# Patient Record
Sex: Male | Born: 1950 | Race: White | Hispanic: No | Marital: Single | Smoking: Never smoker
Health system: Southern US, Community
[De-identification: ages and names within clinical notes are randomized; demographics above are authoritative.]

## PROBLEM LIST (undated history)

## (undated) DIAGNOSIS — E785 Hyperlipidemia, unspecified: Secondary | ICD-10-CM

## (undated) DIAGNOSIS — G252 Other specified forms of tremor: Secondary | ICD-10-CM

## (undated) DIAGNOSIS — F32A Depression, unspecified: Secondary | ICD-10-CM

## (undated) DIAGNOSIS — R251 Tremor, unspecified: Secondary | ICD-10-CM

## (undated) DIAGNOSIS — F419 Anxiety disorder, unspecified: Secondary | ICD-10-CM

## (undated) HISTORY — DX: Hyperlipidemia, unspecified: E78.5

## (undated) HISTORY — PX: FOOT TENDON SURGERY: SHX958

## (undated) HISTORY — DX: Other specified forms of tremor: G25.2

## (undated) HISTORY — DX: Anxiety disorder, unspecified: F41.9

## (undated) HISTORY — PX: APPENDECTOMY: SHX54

## (undated) HISTORY — DX: Tremor, unspecified: R25.1

## (undated) HISTORY — PX: TONSILLECTOMY: SUR1361

## (undated) HISTORY — DX: Depression, unspecified: F32.A

---

## 2012-10-26 DIAGNOSIS — G4700 Insomnia, unspecified: Secondary | ICD-10-CM | POA: Insufficient documentation

## 2012-12-03 DIAGNOSIS — I4510 Unspecified right bundle-branch block: Secondary | ICD-10-CM | POA: Insufficient documentation

## 2012-12-03 HISTORY — DX: Unspecified right bundle-branch block: I45.10

## 2012-12-22 DIAGNOSIS — R972 Elevated prostate specific antigen [PSA]: Secondary | ICD-10-CM | POA: Insufficient documentation

## 2012-12-22 HISTORY — DX: Elevated prostate specific antigen (PSA): R97.20

## 2013-08-15 DIAGNOSIS — I5189 Other ill-defined heart diseases: Secondary | ICD-10-CM

## 2013-08-15 DIAGNOSIS — I340 Nonrheumatic mitral (valve) insufficiency: Secondary | ICD-10-CM

## 2013-08-15 HISTORY — DX: Other ill-defined heart diseases: I51.89

## 2013-08-15 HISTORY — DX: Nonrheumatic mitral (valve) insufficiency: I34.0

## 2013-09-30 DIAGNOSIS — K5730 Diverticulosis of large intestine without perforation or abscess without bleeding: Secondary | ICD-10-CM

## 2013-09-30 HISTORY — DX: Diverticulosis of large intestine without perforation or abscess without bleeding: K57.30

## 2015-03-14 DIAGNOSIS — E538 Deficiency of other specified B group vitamins: Secondary | ICD-10-CM | POA: Insufficient documentation

## 2015-03-14 DIAGNOSIS — E559 Vitamin D deficiency, unspecified: Secondary | ICD-10-CM | POA: Insufficient documentation

## 2015-04-13 DIAGNOSIS — G4733 Obstructive sleep apnea (adult) (pediatric): Secondary | ICD-10-CM | POA: Insufficient documentation

## 2015-04-13 HISTORY — DX: Obstructive sleep apnea (adult) (pediatric): G47.33

## 2016-06-22 DIAGNOSIS — N200 Calculus of kidney: Secondary | ICD-10-CM | POA: Insufficient documentation

## 2016-06-22 HISTORY — DX: Calculus of kidney: N20.0

## 2017-08-31 DIAGNOSIS — M1710 Unilateral primary osteoarthritis, unspecified knee: Secondary | ICD-10-CM | POA: Insufficient documentation

## 2017-08-31 HISTORY — DX: Unilateral primary osteoarthritis, unspecified knee: M17.10

## 2018-12-15 DIAGNOSIS — E785 Hyperlipidemia, unspecified: Secondary | ICD-10-CM | POA: Insufficient documentation

## 2018-12-15 DIAGNOSIS — E7800 Pure hypercholesterolemia, unspecified: Secondary | ICD-10-CM | POA: Insufficient documentation

## 2018-12-15 HISTORY — DX: Hyperlipidemia, unspecified: E78.5

## 2018-12-15 HISTORY — DX: Pure hypercholesterolemia, unspecified: E78.00

## 2019-03-19 DIAGNOSIS — N1831 Chronic kidney disease, stage 3a: Secondary | ICD-10-CM | POA: Insufficient documentation

## 2019-03-19 DIAGNOSIS — N183 Chronic kidney disease, stage 3 unspecified: Secondary | ICD-10-CM | POA: Insufficient documentation

## 2019-03-19 HISTORY — DX: Chronic kidney disease, stage 3a: N18.31

## 2019-07-30 DIAGNOSIS — M160 Bilateral primary osteoarthritis of hip: Secondary | ICD-10-CM

## 2019-07-30 HISTORY — DX: Bilateral primary osteoarthritis of hip: M16.0

## 2019-11-22 ENCOUNTER — Telehealth: Payer: Self-pay | Admitting: Hematology and Oncology

## 2019-11-22 NOTE — Telephone Encounter (Signed)
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 Richard Wilson has been cld and scheduled to see Dr. Lorenso Courier on 11/18 at 9am. I sent a link to the pt's email to sign up for Mychart.

## 2019-12-19 NOTE — Progress Notes (Signed)
No show

## 2019-12-20 ENCOUNTER — Inpatient Hospital Stay: Payer: Medicare Other | Attending: Hematology and Oncology | Admitting: Hematology and Oncology

## 2019-12-20 ENCOUNTER — Inpatient Hospital Stay: Payer: Medicare Other

## 2019-12-20 DIAGNOSIS — E538 Deficiency of other specified B group vitamins: Secondary | ICD-10-CM

## 2019-12-20 DIAGNOSIS — D500 Iron deficiency anemia secondary to blood loss (chronic): Secondary | ICD-10-CM

## 2019-12-20 DIAGNOSIS — D751 Secondary polycythemia: Secondary | ICD-10-CM

## 2019-12-21 ENCOUNTER — Encounter: Payer: Self-pay | Admitting: Physician Assistant

## 2019-12-21 ENCOUNTER — Ambulatory Visit (INDEPENDENT_AMBULATORY_CARE_PROVIDER_SITE_OTHER): Payer: Medicare Other | Admitting: Physician Assistant

## 2019-12-21 ENCOUNTER — Other Ambulatory Visit: Payer: Self-pay

## 2019-12-21 DIAGNOSIS — F330 Major depressive disorder, recurrent, mild: Secondary | ICD-10-CM | POA: Diagnosis not present

## 2019-12-21 DIAGNOSIS — F411 Generalized anxiety disorder: Secondary | ICD-10-CM | POA: Diagnosis not present

## 2019-12-21 DIAGNOSIS — R6889 Other general symptoms and signs: Secondary | ICD-10-CM

## 2019-12-21 DIAGNOSIS — R37 Sexual dysfunction, unspecified: Secondary | ICD-10-CM | POA: Diagnosis not present

## 2019-12-21 MED ORDER — SERTRALINE HCL 25 MG PO TABS: ORAL_TABLET | ORAL | 0 refills | Status: DC

## 2019-12-21 NOTE — Patient Instructions (Signed)
On the Zoloft 25 mg, take 3 pills a day for 1 week then take 2 pills a day for 1 week then take 1 pill a day for 1 week and then stop

## 2019-12-21 NOTE — Progress Notes (Signed)
[facility] MD/PA/NP Initial Note  12/21/2019 6:06 PM Richard Wilson  MRN:  [id number]  Chief Complaint:  Chief Complaint    Establish Care      HPI:   He has recently moved to [facility] and is here to establish care.  He would like to streamline his medications as well.  Many of the psychiatric medications are once he has been on for a while and he does not even know what he takes them for.  So he is unsure if they helped or not.  He has a couple of different problems he would like help with.  First of all is the fact that he has very vivid dreams.  They are not really nightmares but sometimes he does not know where the dream ends and reality but tends when he wakes up.  If he is able to go back to sleep pretty fast, he goes right back into that same dream to finish it.  He is unable to tell me how long this has been going on, but states quite a while. Another problem is sexual dysfunction.  He has trouble with erection, and orgasm.  He is unsure if this is due to any other medications, his age, or both.  He has a longstanding history of anxiety and depression.  He has been on disability since 2001 for severe anxiety.  He has a history of suicide attempt on 2 separate occasions.  See below.  He has been hospitalized as well, for these incidents.    States he is able to enjoy things.  Energy and motivation are pretty good for him.  He does not sleep well but it is due to the dreams or at least he thinks so.  He does nap during the day.  Appetite is normal.  No reports of weight gain or weight loss.  He does not cry easily.  Not isolating.  At this time, he denies suicidal or homicidal thoughts.  He has never had any manic symptoms.  There have been a few times in his life where he was more energetic than his norm.  Those times were directly related with happy circumstances or something like that. Patient denies increased energy with decreased need for sleep, no increased talkativeness, no racing  thoughts, no impulsivity or risky behaviors, no increased spending, no increased libido, no grandiosity, no increased irritability or anger, and no hallucinations.  Visit Diagnosis:    ICD-10-CM   1. 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 Sexual dysfunction  R37   4. Vivid dream  R68.89     Past Psychiatric History:   Past medications for mental health diagnoses include: Rozerem, Trazodone caused vivid dreams, Xanax, Buspar  1996 tried to commit suicide with pills, admitted in [place], for a week. 2015 SI, hosp [facility], then psych hospital in [place]  He has seen Dr. Corene Cornea Master, Psychiatrist, in [place].  Past Medical History:  Past Medical History:  Diagnosis Date  . Anxiety   . Depression   . Tremor     Past Surgical History:  Procedure Laterality Date  . APPENDECTOMY    . TONSILLECTOMY      Family Psychiatric History: see below  Family History:  Family History  Problem Relation Age of Onset  . Healthy Mother   . Prostate cancer Father   . Throat cancer Maternal Grandfather   . Heart attack Paternal Grandfather  Social History:  Social History   Socioeconomic History  . Marital status: Unknown    Spouse name: Not on file  . Number of children: Not on file  . Years of education: Not on file  . Highest education level: Not on file  Occupational History  . Occupation: Disability    Comment: 2001 for GAD  . Occupation: Librarian  Tobacco Use  . Smoking status: Never Smoker  . Smokeless tobacco: Never Used  Substance and Sexual Activity  . Alcohol use: Yes    Alcohol/week: 3.0 standard drinks    Types: 3 Glasses of wine per week  . Drug use: Never  . Sexual activity: Not on file  Other Topics Concern  . Not on file  Social History Narrative   Grew up in [place]. Had a good childhood. Never abused.    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    Never married. No kids. Is gay.      No legal trouble.   [place]   Social Determinants of Health   Financial Resource Strain:   . Difficulty of Paying Living Expenses: Not on file  Food Insecurity:   . Worried About Charity fundraiser in the Last Year: Not on file  . Ran Out of Food in the Last Year: Not on file  Transportation Needs:   . Lack of Transportation (Medical): Not on file  . Lack of Transportation (Non-Medical): Not on file  Physical Activity:   . Days of Exercise per Week: Not on file  . Minutes of Exercise per Session: Not on file  Stress:   . Feeling of Stress : Not on file  Social Connections:   . Frequency of Communication with Friends and Family: Not on file  . Frequency of Social Gatherings with Friends and Family: Not on file  . Attends Religious Services: Not on file  . Active Member of Clubs or Organizations: Not on file  . Attends Archivist Meetings: Not on file  . Marital Status: Not on file    Allergies:  Allergies  Allergen Reactions  . Garamycin [Gentamicin] Other (See Comments)    Eyes itched and were edematous  . Penicillins     Metabolic Disorder Labs: No results found for: HGBA1C, MPG No results found for: PROLACTIN No results found for: CHOL, TRIG, HDL, CHOLHDL, VLDL, LDLCALC No results found for: TSH  Therapeutic Level Labs: No results found for: LITHIUM No results found for: VALPROATE No components found for:  CBMZ  Current Medications: Current Outpatient Medications  Medication Sig Dispense Refill  . ARIPiprazole (ABILIFY) 5 MG tablet Take 5 mg by mouth daily.    . clonazePAM (KLONOPIN) 1 MG tablet Take 1 mg by mouth 3 (three) times daily as needed for anxiety.    . lamoTRIgine (LAMICTAL) 100 MG tablet Take 100 mg by mouth daily.    . pravastatin (PRAVACHOL) 40 MG tablet Take 40 mg by mouth daily.    . propranolol (INDERAL) 20 MG tablet Take 20 mg by mouth 3 (three) times  daily as needed.    . ramelteon (ROZEREM) 8 MG tablet Take 8 mg by mouth at bedtime. (Patient not taking: Reported on 12/21/2019)    . sertraline (ZOLOFT) 25 MG tablet 3 po qd for 1 week, then 2 po qd for 1 week, then 1 po qd for 1 week, then stop. 42 tablet 0   No current facility-administered  medications for this visit.    Medication Side Effects: sexual dysfunction and vivid dreams  Orders placed this visit:  No orders of the defined types were placed in this encounter.   Psychiatric Specialty Exam:  Review of Systems  Constitutional: Negative.   HENT: Negative.   Eyes: Negative.   Respiratory: Negative.   Cardiovascular: Negative.   Gastrointestinal: Negative.   Endocrine: Negative.   Genitourinary: Negative.   Musculoskeletal: Positive for myalgias.  Skin: Negative.   Allergic/Immunologic: Negative.   Neurological: Positive for tremors.       Has been diagnosed with tremor in the past.  Cogentin has been helpful.  He is not sure what the tremor is caused from.  Hematological: Negative.   Psychiatric/Behavioral: Positive for sleep disturbance. The patient is nervous/anxious.     There were no vitals taken for this visit.There is no height or weight on file to calculate BMI.  General Appearance: Casual, Neat and Well Groomed  Eye Contact:  Good  Speech:  Clear and Coherent and Normal Rate  Volume:  Normal  Mood:  Euthymic  Affect:  Appropriate  Thought Process:  Goal Directed and Descriptions of Associations: Intact  Orientation:  Full (Time, Place, and Person)  Thought Content: Logical   Suicidal Thoughts:  No  Homicidal Thoughts:  No  Memory:  WNL  Judgement:  Good  Insight:  Good  Psychomotor Activity:  Normal  Concentration:  Concentration: Good  Recall:  Good  Fund of Knowledge: Good  Language: Good  Assets:  Desire for Improvement  ADL's:  Intact  Cognition: WNL  Prognosis:  Good   Screenings:  GAD-7     Office Visit from 12/21/2019 in [facility]  Total GAD-7 Score 8    PHQ2-9     Office Visit from 12/21/2019 in [facility]  PHQ-2 Total Score 1      Receiving Psychotherapy: Yes   Dr. Timmothy Sours Poag   Treatment Plan/Recommendations:  PDMP was reviewed. I provided 70 minutes of face-to-face time during this encounter. We discussed several different issues.  First of all, the Zoloft can cause the vivid dreams as well as sexual dysfunction.  Since he is not sure it is helping and is definitely contributing to these issues, he would like to get off of it.  He will need to wean off.  These directions were typed out on his AVS.  They will also be on the prescription bottle. He will sign a release of information today so I can get his previous psychiatrist notes. Sleep hygiene was discussed.  I recommend he take Klonopin before bedtime.  Avoid caffeine after lunch.  If after weaning off the Zoloft and having tried a Klonopin about 1 hour before bedtime he is still unable to sleep, call and I will send in a prescription for mirtazapine. Wean off Zoloft 25 mg, 3 p.o. daily for 1 week then 2 p.o. daily for 1 week then 1 p.o. daily for 1 week then stop. Continue Abilify 5 mg 1 every morning.  He may or may not need this and we will discuss weaning off that at the next visit.  I do not want to make too many changes at 1 time. Continue Klonopin 1 mg, 1 p.o. 3 times daily as needed anxiety. Continue Lamictal 100 mg, 1 p.o. daily. Continue propranolol 20 mg, 1 p.o. 3 times daily as needed, which he is using mostly for the tremor. If he does stay on Abilify, I will order  labs at the next visit. Continue therapy with Dr. Myra Gianotti.  Return in 6 weeks.  Donnal Moat, PA-C

## 2019-12-25 ENCOUNTER — Telehealth: Payer: Self-pay | Admitting: Physician Assistant

## 2019-12-25 NOTE — Telephone Encounter (Signed)
Pt called to let you know that the Klonopin 1mg  at bedtime does not work. Pt wants to know his options.

## 2019-12-25 NOTE — Telephone Encounter (Signed)
Please review

## 2019-12-26 ENCOUNTER — Other Ambulatory Visit: Payer: Self-pay | Admitting: Physician Assistant

## 2019-12-26 MED ORDER — MIRTAZAPINE 7.5 MG PO TABS
7.5000 mg | ORAL_TABLET | Freq: Every evening | ORAL | 0 refills | Status: DC | PRN
Start: 2019-12-26 — End: 2020-01-01

## 2019-12-26 NOTE — Telephone Encounter (Signed)
Please let him know that I sent in the mirtazapine as we had discussed at the office visit.  Sent to the [facility] on [address].  He should take it as needed.

## 2019-12-26 NOTE — Telephone Encounter (Signed)
Patient aware and actually just picked up Rx.

## 2020-01-01 ENCOUNTER — Other Ambulatory Visit: Payer: Self-pay

## 2020-01-01 ENCOUNTER — Encounter: Payer: Self-pay | Admitting: Physician Assistant

## 2020-01-01 ENCOUNTER — Ambulatory Visit (INDEPENDENT_AMBULATORY_CARE_PROVIDER_SITE_OTHER): Payer: Medicare Other | Admitting: Physician Assistant

## 2020-01-01 DIAGNOSIS — F411 Generalized anxiety disorder: Secondary | ICD-10-CM | POA: Insufficient documentation

## 2020-01-01 DIAGNOSIS — R37 Sexual dysfunction, unspecified: Secondary | ICD-10-CM | POA: Diagnosis not present

## 2020-01-01 DIAGNOSIS — R6889 Other general symptoms and signs: Secondary | ICD-10-CM

## 2020-01-01 DIAGNOSIS — F330 Major depressive disorder, recurrent, mild: Secondary | ICD-10-CM | POA: Insufficient documentation

## 2020-01-01 DIAGNOSIS — F329 Major depressive disorder, single episode, unspecified: Secondary | ICD-10-CM | POA: Insufficient documentation

## 2020-01-01 HISTORY — DX: Generalized anxiety disorder: F41.1

## 2020-01-01 HISTORY — DX: Major depressive disorder, single episode, unspecified: F32.9

## 2020-01-01 MED ORDER — SERTRALINE HCL 100 MG PO TABS: 150.0000 mg | ORAL_TABLET | Freq: Every day | ORAL | 0 refills | Status: DC

## 2020-01-01 MED ORDER — TRAZODONE HCL 150 MG PO TABS: 150.0000 mg | ORAL_TABLET | Freq: Every evening | ORAL | 0 refills | Status: DC | PRN

## 2020-01-01 NOTE — Progress Notes (Signed)
[facility] Med Check  Patient ID: Richard Wilson,  MRN: [id number]  PCP: Clare Charon., MD  Date of Evaluation: 01/01/2020 Time spent:30 minutes  Chief Complaint:  Chief Complaint    Anxiety; Depression; Insomnia      HISTORY/CURRENT STATUS: HPI Needs to discuss med issues.  At [place], we discussed going off Zoloft d/t sexual SE. "It's not worth it. I've felt awful." The he prefers to stay on the Zoloft even with the side effects of vivid dreams and sexual dysfunction.   The Zoloft has helped the depression.  He is able to enjoy things.  Energy and motivation are good.  He is not isolating.  Appetite is normal.  He is sleeping better since he went back on the trazodone that he had at home.  He was not able to tolerate the mirtazapine due to extreme fatigue and somnolence the next day.  Denies suicidal or homicidal thoughts.  He is still having some anxiety but it is really about the same.  He has taken Xanax in the past and states it did work quicker but not sure how long the improvement lasted.  He is not having panic attacks but more of generalized sense of anxiety.  He has been on a benzo since 2005.  Patient denies increased energy with decreased need for sleep, no increased talkativeness, no racing thoughts, no impulsivity or risky behaviors, no increased spending, no increased libido, no grandiosity, no increased irritability or anger, and no hallucinations.  Denies dizziness, syncope, seizures, numbness, tingling, tremor, tics, unsteady gait, slurred speech, confusion. Denies dizziness, syncope, seizures, numbness, tingling, tremor, tics, unsteady gait, slurred speech, confusion.   Individual Medical History/ Review of Systems: Changes? :No    Past medications for mental health diagnoses include: Rozerem, Trazodone caused vivid dreams, Xanax, Buspar, mirtazapine caused excessive somnolence and fatigue with flulike symptoms.  Allergies: Garamycin [gentamicin] and  Penicillins  Current Medications:  Current Outpatient Medications:    ARIPiprazole (ABILIFY) 5 MG tablet, Take 5 mg by mouth daily., Disp: , Rfl:    clonazePAM (KLONOPIN) 1 MG tablet, Take 1 mg by mouth 3 (three) times daily as needed for anxiety., Disp: , Rfl:    lamoTRIgine (LAMICTAL) 100 MG tablet, Take 100 mg by mouth daily., Disp: , Rfl:    propranolol (INDERAL) 20 MG tablet, Take 20 mg by mouth 3 (three) times daily as needed., Disp: , Rfl:    pravastatin (PRAVACHOL) 40 MG tablet, Take 40 mg by mouth daily. (Patient not taking: Reported on 01/01/2020), Disp: , Rfl:    ramelteon (ROZEREM) 8 MG tablet, Take 8 mg by mouth at bedtime. (Patient not taking: Reported on 12/21/2019), Disp: , Rfl:    sertraline (ZOLOFT) 100 MG tablet, Take 1.5 tablets (150 mg total) by mouth daily., Disp: 135 tablet, Rfl: 0   traZODone (DESYREL) 150 MG tablet, Take 1 tablet (150 mg total) by mouth at bedtime as needed for sleep., Disp: 90 tablet, Rfl: 0 Medication Side Effects: hypersomnolence and fatigue and flu-like sx  Family Medical/ Social History: Changes?  No  MENTAL HEALTH EXAM:  There were no vitals taken for this visit.There is no height or weight on file to calculate BMI.  General Appearance: Casual, Neat and Well Groomed  Eye Contact:  Good  Speech:  Clear and Coherent and Normal Rate  Volume:  Normal  Mood:  Euthymic  Affect:  Appropriate  Thought Process:  Goal Directed and Descriptions of Associations: Intact  Orientation:  Full (Time, Place, and Person)  Thought Content: Logical   Suicidal Thoughts:  No  Homicidal Thoughts:  No  Memory:  WNL  Judgement:  Good  Insight:  Good  Psychomotor Activity:  Normal  Concentration:  Concentration: Good  Recall:  Good  Fund of Knowledge: Good  Language: Good  Assets:  Desire for Improvement  ADL's:  Intact  Cognition: WNL  Prognosis:  Good    DIAGNOSES:    ICD-10-CM   1. Generalized anxiety disorder  F41.1   2. Mild episode of  recurrent major depressive disorder (HCC)  F33.0   3. Sexual dysfunction  R37   4. Vivid dream  R68.89     Receiving Psychotherapy: Yes  He is considering changing.  Now seeing Dr. Timmothy Sours Poag who he has only seen a few times.   RECOMMENDATIONS:  PDMP reviewed.  I provided 30 minutes of face-to-face time during this encounter. We discussed changing Klonopin to Xanax.  He is a bit nervous to do that.  At one point in the past, someone wean him off the Klonopin, but did not add another benzo.  He feels like he was weaned too fast.  He had shakes, felt extremely nervous inside, and felt horrible for several months.  He understands that if we changed to Xanax it will be okay to stop the Klonopin and go directly to the Xanax.  Xanax is faster acting but the effects do not last as long as Klonopin.  We do need to consider changing because the Klonopin is not as effective as it used to be.  However I only want to make 1 change at a time.  I think it would be more beneficial to increase the Zoloft to help prevent the anxiety.  He would like to try that.  Of course he understands that any benzo can increase the risk of falls in the elderly.  Because he has been on a benzo for 16 years at least, I do not think it will be possible to wean him off, at least not for now. Increase Zoloft to 150 mg daily. Continue Klonopin 1 mg, 1 p.o. 3 times daily as needed. Continue Abilify 5 mg every morning. Continue Lamictal 100 mg, 1 p.o. daily. Continue trazodone 150 mg, 1 p.o. nightly as needed. Continue therapy either with Dr. Myra Gianotti or Dr. Junie Bame was recommended and he does accept Medicare to my knowledge. Return in 4 weeks.  Donnal Moat, PA-C

## 2020-01-14 ENCOUNTER — Ambulatory Visit: Payer: Medicare Other | Admitting: Mental Health

## 2020-01-14 ENCOUNTER — Telehealth: Payer: Self-pay | Admitting: Physician Assistant

## 2020-01-14 NOTE — Telephone Encounter (Signed)
Patient's next visit is 02/04/20. Would like to go back on Xanax as this works better for his anxiety. Call to [facility] at [address], [place], [place]. [phone].

## 2020-01-15 ENCOUNTER — Telehealth: Payer: Self-pay | Admitting: Physician Assistant

## 2020-01-15 NOTE — Telephone Encounter (Signed)
Richard Wilson called back this morning to follow up on call from yesterday.  He is wanting to know your opinion on what he is asking about the Xanax.  And if you agree.  Please let him know if you think this would be helpful for him.

## 2020-01-15 NOTE — Telephone Encounter (Signed)
Would you mind calling him, I'll send in Xanax but he will need to bring the remainder of bottle of Klonopin before I can. We have to dispose of it properly. Thanks.

## 2020-01-16 ENCOUNTER — Other Ambulatory Visit: Payer: Self-pay | Admitting: Physician Assistant

## 2020-01-16 MED ORDER — ALPRAZOLAM 1 MG PO TABS: 1.0000 mg | ORAL_TABLET | Freq: Three times a day (TID) | ORAL | 0 refills | Status: DC | PRN

## 2020-01-16 NOTE — Telephone Encounter (Signed)
He did bring the bottle of Klonopin for [place] to dispose of properly.  Prescription for Xanax was sent to his pharmacy.

## 2020-01-16 NOTE — Telephone Encounter (Signed)
See previous phone note.  

## 2020-01-17 ENCOUNTER — Ambulatory Visit: Payer: Medicare Other | Admitting: Psychiatry

## 2020-01-17 NOTE — Progress Notes (Signed)
Assessment/Plan:    1.Parkinsonism  -I had a long counseling session with the patient today.  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  I explained that one clinically cannot tell the difference between idiopathic parkinsons disease and secondary parkinsonism from medication.  I also explained that even if one is able to get off of the medication, it can take up to 6 months to clinically definitively know if this is idiopathic parkinsons disease.  I did not advise that the patient go off of medication, as this needs to be discussed with the patients prescribing clinician.  I did, however, tell the patient that the longer one is on the medication, the worse the symptoms can get.  The patient is to make an appointment with [facility] to discuss what I have discussed with him.   Subjective:   Richard Wilson was seen today in the movement disorders clinic for neurologic consultation at the request of Johna Roles, [place].  The consultation is for the evaluation of intention tremor and cogwheel rigidity.  Records indicate that referring clinician suspected Parkinsons Disease vs chronic benzo use.   Specific Symptoms:  Tremor: Yes.   x 2-3 years.  Pt attributes to being taken off of klonopin abruptly - started back on klonopin without help and just recently the PA from behavioral med changed him to xanax, 1 mg tid.  Tremor is in the bilateral UE, R>L - both at rest and with activation.  Definitely notes it with pouring something from jug/pitcher.  Some tremor of the R leg when driving. Family hx of similar:  Yes.  , cousin with PD Voice: no but seems to cough a lot Sleep: sleeps well  Vivid Dreams:  Yes.    Acting out dreams:  No. Wet Pillows: No. Postural symptoms:  No.  Falls?  Fell Saturday after tripping on a shoe Bradykinesia symptoms: shuffling gait and difficulty regaining balance Loss of smell:  No. Loss of taste:  No. Urinary  Incontinence:  No. Difficulty Swallowing:  No. Handwriting, micrographia: Yes.   Trouble with ADL's:  No.  Trouble buttoning clothing: Yes.   , some trouble d/t dexterity Depression:  No. but admits to anxiety Memory changes:  Yes.   Hallucinations:  No.  visual distortions: Yes.  , occasional  N/V:  No. Lightheaded:  No.  Syncope: No. Diplopia:  No. Dyskinesia:  No. Prior exposure to reglan/antipsychotics: Yes.  , on abilify x 4-5 years.  Appears that he has been on cogentin in the past per records for tx of tremor  Neuroimaging of the brain has not previously been performed.     ALLERGIES:   Allergies  Allergen Reactions  . Garamycin [Gentamicin] Other (See Comments)    Eyes itched and were edematous  . Penicillins     CURRENT MEDICATIONS:  Current Outpatient Medications  Medication Instructions  . ALPRAZolam (XANAX) 1 mg, Oral, 3 times daily PRN  . ARIPiprazole (ABILIFY) 5 mg, Oral, Daily  . lamoTRIgine (LAMICTAL) 100 mg, Oral, Daily  . pravastatin (PRAVACHOL) 40 mg, Oral, Daily  . Probiotic Product (PROBIOTIC-10 PO) 1 tablet, Oral, Daily  . sertraline (ZOLOFT) 150 mg, Oral, Daily  . traZODone (DESYREL) 150 mg, Oral, At bedtime PRN    Objective:   VITALS:   Vitals:   01/21/20 0923  BP: 104/74  Pulse: 70  SpO2: 98%  Weight: 196 lb (88.9 kg)  Height: 5\' 9"  (1.753 m)    GEN:  The patient appears stated age and is in NAD. HEENT:  Normocephalic, atraumatic.  The mucous membranes are moist. The superficial temporal arteries are without ropiness or tenderness. CV:  RRR Lungs:  CTAB Neck/HEME:  There are no carotid bruits bilaterally.  Neurological examination:  Orientation: The patient is alert and oriented x3.  Cranial nerves: There is good facial symmetry. Extraocular muscles are intact. The visual fields are full to confrontational testing. The speech is fluent and clear. Soft palate rises symmetrically and there is no tongue deviation. Hearing is intact to  conversational tone. Sensation: Sensation is intact to light and pinprick throughout (facial, trunk, extremities). Vibration is intact at the bilateral big toe. There is no extinction with double simultaneous stimulation. There is no sensory dermatomal level identified. Motor: Strength is 5/5 in the bilateral upper and lower extremities.   Shoulder shrug is equal and symmetric.  There is no pronator drift. Deep tendon reflexes: Deep tendon reflexes are 2-/4 at the bilateral biceps, triceps, brachioradialis, patella and achilles. Plantar responses are downgoing bilaterally.  Movement examination: Tone: There is nltone in the bilateral upper extremities.  The tone in the lower extremities is nl.  Abnormal movements: there is bilateral lower extremity rest tremor, L>R that increases with distraction Coordination:  There is  decremation with RAM's, with finger taps on the R and toe and heel taps on the L Gait and Station: The patient has no difficulty arising out of a deep-seated chair without the use of the hands. The patient's stride length is good.    Total time spent on today's visit was 45 minutes, including both face-to-face time and nonface-to-face time.  Time included that spent on review of records (prior notes available to me/labs/imaging if pertinent), discussing treatment and goals, answering patient's questions and coordinating care.  Cc:  [name], [name], [place]

## 2020-01-21 ENCOUNTER — Ambulatory Visit (INDEPENDENT_AMBULATORY_CARE_PROVIDER_SITE_OTHER): Payer: Medicare Other | Admitting: Neurology

## 2020-01-21 ENCOUNTER — Other Ambulatory Visit: Payer: Self-pay

## 2020-01-21 ENCOUNTER — Encounter: Payer: Self-pay | Admitting: Neurology

## 2020-01-21 VITALS — BP 104/74 | HR 70 | Ht 69.0 in | Wt 196.0 lb

## 2020-01-21 DIAGNOSIS — G2111 Neuroleptic induced parkinsonism: Secondary | ICD-10-CM

## 2020-01-21 NOTE — Patient Instructions (Signed)
Make an appointment with [facility] to discuss whether or not there are alternatives to your abilify.  I will send them a note today.  Happy Holidays!  The physicians and staff at [facility] are committed to providing excellent care. You may receive a survey requesting feedback about your experience at our office. We strive to receive "very good" responses to the survey questions. If you feel that your experience would prevent you from giving the office a "very good " response, please contact our office to try to remedy the situation. We may be reached at [phone]. Thank you for taking the time out of your busy day to complete the survey.

## 2020-01-22 ENCOUNTER — Ambulatory Visit: Payer: Medicare Other | Admitting: Mental Health

## 2020-01-30 ENCOUNTER — Telehealth: Payer: Self-pay | Admitting: Physician Assistant

## 2020-01-30 NOTE — Telephone Encounter (Signed)
Pt LM on VM stating he would like to increase the mg on Xanax or take more often during the day. Contact # [phone]. Apt 1/3

## 2020-01-31 NOTE — Telephone Encounter (Signed)
Pt called checking status on message left on v-mail 12/29. Pt has apt Monday 1/3. Pt was advised, I will touch base with provider and advise if provider would like to discuss @ f/u 1/3. Please advise, so I can advise Pt.

## 2020-01-31 NOTE — Telephone Encounter (Signed)
ok 

## 2020-01-31 NOTE — Telephone Encounter (Signed)
Provider will discuss @ apt 1/3. Pt advised

## 2020-01-31 NOTE — Telephone Encounter (Signed)
Please let him know I would prefer to discuss this with him at his appointment in a few days because he is already on a high dose of Xanax. Thanks.

## 2020-02-04 ENCOUNTER — Other Ambulatory Visit: Payer: Self-pay

## 2020-02-04 ENCOUNTER — Ambulatory Visit (INDEPENDENT_AMBULATORY_CARE_PROVIDER_SITE_OTHER): Payer: Medicare Other | Admitting: Physician Assistant

## 2020-02-04 ENCOUNTER — Encounter: Payer: Self-pay | Admitting: Physician Assistant

## 2020-02-04 DIAGNOSIS — G251 Drug-induced tremor: Secondary | ICD-10-CM

## 2020-02-04 DIAGNOSIS — F411 Generalized anxiety disorder: Secondary | ICD-10-CM | POA: Diagnosis not present

## 2020-02-04 DIAGNOSIS — F330 Major depressive disorder, recurrent, mild: Secondary | ICD-10-CM

## 2020-02-04 DIAGNOSIS — R37 Sexual dysfunction, unspecified: Secondary | ICD-10-CM | POA: Diagnosis not present

## 2020-02-04 MED ORDER — SERTRALINE HCL 100 MG PO TABS: 200.0000 mg | ORAL_TABLET | Freq: Every day | ORAL | 0 refills | Status: DC

## 2020-02-04 MED ORDER — CLONAZEPAM 1 MG PO TABS: 1.0000 mg | ORAL_TABLET | Freq: Three times a day (TID) | ORAL | 1 refills | Status: DC | PRN

## 2020-02-04 MED ORDER — TRAZODONE HCL 150 MG PO TABS: 75.0000 mg | ORAL_TABLET | Freq: Every evening | ORAL | 0 refills | Status: DC | PRN

## 2020-02-04 NOTE — Progress Notes (Unsigned)
[facility]  Patient ID: Richard Wilson,  MRN: [id number]  PCP: Delma Officer, PA  Date of Evaluation: 02/04/2020 Time spent:40 minutes  Chief Complaint:  Chief Complaint    Anxiety; Depression; Insomnia      HISTORY/CURRENT STATUS: HPI For routine med check.  Since LOV, he saw Dr. Arbutus Leas, neurologist, for the tremor. She feels that the tremor is from the Abilify. He states the Abilify has helped him a lot and doesn't want to go off it. "I can deal with the tremor. It doesn't bother me that bad."  Has been a bit more anxious lately.  He does not feel like the Xanax is helping nearly as much as the Klonopin did.  It wears off a lot sooner.  Wonders if we can change back to the Klonopin.  He is not really having panic attacks but he gets very jittery and nervous feeling inside when he does not take the Xanax.  He can tell when it wears off and he is taking it routinely 3 times a day.  Also depressed too. He's terribly lonely. Alone all of the time. Has changed therapists and it's going well. Enjoys talking with the new therapist very much. He's able to enjoy things when he has something to do, energy and motivation are good, memory and focus are good and at his baseline, doesn't cry easily, not purposely isolating, no SI/HI.   Patient denies increased energy with decreased need for sleep, no increased talkativeness, no racing thoughts, no impulsivity or risky behaviors, no increased spending, no increased libido, no grandiosity, no increased irritability or anger, and no hallucinations.  Denies dizziness, syncope, seizures, numbness, tingling, tremor, tics, unsteady gait, slurred speech, confusion. Denies dizziness, syncope, seizures, numbness, tingling, tremor, tics, unsteady gait, slurred speech, confusion.   Individual Medical History/ Review of Systems: Changes? :No    Past medications for mental health diagnoses include: Rozerem, Trazodone caused vivid dreams, Xanax,  Buspar, mirtazapine caused excessive somnolence and fatigue with flulike symptoms, Belsomra, propranolol for tremor, Zoloft, Abilify, Klonopin, Effexor, Ativan, Wellbutrin, Nardil, lithium, gabapentin, Librium  Pertinent info after reviewing Dr. Marjie Skiff notes, his previous psychiatrist in [facility] Past suicide attempt in September 1996, by taking Tylenol.  He was admitted at that time to a psychiatric hospital in [facility].  Admitted to psych hospital multiple times in the past for "nervous breakdowns."  January 1985, February 1986, October 1991, September 1996, July 2000.  Allergies: Garamycin [gentamicin] and Penicillins  Current Medications:  Current Outpatient Medications:  .  ARIPiprazole (ABILIFY) 5 MG tablet, Take 5 mg by mouth daily., Disp: , Rfl:  .  clonazePAM (KLONOPIN) 1 MG tablet, Take 1 tablet (1 mg total) by mouth 3 (three) times daily as needed for anxiety., Disp: 90 tablet, Rfl: 1 .  lamoTRIgine (LAMICTAL) 100 MG tablet, Take 100 mg by mouth daily., Disp: , Rfl:  .  pravastatin (PRAVACHOL) 40 MG tablet, Take 40 mg by mouth daily. (Patient not taking: Reported on 02/04/2020), Disp: , Rfl:  .  Probiotic Product (PROBIOTIC-10 PO), Take 1 tablet by mouth daily. (Patient not taking: Reported on 02/04/2020), Disp: , Rfl:  .  sertraline (ZOLOFT) 100 MG tablet, Take 2 tablets (200 mg total) by mouth daily., Disp: 180 tablet, Rfl: 0 .  traZODone (DESYREL) 150 MG tablet, Take 0.5 tablets (75 mg total) by mouth at bedtime as needed for sleep., Disp: 90 tablet, Rfl: 0 Medication Side Effects: none  Family Medical/ Social History: Changes?  No  MENTAL HEALTH EXAM:  There were no vitals taken for this visit.There is no height or weight on file to calculate BMI.  General Appearance: Casual, Neat and Well Groomed  Eye Contact:  Good  Speech:  Clear and Coherent and Normal Rate  Volume:  Normal  Mood:  Euthymic  Affect:  Appropriate  Thought Process:   Goal Directed and Descriptions of Associations: Intact  Orientation:  Full (Time, Place, and Person)  Thought Content: Logical   Suicidal Thoughts:  No  Homicidal Thoughts:  No  Memory:  WNL  Judgement:  Good  Insight:  Good  Psychomotor Activity:  Tremor and very mild bilat hand tremor of outstretched hands. Right (dominant hand) sl worse than left.   Concentration:  Concentration: Good and Attention Span: Good  Recall:  Good  Fund of Knowledge: Good  Language: Good  Assets:  Desire for Improvement  ADL's:  Intact  Cognition: WNL  Prognosis:  Good    DIAGNOSES:    ICD-10-CM   1. 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 Sexual dysfunction  R37   4. 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 Ronda Fairly, [facility] in [place].    RECOMMENDATIONS:  PDMP reviewed.  I provided 40 minutes of face-to-face time during this encounter including time spent before visit reviewing records from previous psychiatrist, Dr. Lorraine Lax (see on chart). We also discussed the anxiety, depression, ED. Due to depression and uncontrolled anxiety, recommend increase the Zoloft, even with the ED. He understands and would like to increase the dose. He brought back the remainder of the xanax, count is correct, and bottle given to Larina Earthly, Glass blower/designer, to dispose of properly. Will change back to Klonopin b/c it lasts longer and is more effective.  Increase Zoloft to 200 mg daily. D/c Xanax.  Restart Klonopin 1 mg, 1 p.o. 3 times daily as needed. Continue Abilify 5 mg every morning. Continue Lamictal 100 mg, 1 p.o. daily. Continue trazodone 150 mg, 1/2 po qhs prn sleep. Continue therapy with Dr. Ronda Fairly. Return in 4-6 weeks  Donnal Moat, PA-C

## 2020-02-20 ENCOUNTER — Ambulatory Visit
Admission: RE | Admit: 2020-02-20 | Discharge: 2020-02-20 | Disposition: A | Discharge status: Home / Self Care | Payer: Medicare Other | Source: Ambulatory Visit | Attending: Physician Assistant | Admitting: Physician Assistant

## 2020-02-20 ENCOUNTER — Other Ambulatory Visit: Payer: Self-pay | Admitting: Physician Assistant

## 2020-02-20 DIAGNOSIS — M25551 Pain in right hip: Secondary | ICD-10-CM

## 2020-03-19 ENCOUNTER — Ambulatory Visit (INDEPENDENT_AMBULATORY_CARE_PROVIDER_SITE_OTHER): Payer: Medicare Other | Admitting: Physician Assistant

## 2020-03-19 ENCOUNTER — Other Ambulatory Visit: Payer: Self-pay

## 2020-03-19 ENCOUNTER — Encounter: Payer: Self-pay | Admitting: Physician Assistant

## 2020-03-19 DIAGNOSIS — F330 Major depressive disorder, recurrent, mild: Secondary | ICD-10-CM | POA: Diagnosis not present

## 2020-03-19 DIAGNOSIS — F411 Generalized anxiety disorder: Secondary | ICD-10-CM

## 2020-03-19 DIAGNOSIS — R37 Sexual dysfunction, unspecified: Secondary | ICD-10-CM

## 2020-03-19 MED ORDER — ARIPIPRAZOLE 5 MG PO TABS: 5.0000 mg | ORAL_TABLET | Freq: Every day | ORAL | 1 refills | Status: DC

## 2020-03-19 MED ORDER — TRAZODONE HCL 150 MG PO TABS: 75.0000 mg | ORAL_TABLET | Freq: Every evening | ORAL | 1 refills | Status: DC | PRN

## 2020-03-19 MED ORDER — CLONAZEPAM 1 MG PO TABS: 1.0000 mg | ORAL_TABLET | Freq: Three times a day (TID) | ORAL | 2 refills | Status: DC | PRN

## 2020-03-19 NOTE — Progress Notes (Signed)
[facility]  Patient ID: Richard Wilson,  MRN: [id number]  PCP: Johna Roles, PA  Date of Evaluation: 03/19/2020 Time spent:20 minutes  Chief Complaint:  Chief Complaint    Insomnia; Anxiety; Depression; Follow-up      HISTORY/CURRENT STATUS: For routine med check.  Is doing some better 'in some areas' feels more hopeful and has more purpose by getting more involved in church.  Is enjoying things more overall, but this can be a difficult time of year for him.  Energy and motivation are fair to good.  Not isolating as much.  Does not cry easily.  Denies suicidal or homicidal thoughts.  Patient denies increased energy with decreased need for sleep, no increased talkativeness, no racing thoughts, no impulsivity or risky behaviors, no increased spending, no increased libido, no grandiosity, no increased irritability or anger, and no hallucinations.  Denies dizziness, syncope, seizures, numbness, tingling, tremor, tics, unsteady gait, slurred speech, confusion. Denies dizziness, syncope, seizures, numbness, tingling, tremor, tics, unsteady gait, slurred speech, confusion.   Individual Medical History/ Review of Systems: Changes? :No    Past medications for mental health diagnoses include: Rozerem, Trazodone caused vivid dreams, Xanax, Buspar, mirtazapine caused excessive somnolence and fatigue with flulike symptoms, Belsomra, propranolol for tremor, Zoloft, Abilify, Klonopin, Effexor, Ativan, Wellbutrin, Nardil, lithium, gabapentin, Librium  Pertinent info after reviewing Dr. Evelena Leyden notes, his previous psychiatrist in [facility]. Past suicide attempt in September 1996, by taking Tylenol.  He was admitted at that time to a psychiatric hospital in [facility].  Admitted to psych hospital multiple times in the past for "nervous breakdowns."  January 1985, February 1986, October 1991, September 1996, July 2000.  Allergies: Garamycin  [gentamicin] and Penicillins  Current Medications:  Current Outpatient Medications:  .  lamoTRIgine (LAMICTAL) 100 MG tablet, Take 100 mg by mouth daily., Disp: , Rfl:  .  sertraline (ZOLOFT) 100 MG tablet, Take 2 tablets (200 mg total) by mouth daily., Disp: 180 tablet, Rfl: 0 .  ARIPiprazole (ABILIFY) 5 MG tablet, Take 1 tablet (5 mg total) by mouth daily., Disp: 90 tablet, Rfl: 1 .  clonazePAM (KLONOPIN) 1 MG tablet, Take 1 tablet (1 mg total) by mouth 3 (three) times daily as needed for anxiety., Disp: 90 tablet, Rfl: 2 .  pravastatin (PRAVACHOL) 40 MG tablet, Take 40 mg by mouth daily. (Patient not taking: No sig reported), Disp: , Rfl:  .  Probiotic Product (PROBIOTIC-10 PO), Take 1 tablet by mouth daily. (Patient not taking: No sig reported), Disp: , Rfl:  .  traZODone (DESYREL) 150 MG tablet, Take 0.5 tablets (75 mg total) by mouth at bedtime as needed for sleep., Disp: 90 tablet, Rfl: 1 Medication Side Effects: none  Family Medical/ Social History: Changes?  No  MENTAL HEALTH EXAM:  There were no vitals taken for this visit.There is no height or weight on file to calculate BMI.  General Appearance: Casual, Neat and Well Groomed  Eye Contact:  Good  Speech:  Clear and Coherent and Normal Rate  Volume:  Normal  Mood:  Euthymic  Affect:  Appropriate  Thought Process:  Goal Directed and Descriptions of Associations: Intact  Orientation:  Full (Time, Place, and Person)  Thought Content: Logical   Suicidal Thoughts:  No  Homicidal Thoughts:  No  Memory:  WNL  Judgement:  Good  Insight:  Good  Psychomotor Activity:  Normal  Concentration:  Concentration: Good and Attention Span: Good  Recall:  Good  Fund of Knowledge: Good  Language: Good  Assets:  Desire for Improvement  ADL's:  Intact  Cognition: WNL  Prognosis:  Good    DIAGNOSES:    ICD-10-CM   1. 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 Sexual  dysfunction  R37     Receiving Psychotherapy: Yes     RECOMMENDATIONS:  PDMP reviewed.  I provided 20 minutes of face-to-face time during this encounter.  We discussed medications and doses.  Although were not changing anything, I think we should see each other sooner rather than later because this time of year can be hard for him. Cont Zoloft 200 mg daily. Cont Klonopin 1 mg, 1 p.o. 3 times daily as needed. Continue Abilify 5 mg every morning. Continue Lamictal 100 mg, 1 p.o. daily. Continue trazodone 150 mg, 1/2 po qhs prn sleep. Continue therapy. Return in 6 weeks  Donnal Moat, PA-C

## 2020-04-03 ENCOUNTER — Telehealth: Payer: Self-pay | Admitting: Hematology and Oncology

## 2020-04-03 NOTE — Telephone Encounter (Signed)
Received a new hem referral from [place] at [facility] for polycthemia. Mr. Richard Wilson has been cld and scheduled to see Dr. Alvy Bimler on 3/8 at 1pm. Pt aware to arrive 30 minutes early.

## 2020-04-04 ENCOUNTER — Other Ambulatory Visit: Payer: Self-pay | Admitting: Physician Assistant

## 2020-04-04 ENCOUNTER — Telehealth: Payer: Self-pay | Admitting: Physician Assistant

## 2020-04-04 MED ORDER — CLONAZEPAM 1 MG PO TABS: 1.0000 mg | ORAL_TABLET | Freq: Three times a day (TID) | ORAL | 0 refills | Status: DC | PRN

## 2020-04-04 NOTE — Telephone Encounter (Signed)
Rx for #30 sent in.

## 2020-04-04 NOTE — Telephone Encounter (Signed)
Pt would like a refill on clonazepam 1mg . Please send to new pharmacy [facility] [address] [place]. Pt would like all rx moved there.

## 2020-04-04 NOTE — Telephone Encounter (Signed)
Pt called to report [facility] system was down this week and they are behind over 700 Rx. Will take 2-3 days to fill his Rx.  Not trying to be a bother. Just had a lot of issues with this location. Send Rx to [address] location for Clonazepam.

## 2020-04-07 NOTE — Progress Notes (Signed)
[facility] CONSULT NOTE  Patient Care Team: Willows, [name], [place] as PCP - General (Internal Medicine)  CHIEF COMPLAINTS/PURPOSE OF CONSULTATION:  Erythrocytosis  HISTORY OF PRESENTING ILLNESS:  Richard Wilson 70 y.o. male is here because of elevated hemoglobin.  He was found to have abnormal CBC from recent blood work On 03/25/2020, his CBC show hemoglobin of 17.0 with hematocrit of 51.9 Of note, his PSA was also elevated at 10.  He is being monitored closely in the urology office for chronic elevated PSA for the last 7 years On review of electronic records, CBC from 10/12/2018 show hemoglobin of 16.2 with hematocrit of 46.9  He denies intermittent headaches, shortness of breath on exertion,and occasional chest pain.  He has occasional leg cramps He never suffer from diagnosis of blood clot.  There is no prior diagnosis of obstructive sleep apnea. The patient denies weight loss or skin itching but has occasional rare sweats at night. He lives alone and does not know if he has snoring issues.  He does not smoke He does not use testosterone replacement products  MEDICAL HISTORY:  Past Medical History:  Diagnosis Date  . Anxiety   . Depression   . Hyperlipidemia   . Tremor     SURGICAL HISTORY: Past Surgical History:  Procedure Laterality Date  . APPENDECTOMY    . TONSILLECTOMY      SOCIAL HISTORY: Social History   Socioeconomic History  . Marital status: Unknown    Spouse name: Not on file  . Number of children: Not on file  . Years of education: Not on file  . Highest education level: Not on file  Occupational History  . Occupation: Disability    Comment: 2001 for GAD  . Occupation: Librarian  Tobacco Use  . Smoking status: Never Smoker  . Smokeless tobacco: Never Used  Vaping Use  . Vaping Use: Never used  Substance and Sexual Activity  . Alcohol use: Yes    Alcohol/week: 7.0 standard drinks    Types: 7 Glasses of wine per week  . Drug use:  Never  . Sexual activity: Not on file  Other Topics Concern  . Not on file  Social History Narrative   Grew up in [facility]. Had a good childhood. Never abused.    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    Never married. No kids. Is gay.      No legal trouble.   [place]   Social Determinants of Health   Financial Resource Strain: Not on file  Food Insecurity: Not on file  Transportation Needs: Not on file  Physical Activity: Not on file  Stress: Not on file  Social Connections: Not on file  Intimate Partner Violence: Not on file    FAMILY HISTORY: Family History  Problem Relation Age of Onset  . Healthy Mother   . Prostate cancer Father   . Throat cancer Maternal Grandfather   . Heart attack Paternal Grandfather     ALLERGIES:  is allergic to garamycin [gentamicin] and penicillins.  MEDICATIONS:  Current Outpatient Medications  Medication Sig Dispense Refill  . ARIPiprazole (ABILIFY) 5 MG tablet Take 1 tablet (5 mg total) by mouth daily. 90 tablet 1  . clonazePAM (KLONOPIN) 1 MG tablet TAKE 1 TABLET(1 MG) BY MOUTH THREE TIMES DAILY AS NEEDED FOR ANXIETY 90 tablet 1  . clonazePAM (KLONOPIN) 1 MG tablet Take 1 tablet (1 mg total) by mouth  3 (three) times daily as needed for anxiety. 30 tablet 0  . lamoTRIgine (LAMICTAL) 100 MG tablet Take 100 mg by mouth daily.    . sertraline (ZOLOFT) 100 MG tablet Take 2 tablets (200 mg total) by mouth daily. 180 tablet 0  . traZODone (DESYREL) 150 MG tablet Take 0.5 tablets (75 mg total) by mouth at bedtime as needed for sleep. 90 tablet 1   No current facility-administered medications for this visit.    REVIEW OF SYSTEMS:   Constitutional: Denies fevers, chills or abnormal night sweats Eyes: Denies blurriness of vision, double vision or watery eyes Ears, nose, mouth, throat, and face: Denies mucositis or sore throat Respiratory: Denies cough, dyspnea or  wheezes Cardiovascular: Denies palpitation, chest discomfort or lower extremity swelling Gastrointestinal:  Denies nausea, heartburn or change in bowel habits Skin: Denies abnormal skin rashes Lymphatics: Denies new lymphadenopathy or easy bruising Neurological:Denies numbness, tingling or new weaknesses Behavioral/Psych: Mood is stable, no new changes  All other systems were reviewed with the patient and are negative.  PHYSICAL EXAMINATION: ECOG PERFORMANCE STATUS: 1 - Symptomatic but completely ambulatory  Vitals:   04/08/20 1200  BP: 122/81  Pulse: 81  Resp: 18  Temp: (!) 97.4 F (36.3 C)  SpO2: 97%   Filed Weights   04/08/20 1200  Weight: 196 lb (88.9 kg)    GENERAL:alert, no distress and comfortable.  He looks plethoric SKIN: skin color, texture, turgor are normal, no rashes or significant lesions EYES: normal, conjunctiva are pink and non-injected, sclera clear OROPHARYNX:no exudate, no erythema and lips, buccal mucosa, and tongue normal  NECK: supple, thyroid normal size, non-tender, without nodularity LYMPH:  no palpable lymphadenopathy in the cervical, axillary or inguinal LUNGS: clear to auscultation and percussion with normal breathing effort HEART: regular rate & rhythm and no murmurs and no lower extremity edema ABDOMEN:abdomen soft, non-tender and normal bowel sounds.  Abdomen is distended.  No splenomegaly Musculoskeletal:no cyanosis of digits and no clubbing  PSYCH: alert & oriented x 3 with fluent speech NEURO: no focal motor/sensory deficits  LABORATORY DATA:  I have reviewed the data as listed Recent Results (from the past 2160 hour(s))  CBC with Differential/Platelet     Status: Abnormal   Collection Time: 04/08/20 12:22 PM  Result Value Ref Range   WBC 5.2 4.0 - 10.5 K/uL   RBC 6.57 (H) 4.22 - 5.81 MIL/uL   Hemoglobin 17.5 (H) 13.0 - 17.0 g/dL   HCT 53.9 (H) 39.0 - 52.0 %   MCV 82.0 80.0 - 100.0 fL   MCH 26.6 26.0 - 34.0 pg   MCHC 32.5 30.0 -  36.0 g/dL   RDW 18.6 (H) 11.5 - 15.5 %   Platelets 142 (L) 150 - 400 K/uL   nRBC 0.0 0.0 - 0.2 %   Neutrophils Relative % 68 %   Neutro Abs 3.5 1.7 - 7.7 K/uL   Lymphocytes Relative 20 %   Lymphs Abs 1.1 0.7 - 4.0 K/uL   Monocytes Relative 9 %   Monocytes Absolute 0.5 0.1 - 1.0 K/uL   Eosinophils Relative 2 %   Eosinophils Absolute 0.1 0.0 - 0.5 K/uL   Basophils Relative 1 %   Basophils Absolute 0.0 0.0 - 0.1 K/uL   Immature Granulocytes 0 %   Abs Immature Granulocytes 0.01 0.00 - 0.07 K/uL    Comment: Performed at [facility], [address] [place], [place] [address]    RADIOGRAPHIC STUDIES: He had CT imaging of the abdomen  and pelvis in 2019 which showed no evidence of splenomegaly  ASSESSMENT & PLAN Polycythemia vera (HCC) His elevated hemoglobin could be either due to undiagnosed obstructive sleep apnea versus myeloproliferative disorder We discussed work-up The patient would like to proceed with extended evaluation with molecular studies to rule out myeloproliferative disorder I plan to see him back in 2 weeks for further follow-up  Central obesity This could be related to alcohol intake We discussed the importance of lifestyle modification and staying abstinent from alcohol intake  Thrombocytopenia ([place]) Could be due to alcohol intake Observe closely for now  Orders Placed This Encounter  Procedures  . CBC with Differential/Platelet    Standing Status:   Future    Number of Occurrences:   1    Standing Expiration Date:   04/08/2021  . Sedimentation rate    Standing Status:   Future    Number of Occurrences:   1    Standing Expiration Date:   04/08/2021  . Erythropoietin    Standing Status:   Future    Number of Occurrences:   1    Standing Expiration Date:   04/08/2021  . Lactate dehydrogenase    Standing Status:   Future    Number of Occurrences:   1    Standing Expiration Date:   04/08/2021  . JAK2 (including V617F and Exon 12), MPL,  and CALR-Next Generation Sequencing    Standing Status:   Future    Number of Occurrences:   1    Standing Expiration Date:   04/08/2021    The total time spent in the appointment was 55 minutes encounter with patients including review of chart and various tests results, discussions about plan of care and coordination of care plan   All questions were answered. The patient knows to call the clinic with any problems, questions or concerns. No barriers to learning was detected.  [name], MD 3/8/20221:29 PM

## 2020-04-08 ENCOUNTER — Encounter: Payer: Self-pay | Admitting: Hematology and Oncology

## 2020-04-08 ENCOUNTER — Other Ambulatory Visit: Payer: Self-pay

## 2020-04-08 ENCOUNTER — Telehealth: Payer: Self-pay | Admitting: Hematology and Oncology

## 2020-04-08 ENCOUNTER — Inpatient Hospital Stay: Payer: Medicare Other

## 2020-04-08 ENCOUNTER — Inpatient Hospital Stay: Payer: Medicare Other | Attending: Hematology and Oncology | Admitting: Hematology and Oncology

## 2020-04-08 DIAGNOSIS — F418 Other specified anxiety disorders: Secondary | ICD-10-CM | POA: Diagnosis not present

## 2020-04-08 DIAGNOSIS — D45 Polycythemia vera: Secondary | ICD-10-CM

## 2020-04-08 DIAGNOSIS — D696 Thrombocytopenia, unspecified: Secondary | ICD-10-CM | POA: Insufficient documentation

## 2020-04-08 DIAGNOSIS — E785 Hyperlipidemia, unspecified: Secondary | ICD-10-CM | POA: Insufficient documentation

## 2020-04-08 DIAGNOSIS — D751 Secondary polycythemia: Secondary | ICD-10-CM | POA: Insufficient documentation

## 2020-04-08 DIAGNOSIS — Z79899 Other long term (current) drug therapy: Secondary | ICD-10-CM | POA: Insufficient documentation

## 2020-04-08 DIAGNOSIS — R972 Elevated prostate specific antigen [PSA]: Secondary | ICD-10-CM | POA: Diagnosis not present

## 2020-04-08 DIAGNOSIS — E65 Localized adiposity: Secondary | ICD-10-CM | POA: Diagnosis not present

## 2020-04-08 DIAGNOSIS — Z9889 Other specified postprocedural states: Secondary | ICD-10-CM | POA: Diagnosis not present

## 2020-04-08 DIAGNOSIS — E669 Obesity, unspecified: Secondary | ICD-10-CM | POA: Diagnosis not present

## 2020-04-08 HISTORY — DX: Thrombocytopenia, unspecified: D69.6

## 2020-04-08 HISTORY — DX: Secondary polycythemia: D75.1

## 2020-04-08 HISTORY — DX: Localized adiposity: E65

## 2020-04-08 LAB — CBC WITH DIFFERENTIAL/PLATELET
Abs Immature Granulocytes: 0.01 10*3/uL (ref 0.00–0.07)
Basophils Absolute: 0 10*3/uL (ref 0.0–0.1)
Basophils Relative: 1 %
Eosinophils Absolute: 0.1 10*3/uL (ref 0.0–0.5)
Eosinophils Relative: 2 %
HCT: 53.9 % — ABNORMAL HIGH (ref 39.0–52.0)
Hemoglobin: 17.5 g/dL — ABNORMAL HIGH (ref 13.0–17.0)
Immature Granulocytes: 0 %
Lymphocytes Relative: 20 %
Lymphs Abs: 1.1 10*3/uL (ref 0.7–4.0)
MCH: 26.6 pg (ref 26.0–34.0)
MCHC: 32.5 g/dL (ref 30.0–36.0)
MCV: 82 fL (ref 80.0–100.0)
Monocytes Absolute: 0.5 10*3/uL (ref 0.1–1.0)
Monocytes Relative: 9 %
Neutro Abs: 3.5 10*3/uL (ref 1.7–7.7)
Neutrophils Relative %: 68 %
Platelets: 142 10*3/uL — ABNORMAL LOW (ref 150–400)
RBC: 6.57 MIL/uL — ABNORMAL HIGH (ref 4.22–5.81)
RDW: 18.6 % — ABNORMAL HIGH (ref 11.5–15.5)
WBC: 5.2 10*3/uL (ref 4.0–10.5)
nRBC: 0 % (ref 0.0–0.2)

## 2020-04-08 LAB — SEDIMENTATION RATE: Sed Rate: 0 mm/hr (ref 0–16)

## 2020-04-08 LAB — LACTATE DEHYDROGENASE: LDH: 209 U/L — ABNORMAL HIGH (ref 98–192)

## 2020-04-08 NOTE — Assessment & Plan Note (Signed)
Could be due to alcohol intake Observe closely for now

## 2020-04-08 NOTE — Assessment & Plan Note (Signed)
This could be related to alcohol intake We discussed the importance of lifestyle modification and staying abstinent from alcohol intake

## 2020-04-08 NOTE — Telephone Encounter (Signed)
Scheduled appts per 3/8 sch msg. Gave pt a print out of AVS.

## 2020-04-08 NOTE — Assessment & Plan Note (Signed)
His elevated hemoglobin could be either due to undiagnosed obstructive sleep apnea versus myeloproliferative disorder We discussed work-up The patient would like to proceed with extended evaluation with molecular studies to rule out myeloproliferative disorder I plan to see him back in 2 weeks for further follow-up

## 2020-04-09 LAB — ERYTHROPOIETIN: Erythropoietin: 18.2 m[IU]/mL (ref 2.6–18.5)

## 2020-04-10 ENCOUNTER — Telehealth: Payer: Self-pay | Admitting: Physician Assistant

## 2020-04-10 NOTE — Telephone Encounter (Signed)
Pt would like you to send Viagra Rx to the [facility] on file [address].He has a good Rx card he will use

## 2020-04-10 NOTE — Telephone Encounter (Signed)
Does he mean encopresis? Constipation? We haven't changed the meds lately so I doubt they're causing issues unless he's constipated.  Recommend he see his PCP.

## 2020-04-10 NOTE — Telephone Encounter (Signed)
Pt confirmed he is having erectile dysfunction (ED) not rectal dysfunction.He said Helene Kelp told him before it could possibly be a SE of the Zoloft he is taking.He stated Viagra is not a option due to it being expensive and not covered through his Medicare.

## 2020-04-10 NOTE — Telephone Encounter (Signed)
Oops.  Ok, yes the Zoloft and the trazodone could be causing the problem.  I still recommend that he sees his PCP because there may be other issues as well.  But I will prescribe Viagra or Cialis if he would like for me to.  I looked up those meds on [facility] and 30 pills for either 1 of those drugs is in the $12-$20 range.  He does not have to use his insurance to pay for that.

## 2020-04-10 NOTE — Telephone Encounter (Signed)
Pt called and said that he is having problems with rectal disfunction. He wants to know if a change in mediciation cause this? Please call him at [phone]

## 2020-04-11 ENCOUNTER — Other Ambulatory Visit: Payer: Self-pay | Admitting: Physician Assistant

## 2020-04-11 MED ORDER — SILDENAFIL CITRATE 25 MG PO TABS: 25.0000 mg | ORAL_TABLET | Freq: Every day | ORAL | 0 refills | Status: DC | PRN

## 2020-04-11 NOTE — Telephone Encounter (Signed)
Prescription was sent

## 2020-04-14 LAB — JAK2 (INCLUDING V617F AND EXON 12), MPL,& CALR-NEXT GEN SEQ

## 2020-04-18 ENCOUNTER — Telehealth: Payer: Self-pay | Admitting: Hematology and Oncology

## 2020-04-18 NOTE — Telephone Encounter (Signed)
Scheduled per 03/18 scheduled message, rescheduled 03/21 appointment to 03/22 per patient's request.

## 2020-04-21 ENCOUNTER — Inpatient Hospital Stay: Payer: Medicare Other | Admitting: Hematology and Oncology

## 2020-04-22 ENCOUNTER — Inpatient Hospital Stay (HOSPITAL_BASED_OUTPATIENT_CLINIC_OR_DEPARTMENT_OTHER): Payer: Medicare Other | Admitting: Hematology and Oncology

## 2020-04-22 ENCOUNTER — Encounter: Payer: Self-pay | Admitting: Hematology and Oncology

## 2020-04-22 ENCOUNTER — Other Ambulatory Visit: Payer: Self-pay

## 2020-04-22 ENCOUNTER — Telehealth: Payer: Self-pay

## 2020-04-22 DIAGNOSIS — D45 Polycythemia vera: Secondary | ICD-10-CM

## 2020-04-22 NOTE — Progress Notes (Signed)
[place] OFFICE PROGRESS NOTE  Patient Care Team: Johna Roles, [place] as PCP - General (Internal Medicine)  ASSESSMENT & PLAN:  Polycythemia vera ([place]) I have reviewed multiple blood test result with the patient Peripheral blood for JAK2 mutation and common mutation studies came back negative but reflex study on next generation sequencing MPD panel came back positive It is not clear to me whether he has myeloproliferative disorder or something else I recommended bone marrow aspirate and biopsy We discussed the risk, benefits, side effects of bone marrow aspirate and biopsy, sedation versus unsedated The patient has elected for an sedated bone marrow biopsy but would like to wait until after Easter I recommend aspirin therapy He is not symptomatic Tentatively, I plan to schedule bone marrow biopsy on April 20 and then see him back in 2 weeks to review test results He is in agreement with the plan of care   No orders of the defined types were placed in this encounter.   All questions were answered. The patient knows to call the clinic with any problems, questions or concerns. The total time spent in the appointment was 20 minutes encounter with patients including review of chart and various tests results, discussions about plan of care and coordination of care plan   [name], MD 04/22/2020 12:10 PM  INTERVAL HISTORY: Please see below for problem oriented charting.  SUMMARY OF ONCOLOGIC HISTORY:  He was found to have abnormal CBC from recent blood work On 03/25/2020, his CBC show hemoglobin of 17.0 with hematocrit of 51.9 Of note, his PSA was also elevated at 10.  He is being monitored closely in the urology office for chronic elevated PSA for the last 7 years On review of electronic records, CBC from 10/12/2018 show hemoglobin of 16.2 with hematocrit of 46.9  He denies intermittent headaches, shortness of breath on exertion,and occasional chest pain.  He has  occasional leg cramps He never suffer from diagnosis of blood clot.  There is no prior diagnosis of obstructive sleep apnea. The patient denies weight loss or skin itching but has occasional rare sweats at night. He lives alone and does not know if he has snoring issues.  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*  REVIEW OF SYSTEMS:   Constitutional: Denies fevers, chills or abnormal weight loss Eyes: Denies blurriness of vision Ears, nose, mouth, throat, and face: Denies mucositis or sore throat Respiratory: Denies cough, dyspnea or wheezes Cardiovascular: Denies palpitation, chest discomfort or lower extremity swelling Gastrointestinal:  Denies nausea, heartburn or change in bowel habits Skin: Denies abnormal skin rashes Lymphatics: Denies new lymphadenopathy or easy bruising Neurological:Denies numbness, tingling or new weaknesses Behavioral/Psych: Mood is stable, no new changes  All other systems were reviewed with the patient and are negative.  I have reviewed the past medical history, past surgical history, social history and family history with the patient and they are unchanged from previous note.  ALLERGIES:  is allergic to garamycin [gentamicin] and penicillins.  MEDICATIONS:  Current Outpatient Medications  Medication Sig Dispense Refill  . ARIPiprazole (ABILIFY) 5 MG tablet Take 1 tablet (5 mg total) by mouth daily. 90 tablet 1  . clonazePAM (KLONOPIN) 1 MG tablet TAKE 1 TABLET(1 MG) BY MOUTH THREE TIMES DAILY AS NEEDED FOR ANXIETY 90 tablet 1  . clonazePAM (KLONOPIN) 1 MG tablet  Take 1 tablet (1 mg total) by mouth 3 (three) times daily as needed for anxiety. 30 tablet 0  . lamoTRIgine (LAMICTAL) 100 MG tablet Take 100 mg by mouth daily.    . sertraline (ZOLOFT) 100 MG tablet Take 2 tablets (200 mg  total) by mouth daily. 180 tablet 0   No current facility-administered medications for this visit.    PHYSICAL EXAMINATION: ECOG PERFORMANCE STATUS: 0 - Asymptomatic  Vitals:   04/22/20 1019  BP: 137/85  Pulse: 71  Resp: 18  Temp: 97.6 F (36.4 C)  SpO2: 98%   Filed Weights   04/22/20 1019  Weight: 193 lb 9.6 oz (87.8 kg)    GENERAL:alert, no distress and comfortable NEURO: alert & oriented x 3 with fluent speech, no focal motor/sensory deficits  LABORATORY DATA:  I have reviewed the data as listed No results found for: NA, K, CL, CO2, GLUCOSE, BUN, CREATININE, CALCIUM, PROT, ALBUMIN, AST, ALT, ALKPHOS, BILITOT, GFRNONAA, GFRAA  No results found for: SPEP, UPEP  Lab Results  Component Value Date   WBC 5.2 04/08/2020   NEUTROABS 3.5 04/08/2020   HGB 17.5 (H) 04/08/2020   HCT 53.9 (H) 04/08/2020   MCV 82.0 04/08/2020   PLT 142 (L) 04/08/2020      Chemistry   No results found for: NA, K, CL, CO2, BUN, CREATININE, GLU No results found for: CALCIUM, ALKPHOS, AST, ALT, BILITOT

## 2020-04-22 NOTE — Assessment & Plan Note (Signed)
I have reviewed multiple blood test result with the patient Peripheral blood for JAK2 mutation and common mutation studies came back negative but reflex study on next generation sequencing MPD panel came back positive It is not clear to me whether he has myeloproliferative disorder or something else I recommended bone marrow aspirate and biopsy We discussed the risk, benefits, side effects of bone marrow aspirate and biopsy, sedation versus unsedated The patient has elected for an sedated bone marrow biopsy but would like to wait until after Easter I recommend aspirin therapy He is not symptomatic Tentatively, I plan to schedule bone marrow biopsy on April 20 and then see him back in 2 weeks to review test results He is in agreement with the plan of care

## 2020-04-22 NOTE — Telephone Encounter (Signed)
Called and reviewed upcoming appts. He verbalized understanding. 

## 2020-04-23 ENCOUNTER — Telehealth: Payer: Self-pay

## 2020-04-23 NOTE — Telephone Encounter (Signed)
Called back and rescheduled bone marrow to 3/25, instructed to arrive at [place]. Reviewed al upcoming appts. He is aware of appt time/ date and verbalized understanding.

## 2020-04-23 NOTE — Telephone Encounter (Signed)
He called and left a message. He has changed his mind and now would like the bone marrow as soon as possible.

## 2020-04-23 NOTE — Telephone Encounter (Signed)
Can you call and inquire? I can either do Friday or next week Monday Let me know

## 2020-04-23 NOTE — Telephone Encounter (Signed)
Thanks

## 2020-04-25 ENCOUNTER — Other Ambulatory Visit: Payer: Self-pay | Admitting: Hematology and Oncology

## 2020-04-25 ENCOUNTER — Inpatient Hospital Stay (HOSPITAL_BASED_OUTPATIENT_CLINIC_OR_DEPARTMENT_OTHER): Payer: Medicare Other | Admitting: Hematology and Oncology

## 2020-04-25 ENCOUNTER — Encounter: Payer: Self-pay | Admitting: Hematology and Oncology

## 2020-04-25 ENCOUNTER — Other Ambulatory Visit: Payer: Self-pay

## 2020-04-25 ENCOUNTER — Inpatient Hospital Stay: Payer: Medicare Other

## 2020-04-25 VITALS — BP 115/90 | HR 63 | Temp 97.6°F | Resp 18 | Wt 191.5 lb

## 2020-04-25 DIAGNOSIS — D45 Polycythemia vera: Secondary | ICD-10-CM

## 2020-04-25 LAB — CBC WITH DIFFERENTIAL/PLATELET
Abs Immature Granulocytes: 0.02 10*3/uL (ref 0.00–0.07)
Basophils Absolute: 0 10*3/uL (ref 0.0–0.1)
Basophils Relative: 1 %
Eosinophils Absolute: 0.2 10*3/uL (ref 0.0–0.5)
Eosinophils Relative: 3 %
HCT: 54.4 % — ABNORMAL HIGH (ref 39.0–52.0)
Hemoglobin: 17.2 g/dL — ABNORMAL HIGH (ref 13.0–17.0)
Immature Granulocytes: 0 %
Lymphocytes Relative: 21 %
Lymphs Abs: 1.1 10*3/uL (ref 0.7–4.0)
MCH: 26.8 pg (ref 26.0–34.0)
MCHC: 31.6 g/dL (ref 30.0–36.0)
MCV: 84.7 fL (ref 80.0–100.0)
Monocytes Absolute: 0.5 10*3/uL (ref 0.1–1.0)
Monocytes Relative: 10 %
Neutro Abs: 3.5 10*3/uL (ref 1.7–7.7)
Neutrophils Relative %: 65 %
Platelets: 144 10*3/uL — ABNORMAL LOW (ref 150–400)
RBC: 6.42 MIL/uL — ABNORMAL HIGH (ref 4.22–5.81)
RDW: 18.5 % — ABNORMAL HIGH (ref 11.5–15.5)
WBC: 5.4 10*3/uL (ref 4.0–10.5)
nRBC: 0 % (ref 0.0–0.2)

## 2020-04-25 MED ORDER — LIDOCAINE HCL 2 % IJ SOLN
INTRAMUSCULAR | Status: AC
  Filled 2020-04-25: qty 20

## 2020-04-25 NOTE — Progress Notes (Signed)
Pt remained in room for observation post bone marrow biopsy and aspiration with [name], MD. Pt vitals WNL and dressing on site was clean dry and intact. Pt educated and all questions were answered. Ambulatory to lobby and stable at discharge.

## 2020-04-25 NOTE — Patient Instructions (Signed)
Bone Marrow Aspiration and Bone Marrow Biopsy, Adult, Care After This sheet gives you information about how to care for yourself after your procedure. Your health care provider may also give you more specific instructions. If you have problems or questions, contact your health care provider. What can I expect after the procedure? After the procedure, it is common to have:  Mild pain and tenderness.  Swelling.  Bruising. Follow these instructions at home: Puncture site care  Follow instructions from your health care provider about how to take care of the puncture site. Make sure you: ? Wash your hands with soap and water before and after you change your bandage (dressing). If soap and water are not available, use hand sanitizer. ? Change your dressing as told by your health care provider.  Check your puncture site every day for signs of infection. Check for: ? More redness, swelling, or pain. ? Fluid or blood. ? Warmth. ? Pus or a bad smell.   Activity  Return to your normal activities as told by your health care provider. Ask your health care provider what activities are safe for you.  Do not lift anything that is heavier than 10 lb (4.5 kg), or the limit that you are told, until your health care provider says that it is safe.  Do not drive for 24 hours if you were given a sedative during your procedure. General instructions  Take over-the-counter and prescription medicines only as told by your health care provider.  Do not take baths, swim, or use a hot tub until your health care provider approves. Ask your health care provider if you may take showers. You may only be allowed to take sponge baths.  If directed, put ice on the affected area. To do this: ? Put ice in a plastic bag. ? Place a towel between your skin and the bag. ? Leave the ice on for 20 minutes, 2-3 times a day.  Keep all follow-up visits as told by your health care provider. This is important.   Contact a  health care provider if:  Your pain is not controlled with medicine.  You have a fever.  You have more redness, swelling, or pain around the puncture site.  You have fluid or blood coming from the puncture site.  Your puncture site feels warm to the touch.  You have pus or a bad smell coming from the puncture site. Summary  After the procedure, it is common to have mild pain, tenderness, swelling, and bruising.  Follow instructions from your health care provider about how to take care of the puncture site and what activities are safe for you.  Take over-the-counter and prescription medicines only as told by your health care provider.  Contact a health care provider if you have any signs of infection, such as fluid or blood coming from the puncture site. This information is not intended to replace advice given to you by your health care provider. Make sure you discuss any questions you have with your health care provider. Document Revised: 06/06/2018 Document Reviewed: 06/06/2018 Elsevier Patient Education  2021 [employer] Inc[employer]  

## 2020-04-25 NOTE — Progress Notes (Signed)
Bone Marrow Biopsy and Aspiration Procedure Note   Informed consent was obtained and potential risks including bleeding, infection and pain were reviewed with the patient. The patient's name, date of birth, identification, consent and allergies were verified prior to the start of procedure and time out was performed.  The left posterior iliac crest was chosen as the site of biopsy.  The skin was prepped with Betadine solution.   8 cc of 1% lidocaine was used to provide local anaesthesia.   10 cc of bone marrow aspirate was obtained followed by 1 inch biopsy.   The procedure was tolerated well and there were no complications.  The patient was stable at the end of the procedure.  Specimens sent for flow cytometry, cytogenetics and additional studies.

## 2020-04-28 ENCOUNTER — Inpatient Hospital Stay (HOSPITAL_BASED_OUTPATIENT_CLINIC_OR_DEPARTMENT_OTHER): Payer: Medicare Other | Admitting: Hematology and Oncology

## 2020-04-28 ENCOUNTER — Telehealth: Payer: Self-pay

## 2020-04-28 ENCOUNTER — Other Ambulatory Visit: Payer: Self-pay

## 2020-04-28 ENCOUNTER — Other Ambulatory Visit: Payer: Self-pay | Admitting: Physician Assistant

## 2020-04-28 VITALS — BP 115/74 | HR 69 | Temp 97.5°F | Resp 18 | Ht 69.0 in | Wt 194.4 lb

## 2020-04-28 DIAGNOSIS — D45 Polycythemia vera: Secondary | ICD-10-CM

## 2020-04-28 NOTE — Progress Notes (Signed)
The patient is concerned about bone marrow biopsy site since last week He took 1 dose of Tylenol on Saturday He described the site is sore but not painful He denies bleeding On brief exam, the bone marrow site is completely healed The patient is reassured

## 2020-04-28 NOTE — Telephone Encounter (Signed)
He called and left a message to call him regarding bone marrow biopsy site.  Called back. He is complaining of soreness at biopsy site. He is unable to see the biopsy site and is concerned that he cannot see it.  Instructed to take tylenol around the clock for a few days for the soreness or Dr. Alvy Bimler could prescribe something a little stronger for a few days. He prefers appt with Dr. Alvy Bimler and does not thing he needs anything any stronger. Appt scheduled for today and he is aware of appt time.

## 2020-04-30 ENCOUNTER — Telehealth: Payer: Self-pay

## 2020-04-30 NOTE — Telephone Encounter (Signed)
He called and left a message to call him.  Called back. He has been having right hip pain for 4-5 months and getting PT. He forgot to mention the hip pain.  Above message given to Dr. Alvy Bimler. Instructed to take tylenol 1-2 tabs TID for pain. He verbalized understanding.

## 2020-05-06 ENCOUNTER — Encounter (HOSPITAL_COMMUNITY): Payer: Self-pay | Admitting: Hematology and Oncology

## 2020-05-06 ENCOUNTER — Encounter: Payer: Self-pay | Admitting: Hematology and Oncology

## 2020-05-06 ENCOUNTER — Inpatient Hospital Stay: Payer: Medicare Other | Attending: Hematology and Oncology | Admitting: Hematology and Oncology

## 2020-05-06 DIAGNOSIS — D696 Thrombocytopenia, unspecified: Secondary | ICD-10-CM

## 2020-05-06 DIAGNOSIS — D751 Secondary polycythemia: Secondary | ICD-10-CM

## 2020-05-06 DIAGNOSIS — E65 Localized adiposity: Secondary | ICD-10-CM | POA: Diagnosis not present

## 2020-05-06 DIAGNOSIS — D45 Polycythemia vera: Secondary | ICD-10-CM | POA: Diagnosis not present

## 2020-05-06 NOTE — Assessment & Plan Note (Signed)
His bone marrow biopsy is not consistent with polycythemia vera Secondary erythrocytosis from other causes such as undiagnosed sleep apnea could be a potential cause For now, I do not recommend phlebotomy or aspirin therapy I will see him back next week for further follow-up

## 2020-05-06 NOTE — Assessment & Plan Note (Signed)
His thrombocytopenia is not consistent with bone marrow disorder It is possible that he might have undiagnosed liver cirrhosis or splenomegaly I recommend CT imaging for further evaluation

## 2020-05-06 NOTE — Progress Notes (Signed)
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 with polycythemia vera Secondary erythrocytosis from other causes such as undiagnosed sleep apnea could be a potential cause For now, I do not recommend phlebotomy or aspirin therapy I will see him back next week for further follow-up  Thrombocytopenia ([place]) His thrombocytopenia is not consistent with bone marrow disorder It is possible that he might have undiagnosed liver cirrhosis or splenomegaly I recommend CT imaging for further evaluation  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 CT ABDOMEN PELVIS W CONTRAST    Standing Status:   Future    Standing Expiration Date:   05/06/2021    Order Specific Question:   If indicated for the ordered procedure, I authorize the administration of contrast media per Radiology protocol    Answer:   Yes    Order Specific Question:   Preferred imaging location?    Answer:   [facility]    Order Specific Question:   Radiology Contrast Protocol - do NOT remove file path    Answer:   [email or web address]    INTERVAL HISTORY: Please see below for problem oriented charting. The purpose of today's visit is to review bone marrow aspirate and biopsy report His bone marrow biopsy did not show features of myeloproliferative disorder It is not clear why he has persistent thrombocytopenia He has no new symptoms since last time I saw him  SUMMARY OF  ONCOLOGIC HISTORY: Oncology History  Secondary erythrocytosis  04/25/2020 Bone Marrow Biopsy   BONE MARROW, ASPIRATE, CLOT, CORE:  -  Mildly hypercellular bone marrow with trilineage hematopoiesis with adequate maturation  -  See comment   PERIPHERAL BLOOD:  -  Erythrocytosis  -  Minimal thrombocytopenia  -  See CBC data   COMMENT:   The bone marrow shows trilineage hematopoiesis with a full spectrum of maturation, without significant dysplasia or increase in blasts.  There is no definitive morphologic evidence of involvement by a myeloid  neoplasm.  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  Correlation with clinical impressions, other laboratory data (eg. CBC trends), and pending cytogenetic results is recommended.  Cytogenetics are normal     REVIEW OF SYSTEMS:   Constitutional: Denies fevers, chills or abnormal weight loss Eyes: Denies blurriness of vision Ears, nose, mouth, throat, and face: Denies mucositis or sore throat Respiratory: Denies cough, dyspnea or wheezes Cardiovascular: Denies palpitation, chest discomfort Gastrointestinal:  Denies nausea, heartburn or change in bowel habits Skin: Denies abnormal skin rashes Lymphatics: Denies new lymphadenopathy or easy bruising Neurological:Denies numbness, tingling or new weaknesses Behavioral/Psych: Mood is stable, no new changes  Extremities: No lower extremity edema All other systems were reviewed with the patient and are negative.  I have reviewed the past medical history, past surgical history, social history and family history with the patient  and they are unchanged from previous note.  ALLERGIES:  is allergic to garamycin [gentamicin] and penicillins.  MEDICATIONS:  Current  Outpatient Medications  Medication Sig Dispense Refill  . ARIPiprazole (ABILIFY) 5 MG tablet Take 1 tablet (5 mg total) by mouth daily. 90 tablet 1  . clonazePAM (KLONOPIN) 1 MG tablet TAKE 1 TABLET(1 MG) BY MOUTH THREE TIMES DAILY AS NEEDED FOR ANXIETY 90 tablet 1  . clonazePAM (KLONOPIN) 1 MG tablet Take 1 tablet (1 mg total) by mouth 3 (three) times daily as needed for anxiety. 30 tablet 0  . lamoTRIgine (LAMICTAL) 100 MG tablet Take 100 mg by mouth daily.    . sertraline (ZOLOFT) 100 MG tablet TAKE 2 TABLETS(200 MG) BY MOUTH DAILY 180 tablet 0   No current facility-administered medications for this visit.    PHYSICAL EXAMINATION: ECOG PERFORMANCE STATUS: 1 - Symptomatic but completely ambulatory  LABORATORY DATA:  I have reviewed the data as listed No flowsheet data found.  Lab Results  Component Value Date   WBC 5.4 04/25/2020   HGB 17.2 (H) 04/25/2020   HCT 54.4 (H) 04/25/2020   MCV 84.7 04/25/2020   PLT 144 (L) 04/25/2020   NEUTROABS 3.5 04/25/2020    I discussed the assessment and treatment plan with the patient. The patient was provided an opportunity to ask questions and all were answered. The patient agreed with the plan and demonstrated an understanding of the instructions. The patient was advised to call back or seek an in-person evaluation if the symptoms worsen or if the condition fails to improve as anticipated.    I spent 20 minutes for the appointment reviewing test results, discuss management and coordination of care.  [name], MD 05/06/2020 12:20 PM

## 2020-05-06 NOTE — Assessment & Plan Note (Signed)
He is noted to have central obesity It is possible that he may have undiagnosed obstructive sleep apnea as potential cause of secondary erythrocytosis Recommend CT imaging to complete his work-up and I will see him next week for further follow-up

## 2020-05-08 LAB — SURGICAL PATHOLOGY

## 2020-05-12 ENCOUNTER — Ambulatory Visit (INDEPENDENT_AMBULATORY_CARE_PROVIDER_SITE_OTHER): Payer: Medicare Other | Admitting: Physician Assistant

## 2020-05-12 ENCOUNTER — Other Ambulatory Visit: Payer: Self-pay

## 2020-05-12 ENCOUNTER — Encounter: Payer: Self-pay | Admitting: Physician Assistant

## 2020-05-12 ENCOUNTER — Telehealth: Payer: Self-pay

## 2020-05-12 DIAGNOSIS — F330 Major depressive disorder, recurrent, mild: Secondary | ICD-10-CM

## 2020-05-12 DIAGNOSIS — R37 Sexual dysfunction, unspecified: Secondary | ICD-10-CM | POA: Diagnosis not present

## 2020-05-12 DIAGNOSIS — F411 Generalized anxiety disorder: Secondary | ICD-10-CM

## 2020-05-12 MED ORDER — BUSPIRONE HCL 15 MG PO TABS: ORAL_TABLET | ORAL | 1 refills | Status: DC

## 2020-05-12 MED ORDER — CLONAZEPAM 1 MG PO TABS: ORAL_TABLET | ORAL | 1 refills | Status: DC

## 2020-05-12 NOTE — Progress Notes (Signed)
[facility] Med Check  Patient ID: Richard Wilson,  MRN: [id number]  PCP: Johna Roles, PA  Date of Evaluation: 05/12/2020 Time spent:40 minutes  Chief Complaint:  Chief Complaint    Anxiety; Depression; Follow-up      HISTORY/CURRENT STATUS: HPI For routine med check.  "I'm really embarrassed. I was seeing Dr. Ronda Fairly, he asked me if I wanted a hug after one of our sessions and I said yes. He gave me a really tight hug, and I pressed my groin into him. The next week at our session, he told me he wasn't able to see me anymore. I was devastated. I felt dirty. I thought things were going well. He wanted to sit down and talk about the reasons for it. But I told him if we're done, we're done, and I left."  Anxiety is high. More generalized than PA. Klonopin and Zoloft have helped, but not enough. Ever since the episode w/ the counselor, has felt more uneasy about a lot of things. Looking for a new therapist isn't easy d/t medicare.   Depression isn't bad right now. Able to enjoy some things, energy and motivation are fair to good, not crying easily. Sleeps good most of the time w/ meds. No SI/HI.  Patient denies increased energy with decreased need for sleep, no increased talkativeness, no racing thoughts, no impulsivity or risky behaviors, no increased spending, no increased libido, no grandiosity, no increased irritability or anger, and no hallucinations.  Denies dizziness, syncope, seizures, numbness, tingling, tremor, tics, unsteady gait, slurred speech, confusion. Denies muscle or joint pain, stiffness, or dystonia.  Individual Medical History/ Review of Systems: Changes? :Yes  polycythemia vera. Had BMA&B, see on chart. He isn't overly concerned about it.   Past medications for mental health diagnoses include: Rozerem, Trazodone caused vivid dreams, Xanax, Buspar, mirtazapine caused excessive somnolence and fatigue with flulike symptoms, Belsomra, propranolol for  tremor, Zoloft, Abilify, Klonopin, Effexor, Ativan, Wellbutrin, Nardil, lithium, gabapentin, Librium  Pertinent info after reviewing Dr. Evelena Leyden notes, his previous psychiatrist in [facility]. Past suicide attempt in September 1996, by taking Tylenol.  He was admitted at that time to a psychiatric hospital in [facility].  Admitted to psych hospital multiple times in the past for "nervous breakdowns."  January 1985, February 1986, October 1991, September 1996, July 2000.  Allergies: Garamycin [gentamicin] and Penicillins  Current Medications:  Current Outpatient Medications:  .  ARIPiprazole (ABILIFY) 5 MG tablet, Take 1 tablet (5 mg total) by mouth daily., Disp: 90 tablet, Rfl: 1 .  busPIRone (BUSPAR) 15 MG tablet, 1/3 po bid for 1 week, then 2/3 po bid for 1 week, then 1 po bid., Disp: 60 tablet, Rfl: 1 .  lamoTRIgine (LAMICTAL) 100 MG tablet, Take 100 mg by mouth daily., Disp: , Rfl:  .  sertraline (ZOLOFT) 100 MG tablet, TAKE 2 TABLETS(200 MG) BY MOUTH DAILY, Disp: 180 tablet, Rfl: 0 .  clonazePAM (KLONOPIN) 1 MG tablet, TAKE 1 TABLET(1 MG) BY MOUTH THREE TIMES DAILY AS NEEDED FOR ANXIETY, Disp: 90 tablet, Rfl: 1 Medication Side Effects: no c/o sexual dysfunction now.  Family Medical/ Social History: Changes? See above  MENTAL HEALTH EXAM:  There were no vitals taken for this visit.There is no height or weight on file to calculate BMI.  General Appearance: Casual and Well Groomed  Eye Contact:  Good  Speech:  Clear and Coherent and Normal Rate  Volume:  Normal  Mood:  Anxious  Affect:  Anxious  Thought  Process:  Goal Directed and Descriptions of Associations: Intact  Orientation:  Full (Time, Place, and Person)  Thought Content: Logical   Suicidal Thoughts:  No  Homicidal Thoughts:  No  Memory:  WNL  Judgement:  Good  Insight:  Good  Psychomotor Activity:  Normal  Concentration:  Concentration: Good  Recall:  Good  Fund of Knowledge:  Good  Language: Good  Assets:  Desire for Improvement  ADL's:  Intact  Cognition: WNL  Prognosis:  Good    DIAGNOSES:    ICD-10-CM   1. 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 Sexual dysfunction  R37     Receiving Psychotherapy: No    RECOMMENDATIONS:  PDMP was reviewed.  I provided 40 mins of face to face time during this encounter including time spent before and after the visit in records review and charting.  Recommend starting BuSpar to help prevent and treat the generalized anxiety.  I prefer not to increase the Klonopin as he is already on a fairly high dose.  We may need to but hopefully the BuSpar will do the trick.  Another thought for later is increasing the Zoloft.  We discussed the benefits, risks, and side effects of BuSpar and he would like to try it. Start BuSpar 15 mg 1/3 tablet twice daily for 1 week, then increase to 2/3 tablet twice daily for 1 week, then increase to 1 tablet twice daily for anxiety. Continue Abilify 5 mg, 1 p.o. every morning. Continue Klonopin 1 mg, 1 p.o. 3 times daily as needed. Continue Lamictal 100 mg, 1 p.o. daily. Continue Zoloft 100 mg, 2 p.o. daily. Recommendations on counselors was given. Return in 2 months.  Donnal Moat, PA-C

## 2020-05-12 NOTE — Telephone Encounter (Signed)
He called and left a message. He canceled his 4/14 CT appt. He will call the office back once he reschedules the CT scan.

## 2020-05-15 ENCOUNTER — Ambulatory Visit (HOSPITAL_COMMUNITY): Payer: Medicare Other

## 2020-05-16 ENCOUNTER — Inpatient Hospital Stay: Payer: Medicare Other | Admitting: Hematology and Oncology

## 2020-05-21 ENCOUNTER — Other Ambulatory Visit: Payer: Medicare Other

## 2020-05-23 ENCOUNTER — Telehealth: Payer: Self-pay | Admitting: Physician Assistant

## 2020-05-23 NOTE — Telephone Encounter (Signed)
Pt informed

## 2020-05-23 NOTE — Telephone Encounter (Signed)
Yes. Hopefully with the addition of BuSpar he will not need the Klonopin as often.

## 2020-05-23 NOTE — Telephone Encounter (Signed)
Next visit is 07/28/20. Richard Wilson wants to know if he should continue taking his Buspar with the Klonopin? His phone number is [phone].

## 2020-05-23 NOTE — Telephone Encounter (Signed)
Please review

## 2020-06-03 ENCOUNTER — Telehealth: Payer: Self-pay | Admitting: Hematology and Oncology

## 2020-06-03 ENCOUNTER — Telehealth: Payer: Self-pay

## 2020-06-03 NOTE — Telephone Encounter (Signed)
-----   Message from [name], MD sent at 06/03/2020 10:14 AM EDT ----- Regarding: RE: CT cancelled Can you ask if he wants future follow-up here for his abnormal CBC? If not, please document and advise him to follow with PCP only ----- Message ----- From: Veverly Fells, RN Sent: 06/03/2020  10:10 AM EDT To: [name], MD Subject: CT cancelled                                   PT called and left VM stating that he wanted to update Dr. Alvy Bimler that he has cancelled his upcoming CT.

## 2020-06-03 NOTE — Telephone Encounter (Signed)
Scheduled appt per 5/3 sch msg. Pt aware.  

## 2020-06-03 NOTE — Telephone Encounter (Signed)
Spoke with Pt per MD Alvy Bimler. PT states that he does not want "to drink all that liquid in the morning" before a CT. PT states "can't I get an x-ray instead?" This RN explained the difference between and x-ray and CT and importance of CT images. PT states that he wants to continue to follow-up at [facility] with MD Alvy Bimler. MD Alvy Bimler notified.

## 2020-06-03 NOTE — Telephone Encounter (Signed)
Spoke with pt per MD Alvy Bimler regarding a follow-up appt after his last cancelled CT scan. Pt verbalizes understanding for upcoming appts at [facility] on 06/04/20.

## 2020-06-04 ENCOUNTER — Other Ambulatory Visit: Payer: Self-pay

## 2020-06-04 ENCOUNTER — Encounter: Payer: Self-pay | Admitting: Hematology and Oncology

## 2020-06-04 ENCOUNTER — Inpatient Hospital Stay: Payer: Medicare Other

## 2020-06-04 ENCOUNTER — Inpatient Hospital Stay: Payer: Medicare Other | Attending: Hematology and Oncology | Admitting: Hematology and Oncology

## 2020-06-04 DIAGNOSIS — D45 Polycythemia vera: Secondary | ICD-10-CM

## 2020-06-04 DIAGNOSIS — E65 Localized adiposity: Secondary | ICD-10-CM

## 2020-06-04 DIAGNOSIS — D751 Secondary polycythemia: Secondary | ICD-10-CM

## 2020-06-04 DIAGNOSIS — Z7982 Long term (current) use of aspirin: Secondary | ICD-10-CM | POA: Diagnosis not present

## 2020-06-04 DIAGNOSIS — D696 Thrombocytopenia, unspecified: Secondary | ICD-10-CM | POA: Diagnosis not present

## 2020-06-04 DIAGNOSIS — E669 Obesity, unspecified: Secondary | ICD-10-CM | POA: Diagnosis not present

## 2020-06-04 DIAGNOSIS — Z79899 Other long term (current) drug therapy: Secondary | ICD-10-CM | POA: Insufficient documentation

## 2020-06-04 LAB — CBC WITH DIFFERENTIAL/PLATELET
Abs Immature Granulocytes: 0.02 10*3/uL (ref 0.00–0.07)
Basophils Absolute: 0 10*3/uL (ref 0.0–0.1)
Basophils Relative: 1 %
Eosinophils Absolute: 0.2 10*3/uL (ref 0.0–0.5)
Eosinophils Relative: 4 %
HCT: 53.4 % — ABNORMAL HIGH (ref 39.0–52.0)
Hemoglobin: 17.4 g/dL — ABNORMAL HIGH (ref 13.0–17.0)
Immature Granulocytes: 0 %
Lymphocytes Relative: 21 %
Lymphs Abs: 1.2 10*3/uL (ref 0.7–4.0)
MCH: 28.2 pg (ref 26.0–34.0)
MCHC: 32.6 g/dL (ref 30.0–36.0)
MCV: 86.5 fL (ref 80.0–100.0)
Monocytes Absolute: 0.5 10*3/uL (ref 0.1–1.0)
Monocytes Relative: 9 %
Neutro Abs: 3.7 10*3/uL (ref 1.7–7.7)
Neutrophils Relative %: 65 %
Platelets: 141 10*3/uL — ABNORMAL LOW (ref 150–400)
RBC: 6.17 MIL/uL — ABNORMAL HIGH (ref 4.22–5.81)
RDW: 18 % — ABNORMAL HIGH (ref 11.5–15.5)
WBC: 5.6 10*3/uL (ref 4.0–10.5)
nRBC: 0 % (ref 0.0–0.2)

## 2020-06-04 NOTE — Progress Notes (Signed)
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 from other causes such as undiagnosed sleep apnea could be a potential cause For now, I do not recommend phlebotomy or aspirin therapy We discussed the risk and benefits of referring him for sleep study After long discussion, we are in agreement to hold off I told the patient obstructive sleep apnea is common for people who are a bit overweight I recommend the patient to consider increase activity and attempt to lose weight I will see him again in 3 months for further follow-up and if his hemoglobin is high, we can consider phlebotomy at that time I recommend him to take 81 mg aspirin daily  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 It is possible that he may have undiagnosed obstructive sleep apnea as potential cause of secondary erythrocytosis Previously, I have also recommended CT imaging of the abdomen and pelvis to rule out hepatosplenomegaly as a cause of his thrombocytopenia but the patient is somewhat reluctant and has canceled the appointment several times For now, we are in agreement to try to eat healthy and lose some weight If he does have fatty liver disease, that should improve with weight loss  Thrombocytopenia (HCC) His thrombocytopenia is not consistent with bone marrow disorder It is possible that he might have undiagnosed liver cirrhosis or splenomegaly I recommend CT imaging for further evaluation; as above, the patient has decided to cancel several times He is not symptomatic For now, I recommend observation only    No orders of the defined types were placed in this encounter.   The total time spent in the appointment was 20 minutes encounter with patients including review of chart and various tests results, discussions about plan of care and  coordination of care plan   All questions were answered. The patient knows to call the clinic with any problems, questions or concerns. No barriers to learning was detected.    [name], MD 5/4/202212:24 PM  INTERVAL HISTORY: Richard Wilson 70 y.o. male returns for further follow-up Since last time I saw him, I had recommended CT imaging of the abdomen and pelvis The patient called and rescheduled that several times Ultimately, a sensed reluctance for him to proceed with imaging study He returns today for further follow-up Since last time I saw him, he feels well and is completely asymptomatic  SUMMARY OF HEMATOLOGIC HISTORY: Oncology History  Secondary erythrocytosis  04/25/2020 Bone Marrow Biopsy   BONE MARROW, ASPIRATE, CLOT, CORE:  -  Mildly hypercellular bone marrow with trilineage hematopoiesis with adequate maturation  -  See comment   PERIPHERAL BLOOD:  -  Erythrocytosis  -  Minimal thrombocytopenia  -  See CBC data   COMMENT:   The bone marrow shows trilineage hematopoiesis with a full spectrum of maturation, without significant dysplasia or increase in blasts.  There is no definitive morphologic evidence of involvement by a myeloid  neoplasm.  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  Correlation with clinical impressions, other laboratory data (eg. CBC trends), and pending cytogenetic results is recommended.  Cytogenetics are normal     I have reviewed the past medical history, past surgical history,  social history and family history with the patient and they are unchanged from previous note.  ALLERGIES:  is allergic to garamycin [gentamicin] and penicillins.  MEDICATIONS:  Current Outpatient Medications  Medication Sig Dispense  Refill  . aspirin EC 81 MG tablet Take 81 mg by mouth daily. Swallow whole.    . ARIPiprazole (ABILIFY) 5 MG tablet Take 1 tablet (5 mg total) by mouth daily. 90 tablet 1  . busPIRone (BUSPAR) 15 MG tablet 1/3 po bid for 1 week, then 2/3 po bid for 1 week, then 1 po bid. 60 tablet 1  . clonazePAM (KLONOPIN) 1 MG tablet TAKE 1 TABLET(1 MG) BY MOUTH THREE TIMES DAILY AS NEEDED FOR ANXIETY 90 tablet 1  . lamoTRIgine (LAMICTAL) 100 MG tablet Take 100 mg by mouth daily.    . sertraline (ZOLOFT) 100 MG tablet TAKE 2 TABLETS(200 MG) BY MOUTH DAILY 180 tablet 0   No current facility-administered medications for this visit.     REVIEW OF SYSTEMS:   Constitutional: Denies fevers, chills or night sweats Eyes: Denies blurriness of vision Ears, nose, mouth, throat, and face: Denies mucositis or sore throat Respiratory: Denies cough, dyspnea or wheezes Cardiovascular: Denies palpitation, chest discomfort or lower extremity swelling Gastrointestinal:  Denies nausea, heartburn or change in bowel habits Skin: Denies abnormal skin rashes Lymphatics: Denies new lymphadenopathy or easy bruising Neurological:Denies numbness, tingling or new weaknesses Behavioral/Psych: Mood is stable, no new changes  All other systems were reviewed with the patient and are negative.  PHYSICAL EXAMINATION: ECOG PERFORMANCE STATUS: 0 - Asymptomatic  Vitals:   06/04/20 0951  BP: 130/68  Pulse: (!) 56  Resp: 20  Temp: (!) 97.2 F (36.2 C)  SpO2: 98%   Filed Weights   06/04/20 0951  Weight: 194 lb 6.4 oz (88.2 kg)    GENERAL:alert, no distress and comfortable NEURO: alert & oriented x 3 with fluent speech, no focal motor/sensory deficits  LABORATORY DATA:  I have reviewed the data as listed  No results found for: NA, K, CL, CO2, GLUCOSE, BUN, CREATININE, CALCIUM, PROT, ALBUMIN, AST, ALT, ALKPHOS, BILITOT, GFRNONAA, GFRAA  No results found for: SPEP, UPEP  Lab Results  Component Value Date   WBC 5.6  06/04/2020   NEUTROABS 3.7 06/04/2020   HGB 17.4 (H) 06/04/2020   HCT 53.4 (H) 06/04/2020   MCV 86.5 06/04/2020   PLT 141 (L) 06/04/2020      Chemistry   No results found for: NA, K, CL, CO2, BUN, CREATININE, GLU No results found for: CALCIUM, ALKPHOS, AST, ALT, BILITOT

## 2020-06-04 NOTE — Assessment & Plan Note (Addendum)
His thrombocytopenia is not consistent with bone marrow disorder It is possible that he might have undiagnosed liver cirrhosis or splenomegaly I recommend CT imaging for further evaluation; as above, the patient has decided to cancel several times He is not symptomatic For now, I recommend observation only

## 2020-06-04 NOTE — Assessment & Plan Note (Signed)
He is noted to have central obesity It is possible that he may have undiagnosed obstructive sleep apnea as potential cause of secondary erythrocytosis Previously, I have also recommended CT imaging of the abdomen and pelvis to rule out hepatosplenomegaly as a cause of his thrombocytopenia but the patient is somewhat reluctant and has canceled the appointment several times For now, we are in agreement to try to eat healthy and lose some weight If he does have fatty liver disease, that should improve with weight loss

## 2020-06-04 NOTE — Assessment & Plan Note (Signed)
His bone marrow biopsy is not consistent with polycythemia vera Secondary erythrocytosis from other causes such as undiagnosed sleep apnea could be a potential cause For now, I do not recommend phlebotomy or aspirin therapy We discussed the risk and benefits of referring him for sleep study After long discussion, we are in agreement to hold off I told the patient obstructive sleep apnea is common for people who are a bit overweight I recommend the patient to consider increase activity and attempt to lose weight I will see him again in 3 months for further follow-up and if his hemoglobin is high, we can consider phlebotomy at that time I recommend him to take 81 mg aspirin daily

## 2020-06-05 ENCOUNTER — Ambulatory Visit: Payer: Medicare Other | Admitting: Hematology and Oncology

## 2020-06-09 ENCOUNTER — Telehealth: Payer: Self-pay | Admitting: Physician Assistant

## 2020-06-09 NOTE — Telephone Encounter (Signed)
Next visit is 07/28/20. Davinder called to inform for the past 203 months he has had tremors in his hands and if he picks something up such as a glass or piece of paper his hands shake then also. Would this be a side effect from one of his medications? His phone number is [phone]

## 2020-06-09 NOTE — Telephone Encounter (Signed)
Please review

## 2020-06-16 NOTE — Telephone Encounter (Signed)
He saw Neuro, [name] for the tremor in Dec. He and I discussed that visit at our visit 02/04/2020. Yes, it can be from the Abilify.  He told me then that the Abilify has helped him a lot and doesn't want to go off it. "I can deal with the tremor. It doesn't bother me that bad." Is the tremor worse?

## 2020-06-17 NOTE — Telephone Encounter (Signed)
He is on Abilify 5 mg, have him take one half of a pill daily for 1 week and then stop it.  Those pills are really small from what I hear, if he is unable to cut it accurately, that is okay.  It does not have to be exact.  If unable to cut, buy a little piece off and take that every morning for a week and then stop.

## 2020-06-17 NOTE — Telephone Encounter (Signed)
Rtc to pt and he says yes the tremors have gotten worse to the point where he wants to stop the Abilify.

## 2020-06-17 NOTE — Telephone Encounter (Signed)
Pt informed

## 2020-07-09 ENCOUNTER — Other Ambulatory Visit: Payer: Self-pay

## 2020-07-09 ENCOUNTER — Ambulatory Visit (INDEPENDENT_AMBULATORY_CARE_PROVIDER_SITE_OTHER): Payer: Medicare Other | Admitting: Physician Assistant

## 2020-07-09 ENCOUNTER — Encounter: Payer: Self-pay | Admitting: Physician Assistant

## 2020-07-09 DIAGNOSIS — F330 Major depressive disorder, recurrent, mild: Secondary | ICD-10-CM | POA: Diagnosis not present

## 2020-07-09 DIAGNOSIS — G251 Drug-induced tremor: Secondary | ICD-10-CM

## 2020-07-09 DIAGNOSIS — R37 Sexual dysfunction, unspecified: Secondary | ICD-10-CM | POA: Diagnosis not present

## 2020-07-09 DIAGNOSIS — F411 Generalized anxiety disorder: Secondary | ICD-10-CM

## 2020-07-09 MED ORDER — BUSPIRONE HCL 15 MG PO TABS: 15.0000 mg | ORAL_TABLET | Freq: Three times a day (TID) | ORAL | 0 refills | Status: DC

## 2020-07-09 MED ORDER — CLONAZEPAM 1 MG PO TABS: ORAL_TABLET | ORAL | 1 refills | Status: DC

## 2020-07-09 MED ORDER — SERTRALINE HCL 100 MG PO TABS: ORAL_TABLET | ORAL | 0 refills | Status: DC

## 2020-07-09 NOTE — Progress Notes (Signed)
[facility] Med Check  Patient ID: Richard Wilson,  MRN: [id number]  PCP: Richard Roles, PA  Date of Evaluation: 07/09/2020 Time spent:30 minutes  Chief Complaint:  Chief Complaint    Anxiety; Depression; Insomnia; Follow-up      HISTORY/CURRENT STATUS: HPI For routine med check.  Since LOV, Buspar was added. Abilify was stopped d/t tremor. That is improving. Buspar is helping some. Anxiety is about 25% better than it was before starting the Buspar.  Needs the Klonopin daily.  If he does not take it usually 3 times a day, he feels generalized anxiety like something bad is going to happen.  He does get panicky a lot of days too.  No full-blown panic attacks.  Patient denies loss of interest in usual activities and is able to enjoy things.  Denies decreased energy or motivation.  He is volunteering at his church which gives him something to do and he finds joy in.  Appetite has not changed.  No extreme sadness, tearfulness, or feelings of hopelessness. Denies suicidal or homicidal thoughts.  Patient denies increased energy with decreased need for sleep, no increased talkativeness, no racing thoughts, no impulsivity or risky behaviors, no increased spending, no increased libido, no grandiosity, no increased irritability or anger, no paranoia, and no hallucinations.  Denies dizziness, syncope, seizures, numbness, tingling, tremor, tics, unsteady gait, slurred speech, confusion. Denies muscle or joint pain, stiffness, or dystonia.  Individual Medical History/ Review of Systems: Changes? :No    Past medications for mental health diagnoses include: Rozerem, Trazodone caused vivid dreams, Xanax, Buspar, mirtazapine caused excessive somnolence and fatigue with flulike symptoms, Belsomra, propranolol for tremor, Zoloft, Abilify caused Klonopin, Effexor, Ativan, Wellbutrin, Nardil, lithium, gabapentin, Librium  Pertinent info after reviewing Richard Wilson notes, his previous  psychiatrist in [facility]. Past suicide attempt in September 1996, by taking Tylenol.  He was admitted at that time to a psychiatric hospital in [facility].  Admitted to psych hospital multiple times in the past for "nervous breakdowns."  January 1985, February 1986, October 1991, September 1996, July 2000.  Allergies: Garamycin [gentamicin] and Penicillins  Current Medications:  Current Outpatient Medications:  .  aspirin EC 81 MG tablet, Take 81 mg by mouth daily. Swallow whole., Disp: , Rfl:  .  lamoTRIgine (LAMICTAL) 100 MG tablet, Take 100 mg by mouth daily., Disp: , Rfl:  .  traZODone (DESYREL) 150 MG tablet, Take by mouth., Disp: , Rfl:  .  busPIRone (BUSPAR) 15 MG tablet, Take 1 tablet (15 mg total) by mouth 3 (three) times daily., Disp: 270 tablet, Rfl: 0 .  clonazePAM (KLONOPIN) 1 MG tablet, TAKE 1 TABLET(1 MG) BY MOUTH THREE TIMES DAILY AS NEEDED FOR ANXIETY, Disp: 90 tablet, Rfl: 1 .  sertraline (ZOLOFT) 100 MG tablet, TAKE 2 TABLETS(200 MG) BY MOUTH DAILY, Disp: 180 tablet, Rfl: 0 Medication Side Effects: sexual dysfunction  Family Medical/ Social History: Changes?  He was in a relationship recently but he is in the other gentleman parted ways because Richard Wilson wanted a romance and the other man wanted a friendship.  MENTAL HEALTH EXAM:  There were no vitals taken for this visit.There is no height or weight on file to calculate BMI.  General Appearance: Casual and Well Groomed  Eye Contact:  Good  Speech:  Clear and Coherent and Normal Rate  Volume:  Normal  Mood:  Anxious  Affect:  Anxious  Thought Process:  Goal Directed and Descriptions of Associations: Intact  Orientation:  Full (Time,  Place, and Person)  Thought Content: Logical   Suicidal Thoughts:  No  Homicidal Thoughts:  No  Memory:  WNL  Judgement:  Good  Insight:  Good  Psychomotor Activity:  Normal and No tremor at the present time  Concentration:  Concentration: Good   Recall:  Good  Fund of Knowledge: Good  Language: Good  Assets:  Desire for Improvement  ADL's:  Intact  Cognition: WNL  Prognosis:  Good    DIAGNOSES:    ICD-10-CM   1. 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  I provided 30 mins of face to face time during this encounter including time spent before and after the visit in records review, medical decision making, and charting.  I am glad to see him doing a little better since adding the BuSpar.  I think we could have even more improvement though.  Recommend increasing to 3 times daily.  He understands and agrees. We discussed the sexual dysfunction again.  It is likely related to the Zoloft.  I do not want to change too many things today but we can consider adding Wellbutrin in the future, concerns of increased anxiety with that though.  Another option would be to decrease Zoloft or even stop it.  The concern with that though his increased anxiety and depression.  Another thought is increasing the Lamictal especially if we wean off Zoloft.  Will address at the next appointment. Discontinued Abilify b/c tremor.  Increase Buspar 15 mg to 1 po tid.  Continue Klonopin 1 mg, 1 p.o. 3 times daily as needed. Continue Lamictal 100 mg, 1 p.o. daily. Continue Zoloft 100 mg, 2 p.o. daily. Recommended counseling. Return in 2 months.  Donnal Moat, PA-C

## 2020-07-17 ENCOUNTER — Ambulatory Visit (INDEPENDENT_AMBULATORY_CARE_PROVIDER_SITE_OTHER): Payer: Medicare Other

## 2020-07-17 ENCOUNTER — Other Ambulatory Visit: Payer: Self-pay

## 2020-07-17 ENCOUNTER — Ambulatory Visit (HOSPITAL_COMMUNITY): Payer: Medicare Other

## 2020-07-17 ENCOUNTER — Encounter (HOSPITAL_COMMUNITY): Payer: Self-pay

## 2020-07-17 ENCOUNTER — Ambulatory Visit (HOSPITAL_COMMUNITY)
Admission: EM | Admit: 2020-07-17 | Discharge: 2020-07-17 | Disposition: A | Discharge status: Home / Self Care | Payer: Medicare Other

## 2020-07-17 DIAGNOSIS — S76012A Strain of muscle, fascia and tendon of left hip, initial encounter: Secondary | ICD-10-CM | POA: Diagnosis not present

## 2020-07-17 DIAGNOSIS — M25552 Pain in left hip: Secondary | ICD-10-CM | POA: Diagnosis not present

## 2020-07-17 MED ORDER — PREDNISONE 20 MG PO TABS: ORAL_TABLET | ORAL | 0 refills | Status: DC

## 2020-07-17 NOTE — ED Provider Notes (Signed)
[facility]   MRN: [id number] DOB: Jan 30, 1951  Subjective:   Richard Wilson is a 70 y.o. male presenting for 2-day history of cute onset posterior left superior hip pain.  Symptoms rated 10 out of 10, worse when he tries to sit.  Patient states has been using meloxicam and tizanidine with minimal relief.  Denies any particular fall, trauma, back pain, radicular symptoms, weakness, numbness or tingling, swelling or redness.  No history of arthritis of his hip.  The only inciting factor he can think of is that he recently tried to flip his mattress and cannot recall that his pain started thereafter.    No current facility-administered medications for this encounter.  Current Outpatient Medications:    meloxicam (MOBIC) 15 MG tablet, 1 tablet, Disp: , Rfl:    tiZANidine (ZANAFLEX) 4 MG tablet, 1 tablet as needed, Disp: , Rfl:    aspirin EC 81 MG tablet, Take 81 mg by mouth daily. Swallow whole., Disp: , Rfl:    busPIRone (BUSPAR) 15 MG tablet, Take 1 tablet (15 mg total) by mouth 3 (three) times daily., Disp: 270 tablet, Rfl: 0   clonazePAM (KLONOPIN) 1 MG tablet, TAKE 1 TABLET(1 MG) BY MOUTH THREE TIMES DAILY AS NEEDED FOR ANXIETY, Disp: 90 tablet, Rfl: 1   lamoTRIgine (LAMICTAL) 100 MG tablet, Take 100 mg by mouth daily., Disp: , Rfl:    sertraline (ZOLOFT) 100 MG tablet, TAKE 2 TABLETS(200 MG) BY MOUTH DAILY, Disp: 180 tablet, Rfl: 0   traZODone (DESYREL) 150 MG tablet, Take by mouth., Disp: , Rfl:    Allergies  Allergen Reactions   Garamycin [Gentamicin] Other (See Comments)    Eyes itched and were edematous   Penicillins     Past Medical History:  Diagnosis Date   Anxiety    Depression    Hyperlipidemia    Tremor      Past Surgical History:  Procedure Laterality Date   APPENDECTOMY     TONSILLECTOMY      Family History  Problem Relation Age of Onset   Healthy Mother    Prostate cancer Father    Throat cancer Maternal Grandfather    Heart attack  Paternal Grandfather     Social History   Tobacco Use   Smoking status: Never   Smokeless tobacco: Never  Vaping Use   Vaping Use: Never used  Substance Use Topics   Alcohol use: Yes    Alcohol/week: 7.0 standard drinks    Types: 7 Glasses of wine per week   Drug use: Never    ROS   Objective:   Vitals: BP 112/69 (BP Location: Right Arm)   Pulse 69   Temp 97.9 F (36.6 C) (Oral)   Resp 18   SpO2 96%   Physical Exam Constitutional:      General: He is not in acute distress.    Appearance: Normal appearance. He is well-developed and normal weight. He is not ill-appearing, toxic-appearing or diaphoretic.  HENT:     Head: Normocephalic and atraumatic.     Right Ear: External ear normal.     Left Ear: External ear normal.     Nose: Nose normal.     Mouth/Throat:     Pharynx: Oropharynx is clear.  Eyes:     General: No scleral icterus.       Right eye: No discharge.        Left eye: No discharge.     Extraocular Movements: Extraocular movements intact.  Pupils: Pupils are equal, round, and reactive to light.  Cardiovascular:     Rate and Rhythm: Normal rate.  Pulmonary:     Effort: Pulmonary effort is normal.  Musculoskeletal:     Cervical back: Normal range of motion.     Left hip: Tenderness (focal over area outlined) present. No deformity, lacerations, bony tenderness or crepitus. Normal range of motion. Normal strength.       Legs:  Skin:    General: Skin is warm and dry.  Neurological:     Mental Status: He is alert and oriented to person, place, and time.  Psychiatric:        Mood and Affect: Mood normal.        Behavior: Behavior normal.        Thought Content: Thought content normal.        Judgment: Judgment normal.    DG Hip Unilat W or Wo Pelvis 2-3 Views Left  Result Date: 07/17/2020 CLINICAL DATA:  Pain EXAM: DG HIP (WITH OR WITHOUT PELVIS) 2-3V LEFT COMPARISON:  February 20, 2020. FINDINGS: Frontal pelvis as well as frontal and lateral  left hip images were obtained. No fracture or dislocation. Hip and sacroiliac joint spaces appear unremarkable. No erosive change. Degenerative change noted in mid lumbar spine IMPRESSION: No fracture or spondylolisthesis. No appreciable hip joint arthropathy. Degenerative change in mid lumbar spine. Electronically Signed   By: Lowella Grip III M.D.   On: 07/17/2020 10:39     Assessment and Plan :   PDMP not reviewed this encounter.  1. Acute hip pain, left   2. Hip strain, left, initial encounter     Patient has severe pain despite taking meloxicam and tizanidine.  Suspect hip strain and given his lack of response to the aforementioned medications will use an oral prednisone course.  Recommended follow-up with orthopedist. Counseled patient on potential for adverse effects with medications prescribed/recommended today, ER and return-to-clinic precautions discussed, patient verbalized understanding.    Jaynee Eagles, PA-C 07/17/20 1053

## 2020-07-17 NOTE — ED Triage Notes (Signed)
Pt reports left sided lower back pain, radiates to right leg x 2 days. Reports he started taking tizanide and Meloxicam gives no relief.

## 2020-07-25 ENCOUNTER — Ambulatory Visit (INDEPENDENT_AMBULATORY_CARE_PROVIDER_SITE_OTHER): Payer: Medicare Other

## 2020-07-25 ENCOUNTER — Ambulatory Visit (HOSPITAL_COMMUNITY)
Admission: EM | Admit: 2020-07-25 | Discharge: 2020-07-25 | Disposition: A | Discharge status: Home / Self Care | Payer: Medicare Other | Attending: Medical Oncology | Admitting: Medical Oncology

## 2020-07-25 ENCOUNTER — Encounter (HOSPITAL_COMMUNITY): Payer: Self-pay

## 2020-07-25 DIAGNOSIS — R058 Other specified cough: Secondary | ICD-10-CM

## 2020-07-25 DIAGNOSIS — R093 Abnormal sputum: Secondary | ICD-10-CM

## 2020-07-25 DIAGNOSIS — J069 Acute upper respiratory infection, unspecified: Secondary | ICD-10-CM | POA: Diagnosis not present

## 2020-07-25 DIAGNOSIS — R059 Cough, unspecified: Secondary | ICD-10-CM | POA: Diagnosis not present

## 2020-07-25 MED ORDER — ALBUTEROL SULFATE HFA 108 (90 BASE) MCG/ACT IN AERS: 1.0000 | INHALATION_SPRAY | Freq: Four times a day (QID) | RESPIRATORY_TRACT | 0 refills | Status: DC | PRN

## 2020-07-25 MED ORDER — BENZONATATE 200 MG PO CAPS: 200.0000 mg | ORAL_CAPSULE | Freq: Three times a day (TID) | ORAL | 0 refills | Status: DC | PRN

## 2020-07-25 NOTE — ED Triage Notes (Signed)
Pt c/o coughing up phlegm and runny nose X 4 days. Pt states he was seen Tuesday and has not gotten better.   Pt states he was given Benzonatate and Flonase.   States he has not seen much improvement.

## 2020-07-25 NOTE — ED Provider Notes (Signed)
[place]    CSN: [id number] Arrival date & time: 07/25/20  1525      History   Chief Complaint Chief Complaint  Patient presents with   Cough   Nasal Congestion    HPI Richard Wilson is a 70 y.o. male.   HPI  Cold Symptoms: Patient states that he has had cough and cold symptoms for the past 6 days.  He states that the coughs is dry but also productive at times.  No hemoptysis.  No chest pain, significant shortness of breath but has had some mild intermittent wheezing.  In addition he continues to have nasal congestion despite taking Flonase and continues having a cough despite taking Tessalon 100. NO fevers.   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 Authorizing Provider  aspirin EC 81 MG tablet Take 81 mg by mouth daily. Swallow whole.    [provider]  busPIRone (BUSPAR) 15 MG tablet Take 1 tablet (15 mg total) by mouth 3 (three) times daily. 07/09/20   Donnal Moat T, PA-C  clonazePAM (KLONOPIN) 1 MG tablet TAKE 1 TABLET(1 MG) BY MOUTH THREE TIMES DAILY AS NEEDED FOR ANXIETY 07/09/20   Donnal Moat T, PA-C  lamoTRIgine (LAMICTAL) 100 MG tablet Take 100 mg by mouth daily.    [provider]  meloxicam (MOBIC) 15 MG tablet 1 tablet 08/16/17   [provider]  predniSONE (DELTASONE) 20 MG tablet Take 2 tablets daily with breakfast. 07/17/20   Jaynee Eagles, PA-C  sertraline (ZOLOFT) 100 MG tablet TAKE 2 TABLETS(200 MG) BY  MOUTH DAILY 07/09/20   Donnal Moat T, PA-C  tiZANidine (ZANAFLEX) 4 MG tablet 1 tablet as needed 07/16/20   [provider]  traZODone (DESYREL) 150 MG tablet Take by mouth. 06/26/20   [provider]    Family History Family History  Problem Relation Age of Onset   Healthy Mother    Prostate cancer Father    Throat cancer Maternal Grandfather    Heart attack Paternal Grandfather     Social History Social History   Tobacco Use   Smoking status: Never   Smokeless tobacco: Never  Vaping Use   Vaping Use: Never used  Substance Use Topics   Alcohol use: Yes    Alcohol/week: 7.0 standard drinks    Types: 7 Glasses of wine per week   Drug use: Never     Allergies   Garamycin [gentamicin] and Penicillins   Review of Systems Review of Systems  As stated above in HPI Physical Exam Triage Vital Signs ED Triage Vitals  Enc Vitals Group     BP 07/25/20 1604 117/75     Pulse Rate 07/25/20 1604 85     Resp 07/25/20 1604 17     Temp 07/25/20 1604 98.2 F (36.8 C)     Temp Source 07/25/20 1604 Oral     SpO2 07/25/20 1604 97 %  Weight --      Height --      Head Circumference --      Peak Flow --      Pain Score 07/25/20 1602 5     Pain Loc --      Pain Edu? --      Excl. in [place]? --    No data found.  Updated Vital Signs BP 117/75 (BP Location: Right Arm)   Pulse 85   Temp 98.2 F (36.8 C) (Oral)   Resp 17   SpO2 97%   Physical Exam Vitals and nursing note reviewed.  Constitutional:      General: He is not in acute distress.    Appearance: Normal appearance. He is not ill-appearing, toxic-appearing or diaphoretic.  HENT:     Head: Normocephalic and atraumatic.     Right Ear: Tympanic membrane, ear canal and external ear normal.     Left Ear: Tympanic membrane, ear canal and external ear normal.     Nose: Congestion and rhinorrhea present.     Mouth/Throat:     Mouth: Mucous membranes are moist.     Pharynx: Oropharynx is clear. No  oropharyngeal exudate or posterior oropharyngeal erythema.  Eyes:     Extraocular Movements: Extraocular movements intact.     Pupils: Pupils are equal, round, and reactive to light.  Cardiovascular:     Rate and Rhythm: Normal rate.     Heart sounds: Normal heart sounds.  Pulmonary:     Effort: Pulmonary effort is normal.     Breath sounds: Wheezing (mild throughout) and rhonchi (mild throughout) present.  Musculoskeletal:     Cervical back: Normal range of motion and neck supple.  Lymphadenopathy:     Cervical: No cervical adenopathy.  Skin:    General: Skin is warm.  Neurological:     Mental Status: He is alert and oriented to person, place, and time.     UC Treatments / Results  Labs (all labs ordered are listed, but only abnormal results are displayed) Labs Reviewed - No data to display  EKG   Radiology No results found.  Procedures Procedures (including critical care time)  Medications Ordered in UC Medications - No data to display  Initial Impression / Assessment and Plan / UC Course  I have reviewed the triage vital signs and the nursing notes.  Pertinent labs & imaging results that were available during my care of the patient were reviewed by me and considered in my medical decision making (see chart for details).     New.  Chest x-ray shows bronchitis.  Given his health history I am going to treat him with increased Tessalon as well as albuterol.  Discussed how to use this medication.  He will continue to use the Flonase as well.  Discussed red flag signs symptoms. Final Clinical Impressions(s) / UC Diagnoses   Final diagnoses:  None   Discharge Instructions   None    ED Prescriptions   None    PDMP not reviewed this encounter.   Hughie Closs, [place] 07/25/20 [phone]

## 2020-07-28 ENCOUNTER — Ambulatory Visit: Payer: Medicare Other | Admitting: Physician Assistant

## 2020-07-29 ENCOUNTER — Other Ambulatory Visit: Payer: Self-pay | Admitting: Family Medicine

## 2020-07-29 ENCOUNTER — Encounter (HOSPITAL_COMMUNITY): Payer: Self-pay | Admitting: Emergency Medicine

## 2020-07-29 ENCOUNTER — Other Ambulatory Visit: Payer: Self-pay

## 2020-07-29 ENCOUNTER — Ambulatory Visit (INDEPENDENT_AMBULATORY_CARE_PROVIDER_SITE_OTHER): Payer: Medicare Other

## 2020-07-29 ENCOUNTER — Ambulatory Visit (HOSPITAL_COMMUNITY)
Admission: EM | Admit: 2020-07-29 | Discharge: 2020-07-29 | Disposition: A | Discharge status: Home / Self Care | Payer: Medicare Other | Attending: Family Medicine | Admitting: Family Medicine

## 2020-07-29 DIAGNOSIS — J40 Bronchitis, not specified as acute or chronic: Secondary | ICD-10-CM

## 2020-07-29 DIAGNOSIS — R059 Cough, unspecified: Secondary | ICD-10-CM

## 2020-07-29 MED ORDER — PROMETHAZINE-DM 6.25-15 MG/5ML PO SYRP: 5.0000 mL | ORAL_SOLUTION | Freq: Four times a day (QID) | ORAL | 0 refills | Status: DC | PRN

## 2020-07-29 MED ORDER — ALBUTEROL SULFATE HFA 108 (90 BASE) MCG/ACT IN AERS: 1.0000 | INHALATION_SPRAY | Freq: Four times a day (QID) | RESPIRATORY_TRACT | 0 refills | Status: DC | PRN

## 2020-07-29 MED ORDER — PREDNISONE 20 MG PO TABS: 40.0000 mg | ORAL_TABLET | Freq: Every day | ORAL | 0 refills | Status: DC

## 2020-07-29 NOTE — ED Provider Notes (Signed)
[place]    CSN: [id number] Arrival date & time: 07/29/20  4944      History   Chief Complaint Chief Complaint  Patient presents with   Cough   Fatigue    HPI Richard Wilson is a 70 y.o. male.   Patient presenting today following up on hacking cough, fatigue, chest tightness, wheezing for which she was seen 4 days ago.  Symptoms started almost 2 weeks ago now and seem to be worsening and not improving.  Was given albuterol inhaler and Tessalon [place] and Flonase at last visit, using the latter 2 items with no relief but did not pick up the albuterol inhaler as he did not think he would need it.  Noticed the wheezing the past few days and some trouble breathing.  Denies notice of fever, chest pain, abdominal pain.  No known past history of pulmonary disease.   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 Authorizing Provider  predniSONE (DELTASONE) 20 MG tablet Take 2 tablets (40 mg total) by mouth daily with breakfast. 07/29/20  Yes Volney American, PA-C  promethazine-dextromethorphan (PROMETHAZINE-DM) 6.25-15 MG/5ML syrup Take 5 mLs by mouth 4 (four) times daily as needed for cough. 07/29/20  Yes Volney American, PA-C  albuterol (VENTOLIN HFA) 108 (90 Base) MCG/ACT inhaler Inhale 1-2 puffs into the lungs every 6 (six) hours as needed for wheezing or shortness of breath. 07/29/20   Volney American, PA-C  aspirin EC 81 MG tablet Take  81 mg by mouth daily. Swallow whole.    [provider]  benzonatate (TESSALON) 200 MG capsule Take 1 capsule (200 mg total) by mouth 3 (three) times daily as needed for cough. 07/25/20   Hughie Closs, PA-C  busPIRone (BUSPAR) 15 MG tablet Take 1 tablet (15 mg total) by mouth 3 (three) times daily. 07/09/20   Donnal Moat T, PA-C  clonazePAM (KLONOPIN) 1 MG tablet TAKE 1 TABLET(1 MG) BY MOUTH THREE TIMES DAILY AS NEEDED FOR ANXIETY 07/09/20   Donnal Moat T, PA-C  lamoTRIgine (LAMICTAL) 100 MG tablet Take 100 mg by mouth daily.    [provider]  meloxicam (MOBIC) 15 MG tablet 1 tablet 08/16/17   [provider]  predniSONE (DELTASONE) 20 MG tablet Take 2 tablets daily with breakfast. 07/17/20   Jaynee Eagles, PA-C  sertraline (ZOLOFT) 100 MG tablet TAKE 2 TABLETS(200 MG) BY MOUTH DAILY 07/09/20   Donnal Moat T, PA-C  tiZANidine (ZANAFLEX) 4 MG tablet 1 tablet as needed 07/16/20   [provider]  traZODone (DESYREL) 150 MG tablet Take by mouth. 06/26/20   [provider]    Family History Family History  Problem Relation Age of Onset   Healthy Mother    Prostate cancer Father    Throat cancer Maternal Grandfather    Heart attack Paternal Grandfather     Social History Social History  Tobacco Use   Smoking status: Never   Smokeless tobacco: Never  Vaping Use   Vaping Use: Never used  Substance Use Topics   Alcohol use: Yes    Alcohol/week: 7.0 standard drinks    Types: 7 Glasses of wine per week   Drug use: Never     Allergies   Garamycin [gentamicin] and Penicillins   Review of Systems Review of Systems Per HPI  Physical Exam Triage Vital Signs ED Triage Vitals  Enc Vitals Group     BP 07/29/20 0944 102/72     Pulse Rate 07/29/20 0944 68     Resp 07/29/20 0944 16     Temp 07/29/20 0944 (!) 97.5 F (36.4 C)     Temp Source 07/29/20 0944 Oral     SpO2 07/29/20 0944 98 %     Weight --      Height --      Head  Circumference --      Peak Flow --      Pain Score 07/29/20 0943 0     Pain Loc --      Pain Edu? --      Excl. in [place]? --    No data found.  Updated Vital Signs BP 102/72   Pulse 68   Temp (!) 97.5 F (36.4 C) (Oral)   Resp 16   SpO2 98%   Visual Acuity Right Eye Distance:   Left Eye Distance:   Bilateral Distance:    Right Eye Near:   Left Eye Near:    Bilateral Near:     Physical Exam Vitals and nursing note reviewed.  Constitutional:      Appearance: Normal appearance.  HENT:     Head: Atraumatic.     Right Ear: Tympanic membrane normal.     Left Ear: Tympanic membrane normal.     Nose: Rhinorrhea present.     Mouth/Throat:     Mouth: Mucous membranes are moist.     Pharynx: Posterior oropharyngeal erythema present. No oropharyngeal exudate.  Eyes:     Extraocular Movements: Extraocular movements intact.     Conjunctiva/sclera: Conjunctivae normal.  Cardiovascular:     Rate and Rhythm: Normal rate and regular rhythm.     Heart sounds: Normal heart sounds.  Pulmonary:     Effort: Pulmonary effort is normal.     Breath sounds: Wheezing present. No rales.     Comments: Moderate diffuse wheezes bilaterally Abdominal:     General: Bowel sounds are normal. There is no distension.     Palpations: Abdomen is soft.     Tenderness: There is no abdominal tenderness. There is no guarding.  Musculoskeletal:        General: Normal range of motion.     Cervical back: Normal range of motion and neck supple.  Skin:    General: Skin is warm and dry.  Neurological:     General: No focal deficit present.     Mental Status: He is oriented to person, place, and time.  Psychiatric:        Mood and Affect: Mood normal.        Thought Content: Thought content normal.        Judgment: Judgment normal.   UC Treatments / Results  Labs (all labs ordered are listed, but only abnormal results are displayed) Labs Reviewed - No data to display  EKG  Radiology DG Chest 2  View  Result Date: 07/29/2020 CLINICAL DATA:  Cough 2  weeks EXAM: CHEST - 2 VIEW COMPARISON:  07/25/2020 FINDINGS: The heart size and mediastinal contours are within normal limits. Both lungs are clear. The visualized skeletal structures are unremarkable. IMPRESSION: No active cardiopulmonary disease. Electronically Signed   By: Franchot Gallo M.D.   On: 07/29/2020 11:04    Procedures Procedures (including critical care time)  Medications Ordered in UC Medications - No data to display  Initial Impression / Assessment and Plan / UC Course  I have reviewed the triage vital signs and the nursing notes.  Pertinent labs & imaging results that were available during my care of the patient were reviewed by me and considered in my medical decision making (see chart for details).     Vital signs reassuring and within normal limits, x-ray performed given duration and worsening course as well as progressive wheezing.  X-ray negative for acute pulmonary abnormality.  Will treat with prednisone burst, Phenergan DM, albuterol inhaler.  Discussed Mucinex and supportive home care.  Return for acutely worsening symptoms. Final Clinical Impressions(s) / UC Diagnoses   Final diagnoses:  Bronchitis   Discharge Instructions   None    ED Prescriptions     Medication Sig Dispense Auth. Provider   albuterol (VENTOLIN HFA) 108 (90 Base) MCG/ACT inhaler Inhale 1-2 puffs into the lungs every 6 (six) hours as needed for wheezing or shortness of breath. 1 each Volney American, PA-C   predniSONE (DELTASONE) 20 MG tablet Take 2 tablets (40 mg total) by mouth daily with breakfast. 10 tablet Volney American, PA-C   promethazine-dextromethorphan (PROMETHAZINE-DM) 6.25-15 MG/5ML syrup Take 5 mLs by mouth 4 (four) times daily as needed for cough. 100 mL Volney American, [place]      PDMP not reviewed this encounter.   Volney American, [place] 07/29/20 1201

## 2020-07-29 NOTE — ED Triage Notes (Signed)
Dx with bronchitis 6/24. Has been taking fluticasone and tessalon, not improving.

## 2020-08-08 ENCOUNTER — Ambulatory Visit (INDEPENDENT_AMBULATORY_CARE_PROVIDER_SITE_OTHER): Payer: Medicare Other

## 2020-08-08 ENCOUNTER — Ambulatory Visit (HOSPITAL_COMMUNITY)
Admission: EM | Admit: 2020-08-08 | Discharge: 2020-08-08 | Disposition: A | Discharge status: Home / Self Care | Payer: Medicare Other | Attending: Emergency Medicine | Admitting: Emergency Medicine

## 2020-08-08 ENCOUNTER — Other Ambulatory Visit: Payer: Self-pay

## 2020-08-08 ENCOUNTER — Encounter (HOSPITAL_COMMUNITY): Payer: Self-pay

## 2020-08-08 DIAGNOSIS — R059 Cough, unspecified: Secondary | ICD-10-CM | POA: Diagnosis not present

## 2020-08-08 DIAGNOSIS — R5383 Other fatigue: Secondary | ICD-10-CM | POA: Diagnosis not present

## 2020-08-08 DIAGNOSIS — Z20822 Contact with and (suspected) exposure to covid-19: Secondary | ICD-10-CM | POA: Diagnosis not present

## 2020-08-08 DIAGNOSIS — Z880 Allergy status to penicillin: Secondary | ICD-10-CM | POA: Insufficient documentation

## 2020-08-08 DIAGNOSIS — R058 Other specified cough: Secondary | ICD-10-CM | POA: Insufficient documentation

## 2020-08-08 MED ORDER — PREDNISONE 10 MG (21) PO TBPK: ORAL_TABLET | Freq: Every day | ORAL | 0 refills | Status: DC

## 2020-08-08 NOTE — ED Triage Notes (Addendum)
Pt c/o fatigue, coughing up phlegm since about 07/20/20. Reports has been seen for this but has persisted. Pt adds that he feels he may have a little wheezing.

## 2020-08-08 NOTE — ED Provider Notes (Signed)
[place]    CSN: [id number] Arrival date & time: 08/08/20  5329      History   Chief Complaint Chief Complaint  Patient presents with   Cough   Fatigue    HPI Richard Wilson is a 70 y.o. male.   Patient presents with productive cough that has been persistent for 1 month with associated fatigue.  States that the cough has improved at times but has not completely gone away.  Feels that he may be possibly wheezing at times as well.  Denies fevers, chills, body aches, ear pain or fullness, headaches, sore throat, nasal congestion, rhinorrhea, abdominal pain, nausea, vomiting, diarrhea.  Has been seen in urgent care for similar symptoms, initially had cold symptoms associated with cough which have now resolved.  Prior chest x-ray did show bronchitis.  Did use cough medicines and completed prednisone course however did not feel he was properly using the inhaler so did not use.   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 Authorizing Provider  aspirin EC 81 MG tablet Take 81 mg by mouth daily. Swallow whole.   Yes [provider]  busPIRone (BUSPAR) 15 MG tablet Take 1 tablet (15 mg total) by mouth 3 (three) times daily. 07/09/20  Yes Hurst, Helene Kelp T, PA-C  clonazePAM (KLONOPIN) 1 MG tablet TAKE 1 TABLET(1 MG) BY MOUTH THREE TIMES DAILY AS NEEDED FOR ANXIETY 07/09/20  Yes Hurst, Teresa T, PA-C  sertraline (ZOLOFT) 100 MG tablet TAKE 2 TABLETS(200 MG) BY  MOUTH DAILY 07/09/20  Yes Hurst, Teresa T, PA-C  traZODone (DESYREL) 150 MG tablet Take by mouth. 06/26/20  Yes [provider]  albuterol (VENTOLIN HFA) 108 (90 Base) MCG/ACT inhaler Inhale 1-2 puffs into the lungs every 6 (six) hours as needed for wheezing or shortness of breath. 07/29/20   Volney American, PA-C  benzonatate (TESSALON) 200 MG capsule Take 1 capsule (200 mg total) by mouth 3 (three) times daily as needed for cough. 07/25/20   Hughie Closs, PA-C  lamoTRIgine (LAMICTAL) 100 MG tablet Take 100 mg by mouth daily.    [provider]  meloxicam (MOBIC) 15 MG tablet 1 tablet 08/16/17   [provider]  predniSONE (DELTASONE) 20 MG tablet Take 2 tablets daily with breakfast. 07/17/20   Jaynee Eagles, PA-C  predniSONE (DELTASONE) 20 MG tablet Take 2 tablets (40 mg total) by mouth daily with breakfast. 07/29/20   Volney American, PA-C  promethazine-dextromethorphan (PROMETHAZINE-DM) 6.25-15 MG/5ML syrup Take 5 mLs by mouth 4 (four) times daily as needed for cough. 07/29/20   Volney American, PA-C  tiZANidine (ZANAFLEX) 4 MG tablet 1 tablet as needed 07/16/20   [provider]    Family History Family History  Problem Relation Age of Onset   Healthy Mother    Prostate cancer Father    Throat cancer Maternal Grandfather    Heart attack Paternal  Grandfather     Social History Social History   Tobacco Use   Smoking status: Never   Smokeless tobacco: Never  Vaping Use   Vaping Use: Never used  Substance Use Topics   Alcohol use: Yes    Alcohol/week: 7.0 standard drinks    Types: 7 Glasses of wine per week   Drug use: Never     Allergies   Garamycin [gentamicin] and Penicillins   Review of Systems Review of Systems  Constitutional:  Positive for fatigue. Negative for activity change, appetite change, chills, diaphoresis, fever and unexpected weight change.  HENT: Negative.    Respiratory:  Positive for apnea and cough.  Negative for choking, chest tightness, shortness of breath, wheezing and stridor.   Cardiovascular: Negative.   Gastrointestinal: Negative.   Skin: Negative.   Neurological: Negative.     Physical Exam Triage Vital Signs ED Triage Vitals [08/08/20 0942]  Enc Vitals Group     BP 102/74     Pulse Rate 71     Resp 18     Temp 98.3 F (36.8 C)     Temp src      SpO2 97 %     Weight      Height      Head Circumference      Peak Flow      Pain Score 0     Pain Loc      Pain Edu?      Excl. in [place]?    No data found.  Updated Vital Signs BP 102/74   Pulse 71   Temp 98.3 F (36.8 C)   Resp 18   SpO2 97%   Visual Acuity Right Eye Distance:   Left Eye Distance:   Bilateral Distance:    Right Eye Near:   Left Eye Near:    Bilateral Near:     Physical Exam Constitutional:      Appearance: Normal appearance. He is normal weight.  HENT:     Head: Normocephalic.  Eyes:     Extraocular Movements: Extraocular movements intact.  Cardiovascular:     Rate and Rhythm: Normal rate and regular rhythm.     Pulses: Normal pulses.     Heart sounds: Normal heart sounds.  Pulmonary:     Effort: Pulmonary effort is normal. No respiratory distress.     Breath sounds: Normal breath sounds. No stridor. No wheezing or rhonchi.     Comments: Witnessed dry nonproductive cough Skin:    General: Skin is warm and dry.  Neurological:     Mental Status: He is alert and oriented to person, place, and time. Mental status is at baseline.  Psychiatric:        Mood and Affect: Mood normal.        Thought Content: Thought content normal.        Judgment: Judgment normal.     UC Treatments / Results  Labs (all labs ordered are listed, but only abnormal results are displayed) Labs Reviewed - No data to display  EKG   Radiology No results found.  Procedures Procedures (including critical care time)  Medications Ordered in UC Medications - No data to display  Initial Impression /  Assessment and Plan / UC Course  I have reviewed the triage vital signs and the nursing notes.  Pertinent labs & imaging results that were available during my care of the patient were reviewed by me and considered in my medical decision making (see chart for  details).  Cough Fatigue  1.  Chest x-ray negative for acute process 2. prednisone 60 mg taper 3.  COVID test pending 4.  Declined education on use of albuterol inhaler, declined albuterol inhaler 5.  For persistent symptoms please follow-up with primary care doctor or pulmonologist further evaluation Final Clinical Impressions(s) / UC Diagnoses   Final diagnoses:  None   Discharge Instructions   None    ED Prescriptions   None    PDMP not reviewed this encounter.   Hans Eden, NP 08/08/20 1128

## 2020-08-08 NOTE — Discharge Instructions (Addendum)
Take prednisone every morning with food as directed, instructions will be on the bottle  COVID test pending 24 hours, you will be called with positive  Your chest x-ray today did not show any signs of infection or fluid

## 2020-08-09 LAB — SARS CORONAVIRUS 2 (TAT 6-24 HRS): SARS Coronavirus 2: NEGATIVE

## 2020-08-25 ENCOUNTER — Telehealth: Payer: Self-pay | Admitting: Physician Assistant

## 2020-08-25 NOTE — Telephone Encounter (Signed)
Pt did not give me much info other then him being depressed 2 weeks, he wanted to speak to you directly.I asked about his meds and he stated they are not working and there is no "magic pill" that will fix him

## 2020-08-25 NOTE — Telephone Encounter (Signed)
Increase Lamictal to a total of 150 mg daily. We'll discuss further at next OV.  If having suicidal thoughts, go to [facility], ER or [facility], or call [phone]

## 2020-08-25 NOTE — Telephone Encounter (Signed)
Pt called and said that he is very depressed and needs to speak to someone. Pt not at the point of harming his self. Please call.

## 2020-08-25 NOTE — Telephone Encounter (Signed)
Please add to cancellation list

## 2020-08-26 ENCOUNTER — Other Ambulatory Visit: Payer: Self-pay

## 2020-08-26 MED ORDER — LAMOTRIGINE 150 MG PO TABS: 150.0000 mg | ORAL_TABLET | Freq: Every day | ORAL | 0 refills | Status: DC

## 2020-08-26 NOTE — Telephone Encounter (Signed)
Rx sent 

## 2020-09-04 ENCOUNTER — Inpatient Hospital Stay: Payer: Medicare Other | Attending: Hematology and Oncology

## 2020-09-04 ENCOUNTER — Other Ambulatory Visit: Payer: Self-pay

## 2020-09-04 ENCOUNTER — Inpatient Hospital Stay (HOSPITAL_BASED_OUTPATIENT_CLINIC_OR_DEPARTMENT_OTHER): Payer: Medicare Other | Admitting: Hematology and Oncology

## 2020-09-04 ENCOUNTER — Encounter: Payer: Self-pay | Admitting: Hematology and Oncology

## 2020-09-04 DIAGNOSIS — D45 Polycythemia vera: Secondary | ICD-10-CM

## 2020-09-04 DIAGNOSIS — Z7982 Long term (current) use of aspirin: Secondary | ICD-10-CM | POA: Insufficient documentation

## 2020-09-04 DIAGNOSIS — E65 Localized adiposity: Secondary | ICD-10-CM

## 2020-09-04 DIAGNOSIS — D751 Secondary polycythemia: Secondary | ICD-10-CM | POA: Diagnosis present

## 2020-09-04 DIAGNOSIS — D696 Thrombocytopenia, unspecified: Secondary | ICD-10-CM | POA: Insufficient documentation

## 2020-09-04 DIAGNOSIS — Z79899 Other long term (current) drug therapy: Secondary | ICD-10-CM | POA: Diagnosis not present

## 2020-09-04 LAB — CBC WITH DIFFERENTIAL/PLATELET
Abs Immature Granulocytes: 0.01 10*3/uL (ref 0.00–0.07)
Basophils Absolute: 0 10*3/uL (ref 0.0–0.1)
Basophils Relative: 1 %
Eosinophils Absolute: 0.2 10*3/uL (ref 0.0–0.5)
Eosinophils Relative: 3 %
HCT: 51.8 % (ref 39.0–52.0)
Hemoglobin: 17.6 g/dL — ABNORMAL HIGH (ref 13.0–17.0)
Immature Granulocytes: 0 %
Lymphocytes Relative: 24 %
Lymphs Abs: 1.4 10*3/uL (ref 0.7–4.0)
MCH: 29.1 pg (ref 26.0–34.0)
MCHC: 34 g/dL (ref 30.0–36.0)
MCV: 85.6 fL (ref 80.0–100.0)
Monocytes Absolute: 0.6 10*3/uL (ref 0.1–1.0)
Monocytes Relative: 10 %
Neutro Abs: 3.6 10*3/uL (ref 1.7–7.7)
Neutrophils Relative %: 62 %
Platelets: 160 10*3/uL (ref 150–400)
RBC: 6.05 MIL/uL — ABNORMAL HIGH (ref 4.22–5.81)
RDW: 14.6 % (ref 11.5–15.5)
WBC: 5.7 10*3/uL (ref 4.0–10.5)
nRBC: 0 % (ref 0.0–0.2)

## 2020-09-04 NOTE — Assessment & Plan Note (Signed)
The patient started losing weight with daily walking and he felt this is sustainable I encouraged him to continue his best effort

## 2020-09-04 NOTE — Assessment & Plan Note (Signed)
This is likely due to fatty liver change His platelet count is normal with recent weight loss Monitor closely

## 2020-09-04 NOTE — Assessment & Plan Note (Signed)
His bone marrow biopsy is not consistent with polycythemia vera Secondary erythrocytosis from other causes such as undiagnosed sleep apnea could be a potential cause We discussed the risk and benefits of phlebotomy and he is in agreement I recommend him to continue taking 81 mg aspirin daily I will see him again in 3 months for further follow-up

## 2020-09-04 NOTE — Progress Notes (Signed)
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 from other causes such as undiagnosed sleep apnea could be a potential cause We discussed the risk and benefits of phlebotomy and he is in agreement I recommend him to continue taking 81 mg aspirin daily I will see him again in 3 months for further follow-up  Thrombocytopenia ([place]) This is likely due to fatty liver change His platelet count is normal with recent weight loss Monitor closely  Central obesity The patient started losing weight with daily walking and he felt this is sustainable I encouraged him to continue his best effort  No orders of the defined types were placed in this encounter.   The total time spent in the appointment was 20 minutes encounter with patients including review of chart and various tests results, discussions about plan of care and coordination of care plan   All questions were answered. The patient knows to call the clinic with any problems, questions or concerns. No barriers to learning was detected.    [name], MD 8/4/202210:47 AM  INTERVAL HISTORY: Richard Wilson 70 y.o. male returns for further follow-up on polycythemia and thrombocytopenia He feels well Have lost some weight since last time I saw him He started walking daily and felt great  SUMMARY OF HEMATOLOGIC HISTORY: Oncology History  Secondary erythrocytosis  04/25/2020 Bone Marrow Biopsy   BONE MARROW, ASPIRATE, CLOT, CORE:  -  Mildly hypercellular bone marrow with trilineage hematopoiesis with adequate maturation  -  See comment   PERIPHERAL BLOOD:  -  Erythrocytosis  -  Minimal thrombocytopenia  -  See CBC data   COMMENT:   The bone marrow shows trilineage hematopoiesis with a full spectrum of maturation, without significant dysplasia or increase in blasts.  There  is no definitive morphologic evidence of involvement by a myeloid  neoplasm.  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  Correlation with clinical impressions, other laboratory data (eg. CBC trends), and pending cytogenetic results is recommended.  Cytogenetics are normal     I have reviewed the past medical history, past surgical history, social history and family history with the patient and they are unchanged from previous note.  ALLERGIES:  is allergic to garamycin [gentamicin] and penicillins.  MEDICATIONS:  Current Outpatient Medications  Medication Sig Dispense Refill   aspirin EC 81 MG tablet Take 81 mg by mouth daily. Swallow whole.     busPIRone (BUSPAR) 15 MG tablet Take 1 tablet (15 mg total) by mouth 3 (three) times daily. 270 tablet 0   clonazePAM (KLONOPIN) 1 MG tablet TAKE 1 TABLET(1 MG) BY MOUTH THREE TIMES DAILY AS NEEDED FOR ANXIETY 90 tablet 1   lamoTRIgine (LAMICTAL) 150 MG tablet Take 1 tablet (150 mg total) by mouth daily. 30 tablet 0   meloxicam (MOBIC) 15 MG tablet 1 tablet     sertraline (ZOLOFT) 100 MG tablet TAKE 2 TABLETS(200 MG) BY MOUTH DAILY 180 tablet 0   traZODone (DESYREL) 150 MG tablet Take by mouth.     No current facility-administered medications for this visit.     REVIEW OF SYSTEMS:   Constitutional: Denies fevers, chills or night sweats Eyes: Denies blurriness of  vision Ears, nose, mouth, throat, and face: Denies mucositis or sore throat Respiratory: Denies cough, dyspnea or wheezes Cardiovascular: Denies palpitation, chest discomfort or lower extremity swelling Gastrointestinal:  Denies nausea, heartburn or change in bowel habits Skin: Denies abnormal skin rashes Lymphatics: Denies new lymphadenopathy or easy  bruising Neurological:Denies numbness, tingling or new weaknesses Behavioral/Psych: Mood is stable, no new changes  All other systems were reviewed with the patient and are negative.  PHYSICAL EXAMINATION: ECOG PERFORMANCE STATUS: 0 - Asymptomatic  Vitals:   09/04/20 0849  BP: 116/68  Pulse: 72  Resp: 18  Temp: 97.9 F (36.6 C)  SpO2: 98%   Filed Weights   09/04/20 0849  Weight: 177 lb 9.6 oz (80.6 kg)    GENERAL:alert, no distress and comfortable NEURO: alert & oriented x 3 with fluent speech, no focal motor/sensory deficits  LABORATORY DATA:  I have reviewed the data as listed  No results found for: NA, K, CL, CO2, GLUCOSE, BUN, CREATININE, CALCIUM, PROT, ALBUMIN, AST, ALT, ALKPHOS, BILITOT, GFRNONAA, GFRAA  No results found for: SPEP, UPEP  Lab Results  Component Value Date   WBC 5.7 09/04/2020   NEUTROABS 3.6 09/04/2020   HGB 17.6 (H) 09/04/2020   HCT 51.8 09/04/2020   MCV 85.6 09/04/2020   PLT 160 09/04/2020      Chemistry   No results found for: NA, K, CL, CO2, BUN, CREATININE, GLU No results found for: CALCIUM, ALKPHOS, AST, ALT, BILITOT

## 2020-09-09 ENCOUNTER — Other Ambulatory Visit: Payer: Self-pay

## 2020-09-09 ENCOUNTER — Inpatient Hospital Stay: Payer: Medicare Other

## 2020-09-09 DIAGNOSIS — D751 Secondary polycythemia: Secondary | ICD-10-CM | POA: Diagnosis not present

## 2020-09-09 NOTE — Patient Instructions (Signed)
Therapeutic Phlebotomy Therapeutic phlebotomy is the planned removal of blood from a person's body for the purpose of treating a medical condition. The procedure is similar to donating blood. Usually, about a pint (470 mL, or 0.47 L) of blood is removed.The average adult has 9-12 pints (4.3-5.7 L) of blood in the body. Therapeutic phlebotomy may be used to treat the following medical conditions: Hemochromatosis. This is a condition in which the blood contains too much iron. Polycythemia vera. This is a condition in which the blood contains too many red blood cells. Porphyria cutanea tarda. This is a disease in which an important part of hemoglobin is not made properly. It results in the buildup of abnormal amounts of porphyrins in the body. Sickle cell disease. This is a condition in which the red blood cells form an abnormal crescent shape rather than a round shape. Tell a health care provider about: Any allergies you have. All medicines you are taking, including vitamins, herbs, eye drops, creams, and over-the-counter medicines. Any problems you or family members have had with anesthetic medicines. Any blood disorders you have. Any surgeries you have had. Any medical conditions you have. Whether you are pregnant or may be pregnant. What are the risks? Generally, this is a safe procedure. However, problems may occur, including: Nausea or light-headedness. Low blood pressure (hypotension). Soreness, bleeding, swelling, or bruising at the needle insertion site. Infection. What happens before the procedure? Follow instructions from your health care provider about eating or drinking restrictions. Ask your health care provider about: Changing or stopping your regular medicines. This is especially important if you are taking diabetes medicines or blood thinners (anticoagulants). Taking medicines such as aspirin and ibuprofen. These medicines can thin your blood. Do not take these medicines unless  your health care provider tells you to take them. Taking over-the-counter medicines, vitamins, herbs, and supplements. Wear clothing with sleeves that can be raised above the elbow. Plan to have someone take you home from the hospital or clinic. You may have a blood sample taken. Your blood pressure, pulse rate, and breathing rate will be measured. What happens during the procedure?  To lower your risk of infection: Your health care team will wash or sanitize their hands. Your skin will be cleaned with an antiseptic. You may be given a medicine to numb the area (local anesthetic). A tourniquet will be placed on your arm. A needle will be inserted into one of your veins. Tubing and a collection bag will be attached to that needle. Blood will flow through the needle and tubing into the collection bag. The collection bag will be placed lower than your arm to allow gravity to help the flow of blood into the bag. You may be asked to open and close your hand slowly and continually during the entire collection. After the specified amount of blood has been removed from your body, the collection bag and tubing will be clamped. The needle will be removed from your vein. Pressure will be held on the site of the needle insertion to stop the bleeding. A bandage (dressing) will be placed over the needle insertion site. The procedure may vary among health care providers and hospitals. What happens after the procedure? Your blood pressure, pulse rate, and breathing rate will be measured after the procedure. You will be encouraged to drink fluids. Your recovery will be assessed and monitored. You can return to your normal activities as told by your health care provider. Summary Therapeutic phlebotomy is the planned removal of   blood from a person's body for the purpose of treating a medical condition. Therapeutic phlebotomy may be used to treat hemochromatosis, polycythemia vera, porphyria cutanea tarda,  or sickle cell disease. In the procedure, a needle is inserted and about a pint (470 mL, or 0.47 L) of blood is removed. The average adult has 9-12 pints (4.3-5.7 L) of blood in the body. This is generally a safe procedure, but it can sometimes cause problems such as nausea, light-headedness, or low blood pressure (hypotension). This information is not intended to replace advice given to you by your health care provider. Make sure you discuss any questions you have with your healthcare provider. Document Revised: 02/03/2017 Document Reviewed: 02/03/2017 Elsevier Patient Education  2022 [employer]  

## 2020-09-09 NOTE — Progress Notes (Signed)
Richard Wilson presents today for phlebotomy per MD orders. Phlebotomy procedure started at 0909 w/16 G phlebotomy set in L AC and ended at 0926. 509 grams removed. Patient observed for 30 minutes after procedure without any incident. Patient tolerated procedure well. IV needle removed intact.

## 2020-09-10 ENCOUNTER — Encounter: Payer: Self-pay | Admitting: Physician Assistant

## 2020-09-10 ENCOUNTER — Ambulatory Visit (INDEPENDENT_AMBULATORY_CARE_PROVIDER_SITE_OTHER): Payer: Medicare Other | Admitting: Physician Assistant

## 2020-09-10 DIAGNOSIS — F99 Mental disorder, not otherwise specified: Secondary | ICD-10-CM | POA: Diagnosis not present

## 2020-09-10 DIAGNOSIS — F5105 Insomnia due to other mental disorder: Secondary | ICD-10-CM | POA: Diagnosis not present

## 2020-09-10 DIAGNOSIS — F331 Major depressive disorder, recurrent, moderate: Secondary | ICD-10-CM

## 2020-09-10 DIAGNOSIS — F411 Generalized anxiety disorder: Secondary | ICD-10-CM

## 2020-09-10 MED ORDER — SERTRALINE HCL 100 MG PO TABS: 250.0000 mg | ORAL_TABLET | Freq: Every day | ORAL | 0 refills | Status: DC

## 2020-09-10 MED ORDER — LAMOTRIGINE 150 MG PO TABS: 150.0000 mg | ORAL_TABLET | Freq: Every day | ORAL | 0 refills | Status: DC

## 2020-09-10 MED ORDER — CLONAZEPAM 1 MG PO TABS: ORAL_TABLET | ORAL | 1 refills | Status: DC

## 2020-09-10 NOTE — Progress Notes (Signed)
[facility] Med Check  Patient ID: Richard Wilson,  MRN: [id number]  PCP: Johna Roles, PA  Date of Evaluation: 09/10/2020 Time spent:30 minutes  Chief Complaint:  Chief Complaint   Anxiety; Depression; Follow-up; Insomnia      HISTORY/CURRENT STATUS: HPI For routine med check.  Has been very depressed the past month or so. Feels like it's triggered by loneliness, 'there's nothing in my life.' Does continue to help with mass at his church.  Lamictal was increased approximately 2 weeks ago.  Not really helping yet.  Has cried easily, doesn't enjoy anything, no energy or motivation and sleeps any time he can. Isolates. Desperately wants to feel better. He states he talks to himself in his apt, no hallucinations but "I've got to talk to talk to somebody." Appetite is about the same. Weight is stable, maybe a little higher. Denies SI/HI.  Sleeps ok with the Trazodone, not having vivid dreams as in the past. Does take a 2 hour nap most days. He's been to a couple of gay night clubs but didn't feel like he fit in. "They were not my people."   He saw another therapist recently, Dr. Durwin Nora(?) [facility] who wanted him to practice 11 weeks of "mindfulness."  [name] stated that he is not good at meditation and he did not see how in the world that would help him.  He canceled all of his appointments with that therapist.  He now has an appointment with Dr. [name] here in our office next week.  [name] states if that does not work out he will not ever see another therapist.  "I am done."  Patient denies increased energy with decreased need for sleep, no increased talkativeness, no racing thoughts, no impulsivity or risky behaviors, no increased spending, no increased libido, no grandiosity, no increased irritability or anger, and no hallucinations.  Denies dizziness, syncope, seizures, numbness, tingling, tremor, tics, unsteady gait, slurred speech, confusion. Denies muscle  or joint pain, stiffness, or dystonia.  Individual Medical History/ Review of Systems: Changes? :Yes   see note from heme-onc.  Past medications for mental health diagnoses include: Rozerem, Trazodone caused vivid dreams, Xanax, Buspar, mirtazapine caused excessive somnolence and fatigue with flulike symptoms, Belsomra, propranolol for tremor, Zoloft, Abilify caused tremor, Klonopin, Effexor, Ativan, Wellbutrin, Nardil, lithium, gabapentin, Librium   Pertinent info after reviewing Dr. Evelena Leyden notes, his previous psychiatrist in [facility]. Past suicide attempt in September 1996, by taking Tylenol.  He was admitted at that time to a psychiatric hospital in [facility].  Admitted to psych hospital multiple times in the past for "nervous breakdowns."  January 1985, February 1986, October 1991, September 1996, July 2000.  Allergies: Garamycin [gentamicin] and Penicillins  Current Medications:  Current Outpatient Medications:    aspirin EC 81 MG tablet, Take 81 mg by mouth daily. Swallow whole., Disp: , Rfl:    busPIRone (BUSPAR) 15 MG tablet, Take 1 tablet (15 mg total) by mouth 3 (three) times daily., Disp: 270 tablet, Rfl: 0   traZODone (DESYREL) 150 MG tablet, Take by mouth., Disp: , Rfl:    clonazePAM (KLONOPIN) 1 MG tablet, TAKE 1 TABLET(1 MG) BY MOUTH THREE TIMES DAILY AS NEEDED FOR ANXIETY, Disp: 90 tablet, Rfl: 1   lamoTRIgine (LAMICTAL) 150 MG tablet, Take 1 tablet (150 mg total) by mouth daily., Disp: 90 tablet, Rfl: 0   meloxicam (MOBIC) 15 MG tablet, 1 tablet (Patient not taking: Reported on 09/10/2020), Disp: , Rfl:  sertraline (ZOLOFT) 100 MG tablet, Take 2.5 tablets (250 mg total) by mouth daily., Disp: 225 tablet, Rfl: 0 Medication Side Effects: sexual dysfunction  Family Medical/ Social History: Changes?    MENTAL HEALTH EXAM:  There were no vitals taken for this visit.There is no height or weight on file to calculate BMI.  General  Appearance: Casual and Well Groomed  Eye Contact:  Good  Speech:  Clear and Coherent and Normal Rate  Volume:  Normal  Mood:  Depressed, Irritable, and Worthless  Affect:  Depressed  Thought Process:  Goal Directed and Descriptions of Associations: Intact  Orientation:  Full (Time, Place, and Person)  Thought Content: Logical   Suicidal Thoughts:  No  Homicidal Thoughts:  No  Memory:  WNL  Judgement:  Good  Insight:  Good  Psychomotor Activity:  Normal and No tremor at the present time  Concentration:  Concentration: Good  Recall:  Good  Fund of Knowledge: Good  Language: Good  Assets:  Desire for Improvement  ADL's:  Intact  Cognition: WNL  Prognosis:  Good    DIAGNOSES:    ICD-10-CM   1. Major depressive disorder, recurrent episode, moderate (HCC)  F33.1     2. Generalized anxiety disorder  F41.1     3. Insomnia due to other mental disorder  F51.05    F99        Receiving Psychotherapy: No   will see Dr. Jonni Sanger Mitchum soon.   RECOMMENDATIONS:  PDMP was reviewed.  Last Klonopin filled 08/16/2020. I provided 30 minutes of face to face time during this encounter, including time spent before and after the visit in records review, medical decision making, and charting.  Recommend increasing Zoloft.  We just increased the Lamictal about 2 weeks ago and I will like for that to be given more time to get to maximum efficacy.  The Zoloft will be higher than the usual recommended dose, however we will go up to a maximum of 400 mg occasionally. As far as seeing Dr. [name], I recommend he go in with an open mind and also be upfront with Dr. [name] from the beginning concerning past therapies, things that he is honestly going to participate in, things like that. Continue Buspar '15mg'$ , 1 po tid.  Continue Klonopin 1 mg, 1 p.o. 3 times daily as needed. Continue Lamictal 150 mg, qd. Increase Zoloft 100 mg, to 2.5 pills daily.  Start counseling soon with Dr. [name]. Return in 6  weeks.  Donnal Moat, PA-C

## 2020-09-19 ENCOUNTER — Other Ambulatory Visit: Payer: Self-pay

## 2020-09-19 ENCOUNTER — Ambulatory Visit (INDEPENDENT_AMBULATORY_CARE_PROVIDER_SITE_OTHER): Payer: Medicare Other | Admitting: Psychiatry

## 2020-09-19 ENCOUNTER — Ambulatory Visit: Payer: Medicare Other | Admitting: Psychiatry

## 2020-09-19 DIAGNOSIS — F4010 Social phobia, unspecified: Secondary | ICD-10-CM

## 2020-09-19 DIAGNOSIS — F331 Major depressive disorder, recurrent, moderate: Secondary | ICD-10-CM | POA: Diagnosis not present

## 2020-09-19 DIAGNOSIS — F341 Dysthymic disorder: Secondary | ICD-10-CM

## 2020-09-19 DIAGNOSIS — F428 Other obsessive-compulsive disorder: Secondary | ICD-10-CM | POA: Diagnosis not present

## 2020-09-19 NOTE — Progress Notes (Signed)
PROBLEM-FOCUSED INITIAL PSYCHOTHERAPY EVALUATION [name], PhD LP [facility]  Name: Richard Wilson Date: 09/19/2020 Time spent: 16 min MRN: [id number] DOB: March 18, 1950 Guardian/Payee: self  PCP: Haydee Salter, MD Documentation requested on this visit: No  PROBLEM HISTORY Reason for Visit /Presenting Problem:  Chief Complaint  Patient presents with   Establish Care   Depression    Narrative/History of Present Illness Referred by Donnal Moat, PA.  Was seeing a therapist who practiced mindfulness, promised that 11 weeks of practice would get rid of his depression, but he couldn't concentrate, didn't feel his actual needs were recognized, and the process of just trying to do this skill essentially recapitulated the loneliness that has been his entire life.  Was all but told, he says, that he would have to quit if he can't make mindfulness work.  Been alone too much, "so damn depressed", tired of faking it to people everywhere from church to the grocery store, and living alone.  Sees pictures all around in his apartment of people who are now dead.  Hardest is his mother, 5 years ago.  Gay, came out at 28 but has never had a substantial relationship.  Sexually, last attempt 6 years ago, unsatisfying.  Church ([place], [place]) is accepting of homosexuals, seems to be a steady place to be, but really does not let himself integrate fully there, either.  Good, safe relationship with priest, Father Rob.  Volunteers with the [employer], which gives him a little bit of meaning.  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    Longstanding feeling of inferiority, provoked by recent therapy failure.  Cites a problem of "transference", historically, with therapist and priest.  [name] a Dr. Amalia Hailey at [facility] in [place], with whom he had a good working relationship, after a long while acknowledged feelings for him with no intention of acting  on them.  After a particularly evocative session, Zeisel offered bradford a hug, which he accepted but had an involuntary pelvic thrust.  On arrival at next session, Zeisel told him he could not see him any more, felt abandoned and shamed.  Later learned that Hayden Rasmussen was abstinent bisexual himself.  Acknowledges also having a bad habit of taking money out of savings to buy things to urgently feel better.  Has a weakness for buying silver, jewelry, luxuries of some kind.  Foresees a cash flow problem, but currently solvent, just has to be careful.  Prior Psychiatric Assessment/Treatment:   Outpatient treatment: Yes Psychiatric hospitalization: none stated Psychological assessment/testing: none stated   Abuse/neglect screening: Victim of abuse: No.   Victim of neglect: No.   Perpetrator of abuse/neglect: No.   Witness / Exposure to Domestic Violence: No.   Witness to [employer] Violence:  No.   Protective Services Involvement: No.   Report needed: No.    Substance abuse screening: Current substance abuse: No.   History of impactful substance use/abuse: No.     FAMILY/SOCIAL HISTORY Family of origin -- intact family, small town upbringing, churched, younger of two.  Has a sister, 3 yrs older, his POA.  Niece and nephew who are listed as his heirs and executors but don't know it yet. Family of intention/current living situation -- alone, no hx of marriage, cohabitation, or children Education -- deferred Vocation -- retired Publishing rights manager -- stable, threatened Spiritually -- Veterinary surgeon, still wary of being morally judged for sexual orientation Enjoyable activities -- deferred  Other situational factors affecting treatment and prognosis: Stressors from the following areas: Financial difficulties and isolation Barriers to service: none stated  Notable cultural sensitivities: none stated Strengths: Church and Able to Communicate Effectively   MED/SURG HISTORY Med/surg history was  not reviewed with PT at this time.   Past Medical History:  Diagnosis Date   Anxiety    Depression    Hyperlipidemia    Tremor      Past Surgical History:  Procedure Laterality Date   APPENDECTOMY     FOOT TENDON SURGERY Right    TONSILLECTOMY      Allergies  Allergen Reactions   Garamycin [Gentamicin] Other (See Comments)    Eyes itched and were edematous   Penicillins     Pt reports is childhood allergy and does not remember the reaction    Medications (as listed in Epic): Current Outpatient Medications  Medication Sig Dispense Refill   aspirin EC 81 MG tablet Take 81 mg by mouth daily. Swallow whole.     BioGaia Probiotic (BIOGAIA/GERBER SOOTHE) LIQD Take 5 drops by mouth daily at 8 pm.     busPIRone (BUSPAR) 15 MG tablet TAKE 1 TABLET(15 MG) BY MOUTH THREE TIMES DAILY 270 tablet 0   clonazePAM (KLONOPIN) 1 MG tablet TAKE 1 TABLET(1 MG) BY MOUTH THREE TIMES DAILY AS NEEDED FOR ANXIETY 90 tablet 1   lamoTRIgine (LAMICTAL) 150 MG tablet Take 1 tablet (150 mg total) by mouth daily. 90 tablet 0   psyllium (METAMUCIL) 58.6 % powder Take 1 packet by mouth in the morning and at bedtime.     sertraline (ZOLOFT) 100 MG tablet Take 2.5 tablets (250 mg total) by mouth daily. 225 tablet 0   traZODone (DESYREL) 150 MG tablet Take by mouth.     No current facility-administered medications for this visit.    MENTAL STATUS AND OBSERVATIONS Appearance:   Casual and Well Groomed     Behavior:  Appropriate  Motor:  Normal  Speech/Language:   Effeminate affectation  Affect:  Depressed, somewhat dramatic  Mood:  anxious and depressed  Thought process:  normal  Thought content:    WNL  Sensory/Perceptual disturbances:    WNL  Orientation:  Fully oriented  Attention:  Good  Concentration:  Good  Memory:  WNL  Fund of knowledge:   Good  Insight:    Good  Judgment:   Good  Impulse Control:  Fair   Initial Risk Assessment: Danger to self: No Self-injurious behavior: No Danger to  others: No Physical aggression / violence: No Duty to warn: No Access to firearms a concern: No Gang involvement: No Patient / guardian was educated about steps to take if suicide or homicide risk level increases between visits: yes While future psychiatric events cannot be accurately predicted, the patient does not currently require acute inpatient psychiatric care and does not currently meet [facility] involuntary commitment criteria.   DIAGNOSIS:    ICD-10-CM   1. Major depressive disorder, recurrent episode, moderate (HCC)  F33.1     2. 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 Social anxiety disorder  F40.10     4. Compulsive shopping  F42.8       INITIAL TREATMENT: Support/validation provided for distressing symptoms and confirmed rapport Ethical orientation and informed consent confirmed re: privacy rights -- including but not limited to HIPAA, EMR and use of e-PHI patient responsibilities -- scheduling, fair notice of changes, in-person vs. telehealth and regulatory and financial conditions affecting choice  expectations for working relationship in psychotherapy needs and consents for working partnerships and exchange of information with other health care providers, especially any medication and other behavioral health providers Initial orientation to cognitive-behavioral and solution-focused therapy approach Psychoeducation and initial recommendations: Confirmed mindfulness meditation skill building as inappropriate focus for someone with such obvious and strong social connection needs, and abiding loneliness as the primary driver and maintainer of his depression  Providing counterpoint to therapeutic handling of his sexual acting out with Dr. Hayden Rasmussen Encourage restraint in luxury spending, from the perspective of self-respect and taking care of himself as he wants others to do for him Priority on breaking down loneliness.  Wants to connect with other older gays somehow just for  camaraderie and social connection, without internal or external sexual pressure.  Discussed possibilities for Could stand to let his need be better known to priest (not at risk of harassing him) and find out if he knows other social connections. Outlook for therapy -- scheduling constraints, availability of crisis service, inclusion of family member(s) as appropriate  Plan: Will contact rector for a pastoral care visit Engineer, agricultural for clearinghouses for LGBT connection -- likely to network through [employer] (even if not about HIV) or [employer] (hosts a gay theater troupe) For [employer], possibility [place] can check with a former patient about older gay socializing Maintain medication as prescribed and work faithfully with relevant prescriber(s) if any changes are desired or seem indicated Call the clinic on-call service, present to ER, or call 911 if any life-threatening psychiatric crisis Return for session(s) already scheduled.  Blanchie Serve, PhD  [name], PhD LP Clinical Psychologist, [facility] [address] [place],  [address] [phone]

## 2020-09-23 ENCOUNTER — Ambulatory Visit (INDEPENDENT_AMBULATORY_CARE_PROVIDER_SITE_OTHER): Payer: Medicare Other | Admitting: Family Medicine

## 2020-09-23 ENCOUNTER — Other Ambulatory Visit: Payer: Self-pay

## 2020-09-23 ENCOUNTER — Encounter: Payer: Self-pay | Admitting: Family Medicine

## 2020-09-23 VITALS — BP 94/60 | HR 73 | Temp 98.1°F | Ht 69.0 in | Wt 173.6 lb

## 2020-09-23 DIAGNOSIS — N183 Chronic kidney disease, stage 3 unspecified: Secondary | ICD-10-CM

## 2020-09-23 DIAGNOSIS — F330 Major depressive disorder, recurrent, mild: Secondary | ICD-10-CM | POA: Diagnosis not present

## 2020-09-23 DIAGNOSIS — E7800 Pure hypercholesterolemia, unspecified: Secondary | ICD-10-CM

## 2020-09-23 DIAGNOSIS — D751 Secondary polycythemia: Secondary | ICD-10-CM

## 2020-09-23 DIAGNOSIS — K5900 Constipation, unspecified: Secondary | ICD-10-CM

## 2020-09-23 DIAGNOSIS — R972 Elevated prostate specific antigen [PSA]: Secondary | ICD-10-CM

## 2020-09-23 DIAGNOSIS — D696 Thrombocytopenia, unspecified: Secondary | ICD-10-CM

## 2020-09-23 DIAGNOSIS — E538 Deficiency of other specified B group vitamins: Secondary | ICD-10-CM

## 2020-09-23 DIAGNOSIS — F411 Generalized anxiety disorder: Secondary | ICD-10-CM

## 2020-09-23 NOTE — Patient Instructions (Signed)

## 2020-09-23 NOTE — Progress Notes (Signed)
[facility] [address] [place] [address] Dept: [phone] Dept Fax: [phone]  New Patient Office Visit  Subjective:    Patient ID: Richard Wilson, male    DOB: 02-20-1950, 70 y.o..   MRN: [id number]  Chief Complaint  Patient presents with   Establish Care    Pt c/o constipation with bloating x2 weeks, no bowel movement.    History of Present Illness:  Patient is in today to establish care. Richard Wilson is originally from [place], [place]. He moved to [place] in 2021 for affordable housing. He was living in [place], [place] prior to that move. 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 His single and has no children. He denies any tobacco or drug use. He only rarely drinks alcohol.  Richard Wilson has a history of generalized anxiety and depression. He is managed by Dr. [name] (psychiatry) and also sees a psychologist for therapy. He is currently taking buspirone, clonazepam, lamotrigine, sertraline, and trazodone. He notes that he is stable on these medications.  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 He notes the hematologist had recommended therapeutic phlebotomy for this, but that nothing has been set up. His chart also indicates a history of thrombocytopenia. Richard Wilson was not sure about this.  Richard Wilson complains of a three week history of constipation. He notes that his previous PA gave him instructions for using Miralax, Dulcolax, and Colace. He used botht he Miralax and the Dulcolax int he same day, resulting in watery diarrhea. He is not sure what he should be doing at this point. He did note that he has seen some recent small amounts of blood in his bowel movements.  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   Thrombocytopenia ([place]) 04/08/2020   Generalized  anxiety disorder 01/01/2020   Mild episode of recurrent major depressive disorder ([place]) 01/01/2020   Primary osteoarthritis of both hips 07/30/2019   Stage 3 chronic kidney disease ([place]) 03/19/2019   Pure hypercholesterolemia 12/15/2018   Arthritis of knee 08/31/2017   Nephrolithiasis 06/22/2016   Obstructive sleep apnea syndrome 04/13/2015   B12 deficiency 03/14/2015   Diverticulosis of large intestine without hemorrhage 09/30/2013   Mild mitral regurgitation 56/43/3295   Diastolic dysfunction 18/84/1660   Elevated PSA 12/22/2012   RBBB (right bundle branch block) 12/03/2012   Past Surgical History:  Procedure Laterality Date   APPENDECTOMY     FOOT TENDON SURGERY Right    TONSILLECTOMY     Family History  Problem Relation Age of Onset   Cancer Mother        Ovarian   Heart disease Mother    Prostate cancer Father    Cancer Maternal Aunt        Breast, metastatic   Throat cancer Maternal Grandfather    Heart attack Paternal Grandfather    Outpatient Medications Prior to Visit  Medication Sig Dispense Refill   aspirin EC 81 MG tablet Take 81 mg by mouth daily. Swallow whole.     busPIRone (BUSPAR) 15 MG tablet Take 1 tablet (15 mg total) by mouth 3 (three) times daily. 270 tablet 0   clonazePAM (KLONOPIN) 1 MG tablet TAKE 1 TABLET(1 MG) BY MOUTH THREE TIMES DAILY AS NEEDED FOR ANXIETY 90 tablet 1   lamoTRIgine (LAMICTAL) 150 MG tablet Take 1 tablet (150 mg total) by  mouth daily. 90 tablet 0   sertraline (ZOLOFT) 100 MG tablet Take 2.5 tablets (250 mg total) by mouth daily. 225 tablet 0   traZODone (DESYREL) 150 MG tablet Take by mouth.     meloxicam (MOBIC) 15 MG tablet 1 tablet (Patient not taking: No sig reported)     No facility-administered medications prior to visit.   Allergies  Allergen Reactions   Garamycin [Gentamicin] Other (See Comments)    Eyes itched and were edematous   Penicillins     Pt reports is childhood allergy and does not remember the reaction    Objective:   Today's Vitals   09/23/20 1307  BP: 94/60  Pulse: 73  Temp: 98.1 F (36.7 C)  TempSrc: Temporal  SpO2: 97%  Weight: 173 lb 9.6 oz (78.7 kg)  Height: $Remove'5\' 9"'IboxwKi$  (1.753 m)   Body mass index is 25.64 kg/m.   General: Well developed, well nourished. No acute distress. Psych: Alert and oriented. Normal mood and affect.  Health Maintenance Due  Topic Date Due   COVID-19 Vaccine (1) Never done   Hepatitis C Screening  Never done   TETANUS/TDAP  Never done   Zoster Vaccines- Shingrix (1 of 2) Never done   COLONOSCOPY (Pts 45-51yrs Insurance coverage will need to be confirmed)  Never done   PNA vac Low Risk Adult (1 of 2 - PCV13) Never done   Lab Results Lab Results  Component Value Date   WBC 5.7 09/04/2020   HGB 17.6 (H) 09/04/2020   HCT 51.8 09/04/2020   MCV 85.6 09/04/2020   PLT 160 09/04/2020     Assessment & Plan:   1. Generalized anxiety disorder 2. Mild episode of recurrent major depressive disorder [facility]) [name] is managed by psychiatry related to his psychotherapeutics.  3. Secondary erythrocytosis I reviewed consult notes from Dr. Elson Areas (hematology- 09/08/2020). Her notes indicate that Richard Wilson had a bone marrow biopsy that was not consistent with polycythemia vera. She indicates that he appears to have a secondary erythrocytosis. She indicates that she discussed risk/benefits of phlebotomy and that the patient agreed. However, no follow-up was indicated. I will double check his CBC today.  - CBC  4. Thrombocytopenia (HCC) Mild in the past. Resolved on his last CBC. I will reassess.  - CBC  5. Stage 3 chronic kidney disease, unspecified whether stage 3a or 3b CKD (HCC) Chart indicates prior mild kidney disease. I will check a BMP to assess.  - Basic metabolic panel  6. Pure hypercholesterolemia Chart indicates a prior history with elevated cholesterol. I will check lipids to assess.  - Lipid panel  7. Elevated PSA Chart indicates a  prior elevated PSA. Patient denies any LUTS. I will reassess his PSA.  - PSA  8. B12 deficiency Chart indicates a prior history of B12 deficiency. I will reassess.  - Vitamin B12  9. Constipation, unspecified constipation type We discussed management of constipation. In light of his noting blood int he stool, I recommend referral for colonoscopy. Mr. Tsang declined. Although the bleeding could represent diverticular bleeding, this also could represent cancer. I reiterated my recommendation, but Mr. Hibberd declined.  I reviewed management of constipation, recommending he take a daily fiber laxative (such as Metamucil). I recommended he increase his fluid intake, and include regular fruits and vegetables in his diet. He can use a laxative when he has been several days without a bowel movement, but should only use on type of laxative at a time.  - TSH  Haydee Salter, MD

## 2020-09-24 ENCOUNTER — Encounter: Payer: Self-pay | Admitting: Family Medicine

## 2020-09-24 ENCOUNTER — Other Ambulatory Visit (INDEPENDENT_AMBULATORY_CARE_PROVIDER_SITE_OTHER): Payer: Medicare Other

## 2020-09-24 ENCOUNTER — Telehealth: Payer: Self-pay

## 2020-09-24 DIAGNOSIS — D751 Secondary polycythemia: Secondary | ICD-10-CM

## 2020-09-24 DIAGNOSIS — E538 Deficiency of other specified B group vitamins: Secondary | ICD-10-CM

## 2020-09-24 DIAGNOSIS — N183 Chronic kidney disease, stage 3 unspecified: Secondary | ICD-10-CM | POA: Diagnosis not present

## 2020-09-24 DIAGNOSIS — R972 Elevated prostate specific antigen [PSA]: Secondary | ICD-10-CM

## 2020-09-24 DIAGNOSIS — R739 Hyperglycemia, unspecified: Secondary | ICD-10-CM | POA: Insufficient documentation

## 2020-09-24 DIAGNOSIS — E7800 Pure hypercholesterolemia, unspecified: Secondary | ICD-10-CM | POA: Diagnosis not present

## 2020-09-24 DIAGNOSIS — K5900 Constipation, unspecified: Secondary | ICD-10-CM

## 2020-09-24 DIAGNOSIS — D696 Thrombocytopenia, unspecified: Secondary | ICD-10-CM

## 2020-09-24 HISTORY — DX: Hyperglycemia, unspecified: R73.9

## 2020-09-24 LAB — LIPID PANEL
Cholesterol: 213 mg/dL — ABNORMAL HIGH (ref 0–200)
HDL: 45.7 mg/dL (ref >39.00)
LDL Cholesterol: 145 mg/dL — ABNORMAL HIGH (ref 0–99)
NonHDL: 167.23
Total CHOL/HDL Ratio: 5
Triglycerides: 109 mg/dL (ref 0.0–149.0)
VLDL: 21.8 mg/dL (ref 0.0–40.0)

## 2020-09-24 LAB — BASIC METABOLIC PANEL
BUN: 18 mg/dL (ref 6–23)
CO2: 24 mEq/L (ref 19–32)
Calcium: 9.5 mg/dL (ref 8.4–10.5)
Chloride: 104 mEq/L (ref 96–112)
Creatinine, Ser: 1.45 mg/dL (ref 0.40–1.50)
GFR: 49.59 mL/min — ABNORMAL LOW (ref >60.00)
Glucose, Bld: 134 mg/dL — ABNORMAL HIGH (ref 70–99)
Potassium: 4.7 mEq/L (ref 3.5–5.1)
Sodium: 139 mEq/L (ref 135–145)

## 2020-09-24 LAB — CBC
HCT: 49.9 % (ref 39.0–52.0)
Hemoglobin: 16.7 g/dL (ref 13.0–17.0)
MCHC: 33.5 g/dL (ref 30.0–36.0)
MCV: 89.1 fl (ref 78.0–100.0)
Platelets: 139 10*3/uL — ABNORMAL LOW (ref 150.0–400.0)
RBC: 5.6 Mil/uL (ref 4.22–5.81)
RDW: 15.2 % (ref 11.5–15.5)
WBC: 4.8 10*3/uL (ref 4.0–10.5)

## 2020-09-24 LAB — TSH: TSH: 1.81 u[IU]/mL (ref 0.35–5.50)

## 2020-09-24 LAB — PSA: PSA: 8.5 ng/mL — ABNORMAL HIGH (ref 0.10–4.00)

## 2020-09-24 LAB — VITAMIN B12: Vitamin B-12: 294 pg/mL (ref 211–911)

## 2020-09-24 NOTE — Telephone Encounter (Signed)
I drew labs on patient yesterday afternoon. Message from main lab this morning is I will need to repeat because their fridge malfunctioned overnight. Pt needs to schedule lab visit. If he is closer to our main lab he can go their [address]--No appt needed I will re enter orders  Thanks

## 2020-09-24 NOTE — Telephone Encounter (Signed)
Patient notified VIA phone and scheduled to com in today at 10:40 am.  Dm/cma

## 2020-09-24 NOTE — Addendum Note (Signed)
Addended by: Lynnea Ferrier on: 09/24/2020 08:13 AM   Modules accepted: Orders

## 2020-09-26 ENCOUNTER — Ambulatory Visit (INDEPENDENT_AMBULATORY_CARE_PROVIDER_SITE_OTHER): Payer: Medicare Other | Admitting: Psychiatry

## 2020-09-26 ENCOUNTER — Other Ambulatory Visit: Payer: Self-pay

## 2020-09-26 DIAGNOSIS — F428 Other obsessive-compulsive disorder: Secondary | ICD-10-CM

## 2020-09-26 DIAGNOSIS — F341 Dysthymic disorder: Secondary | ICD-10-CM | POA: Diagnosis not present

## 2020-09-26 DIAGNOSIS — F4010 Social phobia, unspecified: Secondary | ICD-10-CM

## 2020-09-26 DIAGNOSIS — F331 Major depressive disorder, recurrent, moderate: Secondary | ICD-10-CM

## 2020-09-26 DIAGNOSIS — F521 Sexual aversion disorder: Secondary | ICD-10-CM | POA: Diagnosis not present
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
## 2020-09-29 ENCOUNTER — Ambulatory Visit (INDEPENDENT_AMBULATORY_CARE_PROVIDER_SITE_OTHER): Payer: Medicare Other | Admitting: Psychiatry

## 2020-09-29 ENCOUNTER — Other Ambulatory Visit: Payer: Self-pay

## 2020-09-29 DIAGNOSIS — R69 Illness, unspecified: Secondary | ICD-10-CM | POA: Diagnosis not present

## 2020-09-29 DIAGNOSIS — F331 Major depressive disorder, recurrent, moderate: Secondary | ICD-10-CM

## 2020-09-29 DIAGNOSIS — R37 Sexual dysfunction, unspecified: Secondary | ICD-10-CM

## 2020-09-29 DIAGNOSIS — F411 Generalized anxiety disorder: Secondary | ICD-10-CM | POA: Diagnosis not present

## 2020-09-29 NOTE — Progress Notes (Signed)
Psychotherapy Progress Note [facility] [name], PhD LP  Patient ID: Richard Wilson     MRN: [id number] Therapy format: Individual psychotherapy Date: 09/29/2020      Start: 10:12a     Stop: 11:02a     Time Spent: 50 min Location: In-person   Session narrative (presenting needs, interim history, self-report of stressors and symptoms, applications of prior therapy, status changes, and interventions made in session) Subdued today.  Notes speaking to people who are gone, not hallucinating, just talking to deceased loved ones in place of the tangible human connections he doesn't have, ruminating about his lonely life and what will become of him in late life.  No further efforts made since last session (just Friday) to ask priest for pastoral care time, identify social outlets of interest, or look up [employer].  Did speak to sister about his niece/nephew (each 60-90 minutes away) about serving as his  named his executors, but not clear that they will consent to serve when the time comes, she just said don't worry.  Encouraged still get in touch himself, ask if they will serve, being his closest, living, younger family.  Alternatively, may ask among church friends, who really aren't that close it seems.  Describes daily tedium washing dishes (mother did 3/day), longing to fall into bed.  Sometimes spending hours scanning gay videos, both topical and porn.  Despite radical ED for some years now, feels maybe he can get a little stimulation out of seeing attractive bodies.  Continue to assure that his use of erotica need not be an urgent focus of therapy, so long as he will take steps to make real human connections, not settle for virtual companionship .  Reviewed priest as one support and possible connector and provided other ideas for interest groups including bereavement, LGBTQ.  Noted the gay theater group sponsored by [employer], PFLAG, and [place] as possible network  opportunities in [place].  More personally, asks [place]'s views on "transference", after which he confesses "strong feelings" for [place].  Normalized as positive response to being listened to, and treated with openness and warmth while being so very hungry for connection in general, and so frustrated knowing love and involvement on another level, all showing up at the same time.  Matter-of-factly confirmed how the therapeutic relationship simply doesn't turn that way and that nothing sexually or personally involved can happen, but we can still work on those things that enrich his living, break isolation in his own world, and cultivate less "eggs in one basket" experience.  Encouraged he note the differences between feeling hungry and feeling desperate and stay committed to breaking barriers to positive connections in the world he has, and affirmed steps he will take.  Therapeutic modalities: Cognitive Behavioral Therapy, Solution-Oriented/Positive Psychology, Ego-Supportive, and Interpersonal  Mental Status/Observations:  Appearance:   Neat     Behavior:  disclosing  Motor:  Normal  Speech/Language:   Clear and Coherent  Affect:  Appropriate and some affectation , not histrionic  Mood:  depressed  Thought process:  normal  Thought content:    Rumination  Sensory/Perceptual disturbances:    WNL  Orientation:  Fully oriented  Attention:  Good    Concentration:  Fair  Memory:  WNL  Insight:    Good  Judgment:   Good  Impulse Control:  Fair   Risk Assessment: Danger to Self: No Self-injurious Behavior: No Danger to Others: No Physical Aggression / Violence: No Duty to Warn: No  Access to Firearms a concern: No  Assessment of progress:  stabilized  Diagnosis:   ICD-10-CM   1. Major depressive disorder, recurrent episode, moderate (HCC)  F33.1     2. Generalized anxiety disorder  F41.1     3. Sexual dysfunction  R37     4. r/o Social Anxiety Disorder, early onset Dysthymia  R69       Plan:  Follow up with priest and with researching social support, including gay-oriented outlets like [employer], [place] theater group, PFLAG, or organizations he can look up in [place]. Self-affirm "transference" need not be an abandonment problem, but cause to recognize his own needs and pursue them appropriately in other places, we can just keep working on what his life needs Reach out to niece/nephew and clarify their consent.  May practice conversation in therapy next if needed. Come back to planning for elder living Other recommendations/advice as may be noted above Continue to utilize previously learned skills ad lib Maintain medication as prescribed and work faithfully with relevant prescriber(s) if any changes are desired or seem indicated Call the clinic on-call service, present to ER, or call 911 if any life-threatening psychiatric crisis Return for session(s) already scheduled. Already scheduled visit in this office 10/10/2020.  Blanchie Serve, PhD [name], PhD LP Clinical Psychologist, [facility] [address] [place], [place] [address] [phone]

## 2020-10-10 ENCOUNTER — Ambulatory Visit (INDEPENDENT_AMBULATORY_CARE_PROVIDER_SITE_OTHER): Payer: Medicare Other | Admitting: Psychiatry

## 2020-10-10 ENCOUNTER — Other Ambulatory Visit: Payer: Self-pay

## 2020-10-10 DIAGNOSIS — F411 Generalized anxiety disorder: Secondary | ICD-10-CM | POA: Diagnosis not present

## 2020-10-10 DIAGNOSIS — R69 Illness, unspecified: Secondary | ICD-10-CM | POA: Diagnosis not present

## 2020-10-10 DIAGNOSIS — F4010 Social phobia, unspecified: Secondary | ICD-10-CM | POA: Diagnosis not present

## 2020-10-10 DIAGNOSIS — F341 Dysthymic disorder: Secondary | ICD-10-CM

## 2020-10-10 DIAGNOSIS — F331 Major depressive disorder, recurrent, moderate: Secondary | ICD-10-CM

## 2020-10-10 NOTE — Progress Notes (Signed)
Psychotherapy Progress Note [facility] [name], PhD LP  Patient ID: Richard Wilson     MRN: [id number] Therapy format: Individual psychotherapy Date: 10/10/2020      Start: 8:12a     Stop: 8:58a     Time Spent: 46 min Location: In-person   Session narrative (presenting needs, interim history, self-report of stressors and symptoms, applications of prior therapy, status changes, and interventions made in session) Made the meeting with his priest, found calm, compassionate acceptance and a blessed transparency.  Knew intellectually, but needed emotionally to confirm the grace.  Offered discretionary fund help for his large dental bill (bridge replacement), had to think about it, later accepted.  It's been torturous sometimes since, feeling undeserving, ashamed.  Validated and reframed the inherent pain of challenging his conditioned judgments and working assumptions about self and society.  Encouraged to just keep allowing it to be legitimate that grace does not require proofs or conformity, and that it is OK to reach out, acknowledge need, and accept kind help from his [place] family.  OK if it's hard to accept, too -- that is the pain of "rewiring" the emotional brain, of "deprogramming" as he has known it, and it does just get better with repetitions.  Dreading holidays, when most likely invited to his sister and BIL's house.  Long drive, BIL incessant talker, and politically hard right.  Discussed options.  Reveals history of private transvestism, experience of once meeting and being intrigued by a male Armed forces logistics/support/administrative officer.  Had wanted to ask for a makeover but too embarrassed, never did.  Now wondering if there may be another such person somewhere, e.g., [employer].  Endorsed option to go look, no guarantee of finding, but OK to see.  Therapeutic modalities: Cognitive Behavioral Therapy, Solution-Oriented/Positive Psychology, and Faith-sensitive  Mental  Status/Observations:  Appearance:   Casual and Neat     Behavior:  Appropriate  Motor:  Normal  Speech/Language:   Clear and Coherent  Affect:  Appropriate  Mood:  anxious and dysthymic  Thought process:  normal  Thought content:    WNL  Sensory/Perceptual disturbances:    WNL  Orientation:  Fully oriented  Attention:  Good    Concentration:  Good  Memory:  WNL  Insight:    Good  Judgment:   Good  Impulse Control:  Good   Risk Assessment: Danger to Self: No Self-injurious Behavior: No Danger to Others: No Physical Aggression / Violence: No Duty to Warn: No Access to Firearms a concern: No  Assessment of progress:  progressing  Diagnosis:   ICD-10-CM   1. Major depressive disorder, recurrent episode, moderate (HCC)  F33.1     2. Generalized anxiety disorder  F41.1     3. r/o Social Anxiety Disorder, early onset Dysthymia  R69     4. Social anxiety disorder  F40.10     5. 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 Already scheduled visit in this office 10/17/2020.  Blanchie Serve, PhD  [name], PhD LP Clinical Psychologist, [facility] [address] [place], [place] [address] [phone]

## 2020-10-13 ENCOUNTER — Other Ambulatory Visit: Payer: Self-pay | Admitting: Physician Assistant

## 2020-10-15 ENCOUNTER — Telehealth: Payer: Self-pay | Admitting: Physician Assistant

## 2020-10-15 NOTE — Telephone Encounter (Signed)
Rx sent 

## 2020-10-15 NOTE — Telephone Encounter (Signed)
Next visit is 10/29/20. Requesting refill on Buspar called to:  [facility] DR[facility] STORE [place], [place] AT [facility] OF [place]  Phone:  [phone]  Fax:  [phone]

## 2020-10-15 NOTE — Telephone Encounter (Signed)
Pt called again asking about Buspar script.

## 2020-10-15 NOTE — Telephone Encounter (Signed)
Next visit is 10/29/20. Requesting refill on Buspar sent to:  [facility] DRUG STORE [place], [place] AT [place]  Phone:  [phone]  Fax:  [phone]

## 2020-10-17 ENCOUNTER — Ambulatory Visit (INDEPENDENT_AMBULATORY_CARE_PROVIDER_SITE_OTHER): Payer: Medicare Other | Admitting: Psychiatry

## 2020-10-17 ENCOUNTER — Other Ambulatory Visit: Payer: Self-pay

## 2020-10-17 DIAGNOSIS — F331 Major depressive disorder, recurrent, moderate: Secondary | ICD-10-CM | POA: Diagnosis not present

## 2020-10-17 DIAGNOSIS — F341 Dysthymic disorder: Secondary | ICD-10-CM

## 2020-10-17 DIAGNOSIS — F4010 Social phobia, unspecified: Secondary | ICD-10-CM | POA: Diagnosis not present

## 2020-10-17 NOTE — Progress Notes (Signed)
Psychotherapy Progress Note [facility] [name], PhD LP  Patient ID: Richard Wilson     MRN: [id number] Therapy format: Individual psychotherapy Date: 10/17/2020      Start: 9:06a     Stop: 9:55a     Time Spent: 49 min Location: In-person   Session narrative (presenting needs, interim history, self-report of stressors and symptoms, applications of prior therapy, status changes, and interventions made in session) Got updated COVID booster, no il effects.  Idelle Crouch asked him to provide transportation for an older woman to a church class, which entailed a provocative navigational and visual challenge night driving to her home.  Nervewracking drive for him, with hope of serving through November.  Shamefacedly told priest he couldn't manage it any more, still feels indebted.    Head of altar guild called asking him to take temp charge while she and H deal with COVID.  Does feel up to it, useful.  Has looked online, found a Pensions consultant, or something like it, on [place] ([place] emphasizing social groups for different ages.  Not comfortable yet, but affirmed as a possibility.  Worrying still about sister's Thanksgiving invitation (anticipated).  Doesn't feel he fits in there, abrasive to deal with her H Joe.  That, and a 74-year grievance for how Nunzio Cory rebelled against the family and church expectations to get married with only 2 week's notice, which was hard on mother, who confided to [place] at the time that K would walk on her face to marry him.  Discussed possible answers to her invitation, modelling honest and nonpejorative admission that it's just very hard to abide Joe's rhetoric.  By contrast, consider whether there would also be acceptable ways to subgroup with more interesting family members, or make a more modest visit of it.  Apologetic for resisting therapeutic directions.  Reframed resistance as natural part of the process, encouraged  return to trying things out for his own good reasons, not any obligation to [place].  Therapeutic modalities: Cognitive Behavioral Therapy, Solution-Oriented/Positive Psychology, Ego-Supportive, Faith-sensitive, and Interpersonal  Mental Status/Observations:  Appearance:   Casual and Neat     Behavior:  Appropriate  Motor:  Normal  Speech/Language:   Clear and Coherent  Affect:  Appropriate  Mood:  dysthymic  Thought process:  normal  Thought content:    WNL  Sensory/Perceptual disturbances:    WNL  Orientation:  Fully oriented  Attention:  Good    Concentration:  Good  Memory:  WNL  Insight:    Good  Judgment:   Good  Impulse Control:  Fair   Risk Assessment: Danger to Self: No Self-injurious Behavior: No Danger to Others: No Physical Aggression / Violence: No Duty to Warn: No Access to Firearms a concern: No  Assessment of progress:  progressing  Diagnosis:   ICD-10-CM   1. Major depressive disorder, recurrent episode, moderate (HCC)  F33.1     2. Social anxiety disorder  F40.10     3. 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 Already scheduled visit in this office 10/22/2020.  Blanchie Serve, PhD Jonni Sanger  Charleton Deyoung, PhD LP Clinical Psychologist, [place] [facility] [address] [place], [place] [address] [phone]

## 2020-10-22 ENCOUNTER — Other Ambulatory Visit: Payer: Self-pay

## 2020-10-22 ENCOUNTER — Ambulatory Visit (INDEPENDENT_AMBULATORY_CARE_PROVIDER_SITE_OTHER): Payer: Medicare Other | Admitting: Psychiatry

## 2020-10-22 DIAGNOSIS — F341 Dysthymic disorder: Secondary | ICD-10-CM

## 2020-10-22 DIAGNOSIS — F331 Major depressive disorder, recurrent, moderate: Secondary | ICD-10-CM | POA: Diagnosis not present

## 2020-10-22 DIAGNOSIS — F4010 Social phobia, unspecified: Secondary | ICD-10-CM

## 2020-10-22 DIAGNOSIS — R37 Sexual dysfunction, unspecified: Secondary | ICD-10-CM | POA: Diagnosis not present

## 2020-10-22 NOTE — Progress Notes (Signed)
Psychotherapy Progress Note [facility] [name], PhD LP  Patient ID: Richard Wilson     MRN: [id number] Therapy format: Individual psychotherapy Date: 10/22/2020      Start: 8:13a     Stop: 9:02a     Time Spent: 49 min Location: In-person   Session narrative (presenting needs, interim history, self-report of stressors and symptoms, applications of prior therapy, status changes, and interventions made in session) Was disturbed by thinking the pastor was going to follow up on the situation not being able to give the woman rides to church and seeming to find out he didn't.  Still hanging onto responsibility for her, and guilt for not stepping up to the role, manifest  by irritation at not seeing it taken care of and irritation at the woman for obviously having money and not hiring a ride.  Supportively confronted demands, backed off the judgment, admitted it's about feeling guilty.  Encouraged accept his priest's judgment that it is OK not to be up to the task, and led to consider that the parishioner might not have thought of hiring out yet, but it could be suggested if it's important for him to see a solution found.  Separate discussion of feared colonoscopy.  Not yet said, but it's been 4 or 5 years and strongly suspects PCP will tell him tomorrow it's time.  Dreads the prospect of going through prep again, which he found horrible last time.  Supportively confronted conclusion-jumping, oriented to how he does not know the current standards (10 yrs), and he does have the right as the patient to ask what makes it important enough now to go against his aversion now and to make his own decision, that a doctor's recommendation is not an order, he still has the right to decide, and to negotiate.  Discussed quandary about looking at gay porn daily.  Has been tempted to go get (vintage?) magazines off [employer], but recognizes he has no business spending that money when other luxuries have  run down his money and he just vacated some savings to pay for a bridge replacement.  Recontextualized use of porn -- clear he is not going out and committing crimes because of it, and it is his prerogative to consume erotica.  Offered that, as Christians, we judge a fruit by its spirits, and an urge by its outcomes -- does it corrupt him, or does it merely assist him, actually?  Acknowledged that he has been without a lover or sexual partner nearly al of his sexually capable life and he anticipates it never to happen for the rest of it  Concern about sexual functioning -- first attempt at self-pleasure in a few months yesterday, disappointed to find he only had ejaculation without orgasm.  Attrib to meds, noted options to discuss with PCP and psychiatrist, any tactics or alternatives that would free up more enjoyable sexual response.  Agreed it may take talking to both both, and if it is psychiatry's bailiwick, still good practice being a legitimate patient if he seizes the day tomorrow.  Therapeutic modalities: Cognitive Behavioral Therapy, Solution-Oriented/Positive Psychology, and Assertiveness/Communication  Mental Status/Observations:  Appearance:   Casual and Neat     Behavior:  Appropriate  Motor:  Normal  Speech/Language:   Clear and Coherent  Affect:  Appropriate  Mood:  dysthymic, more responsive  Thought process:  normal  Thought content:    Worry  Sensory/Perceptual disturbances:    WNL  Orientation:  Fully oriented  Attention:  Good    Concentration:  Good  Memory:  WNL  Insight:    Good  Judgment:   Good  Impulse Control:  Good   Risk Assessment: Danger to Self: No Self-injurious Behavior: No Danger to Others: No Physical Aggression / Violence: No Duty to Warn: No Access to Firearms a concern: No  Assessment of progress:  progressing  Diagnosis:   ICD-10-CM   1. Major depressive disorder, recurrent episode, moderate (HCC)  F33.1     2. Social anxiety disorder   F40.10     3. 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 Sexual dysfunction  R37      Plan:  Watch for conclusion-jumping, remember and practice the flexibility to ask questions -- why a procedure, whether hiring a ride is an option, whether  Other recommendations/advice as may be noted above Continue to utilize previously learned skills ad lib Maintain medication as prescribed and work faithfully with relevant prescriber(s) if any changes are desired or seem indicated Call the clinic on-call service, 988/hotline, present to ER, or call 911 if any life-threatening psychiatric crisis Return for session(s) already scheduled. Already scheduled visit in this office 10/29/2020.  Blanchie Serve, PhD [name], PhD LP Clinical Psychologist, [facility] [address] [place], [place] [address] [phone]

## 2020-10-23 ENCOUNTER — Ambulatory Visit (INDEPENDENT_AMBULATORY_CARE_PROVIDER_SITE_OTHER): Payer: Medicare Other | Admitting: Family Medicine

## 2020-10-23 VITALS — HR 96 | Temp 97.8°F | Ht 69.0 in | Wt 173.2 lb

## 2020-10-23 DIAGNOSIS — R42 Dizziness and giddiness: Secondary | ICD-10-CM | POA: Diagnosis not present

## 2020-10-23 DIAGNOSIS — K5904 Chronic idiopathic constipation: Secondary | ICD-10-CM | POA: Diagnosis not present

## 2020-10-23 DIAGNOSIS — R739 Hyperglycemia, unspecified: Secondary | ICD-10-CM | POA: Diagnosis not present

## 2020-10-23 DIAGNOSIS — N529 Male erectile dysfunction, unspecified: Secondary | ICD-10-CM

## 2020-10-23 DIAGNOSIS — E7800 Pure hypercholesterolemia, unspecified: Secondary | ICD-10-CM | POA: Diagnosis not present

## 2020-10-23 DIAGNOSIS — R972 Elevated prostate specific antigen [PSA]: Secondary | ICD-10-CM

## 2020-10-23 HISTORY — DX: Chronic idiopathic constipation: K59.04

## 2020-10-23 HISTORY — DX: Male erectile dysfunction, unspecified: N52.9

## 2020-10-23 HISTORY — DX: Dizziness and giddiness: R42

## 2020-10-23 LAB — HEMOGLOBIN A1C: Hgb A1c MFr Bld: 5.2 % (ref 4.6–6.5)

## 2020-10-23 NOTE — Progress Notes (Signed)
[facility] [address] [place] [address] Dept: [phone] Dept Fax: [phone]  Office Visit  Subjective:    Patient ID: Richard Wilson, male    DOB: 09-24-1950, 70 y.o..   MRN: [id number]  Chief Complaint  Patient presents with   Acute Visit    C/o having constipation x 1-2 weeks.  He has tried metamucil twice daily.      History of Present Illness:  Patient is in today complaining of recent constipation issues that he would like to address. He notes that last week, he went 4 days with no bowel movements. The last 2 days he has had normal bowel movements. At his initial visit with me on 8/23. Mr. Delman complained of a three week history of constipation. He noted that his previous PA gave him instructions for using Miralax, Dulcolax, and Colace. He used both the Miralax and the Dulcolax in the same day, resulting in watery diarrhea. He notedthat some recent small amounts of blood in his bowel movements. I had advised that he should have a colonoscopy, which he refused. We discussed the use of a daily fiber laxative and increased fluid intake to try and establish regular bowel movements. He is now doing this, though he notes a bloated sensation after drinking water on days where he has no bowel movements. He did not try a laxative during his 4 days of constipation, trying to avoid diarrhea.  As an aside, Mr. Martel mentioned occasional issue with lightheadedness and staggering with certain movements. He denies any room spinning sensation, but this is more of a dysequilibrium. He admits this is more common with bending and standing back up.  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   Thrombocytopenia ([place]) 04/08/2020    Generalized anxiety disorder 01/01/2020   Mild episode of recurrent major depressive disorder ([place]) 01/01/2020   Primary osteoarthritis of both hips 07/30/2019   Chronic kidney disease, stage 3a ([place]) 03/19/2019   Pure hypercholesterolemia 12/15/2018   Arthritis of knee 08/31/2017   Nephrolithiasis 06/22/2016   Obstructive sleep apnea syndrome 04/13/2015   Diverticulosis of large intestine without hemorrhage 09/30/2013   Mild mitral regurgitation 99/37/1696   Diastolic dysfunction 78/93/8101   Elevated PSA 12/22/2012   RBBB (right bundle branch block) 12/03/2012   Past Surgical History:  Procedure Laterality Date   APPENDECTOMY     FOOT TENDON SURGERY Right    TONSILLECTOMY     Family History  Problem Relation Age of Onset   Cancer Mother        Ovarian   Heart disease Mother    Prostate cancer Father    Cancer Maternal Aunt        Breast, metastatic   Throat cancer Maternal Grandfather    Heart attack Paternal Grandfather    Outpatient Medications Prior to Visit  Medication Sig Dispense Refill   aspirin EC 81 MG tablet Take 81 mg by mouth daily. Swallow whole.     BioGaia Probiotic (BIOGAIA/GERBER SOOTHE) LIQD Take 5 drops by mouth daily at 8 pm.     busPIRone (BUSPAR) 15 MG tablet TAKE 1 TABLET(15 MG) BY MOUTH THREE TIMES DAILY 270 tablet 0   clonazePAM (KLONOPIN) 1 MG tablet TAKE 1 TABLET(1 MG) BY MOUTH THREE TIMES DAILY AS NEEDED FOR ANXIETY 90 tablet 1  lamoTRIgine (LAMICTAL) 150 MG tablet Take 1 tablet (150 mg total) by mouth daily. 90 tablet 0   psyllium (METAMUCIL) 58.6 % powder Take 1 packet by mouth in the morning and at bedtime.     sertraline (ZOLOFT) 100 MG tablet Take 2.5 tablets (250 mg total) by mouth daily. 225 tablet 0   traZODone (DESYREL) 150 MG tablet Take by mouth.     No facility-administered medications prior to visit.   Allergies  Allergen Reactions   Garamycin [Gentamicin] Other (See Comments)    Eyes itched and were edematous    Penicillins     Pt reports is childhood allergy and does not remember the reaction    Objective:   Today's Vitals   10/23/20 0808  Pulse: 96  Temp: 97.8 F (36.6 C)  TempSrc: Temporal  SpO2: 97%  Weight: 173 lb 3.2 oz (78.6 kg)  Height: 5\' 9"  (1.753 m)   Body mass index is 25.58 kg/m.   General: Well developed, well nourished. No acute distress. Neurological. LE strength 5/5. DTRs of the LE are 2+ bilat. Normal gait noted. The lightheadedness is reproducible with bending nad then standing up. Psych: Alert and oriented. Normal mood and affect.  Health Maintenance Due  Topic Date Due   COVID-19 Vaccine (1) Never done   Hepatitis C Screening  Never done   TETANUS/TDAP  Never done   Zoster Vaccines- Shingrix (1 of 2) Never done   INFLUENZA VACCINE  09/01/2020   Lab Results CMP Latest Ref Rng & Units 09/24/2020  Glucose 70 - 99 mg/dL 134(H)  BUN 6 - 23 mg/dL 18  Creatinine 0.40 - 1.50 mg/dL 1.45  Sodium 135 - 145 mEq/L 139  Potassium 3.5 - 5.1 mEq/L 4.7  Chloride 96 - 112 mEq/L 104  CO2 19 - 32 mEq/L 24  Calcium 8.4 - 10.5 mg/dL 9.5   Lab Results  Component Value Date   CHOL 213 (H) 09/24/2020   HDL 45.70 09/24/2020   LDLCALC 145 (H) 09/24/2020   TRIG 109.0 09/24/2020   CHOLHDL 5 09/24/2020   Lab Results  Component Value Date   TSH 1.81 09/24/2020   Lab Results  Component Value Date   PSA 8.50 (H) 09/24/2020     Assessment & Plan:   1. Chronic idiopathic constipation Mr. Jalomo is now using daily fiber and has increased fluids. We discussed there being a range of "normal" for how frequent bowel movements should occur. I recommended if he had been more than 2 days without a bowel movement, he could take a Dulcolax at bedtime. We will continue to follow this.  2. Orthostatic lightheadedness Mr. Missey appears to be having some orthostasis. He has no sign of a cardiac condition or a neurological condition. I recommended he discuss these symptoms with his  psychiatrist, as this could be a side effect of hi psychotherapeutics or the combination he is on.  3. Hyperglycemia On his last blood draw, Mr. Mazon had a high blood sugar. I recommend we check an A1c to assess for diabetes. - Hemoglobin A1c  4. Pure hypercholesterolemia We discussed the elevated LDL cholesterol. Mr. Dineen cardiac risk was 10.3 %(intermediate) risk of a MI or stroke in the next 10 years.  He will attempt to obtain old records so we can assess how this might have changed over time. We will hold off on starting a statin at this point.  5. Elevated PSA [name] PSA is elevated. He has a past history of this, but  he cannot recall how high this might have been. Mr. Rosenboom will sing a release related to his prior practice and see if he can obtain prior PSA results for comparison. We will repeat his PSA in 6 months.  Haydee Salter, MD

## 2020-10-29 ENCOUNTER — Other Ambulatory Visit: Payer: Self-pay

## 2020-10-29 ENCOUNTER — Ambulatory Visit (INDEPENDENT_AMBULATORY_CARE_PROVIDER_SITE_OTHER): Payer: Medicare Other | Admitting: Psychiatry

## 2020-10-29 ENCOUNTER — Encounter: Payer: Self-pay | Admitting: Physician Assistant

## 2020-10-29 ENCOUNTER — Ambulatory Visit (INDEPENDENT_AMBULATORY_CARE_PROVIDER_SITE_OTHER): Payer: Medicare Other | Admitting: Physician Assistant

## 2020-10-29 DIAGNOSIS — F411 Generalized anxiety disorder: Secondary | ICD-10-CM

## 2020-10-29 DIAGNOSIS — F341 Dysthymic disorder: Secondary | ICD-10-CM

## 2020-10-29 DIAGNOSIS — F330 Major depressive disorder, recurrent, mild: Secondary | ICD-10-CM

## 2020-10-29 DIAGNOSIS — F4010 Social phobia, unspecified: Secondary | ICD-10-CM

## 2020-10-29 DIAGNOSIS — F331 Major depressive disorder, recurrent, moderate: Secondary | ICD-10-CM | POA: Diagnosis not present

## 2020-10-29 MED ORDER — SERTRALINE HCL 100 MG PO TABS: 200.0000 mg | ORAL_TABLET | Freq: Every day | ORAL | 0 refills | Status: DC

## 2020-10-29 MED ORDER — CLONAZEPAM 1 MG PO TABS: 1.0000 mg | ORAL_TABLET | Freq: Three times a day (TID) | ORAL | 5 refills | Status: DC | PRN

## 2020-10-29 NOTE — Progress Notes (Signed)
Psychotherapy Progress Note [facility] [name], PhD LP  Patient ID: Richard Wilson     MRN: [id number] Therapy format: Individual psychotherapy Date: 10/29/2020      Start: 8:12a     Stop: 9:00a     Time Spent: 48 min Location: In-person   Session narrative (presenting needs, interim history, self-report of stressors and symptoms, applications of prior therapy, status changes, and interventions made in session) Awaiting his new partial plate, working with an irritating broken one, so largely going without.  Anticipates 4 weeks.  Did decide not to continue the Alpha course at church, just disinterested -- and somewhat put off -- by the content.  On reflection, realizes he isn't that clear about a relationship with God, more going through the motions, but disaffected, empty, mostly just following family tradition to have someplace to be.  Validated as one of the actually normal, common places for people to be as church members, can be worth reevaluating, and safe to engage his priest about it if he's willing and interested.  Entertained fantasy of Jesus visiting in person, what would he encounter, was able to state much more forgiving than a lot of what has been portrayed.  Affirmed with scripture references and [place]'s own understanding the image of being open and caring toward questions and questioners, offering that he may find there are others should he engage faith discussions and study more frankly and openly, whether it is the Alpha class or something else.  Notes gay man at church he has approached for lunch before, purportedly just as friends, though he was at the outset, interested in a romantic relationship, largely feeling that there are no other choices, seemingly the only other ne of his kind in this place.  Got out once, just a drive through lunch, but since then feels he's made excuses.  To his credit the man asked explicitly which kind of relationship [name] wanted,  and told him he's not open for a romantic one.  Since then, always greets warmly but seems to agree in principle about getting together but can never make a specific plan.  Waffling about whether it's worth it, confronted that he brought it up because he must feel there is some chance of camaraderie, at least, suggested it's worth checking again, this time ready to say his interest is changed, and it would be platonic if he's open to it.  Noncommittal, will think about it.  Therapeutic modalities: Cognitive Behavioral Therapy, Solution-Oriented/Positive Psychology, Humanistic/Existential, and Faith-sensitive  Mental Status/Observations:  Appearance:   Casual     Behavior:  Appropriate  Motor:  Normal  Speech/Language:   Clear and Coherent and affectation  Affect:  Appropriate  Mood:  dysthymic  Thought process:  normal  Thought content:    WNL and preoccupation  Sensory/Perceptual disturbances:    WNL  Orientation:  Fully oriented  Attention:  Good    Concentration:  Good  Memory:  WNL  Insight:    Good  Judgment:   Good  Impulse Control:  Fair   Risk Assessment: Danger to Self: No Self-injurious Behavior: No Danger to Others: No Physical Aggression / Violence: No Duty to Warn: No Access to Firearms a concern: No  Assessment of progress:  stabilized  Diagnosis:   ICD-10-CM   1. Major depressive disorder, recurrent episode, moderate (HCC)  F33.1     2. Social anxiety disorder  F40.10     3. 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  Plan:  More important what spirit than which choice to make about Alpha class -- option to go for fellowship's sake and the chance to discover the (small) community can handle questioning or to not bother for the sake of sparing pretending, impostor anxiety, and another round of feeling like a misfit Separate option to approach the church mate mentioned, and explicitly offer that his interest is strictly as friends and comrades, see if the message back  changes and whether it can prove better that he can be honest and have company in the world at the same time Other recommendations/advice as may be noted above Continue to utilize previously learned skills ad lib Maintain medication as prescribed and work faithfully with relevant prescriber(s) if any changes are desired or seem indicated Call the clinic on-call service, 988/hotline, present to ER, or call 911 if any life-threatening psychiatric crisis Return for session(s) already scheduled. Already scheduled visit in this office 10/29/2020.  Blanchie Serve, PhD [name], PhD LP Clinical Psychologist, [facility] [address] [place], [place] [address] [phone]

## 2020-10-29 NOTE — Progress Notes (Signed)
[facility]  Patient ID: Richard Wilson,  MRN: [id number]  PCP: Haydee Salter, MD  Date of Evaluation: 10/29/2020  time spent:20 minutes  Chief Complaint:  Chief Complaint   Anxiety; Depression; Insomnia; Follow-up      HISTORY/CURRENT STATUS: HPI For routine med check.  At LOV 6 wks, zoloft was increased. He wonders if he should go off of it. Doesn't know what med does what. Thinks the increase in Zoloft might have helped 'a little.'  Still has low energy. But is going to church which is good, on the [employer], gets it ready for mass. He reads a lot, goes to [employer], sometimes goes to a class at church, not sure he likes it. Not crying easily. Appetite is good. Sleeps well with the Trazodone. No SI/HI.   Still is anxious, 'my imagination runs away with me."  The Klonopin helps.  He takes it 3 times a day routinely, not as needed.  Not having panic attacks just more generalized, feels like something bad is going to happen if he does not take the Klonopin.  Patient denies increased energy with decreased need for sleep, no increased talkativeness, no racing thoughts, no impulsivity or risky behaviors, no increased spending, no increased libido, no grandiosity, no increased irritability or anger, and no hallucinations.  Denies dizziness, syncope, seizures, numbness, tingling, tremor, tics, unsteady gait, slurred speech, confusion. Denies muscle or joint pain, stiffness, or dystonia.  Individual Medical History/ Review of Systems: Changes? :No     Past medications for mental health diagnoses include: Rozerem, Trazodone caused vivid dreams, Xanax, Buspar, mirtazapine caused excessive somnolence and fatigue with flulike symptoms, Belsomra, propranolol for tremor, Zoloft, Abilify caused tremor, Klonopin, Effexor, Ativan, Wellbutrin, Nardil, lithium, gabapentin, Librium   Pertinent info after reviewing Dr. Evelena Leyden notes, his previous psychiatrist in [facility]. Past suicide attempt in September 1996, by taking Tylenol.  He was admitted at that time to a psychiatric hospital in [facility].  Admitted to psych hospital multiple times in the past for "nervous breakdowns."  January 1985, February 1986, October 1991, September 1996, July 2000.  Allergies: Garamycin [gentamicin] and Penicillins  Current Medications:  Current Outpatient Medications:    aspirin EC 81 MG tablet, Take 81 mg by mouth daily. Swallow whole., Disp: , Rfl:    BioGaia Probiotic (BIOGAIA/GERBER SOOTHE) LIQD, Take 5 drops by mouth daily at 8 pm., Disp: , Rfl:    busPIRone (BUSPAR) 15 MG tablet, TAKE 1 TABLET(15 MG) BY MOUTH THREE TIMES DAILY, Disp: 270 tablet, Rfl: 0   lamoTRIgine (LAMICTAL) 150 MG tablet, Take 1 tablet (150 mg total) by mouth daily., Disp: 90 tablet, Rfl: 0   psyllium (METAMUCIL) 58.6 % powder, Take 1 packet by mouth in the morning and at bedtime., Disp: , Rfl:    traZODone (DESYREL) 150 MG tablet, Take by mouth., Disp: , Rfl:    clonazePAM (KLONOPIN) 1 MG tablet, Take 1 tablet (1 mg total) by mouth 3 (three) times daily as needed for anxiety., Disp: 90 tablet, Rfl: 5   sertraline (ZOLOFT) 100 MG tablet, Take 2 tablets (200 mg total) by mouth daily., Disp: 180 tablet, Rfl: 0 Medication Side Effects: sexual dysfunction  Family Medical/ Social History: Changes?  no  MENTAL HEALTH EXAM:  There were no vitals taken for this visit.There is no height or weight on file to calculate BMI.  General Appearance: Casual and Well Groomed  Eye Contact:  Good  Speech:  Clear and Coherent and  Normal Rate  Volume:  Normal  Mood:  Depressed and aloof  Affect:  Depressed and Restricted  Thought Process:  Goal Directed and Descriptions of Associations: Intact  Orientation:  Full (Time, Place, and Person)  Thought Content: Logical   Suicidal Thoughts:  No  Homicidal Thoughts:  No  Memory:  WNL  Judgement:  Good  Insight:  Good  Psychomotor Activity:   Normal  Concentration:  Concentration: Good  Recall:  Good  Fund of Knowledge: Good  Language: Good  Assets:  Desire for Improvement  ADL's:  Intact  Cognition: WNL  Prognosis:  Good    DIAGNOSES:    ICD-10-CM   1. Social anxiety disorder  F40.10     2. Mild episode of recurrent major depressive disorder (HCC)  F33.0     3. Generalized anxiety disorder  F41.1         Receiving Psychotherapy: Yes    Dr. Jonni Sanger Mitchum.  Therapy is going well.   RECOMMENDATIONS:  PDMP was reviewed.  Last Klonopin filled 10/15/2020. I provided 20 minutes of face to face time during this encounter, including time spent before and after the visit in records review, medical decision making, and charting.  We discussed his medications and he really wants to decrease the Zoloft, that is fine to go back to 200 mg but if the anxiety or depression increase then we need to go back to 250 mg.  Another option would be to increase the Lamictal.  Or increasing BuSpar if anxiety worsens. Reminded him that it is not necessary to take the Klonopin round-the-clock if he does not need it. Continue Buspar 15mg , 1 po tid.  Continue Klonopin 1 mg, 1 p.o. 3 times daily as needed. Continue Lamictal 150 mg, qd. Decrease Zoloft 100 mg to 2 p.o. daily. Continue counseling with Dr. [name]. Return in 6 weeks.  Donnal Moat, PA-C

## 2020-11-11 ENCOUNTER — Ambulatory Visit (INDEPENDENT_AMBULATORY_CARE_PROVIDER_SITE_OTHER): Payer: Medicare Other | Admitting: Psychiatry

## 2020-11-11 ENCOUNTER — Other Ambulatory Visit: Payer: Self-pay

## 2020-11-11 DIAGNOSIS — F341 Dysthymic disorder: Secondary | ICD-10-CM | POA: Diagnosis not present

## 2020-11-11 DIAGNOSIS — F411 Generalized anxiety disorder: Secondary | ICD-10-CM

## 2020-11-11 DIAGNOSIS — F331 Major depressive disorder, recurrent, moderate: Secondary | ICD-10-CM

## 2020-11-11 DIAGNOSIS — F4010 Social phobia, unspecified: Secondary | ICD-10-CM | POA: Diagnosis not present

## 2020-11-11 NOTE — Progress Notes (Signed)
Psychotherapy Progress Note [facility] [name], PhD LP  Patient ID: Richard Wilson [facility] [name])    MRN: [id number] Therapy format: Individual psychotherapy Date: 11/11/2020      Start: 10:15a     Stop: 11:03a     Time Spent: 48 min Location: In-person   Session narrative (presenting needs, interim history, self-report of stressors and symptoms, applications of prior therapy, status changes, and interventions made in session) Recognizing tendency to catastrophize about matters.  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  Quandary about whom to make his heir, since he has no children and does not feel close to sister and family.  Considering engaging his church to see if it/leadership would serve as Therapist, sports and bequeath the church.    Discussed hx with father's anger, sternness and judgment that kept him closeted so many years.  Also judgment of the man and resentment for how he affected mother for so long.  Tried to imagine father changed in afterlife, spiritually cleaned up and possibly infused with God's grace, but could not do so.  Validate the need for now to hold onto his anger.  Notes vanity about his appearance -- pot belly happening, hair thinning.  Normalized as able.  Therapeutic modalities: Cognitive Behavioral Therapy, Solution-Oriented/Positive Psychology, Customer service manager, and Faith-sensitive  Mental Status/Observations:  Appearance:   Casual     Behavior:  Appropriate  Motor:  Normal  Speech/Language:   Clear and Coherent  Affect:  Appropriate  Mood:  dysthymic  Thought process:  normal  Thought content:    WNL  Sensory/Perceptual disturbances:    WNL  Orientation:  Fully oriented  Attention:  Good    Concentration:  Fair  Memory:  WNL  Insight:    Good  Judgment:   Good  Impulse Control:  Fair   Risk Assessment: Danger to Self: No Self-injurious Behavior: No Danger to Others: No Physical Aggression / Violence: No Duty to  Warn: No Access to Firearms a concern: No  Assessment of progress:  progressing  Diagnosis:   ICD-10-CM   1. Major depressive disorder, recurrent episode, moderate (HCC)  F33.1     2. Social anxiety disorder  F40.10     3. Generalized anxiety disorder  F41.1     4. 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      Plan:  Alpha class still optional Optional to approach church acquaintance as friends Option to approach church as executor/heir If interested, try to imagine father cleared of anger/resentment in afterlife Self-affirm continuing permissions to decide his involvements, work out relationships, and be less than perfect Other recommendations/advice as may be noted above Continue to utilize previously learned skills ad lib Maintain medication as prescribed and work faithfully with relevant prescriber(s) if any changes are desired or seem indicated Call the clinic on-call service, 988/hotline, present to ER, or call 911 if any life-threatening psychiatric crisis Return for session(s) already scheduled. Already scheduled visit in this office 11/18/2020.  Blanchie Serve, PhD [name], PhD LP Clinical Psychologist, [facility] [address], [place], [place] [address] [phone]

## 2020-11-18 ENCOUNTER — Other Ambulatory Visit: Payer: Self-pay

## 2020-11-18 ENCOUNTER — Ambulatory Visit (INDEPENDENT_AMBULATORY_CARE_PROVIDER_SITE_OTHER): Payer: Medicare Other | Admitting: Psychiatry

## 2020-11-18 DIAGNOSIS — F4010 Social phobia, unspecified: Secondary | ICD-10-CM

## 2020-11-18 DIAGNOSIS — F411 Generalized anxiety disorder: Secondary | ICD-10-CM | POA: Diagnosis not present

## 2020-11-18 DIAGNOSIS — F341 Dysthymic disorder: Secondary | ICD-10-CM

## 2020-11-18 DIAGNOSIS — F331 Major depressive disorder, recurrent, moderate: Secondary | ICD-10-CM | POA: Diagnosis not present

## 2020-11-18 NOTE — Progress Notes (Signed)
Psychotherapy Progress Note [facility] [name], PhD LP  Patient ID: Makana [facility] [name])    MRN: [id number] Therapy format: Individual psychotherapy Date: 11/18/2020      Start: 10:13a     Stop: 11:00a     Time Spent: 47 min Location: In-person   Session narrative (presenting needs, interim history, self-report of stressors and symptoms, applications of prior therapy, status changes, and interventions made in session) Continues busy heading up altar guild.  Stopped going to Alpha class.  Got out for Sunday lunch with a young man he figures to be closeted, and gracefully kept from introducing sexuality into the experience.  No other efforts to socialize, develop connections.  Notes still daily porn use, no physical response, but the idea it satisfied his lust.  Interpreted more as a Retail banker for loneliness than an indication of perversity.    Been thinking over old times and hurts with sister Nunzio Cory, has engaged law firm now to rewrite will and POA, reassign responsibilities to [employer].  Support/empathy provided.   Therapeutic modalities: Cognitive Behavioral Therapy, Solution-Oriented/Positive Psychology, and Ego-Supportive  Mental Status/Observations:  Appearance:   Neat     Behavior:  Appropriate  Motor:  Normal  Speech/Language:   Clear and Coherent  Affect:  Appropriate  Mood:  dysthymic  Thought process:  normal  Thought content:    WNL  Sensory/Perceptual disturbances:    WNL  Orientation:  Fully oriented  Attention:  Good    Concentration:  Fair  Memory:  WNL  Insight:    Good  Judgment:   Good  Impulse Control:  Fair   Risk Assessment: Danger to Self: No Self-injurious Behavior: No Danger to Others: No Physical Aggression / Violence: No Duty to Warn: No Access to Firearms a concern: No  Assessment of progress:  progressing  Diagnosis:   ICD-10-CM   1. Major depressive disorder, recurrent episode, moderate  (HCC)  F33.1     2. 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 Social anxiety disorder  F40.10     4. Generalized anxiety disorder  F41.1      Plan:  Self-affirm that the impact of porn use for him is in whether it simply soothes or reinforces loneliness [employer], always optional what to volunteer/involve in Continue socializing as able, low demands approach Other recommendations/advice as may be noted above Continue to utilize previously learned skills ad lib Maintain medication as prescribed and work faithfully with relevant prescriber(s) if any changes are desired or seem indicated Call the clinic on-call service, 988/hotline, present to ER, or call 911 if any life-threatening psychiatric crisis Return for session(s) already scheduled. Already scheduled visit in this office 11/24/2020.  Blanchie Serve, PhD [name], PhD LP Clinical Psychologist, [facility] [address], [place], [place] [address] [phone]

## 2020-11-24 ENCOUNTER — Other Ambulatory Visit: Payer: Self-pay

## 2020-11-24 ENCOUNTER — Ambulatory Visit (INDEPENDENT_AMBULATORY_CARE_PROVIDER_SITE_OTHER): Payer: Medicare Other | Admitting: Psychiatry

## 2020-11-24 DIAGNOSIS — F330 Major depressive disorder, recurrent, mild: Secondary | ICD-10-CM | POA: Diagnosis not present

## 2020-11-24 DIAGNOSIS — F341 Dysthymic disorder: Secondary | ICD-10-CM

## 2020-11-24 DIAGNOSIS — F411 Generalized anxiety disorder: Secondary | ICD-10-CM | POA: Diagnosis not present

## 2020-11-24 DIAGNOSIS — F4010 Social phobia, unspecified: Secondary | ICD-10-CM | POA: Diagnosis not present

## 2020-11-24 NOTE — Progress Notes (Signed)
Psychotherapy Progress Note [facility] [name], PhD LP  Patient ID: [facility] [name])    MRN: [id number] Therapy format: Individual psychotherapy Date: 11/24/2020      Start: 10:09a     Stop: 10:58a     Time Spent: 49 min Location: In-person   Session narrative (presenting needs, interim history, self-report of stressors and symptoms, applications of prior therapy, status changes, and interventions made in session) "Troublesome week", knows he can take some things incorrectly and get irrationally riled up.  Narrates this week at church dealing with a refillable candle he could not figure out, improvising a solution, worrying hard about how it would work out, and finding himself resenting "being put" in a position to have to.  Narrates a range of other, momentarily bitter, reactions to things others have been doing and to seeming messages about what he should want, or do, or submit to in working for [employer], reading in "figuring [place] will do it" because he is unattached.  Supportively confronted mindreading and conclusion-jumping errors, and fantasies of laying out on Sunday and letting people see what happens when the faithful volunteer goes missing.  Acknowledged the temptation to mull over such things for the momentary freedom to anger they give, while honoring the fact that he recognizes it and does not indulge it in reacting to other people, and the fact that he has been subject to some people's expectations at other times, but in his church roles, far more likely nobody knows the difference, or the impression, and that he is sometimes displacing anger at himself for saying compulsive yesses.  Affirmed growing fluency in saying no to things, including the ride request that became such a navigational challenge and the evening class he did not like.  Encouraged in granting others their tastes, assuming less about what gets thought about him, and  maintaining sober awareness that it is his own tendencies to compulsive agreement (for loneliness or guilt) that he is fundamentally irritated by.  Also titillated by the organist, on whom he has a crush but is married (suspected closet).  Found himself offering to get him some sherry before worship, began to question himself whether his motives were pure.  On reflection, realizes he was toying with the fantasy of seducing him to express his suspected sexuality more openly and to choose [facility]  Agreed no harm done, and none would be, but responsible, actually, to ask the question of himself.    Has atty appt next week to rework POA and will.  Agreed it will be helpful, settle some feelings, and even if he has mixed motives -- relieving relatives and in some smaller measure eschewing them -- it's Ok to "let the wheat and the weeds grow up together" and "sort them out at the harvest".    Therapeutic modalities: Cognitive Behavioral Therapy, Solution-Oriented/Positive Psychology, and Faith-sensitive  Mental Status/Observations:  Appearance:   Casual     Behavior:  Appropriate  Motor:  Normal  Speech/Language:   Affected, fluent  Affect:  Momentarily and mildly flamboyant  Mood:  dysthymic, responsive  Thought process:  normal  Thought content:    WNL  Sensory/Perceptual disturbances:    WNL  Orientation:  Fully oriented  Attention:  Good    Concentration:  Good  Memory:  WNL  Insight:    Good  Judgment:   Good  Impulse Control:  Good   Risk Assessment: Danger to Self: No Self-injurious Behavior: No Danger to Others: No  Physical Aggression / Violence: No Duty to Warn: No Access to Firearms a concern: No  Assessment of progress:  progressing  Diagnosis:   ICD-10-CM   1. Mild episode of recurrent major depressive disorder ([place])  F33.0     2. 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 Generalized anxiety disorder  F41.1     4. Social anxiety disorder  F40.10      Plan:  Practice  sober awareness that it is his own tendencies to compulsive agreement (for loneliness or guilt) that he is fundamentally irritated by, forgive, and practice authentic yesses and nos to task Plans as made to realign will and POA Prior recommendations to identify reasonable opportunities to socialize beyond church Other recommendations/advice as may be noted above Continue to utilize previously learned skills ad lib Maintain medication as prescribed and work faithfully with relevant prescriber(s) if any changes are desired or seem indicated Call the clinic on-call service, 988/hotline, present to ER, or call 911 if any life-threatening psychiatric crisis Return for session(s) already scheduled. Already scheduled visit in this office 12/05/2020.  Blanchie Serve, PhD [name], PhD LP Clinical Psychologist, [facility] [address] [place], [place] [address] [phone]

## 2020-11-26 ENCOUNTER — Other Ambulatory Visit: Payer: Self-pay

## 2020-11-27 ENCOUNTER — Ambulatory Visit (INDEPENDENT_AMBULATORY_CARE_PROVIDER_SITE_OTHER): Payer: Medicare Other | Admitting: Family Medicine

## 2020-11-27 ENCOUNTER — Encounter: Payer: Self-pay | Admitting: Family Medicine

## 2020-11-27 ENCOUNTER — Ambulatory Visit (INDEPENDENT_AMBULATORY_CARE_PROVIDER_SITE_OTHER): Payer: Medicare Other

## 2020-11-27 VITALS — BP 108/66 | HR 65 | Temp 97.3°F | Ht 69.0 in | Wt 169.0 lb

## 2020-11-27 DIAGNOSIS — M1611 Unilateral primary osteoarthritis, right hip: Secondary | ICD-10-CM

## 2020-11-27 MED ORDER — NAPROXEN 500 MG PO TABS: 500.0000 mg | ORAL_TABLET | Freq: Two times a day (BID) | ORAL | 0 refills | Status: DC

## 2020-11-27 NOTE — Progress Notes (Signed)
[facility] [address] [place] [address] Dept: [phone] Dept Fax: [phone]  Office Visit  Subjective:    Patient ID: Richard Wilson, male    DOB: 1950-04-29, 70 y.o..   MRN: [id number]  Chief Complaint  Patient presents with   Acute Visit    C/o having RT hip pain x 1 year.  No OTC meds taken.     History of Present Illness:  Patient is in today for evaluation of right hip pain. He notes that this has been bothering him for more than a year, but seems to be more painful recently. He denies prior trauma. He notes the pain is increased when he gets up to standing or down to sitting. He does not note pain with walking or standing still. He does also note increased pain when rolling over in the bed at night. He states he was told in the past that he either had bone spurs or arthritis int he hip. Mr. Tsou has not tried any physical measures or taken any medication for this.  Past Medical History: Patient Active Problem List   Diagnosis Date Noted   Chronic idiopathic constipation 10/23/2020   Orthostatic lightheadedness 10/23/2020   Erectile dysfunction 10/23/2020   Hyperglycemia 09/24/2020   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   Thrombocytopenia ([place]) 04/08/2020   Generalized anxiety disorder 01/01/2020   Mild episode of recurrent major depressive disorder ([place]) 01/01/2020   Primary osteoarthritis of both hips 07/30/2019   Chronic kidney disease, stage 3a ([place]) 03/19/2019   Pure hypercholesterolemia 12/15/2018   Arthritis of knee 08/31/2017   Nephrolithiasis 06/22/2016   Obstructive sleep apnea syndrome 04/13/2015   Diverticulosis of large intestine without hemorrhage 09/30/2013   Mild mitral regurgitation 34/19/6222   Diastolic dysfunction 97/98/9211   Elevated PSA 12/22/2012   RBBB (right bundle branch block) 12/03/2012   Past Surgical History:  Procedure Laterality Date    APPENDECTOMY     FOOT TENDON SURGERY Right    TONSILLECTOMY     Family History  Problem Relation Age of Onset   Cancer Mother        Ovarian   Heart disease Mother    Prostate cancer Father    Cancer Maternal Aunt        Breast, metastatic   Throat cancer Maternal Grandfather    Heart attack Paternal Grandfather    Outpatient Medications Prior to Visit  Medication Sig Dispense Refill   aspirin EC 81 MG tablet Take 81 mg by mouth daily. Swallow whole.     BioGaia Probiotic (BIOGAIA/GERBER SOOTHE) LIQD Take 5 drops by mouth daily at 8 pm.     busPIRone (BUSPAR) 15 MG tablet TAKE 1 TABLET(15 MG) BY MOUTH THREE TIMES DAILY 270 tablet 0   clonazePAM (KLONOPIN) 1 MG tablet Take 1 tablet (1 mg total) by mouth 3 (three) times daily as needed for anxiety. 90 tablet 5   lamoTRIgine (LAMICTAL) 150 MG tablet Take 1 tablet (150 mg total) by mouth daily. 90 tablet 0   psyllium (METAMUCIL) 58.6 % powder Take 1 packet by mouth in the morning and at bedtime.     sertraline (ZOLOFT) 100 MG tablet Take 2 tablets (200 mg total) by mouth daily. 180 tablet 0   traZODone (DESYREL) 150 MG tablet Take by mouth.     No facility-administered medications prior to visit.   Allergies  Allergen Reactions   Garamycin [Gentamicin] Other (See Comments)    Eyes itched  and were edematous   Penicillins     Pt reports is childhood allergy and does not remember the reaction     Objective:   Today's Vitals   11/27/20 0758  BP: 108/66  Pulse: 65  Temp: (!) 97.3 F (36.3 C)  TempSrc: Temporal  SpO2: 97%  Weight: 169 lb (76.7 kg)  Height: 5\' 9"  (1.753 m)   Body mass index is 24.96 kg/m.   General: Well developed, well nourished. No acute distress. Extremities: Full ROM of right hip. Mild pain on palpation over the greater trochanter. Log rolling   normal. FABER negative for pain. Psych: Alert and oriented. Normal mood and affect.  Health Maintenance Due  Topic Date Due   Pneumonia Vaccine 53+ Years  old (1 - PCV) Never done   Hepatitis C Screening  Never done   TETANUS/TDAP  Never done   Zoster Vaccines- Shingrix (1 of 2) Never done   COVID-19 Vaccine (2 - Pfizer risk series) 10/30/2020   Imaging: Right hip x-ray-  There appears to be some narrowing of the joint space inferiorly in the hip.     Assessment & Plan:   1. Arthritis of right hip The x-ray suggests this hip pain is due to arthritis. I recommend he take a consistent dose of an NSAID for 7 days. and apply heat. I will refer him to orthopedics for evaluation of other treatment options.  - DG Hip Unilat W OR W/O Pelvis 2-3 Views Right - naproxen (NAPROSYN) 500 MG tablet; Take 1 tablet (500 mg total) by mouth 2 (two) times daily with a meal.  Dispense: 30 tablet; Refill: 0 - Ambulatory referral to Orthopedic Surgery  Haydee Salter, MD

## 2020-11-27 NOTE — Patient Instructions (Signed)
Hip Pain The hip is the joint between the upper legs and the lower pelvis. The bones, cartilage, tendons, and muscles of your hip joint support your body and allow you to move around. Hip pain can range from a minor ache to severe pain in one or both of your hips. The pain may be felt on the inside of the hip joint near the groin, or on the outside near the buttocks and upper thigh. You may also have swelling or stiffness in your hip area. Follow these instructions at home: Managing pain, stiffness, and swelling   If directed, put ice on the painful area. To do this: Put ice in a plastic bag. Place a towel between your skin and the bag. Leave the ice on for 20 minutes, 2-3 times a day. If directed, apply heat to the affected area as often as told by your health care provider. Use the heat source that your health care provider recommends, such as a moist heat pack or a heating pad. Place a towel between your skin and the heat source. Leave the heat on for 20-30 minutes. Remove the heat if your skin turns bright red. This is especially important if you are unable to feel pain, heat, or cold. You may have a greater risk of getting burned. Activity Do exercises as told by your health care provider. Avoid activities that cause pain. General instructions  Take over-the-counter and prescription medicines only as told by your health care provider. Keep a journal of your symptoms. Write down: How often you have hip pain. The location of your pain. What the pain feels like. What makes the pain worse. Sleep with a pillow between your legs on your most comfortable side. Keep all follow-up visits as told by your health care provider. This is important. Contact a health care provider if: You cannot put weight on your leg. Your pain or swelling continues or gets worse after one week. It gets harder to walk. You have a fever. Get help right away if: You fall. You have a sudden increase in pain and  swelling in your hip. Your hip is red or swollen or very tender to touch. Summary Hip pain can range from a minor ache to severe pain in one or both of your hips. The pain may be felt on the inside of the hip joint near the groin, or on the outside near the buttocks and upper thigh. Avoid activities that cause pain. Write down how often you have hip pain, the location of the pain, what makes it worse, and what it feels like. This information is not intended to replace advice given to you by your health care provider. Make sure you discuss any questions you have with your health care provider. Document Revised: 06/05/2018 Document Reviewed: 06/05/2018 Elsevier Patient Education  2022 Elsevier Inc.  

## 2020-12-02 ENCOUNTER — Other Ambulatory Visit: Payer: Self-pay

## 2020-12-02 ENCOUNTER — Encounter: Payer: Self-pay | Admitting: Orthopaedic Surgery

## 2020-12-02 ENCOUNTER — Ambulatory Visit (INDEPENDENT_AMBULATORY_CARE_PROVIDER_SITE_OTHER): Payer: Medicare Other | Admitting: Orthopaedic Surgery

## 2020-12-02 DIAGNOSIS — M25551 Pain in right hip: Secondary | ICD-10-CM

## 2020-12-02 NOTE — Progress Notes (Signed)
Office Visit Note   Patient: Geraldo Haris           Date of Birth: Sep 01, 1950           MRN: [id number] Visit Date: 12/02/2020              Requested by: Haydee Salter, MD [place],  [place] [address] PCP: Haydee Salter, MD   Assessment & Plan: Visit Diagnoses:  1. Pain in right hip     Plan: Impression is chronic lateral right hip pain.  Treatment options discussed to include a cortisone injection versus MRI versus home exercises and continuing to take Aleve.  Patient would like to just do home exercises and continue with Aleve for now.  We will see him back as needed.  Follow-Up Instructions: No follow-ups on file.   Orders:  No orders of the defined types were placed in this encounter.  No orders of the defined types were placed in this encounter.     Procedures: No procedures performed   Clinical Data: No additional findings.   Subjective: Chief Complaint  Patient presents with   Right Hip - Pain    Mr. Godek is a 70 year old gentleman retired from [employer] who comes in for lateral right hip pain for about a year.  Denies any previous surgeries or injuries to the area.  Denies any radicular symptoms.  Denies any groin or buttock pain.  He has pain when getting up and down from a seated position.  He takes Aleve twice a day which has helped.  In the last year he has been seeing his PCP Dr. Gena Fray for this who has also sent him to physical therapy and unclear as to whether an injection has been performed.  Physical therapy has not made a huge improvement.   Review of Systems  Constitutional: Negative.   All other systems reviewed and are negative.   Objective: Vital Signs: There were no vitals taken for this visit.  Physical Exam Vitals and nursing note reviewed.  Constitutional:      Appearance: He is well-developed.  Pulmonary:     Effort: Pulmonary effort is normal.  Abdominal:     Palpations: Abdomen is soft.  Skin:     General: Skin is warm.  Neurological:     Mental Status: He is alert and oriented to person, place, and time.  Psychiatric:        Behavior: Behavior normal.        Thought Content: Thought content normal.        Judgment: Judgment normal.    Ortho Exam  Right hip examination shows full range of motion without pain.  Negative sciatic tension signs.  Slight tenderness to the lateral aspect of the hip.  Impression  Specialty Comments:  No specialty comments available.  Imaging: No results found.   PMFS History: Patient Active Problem List   Diagnosis Date Noted   Chronic idiopathic constipation 10/23/2020   Orthostatic lightheadedness 10/23/2020   Erectile dysfunction 10/23/2020   Hyperglycemia 09/24/2020   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   Thrombocytopenia ([place]) 04/08/2020   Generalized anxiety disorder 01/01/2020   Mild episode of recurrent major depressive disorder ([place]) 01/01/2020   Primary osteoarthritis of both hips 07/30/2019   Chronic kidney disease, stage 3a ([place]) 03/19/2019   Pure hypercholesterolemia 12/15/2018   Arthritis of knee 08/31/2017   Nephrolithiasis 06/22/2016   Obstructive sleep apnea syndrome 04/13/2015   Diverticulosis of  large intestine without hemorrhage 09/30/2013   Mild mitral regurgitation 84/21/0312   Diastolic dysfunction 81/18/8677   Elevated PSA 12/22/2012   RBBB (right bundle branch block) 12/03/2012   Past Medical History:  Diagnosis Date   Anxiety    Depression    Hyperlipidemia    Tremor     Family History  Problem Relation Age of Onset   Cancer Mother        Ovarian   Heart disease Mother    Prostate cancer Father    Cancer Maternal Aunt        Breast, metastatic   Throat cancer Maternal Grandfather    Heart attack Paternal Grandfather     Past Surgical History:  Procedure Laterality Date   APPENDECTOMY     FOOT TENDON SURGERY Right    TONSILLECTOMY     Social History    Occupational History   Occupation: Disability    Comment: 2001 for GAD   Occupation: Licensed conveyancer   Occupation: Retired    Comment: Former Art therapist  Tobacco Use   Smoking status: Never   Smokeless tobacco: Never  Scientific laboratory technician Use: Never used  Substance and Sexual Activity   Alcohol use: Not Currently   Drug use: Never   Sexual activity: Not Currently

## 2020-12-05 ENCOUNTER — Ambulatory Visit (INDEPENDENT_AMBULATORY_CARE_PROVIDER_SITE_OTHER): Payer: Medicare Other | Admitting: Psychiatry

## 2020-12-05 ENCOUNTER — Telehealth: Payer: Self-pay

## 2020-12-05 ENCOUNTER — Other Ambulatory Visit: Payer: Self-pay

## 2020-12-05 DIAGNOSIS — F4010 Social phobia, unspecified: Secondary | ICD-10-CM

## 2020-12-05 DIAGNOSIS — F411 Generalized anxiety disorder: Secondary | ICD-10-CM | POA: Diagnosis not present

## 2020-12-05 DIAGNOSIS — F341 Dysthymic disorder: Secondary | ICD-10-CM

## 2020-12-05 DIAGNOSIS — F330 Major depressive disorder, recurrent, mild: Secondary | ICD-10-CM

## 2020-12-05 DIAGNOSIS — R69 Illness, unspecified: Secondary | ICD-10-CM

## 2020-12-05 NOTE — Telephone Encounter (Signed)
Called and spoke to patient;  he reports having the following symptoms for the last 4-5 months.: 1) shaking in his Rt hand when picking up things such as a mug or a container with water in it.   2) trouble finding words to speak, feels like their in his head but unable to articulate them.   3)  running into things when he walks, feels like he is losing his balance.     Reports no HA's or vision issues.   They schedule him the first appointment available 12/29/20. Does he need to come in sooner to be evaluated?  Please review and advise.  Thanks.  Dm/cma

## 2020-12-05 NOTE — Progress Notes (Signed)
Psychotherapy Progress Note [facility] [name], PhD LP  Patient ID: [facility] [name])    MRN: [id number] Therapy format: Individual psychotherapy Date: 12/05/2020      Start: 10:18a     Stop: 11:06a     Time Spent: 48 min Location: In-person   Session narrative (presenting needs, interim history, self-report of stressors and symptoms, applications of prior therapy, status changes, and interventions made in session) Has been to attorney's office to rework POA and will, says he is challenged to find a contingent executor/POA and to write his own obituary.  Support/empathy provided.  Affirmed and encouraged.  Has not had the holiday conversation with sister about Thanksgiving plans and his wish to spend it alone (probably).  Recommended get ahead of it, prevent rude surprises.    Attention drawn to his priest's transgender child, suspected of being on hormones.    Concerned for minor word-finding and speech errors.  Possibility 150mg  Lamictal per day contributes, but has hx of abrupt withdrawal from longterm Klonopin, wonders if it did damage.   C/o vivid dreams on trazodone.  Interpreted likely combination of effects, cannot really say, but not too likely, it would be early Alzheimer's. Not likely it is damage from Klonopin withdrawal, either, although it would have precipitated panic.  Hx of Klonopin since 2004, daytime and qhs, d/c'd c. 2018 or 2019 caused insomnia, then consultation with physician indicated a taper in 5-day increments but it was entirely too jolting.  Remains on Klonopin TID.  C/o tremors at times, and minor ataxia, bumping into things at home.  Has to be extra careful in his church role managing steps while carrying things, has to be extra deliberate to prevent stumbling.    Discussed prospect of med adjustments and how Klonopin taper (with clonidine as an adjunct) would go now if undertaken.  Possibly could stand to reduce Lamictal.   Discussed nutrition for brain health.  Therapeutic modalities: Cognitive Behavioral Therapy, Solution-Oriented/Positive Psychology, and Ego-Supportive  Mental Status/Observations:  Appearance:   Casual and Neat     Behavior:  Appropriate  Motor:  Normal  Speech/Language:   Clear and Coherent  Affect:  Appropriate  Mood:  anxious and dysthymic  Thought process:  normal  Thought content:    WNL  Sensory/Perceptual disturbances:    WNL  Orientation:  Fully oriented  Attention:  Good    Concentration:  Fair  Memory:  WNL  Insight:    Good  Judgment:   Good  Impulse Control:  Fair   Risk Assessment: Danger to Self: No Self-injurious Behavior: No Danger to Others: No Physical Aggression / Violence: No Duty to Warn: No Access to Firearms a concern: No  Assessment of progress:  progressing  Diagnosis:   ICD-10-CM   1. Mild episode of recurrent major depressive disorder ([place])  F33.0     2. 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 Generalized anxiety disorder  F41.1     4. Social anxiety disorder  F40.10     5. r/o MCI, predementia vs. nutritional, stress, and medication effects  R69      Plan:  Apprise sister in timely manner of his choice not to go to Thanksgiving at her house, as practice in authority, authenticity, and assertiveness.  OK to say why if pressed. Recommend B complex and D supplementation, or at least  Will consult PCP ([name] at [facility]), needs physical anyway.  Recommend include B12 and vitamin D levels.  Other recommendations/advice as may be noted above Continue to utilize previously learned skills ad lib Maintain medication as prescribed and work faithfully with relevant prescriber(s) if any changes are desired or seem indicated Call the clinic on-call service, 988/hotline, present to ER, or call 911 if any life-threatening psychiatric crisis Return for session(s) already scheduled. Already scheduled visit in this office 12/10/2020.  Blanchie Serve,  PhD [name], PhD LP Clinical Psychologist, [facility] [address], [place], [place] [address] [phone]

## 2020-12-05 NOTE — Telephone Encounter (Signed)
Patient notified VIA phone.  No questions.  Dm/cma ? ?

## 2020-12-05 NOTE — Telephone Encounter (Signed)
Pt called to report he has been having trouble with his balance and "forgetting his words" for 6 to 7 months.  I scheduled him 11/28 (first available). Let me know if I need to get him in sooner

## 2020-12-09 ENCOUNTER — Other Ambulatory Visit: Payer: Self-pay | Admitting: Family Medicine

## 2020-12-09 DIAGNOSIS — M1611 Unilateral primary osteoarthritis, right hip: Secondary | ICD-10-CM

## 2020-12-10 ENCOUNTER — Encounter: Payer: Self-pay | Admitting: Physician Assistant

## 2020-12-10 ENCOUNTER — Ambulatory Visit (INDEPENDENT_AMBULATORY_CARE_PROVIDER_SITE_OTHER): Payer: Medicare Other | Admitting: Physician Assistant

## 2020-12-10 ENCOUNTER — Ambulatory Visit (INDEPENDENT_AMBULATORY_CARE_PROVIDER_SITE_OTHER): Payer: Medicare Other | Admitting: Psychiatry

## 2020-12-10 ENCOUNTER — Other Ambulatory Visit: Payer: Self-pay

## 2020-12-10 DIAGNOSIS — F330 Major depressive disorder, recurrent, mild: Secondary | ICD-10-CM

## 2020-12-10 DIAGNOSIS — F341 Dysthymic disorder: Secondary | ICD-10-CM | POA: Diagnosis not present

## 2020-12-10 DIAGNOSIS — F99 Mental disorder, not otherwise specified: Secondary | ICD-10-CM | POA: Diagnosis not present

## 2020-12-10 DIAGNOSIS — F4010 Social phobia, unspecified: Secondary | ICD-10-CM

## 2020-12-10 DIAGNOSIS — F331 Major depressive disorder, recurrent, moderate: Secondary | ICD-10-CM | POA: Diagnosis not present

## 2020-12-10 DIAGNOSIS — F411 Generalized anxiety disorder: Secondary | ICD-10-CM

## 2020-12-10 DIAGNOSIS — F419 Anxiety disorder, unspecified: Secondary | ICD-10-CM

## 2020-12-10 DIAGNOSIS — F5105 Insomnia due to other mental disorder: Secondary | ICD-10-CM

## 2020-12-10 MED ORDER — BUSPIRONE HCL 30 MG PO TABS: 30.0000 mg | ORAL_TABLET | Freq: Two times a day (BID) | ORAL | 1 refills | Status: DC

## 2020-12-10 NOTE — Progress Notes (Signed)
Psychotherapy Progress Note [facility] [name], PhD LP  Patient ID: Richard [facility] [name])    MRN: [id number] Therapy format: Individual psychotherapy Date: 12/10/2020      Start: 6:10p     Stop: 7:00p     Time Spent: 50 min Location: In-person   Session narrative (presenting needs, interim history, self-report of stressors and symptoms, applications of prior therapy, status changes, and interventions made in session) Urgently scheduled after med management visit this morning.  Understands his insurance may not cover same-day therapy visit.  Says he has been catastrophizing all week about a situation, couldn't get himself to calm down about it.  Sat or Nancy Fetter was at church and  [employer] chair casually referenced "When you're head of the [employer]..." and he started catastrophizing about being expected to take over, not being able to handle it, and not being free to decline, would have to leave the church in shame, etc.  Back story of getting in over his head for a [place] position in [place], developing panic attacks, felt so caught up in it he started feeling suicidal; though well-ment, his supervisor's insistence he could handle it only made him feel more trapped, and he tried to commit suicide by a bottle of ES Tylenol.  Has been dreading the prospect of current Chair [name]) becoming demanding if he tries to decline, and of feeling like he is responsible for letting everyone down, and of revealing his anxiety about the responsibility as some kind of socially deplorable weakness.  Assured otherwise, and challenged to recognize and reverse being as hard on himself as his father ever was.  Therapeutic modalities: Cognitive Behavioral Therapy, Solution-Oriented/Positive Psychology, and Ego-Supportive  Mental Status/Observations:  Appearance:   Casual and Neat     Behavior:  Appropriate  Motor:  Normal  Speech/Language:   Clear and Coherent  Affect:   Appropriate  Mood:  anxious  Thought process:  normal  Thought content:    Obsessions  Sensory/Perceptual disturbances:    WNL  Orientation:  Fully oriented  Attention:  Good    Concentration:  Good  Memory:  WNL  Insight:    Fair  Judgment:   Good  Impulse Control:  Good   Risk Assessment: Danger to Self: No Self-injurious Behavior: No Danger to Others: No Physical Aggression / Violence: No Duty to Warn: No Access to Firearms a concern: No  Assessment of progress:  situational setback(s)  Diagnosis:   ICD-10-CM   1. Social anxiety disorder  F40.10     2. Generalized anxiety disorder  F41.1     3. Mild episode of recurrent major depressive disorder (HCC)  F33.0     4. 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      Plan:  Resolve to call [name], check perception first whether she implied counting on him to take over [employer].  Practice permission to decline, and trust in his audience to be understanding, or at least for his "no/not at this time" to be valid.  Practice trust that he will not have to take any drastic exits like changing churches, and reframe that he deserves to find out the social and moral catastrophe of disappointing is much overrated. Other recommendations/advice as may be noted above Continue to utilize previously learned skills ad lib Maintain medication as prescribed and work faithfully with relevant prescriber(s) if any changes are desired or seem indicated Call the clinic on-call service, 988/hotline, present to ER, or call 911 if any life-threatening  psychiatric crisis Return for session(s) already scheduled. Already scheduled visit in this office 12/12/2020.  Blanchie Serve, PhD [name], PhD LP Clinical Psychologist, [facility] [address] [place], [place] [address] [phone]

## 2020-12-10 NOTE — Progress Notes (Signed)
[facility]  Patient ID: Richard Wilson,  MRN: [id number]  PCP: Haydee Salter, MD  Date of Evaluation: 12/10/2020  time spent:30 minutes  Chief Complaint:  Chief Complaint   Anxiety; Depression; Insomnia; Follow-up     HISTORY/CURRENT STATUS: HPI For routine med check, but not doing well.   Still is extremely anxious.  The Klonopin helps but he does not feel like it helps enough.  He still takes it 3 times a day, pretty routinely.  Not having panic attacks just more generalized, feels like something bad is going to happen if he does not take the Klonopin.  Patient denies loss of interest in usual activities and is able to enjoy things.  Denies decreased energy or motivation.  Appetite has not changed.  No extreme sadness or tearfulness, but sometimes he does have feelings of hopelessness.  Denies any changes in concentration, making decisions or remembering things.  Sleeps fairly well.  Denies suicidal or homicidal thoughts.  Patient denies increased energy with decreased need for sleep, no increased talkativeness, no racing thoughts, no impulsivity or risky behaviors, no increased spending, no increased libido, no grandiosity, no increased irritability or anger, and no hallucinations.  Denies dizziness, syncope, seizures, numbness, tingling, tremor, tics, unsteady gait, slurred speech, confusion. Denies muscle or joint pain, stiffness, or dystonia.  Individual Medical History/ Review of Systems: Changes? :No     Past medications for mental health diagnoses include: Rozerem, Trazodone caused vivid dreams, Xanax, Buspar, mirtazapine caused excessive somnolence and fatigue with flulike symptoms, Belsomra, propranolol for tremor, Zoloft, Abilify caused tremor, Klonopin, Effexor, Ativan, Wellbutrin, Nardil, lithium, gabapentin, Librium   Pertinent info after reviewing Dr. Evelena Leyden notes, his previous psychiatrist in [facility]. Past suicide attempt in  September 1996, by taking Tylenol.  He was admitted at that time to a psychiatric hospital in [facility].  Admitted to psych hospital multiple times in the past for "nervous breakdowns."  January 1985, February 1986, October 1991, September 1996, July 2000.  Allergies: Garamycin [gentamicin] and Penicillins  Current Medications:  Current Outpatient Medications:    aspirin EC 81 MG tablet, Take 81 mg by mouth daily. Swallow whole., Disp: , Rfl:    BioGaia Probiotic (BIOGAIA/GERBER SOOTHE) LIQD, Take 5 drops by mouth daily at 8 pm., Disp: , Rfl:    busPIRone (BUSPAR) 30 MG tablet, Take 1 tablet (30 mg total) by mouth 2 (two) times daily., Disp: 60 tablet, Rfl: 1   clonazePAM (KLONOPIN) 1 MG tablet, Take 1 tablet (1 mg total) by mouth 3 (three) times daily as needed for anxiety., Disp: 90 tablet, Rfl: 5   lamoTRIgine (LAMICTAL) 150 MG tablet, Take 1 tablet (150 mg total) by mouth daily., Disp: 90 tablet, Rfl: 0   psyllium (METAMUCIL) 58.6 % powder, Take 1 packet by mouth in the morning and at bedtime., Disp: , Rfl:    sertraline (ZOLOFT) 100 MG tablet, Take 2 tablets (200 mg total) by mouth daily., Disp: 180 tablet, Rfl: 0   traZODone (DESYREL) 150 MG tablet, Take by mouth., Disp: , Rfl:    naproxen (NAPROSYN) 500 MG tablet, TAKE 1 TABLET BY MOUTH 2 TIMES DAILY WITH A MEAL., Disp: 30 tablet, Rfl: 0 Medication Side Effects: sexual dysfunction  Family Medical/ Social History: Changes?  no  MENTAL HEALTH EXAM:  There were no vitals taken for this visit.There is no height or weight on file to calculate BMI.  General Appearance: Casual and Well Groomed  Eye Contact:  Good  Speech:  Clear and Coherent and Normal Rate  Volume:  Normal  Mood:  Anxious  Affect:  Restricted and Anxious  Thought Process:  Goal Directed and Descriptions of Associations: Intact  Orientation:  Full (Time, Place, and Person)  Thought Content: Logical   Suicidal Thoughts:  No  Homicidal Thoughts:  No   Memory:  WNL  Judgement:  Good  Insight:  Good  Psychomotor Activity:  Normal  Concentration:  Concentration: Good  Recall:  Good  Fund of Knowledge: Good  Language: Good  Assets:  Desire for Improvement  ADL's:  Intact  Cognition: WNL  Prognosis:  Good    DIAGNOSES:    ICD-10-CM   1. Severe anxiety  F41.9     2. Major depressive disorder, recurrent episode, moderate (HCC)  F33.1     3. Insomnia due to other mental disorder  F51.05    F99          Receiving Psychotherapy: Yes    Dr. Jonni Sanger Mitchum.  Therapy is going well.   RECOMMENDATIONS:  PDMP was reviewed.  Klonopin last filled 11/05/2020.  I provided 30  minutes of face to face time during this encounter, including time spent before and after the visit in records review, medical decision making, counseling pertinent to today's visit, and charting.  I recommend increasing the BuSpar to help with the generalized anxiety.  He is currently at 45 mg total per day so will increase the dose of the pill and he will take it twice a day.  He verbalizes understanding. He asks to see Dr. [name] today, fortunately he had an open appointment. Increase Buspar to 30 mg, 1 po bid.  Continue Klonopin 1 mg, 1 p.o. 3 times daily as needed. Continue Lamictal 150 mg, qd. Continue Zoloft 100 mg  2 p.o. daily. Continue trazodone 150 mg 1 p.o. nightly as needed. Continue counseling with Dr. [name]. Urgent appt at 6:00.  Return in 6 weeks.  Donnal Moat, PA-C

## 2020-12-11 ENCOUNTER — Other Ambulatory Visit: Payer: Self-pay | Admitting: Family Medicine

## 2020-12-11 DIAGNOSIS — M1611 Unilateral primary osteoarthritis, right hip: Secondary | ICD-10-CM

## 2020-12-11 NOTE — Telephone Encounter (Signed)
Pt called to follow up on this because he is almost out. Please advise

## 2020-12-11 NOTE — Telephone Encounter (Signed)
FYI: Spoke to patient, he did see Ortho and declined a Cortizone shot as well as Physical Therapy.  Was advised to keep the upcoming appointment on 12/29/20. Dm/cma

## 2020-12-12 ENCOUNTER — Other Ambulatory Visit: Payer: Self-pay

## 2020-12-12 ENCOUNTER — Ambulatory Visit (INDEPENDENT_AMBULATORY_CARE_PROVIDER_SITE_OTHER): Payer: Medicare Other | Admitting: Psychiatry

## 2020-12-12 DIAGNOSIS — F330 Major depressive disorder, recurrent, mild: Secondary | ICD-10-CM | POA: Diagnosis not present

## 2020-12-12 DIAGNOSIS — F411 Generalized anxiety disorder: Secondary | ICD-10-CM | POA: Diagnosis not present

## 2020-12-12 DIAGNOSIS — R69 Illness, unspecified: Secondary | ICD-10-CM

## 2020-12-12 DIAGNOSIS — F341 Dysthymic disorder: Secondary | ICD-10-CM

## 2020-12-12 DIAGNOSIS — F4010 Social phobia, unspecified: Secondary | ICD-10-CM

## 2020-12-12 NOTE — Progress Notes (Signed)
Psychotherapy Progress Note [facility] Richard Moore, PhD LP  Patient ID: Richard Wilson [facility] "Richard Wilson")    MRN: [id number] Therapy format: Individual psychotherapy Date: 12/12/2020      Start: 11:19a     Stop: 12:07p     Time Spent: 48 min Location: In-person   Session narrative (presenting needs, interim history, self-report of stressors and symptoms, applications of prior therapy, status changes, and interventions made in session) Did call Richard Wilson after session Wed, asked cogently and clearly about her plans, found out she does mean to retire from [employer] in about 2 months.  He was able to state that he cannot take the job, without having a catastrophic guilt attack.  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  Discussed at some length communication and expectations how to recruit, join with pastor for recruiting, and affirmed willingness to consider the role.  Explored ways to recruit using humor and sincerity, and some concern Richard Wilson might leave him high and dry.  Discussed assertiveness strategy for asking help and clarifying answers he thinks he might get.  Therapeutic modalities: Cognitive Behavioral Therapy, Solution-Oriented/Positive Psychology, and Assertiveness/Communication  Mental Status/Observations:  Appearance:   Casual and Neat     Behavior:  Appropriate  Motor:  Normal  Speech/Language:   Clear and Coherent  Affect:  Appropriate and brighter  Mood:  dysthymic  Thought process:  normal  Thought content:    WNL  Sensory/Perceptual disturbances:    WNL  Orientation:  Fully oriented  Attention:  Good    Concentration:  Good  Memory:  grossly intact  Insight:    Fair  Judgment:   Good  Impulse Control:  Good   Risk Assessment: Danger to Self: No Self-injurious Behavior: No Danger to Others: No Physical Aggression / Violence: No Duty to Warn: No Access to Firearms a concern:  No  Assessment of progress:  progressing  Diagnosis:   ICD-10-CM   1. Social anxiety disorder  F40.10     2. Generalized anxiety disorder  F41.1     3. Mild episode of recurrent major depressive disorder (HCC)  F33.0     4. 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 r/o MCI, predementia vs. nutritional, stress, and medication effects  R69      Plan:  Continue to practice assertive questions as needed  Endorse option to Wilson in church role  Brain-healthy nutrition as recommended last week --- B complex, D supplements most likely Other recommendations/advice as may be noted above Continue to utilize previously learned skills ad lib Maintain medication as prescribed and work faithfully with relevant prescriber(s) if any changes are desired or seem indicated Call the clinic on-call service, 988/hotline, 911, or present to [facility] or ER if any life-threatening psychiatric crisis Return for session(s) already scheduled. Already scheduled visit in this office 12/22/2020.  Richard Serve, PhD Richard Moore, PhD LP Clinical Psychologist, [facility] [address] [place], [place] [address] [phone]

## 2020-12-22 ENCOUNTER — Ambulatory Visit (INDEPENDENT_AMBULATORY_CARE_PROVIDER_SITE_OTHER): Payer: Medicare Other | Admitting: Psychiatry

## 2020-12-22 ENCOUNTER — Other Ambulatory Visit: Payer: Self-pay

## 2020-12-22 DIAGNOSIS — F4010 Social phobia, unspecified: Secondary | ICD-10-CM

## 2020-12-22 DIAGNOSIS — F411 Generalized anxiety disorder: Secondary | ICD-10-CM

## 2020-12-22 DIAGNOSIS — F330 Major depressive disorder, recurrent, mild: Secondary | ICD-10-CM | POA: Diagnosis not present

## 2020-12-22 DIAGNOSIS — F341 Dysthymic disorder: Secondary | ICD-10-CM | POA: Diagnosis not present

## 2020-12-22 DIAGNOSIS — R69 Illness, unspecified: Secondary | ICD-10-CM

## 2020-12-22 NOTE — Progress Notes (Addendum)
Psychotherapy Progress Note [facility] [name], PhD LP  Patient ID: Richard Wilson [employer] [name])    MRN: [id number] Therapy format: Individual psychotherapy Date: 12/22/2020      Start: 10:13a     Stop: 10:58a     Time Spent: 45 min Location: In-person   Session narrative (presenting needs, interim history, self-report of stressors and symptoms, applications of prior therapy, status changes, and interventions made in session) Woke up particularly tearful, depressed today.  Yesterday got saddled with sole responsibility for [place] not giving notice of her own.  Battling impulses to ruminate and resent being put upon.  Aware of feeling that lonely, too, and this morning encountered photos that exacerbated grief for mother, whom he still feels to be his last real companionship in life.  Reminisces about mother being uncomfortable with the [place] unworthy, but remained in a neighborhood that was changing demographically and economically.  Regrets not letting her tour the house before laving after 40 years.  Recalled coming out to parents.  This morning set himself for "retail therapy", about $60 worth.   Discussed habit of going in to church very early.  Initially self-blamed as compulsive, but redeemed in session as stress management, actually, giving himself the gift of adequate time to do things right as he sees fit.  Possibly also some chance for devotional time.    Has set himself not to go to sister's for Thanksgiving, not yet informed her.  Encouraged to go ahead and clarify ASAP, as notice is always more respectful than surprises.  Therapeutic modalities: Cognitive Behavioral Therapy, Solution-Oriented/Positive Psychology, and Ego-Supportive  Mental Status/Observations:  Appearance:   Casual and Neat     Behavior:  Appropriate  Motor:  Normal  Speech/Language:   Clear and Coherent  Affect:  Appropriate  Mood:  anxious and  dysthymic  Thought process:  normal  Thought content:    WNL and Rumination  Sensory/Perceptual disturbances:    WNL  Orientation:  Fully oriented  Attention:  Good    Concentration:  Fair  Memory:  WNL  Insight:    Fair  Judgment:   Good  Impulse Control:  Fair   Risk Assessment: Danger to Self: No Self-injurious Behavior: No Danger to Others: No Physical Aggression / Violence: No Duty to Warn: No Access to Firearms a concern: No  Assessment of progress:  stabilized  Diagnosis:   ICD-10-CM   1. Social anxiety disorder  F40.10     2. Generalized anxiety disorder  F41.1     3. Mild episode of recurrent major depressive disorder (HCC)  F33.0     4. 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 r/o MCI, predementia vs. 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 Re. porn viewing, continue to frame as value-based decision -- judge it by its fruits: if soothing and no harm, no shame, if it helps objectify other people or contribute to social isolation, turn from it Other recommendations/advice as may be noted above Continue to utilize previously learned skills ad lib Maintain medication as prescribed and work faithfully with relevant prescriber(s) if any changes are desired  or seem indicated Call the clinic on-call service, 988/hotline, 911, or present to [facility] or ER if any life-threatening psychiatric crisis Return for session(s) already scheduled. Already scheduled visit in this office 01/02/2021.  Blanchie Serve, PhD [name], PhD LP Clinical Psychologist, [facility] [address] [place], [place] [address] [phone]

## 2020-12-29 ENCOUNTER — Encounter: Payer: Self-pay | Admitting: Family Medicine

## 2020-12-29 ENCOUNTER — Ambulatory Visit (INDEPENDENT_AMBULATORY_CARE_PROVIDER_SITE_OTHER): Payer: Medicare Other | Admitting: Family Medicine

## 2020-12-29 ENCOUNTER — Other Ambulatory Visit: Payer: Self-pay

## 2020-12-29 ENCOUNTER — Other Ambulatory Visit: Payer: Self-pay | Admitting: Physician Assistant

## 2020-12-29 ENCOUNTER — Telehealth: Payer: Self-pay | Admitting: Physician Assistant

## 2020-12-29 VITALS — BP 126/70 | HR 69 | Temp 97.8°F | Ht 69.0 in | Wt 169.6 lb

## 2020-12-29 DIAGNOSIS — M25551 Pain in right hip: Secondary | ICD-10-CM | POA: Diagnosis not present

## 2020-12-29 DIAGNOSIS — R488 Other symbolic dysfunctions: Secondary | ICD-10-CM

## 2020-12-29 DIAGNOSIS — R251 Tremor, unspecified: Secondary | ICD-10-CM | POA: Diagnosis not present

## 2020-12-29 DIAGNOSIS — G8929 Other chronic pain: Secondary | ICD-10-CM

## 2020-12-29 DIAGNOSIS — G252 Other specified forms of tremor: Secondary | ICD-10-CM | POA: Insufficient documentation

## 2020-12-29 MED ORDER — TRAZODONE HCL 150 MG PO TABS: 150.0000 mg | ORAL_TABLET | Freq: Every day | ORAL | 0 refills | Status: DC

## 2020-12-29 MED ORDER — LAMOTRIGINE 150 MG PO TABS
150.0000 mg | ORAL_TABLET | Freq: Every day | ORAL | 0 refills | Status: DC
Start: 2020-12-29 — End: 2021-01-21

## 2020-12-29 NOTE — Telephone Encounter (Signed)
Richard Wilson is requesting a refill on his Trazodone and Lamotrigine called to:  [id number] [address], [place]  Phone:  [phone]  Fax:  [phone]    Pharmacy never sent [place] a request for the refills after he requested it last week.

## 2020-12-29 NOTE — Telephone Encounter (Signed)
Rx sent 

## 2020-12-29 NOTE — Progress Notes (Signed)
[facility] [address] [place] [address] Dept: [phone] Dept Fax: [phone]  Office Visit  Subjective:    Patient ID: Richard Wilson, male    DOB: 01/23/1951, 70 y.o..   MRN: [id number]  Chief Complaint  Patient presents with   Acute Visit    C/o having RT hip pain and having trouble finding the right words when speaking.      History of Present Illness:  Patient is in today complaining of an issue with word selection. He notes that for the past year, he has had issues with either switching words that have similar consonants or with being able to recall or select appropriate words. He finds if he pauses and thinks, he can do this, but this slows down the flow of his speech. He associates this with a prior [facility] provider that stopped his use of Klonipin. She tapered him down over a period of 5 weeks. Once off, [name] notes he felt very on edge. His psychiatrist restarted him on the medication. [name] believes that this may have actually done something physically to him that has caused his issues. He also notes a tremor that he associates with this. The tremor occurs when lifting something heavy, such as pouring from a full pitcher. It also can occur when trying to light a tall candle with a candle lighting stick. [name] is on multiple psychoactive medications.  [name] had recently seen Dr. Erlinda Hong (orthopedics) about his right hip pain. He has noted some improvement with the use of naproxen. He was offered an MRI or a steroid injection, but had chosen to delay this to see if home exercises and NSAIDs would work.  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   Thrombocytopenia ([place]) 04/08/2020   Generalized anxiety disorder 01/01/2020   Mild episode of recurrent major depressive disorder ([place]) 01/01/2020   Primary osteoarthritis of both hips 07/30/2019   Chronic kidney disease, stage 3a ([place]) 03/19/2019   Pure hypercholesterolemia 12/15/2018   Arthritis of knee 08/31/2017   Nephrolithiasis 06/22/2016   Obstructive sleep apnea syndrome 04/13/2015   Diverticulosis of large intestine without hemorrhage 09/30/2013   Mild mitral regurgitation 76/28/3151   Diastolic dysfunction 76/16/0737   Elevated PSA 12/22/2012   RBBB (right bundle branch block) 12/03/2012   Past Surgical History:  Procedure Laterality Date   APPENDECTOMY     FOOT TENDON SURGERY Right    TONSILLECTOMY     Family History  Problem Relation Age of Onset   Cancer Mother        Ovarian   Heart disease Mother    Prostate cancer Father    Cancer Maternal Aunt        Breast, metastatic   Throat cancer Maternal Grandfather    Heart attack Paternal Grandfather    Outpatient Medications Prior to Visit  Medication Sig Dispense Refill   aspirin EC 81 MG tablet Take 81 mg by mouth daily. Swallow whole.     BioGaia Probiotic (BIOGAIA/GERBER SOOTHE) LIQD Take 5 drops by mouth daily at 8 pm.     busPIRone (BUSPAR) 30 MG tablet Take 1 tablet (30 mg total) by mouth 2 (two) times daily. 60 tablet 1   clonazePAM (KLONOPIN) 1  MG tablet Take 1 tablet (1 mg total) by mouth 3 (three) times daily as needed for anxiety. 90 tablet 5   lamoTRIgine (LAMICTAL) 150 MG tablet Take 1 tablet (150 mg total) by mouth daily. 30 tablet 0   naproxen (NAPROSYN) 500 MG tablet TAKE 1 TABLET BY MOUTH 2 TIMES DAILY WITH A MEAL. 30 tablet 0   psyllium (METAMUCIL) 58.6 % powder Take 1 packet by mouth in the morning and at bedtime.     sertraline (ZOLOFT) 100 MG tablet Take 2 tablets (200 mg total) by mouth daily. 180 tablet 0   traZODone (DESYREL) 150 MG tablet Take 1 tablet (150 mg total) by mouth  at bedtime. 30 tablet 0   No facility-administered medications prior to visit.   Allergies  Allergen Reactions   Garamycin [Gentamicin] Other (See Comments)    Eyes itched and were edematous   Penicillins     Pt reports is childhood allergy and does not remember the reaction   Objective:   Today's Vitals   12/29/20 1609  BP: 126/70  Pulse: 69  Temp: 97.8 F (36.6 C)  TempSrc: Temporal  SpO2: 93%  Weight: 169 lb 9.6 oz (76.9 kg)  Height: 5\' 9"  (1.753 m)   Body mass index is 25.05 kg/m.   General: Well developed, well nourished. No acute distress. Neuro: No visible resting tremor or with intention. Psych: Alert and oriented. Normal mood and affect.  Health Maintenance Due  Topic Date Due   Hepatitis C Screening  Never done   Zoster Vaccines- Shingrix (1 of 2) Never done   Pneumonia Vaccine 38+ Years old (2 - PCV) 03/24/2021     Assessment & Plan:   1. Chronic pain of right hip Reviewed consult note from Dr. Erlinda Hong. [name] finds his hip improving and tolerable. We will monitor this and follow-up with Dr. Erlinda Hong for next steps if needed.  2. Anomia 3. Tremor, unspecified I will start with a referral to neurology. We did discuss that some of all of these symptoms could be related to his multiple psychoactive medications. I am not sure what to make of his concern about his 2 week time off of Klonopin as a trigger for theses issues.  - Ambulatory referral to Neurology  Haydee Salter, MD

## 2021-01-02 ENCOUNTER — Other Ambulatory Visit: Payer: Self-pay | Admitting: Physician Assistant

## 2021-01-02 ENCOUNTER — Other Ambulatory Visit: Payer: Self-pay

## 2021-01-02 ENCOUNTER — Ambulatory Visit (INDEPENDENT_AMBULATORY_CARE_PROVIDER_SITE_OTHER): Payer: Medicare Other | Admitting: Psychiatry

## 2021-01-02 DIAGNOSIS — F341 Dysthymic disorder: Secondary | ICD-10-CM

## 2021-01-02 DIAGNOSIS — F411 Generalized anxiety disorder: Secondary | ICD-10-CM | POA: Diagnosis not present

## 2021-01-02 DIAGNOSIS — F4010 Social phobia, unspecified: Secondary | ICD-10-CM | POA: Diagnosis not present

## 2021-01-02 DIAGNOSIS — F330 Major depressive disorder, recurrent, mild: Secondary | ICD-10-CM | POA: Diagnosis not present

## 2021-01-02 DIAGNOSIS — R69 Illness, unspecified: Secondary | ICD-10-CM

## 2021-01-02 NOTE — Progress Notes (Signed)
Psychotherapy Progress Note [facility] [name], PhD LP  Patient ID: Richard Wilson [facility] [name])    MRN: [id number] Therapy format: Individual psychotherapy Date: 01/02/2021      Start: 10:13a     Stop: 11:00A     Time Spent: 47 min Location: In-person   Session narrative (presenting needs, interim history, self-report of stressors and symptoms, applications of prior therapy, status changes, and interventions made in session) Noted to be wearing stockings under trousers, presumably a nonverbal experiment in being freer to cross-dress.  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  Anticipates some burdening by help calling off, but in today's telling, relishing the possibilities of sitting in the sacristy Christmas Eve with a hip flask, various other fantasies of bitching or acting up in protest.  Discussed lighter-hearted approaches to making sure he is unconventional and not pigeonholed into a role.  Last week's dreaded holiday went well.  Elected not to go to sister's, and she was very understanding of it.    Increasingly concerned with word-finding and memory issues he attributes possibly to abrupt Klonopin withdrawal some time ago.  Unlikely that did damage, but OK to evaluate with neurology/neuropsych.  Still most likely combination of lifestyle, med factors, and age, including suspected B12 deficiency, possible sleep degradation, and effects of Klonopin and possibly Lamictal.  Therapeutic modalities: Cognitive Behavioral Therapy, Solution-Oriented/Positive Psychology, and Ego-Supportive  Mental Status/Observations:  Appearance:   Casual and Neat     Behavior:  Appropriate and Attention-Seeking  Motor:  Normal  Speech/Language:   Clear and Coherent  Affect:  Appropriate and dramatizing  Mood:  anxious and dysthymic   Thought process:  normal  Thought content:    Rumination  Sensory/Perceptual disturbances:    WNL  Orientation:  Fully oriented  Attention:  Good    Concentration:  Good  Memory:  WNL  Insight:    Fair  Judgment:   Good  Impulse Control:  Fair   Risk Assessment: Danger to Self: No Self-injurious Behavior: No Danger to Others: No Physical Aggression / Violence: No Duty to Warn: No Access to Firearms a concern: No  Assessment of progress:  stabilized  Diagnosis:   ICD-10-CM   1. Generalized anxiety disorder  F41.1     2. Mild episode of recurrent major depressive disorder (HCC)  F33.0     3. Social anxiety disorder  F40.10     4. 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 r/o MCI, predementia vs. nutritional, stress, and medication effects  R69      Plan:  Options to employ non-harmful, unconventional ideas how to be at upcoming holiday services.  Encourage to serve as pledged, however, and tamp down fantasizing about acting up, put the energy into clarifying what to expect and asking assertively where needed. Endorse neurological evaluation as  Other recommendations/advice as may be noted above Continue to utilize previously learned skills ad lib Maintain medication as prescribed and work faithfully with relevant prescriber(s) if any changes are desired or seem indicated Call the clinic on-call service, 988/hotline, 911, or present to [facility] or ER if any life-threatening psychiatric crisis No follow-ups on file. Already scheduled visit in this office 01/19/2021.  Blanchie Serve, PhD [name], PhD LP Clinical Psychologist, [facility] [address] [place], [place] [address] [phone]

## 2021-01-05 ENCOUNTER — Inpatient Hospital Stay: Payer: Medicare Other | Attending: Hematology and Oncology

## 2021-01-05 ENCOUNTER — Encounter: Payer: Self-pay | Admitting: Hematology and Oncology

## 2021-01-05 ENCOUNTER — Other Ambulatory Visit: Payer: Self-pay

## 2021-01-05 ENCOUNTER — Inpatient Hospital Stay (HOSPITAL_BASED_OUTPATIENT_CLINIC_OR_DEPARTMENT_OTHER): Payer: Medicare Other | Admitting: Hematology and Oncology

## 2021-01-05 DIAGNOSIS — D696 Thrombocytopenia, unspecified: Secondary | ICD-10-CM

## 2021-01-05 DIAGNOSIS — E65 Localized adiposity: Secondary | ICD-10-CM | POA: Diagnosis not present

## 2021-01-05 DIAGNOSIS — E668 Other obesity: Secondary | ICD-10-CM | POA: Diagnosis not present

## 2021-01-05 DIAGNOSIS — K760 Fatty (change of) liver, not elsewhere classified: Secondary | ICD-10-CM | POA: Diagnosis not present

## 2021-01-05 DIAGNOSIS — D751 Secondary polycythemia: Secondary | ICD-10-CM

## 2021-01-05 DIAGNOSIS — D45 Polycythemia vera: Secondary | ICD-10-CM

## 2021-01-05 LAB — CBC WITH DIFFERENTIAL/PLATELET
Abs Immature Granulocytes: 0.01 10*3/uL (ref 0.00–0.07)
Basophils Absolute: 0 10*3/uL (ref 0.0–0.1)
Basophils Relative: 1 %
Eosinophils Absolute: 0.2 10*3/uL (ref 0.0–0.5)
Eosinophils Relative: 5 %
HCT: 52.4 % — ABNORMAL HIGH (ref 39.0–52.0)
Hemoglobin: 17.5 g/dL — ABNORMAL HIGH (ref 13.0–17.0)
Immature Granulocytes: 0 %
Lymphocytes Relative: 20 %
Lymphs Abs: 0.9 10*3/uL (ref 0.7–4.0)
MCH: 29.7 pg (ref 26.0–34.0)
MCHC: 33.4 g/dL (ref 30.0–36.0)
MCV: 89 fL (ref 80.0–100.0)
Monocytes Absolute: 0.5 10*3/uL (ref 0.1–1.0)
Monocytes Relative: 10 %
Neutro Abs: 3 10*3/uL (ref 1.7–7.7)
Neutrophils Relative %: 64 %
Platelets: 146 10*3/uL — ABNORMAL LOW (ref 150–400)
RBC: 5.89 MIL/uL — ABNORMAL HIGH (ref 4.22–5.81)
RDW: 13.3 % (ref 11.5–15.5)
WBC: 4.7 10*3/uL (ref 4.0–10.5)
nRBC: 0 % (ref 0.0–0.2)

## 2021-01-05 NOTE — Progress Notes (Signed)
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 Secondary erythrocytosis from other causes such as undiagnosed sleep apnea could be a potential cause He does not need phlebotomy today I suspect there is an element of dehydration as the patient has not eaten anything I recommend him to continue taking 81 mg aspirin daily I will see him again in 6 months for further follow-up  Thrombocytopenia ([place]) This is likely due to fatty liver change His platelet count is improving with recent weight loss Monitor closely  Central obesity The patient has lost a lot of weight since last time I saw him through dietary modification and intentional weight loss His BMI is now normal although he has persistent central obesity I congratulated the patient's achievement and continue to encourage him to work on lifestyle changes  No orders of the defined types were placed in this encounter.   The total time spent in the appointment was 20 minutes encounter with patients including review of chart and various tests results, discussions about plan of care and coordination of care plan   All questions were answered. The patient knows to call the clinic with any problems, questions or concerns. No barriers to learning was detected.    [name], MD 12/5/202210:44 AM  INTERVAL HISTORY: Richard Wilson 70 y.o. male returns for follow-up on secondary erythrocytosis and thrombocytopenia He is doing well He has lost some weight since last time I saw him  SUMMARY OF HEMATOLOGIC HISTORY: Oncology History  Secondary erythrocytosis  04/25/2020 Bone Marrow Biopsy   BONE MARROW, ASPIRATE, CLOT, CORE:  -  Mildly hypercellular bone marrow with trilineage hematopoiesis with adequate maturation  -  See comment   PERIPHERAL BLOOD:  -  Erythrocytosis  -  Minimal thrombocytopenia  -   See CBC data   COMMENT:   The bone marrow shows trilineage hematopoiesis with a full spectrum of maturation, without significant dysplasia or increase in blasts.  There is no definitive morphologic evidence of involvement by a myeloid  neoplasm.  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  Correlation with clinical impressions, other laboratory data (eg. CBC trends), and pending cytogenetic results is recommended.  Cytogenetics are normal      I have reviewed the past medical history, past surgical history, social history and family history with the patient and they are unchanged from previous note.  ALLERGIES:  is allergic to garamycin [gentamicin] and penicillins.  MEDICATIONS:  Current Outpatient Medications  Medication Sig Dispense Refill   aspirin EC 81 MG tablet Take 81 mg by mouth daily. Swallow whole.     BioGaia Probiotic (BIOGAIA/GERBER SOOTHE) LIQD Take 5 drops by mouth daily at 8 pm.     busPIRone (BUSPAR) 30 MG tablet Take 1 tablet (30 mg total) by mouth 2 (two) times daily. 60 tablet 1   clonazePAM (KLONOPIN) 1 MG tablet Take 1 tablet (1 mg total) by mouth 3 (three) times daily as needed for anxiety. 90 tablet 5   lamoTRIgine (LAMICTAL) 150 MG tablet Take 1 tablet (150 mg total) by mouth daily. 30 tablet 0   naproxen (NAPROSYN) 500 MG tablet TAKE 1 TABLET BY MOUTH 2 TIMES DAILY WITH A MEAL.  30 tablet 0   psyllium (METAMUCIL) 58.6 % powder Take 1 packet by mouth in the morning and at bedtime.     sertraline (ZOLOFT) 100 MG tablet Take 2 tablets (200 mg total) by mouth daily. 180 tablet 0   traZODone (DESYREL) 150 MG tablet Take 1 tablet (150 mg total) by mouth at bedtime. 30 tablet 0   No current facility-administered medications for this  visit.     REVIEW OF SYSTEMS:   Constitutional: Denies fevers, chills or night sweats Eyes: Denies blurriness of vision Ears, nose, mouth, throat, and face: Denies mucositis or sore throat Respiratory: Denies cough, dyspnea or wheezes Cardiovascular: Denies palpitation, chest discomfort or lower extremity swelling Gastrointestinal:  Denies nausea, heartburn or change in bowel habits Skin: Denies abnormal skin rashes Lymphatics: Denies new lymphadenopathy or easy bruising Neurological:Denies numbness, tingling or new weaknesses Behavioral/Psych: Mood is stable, no new changes  All other systems were reviewed with the patient and are negative.  PHYSICAL EXAMINATION: ECOG PERFORMANCE STATUS: 0 - Asymptomatic  Vitals:   01/05/21 1015  BP: 114/71  Pulse: 81  Resp: 17  Temp: (!) 97.3 F (36.3 C)  SpO2: 98%   Filed Weights   01/05/21 1015  Weight: 165 lb 12.8 oz (75.2 kg)    GENERAL:alert, no distress and comfortable NEURO: alert & oriented x 3 with fluent speech, no focal motor/sensory deficits  LABORATORY DATA:  I have reviewed the data as listed     Component Value Date/Time   NA 139 09/24/2020 1030   K 4.7 09/24/2020 1030   CL 104 09/24/2020 1030   CO2 24 09/24/2020 1030   GLUCOSE 134 (H) 09/24/2020 1030   BUN 18 09/24/2020 1030   CREATININE 1.45 09/24/2020 1030   CALCIUM 9.5 09/24/2020 1030    No results found for: SPEP, UPEP  Lab Results  Component Value Date   WBC 4.7 01/05/2021   NEUTROABS 3.0 01/05/2021   HGB 17.5 (H) 01/05/2021   HCT 52.4 (H) 01/05/2021   MCV 89.0 01/05/2021   PLT 146 (L) 01/05/2021      Chemistry      Component Value Date/Time   NA 139 09/24/2020 1030   K 4.7 09/24/2020 1030   CL 104 09/24/2020 1030   CO2 24 09/24/2020 1030   BUN 18 09/24/2020 1030   CREATININE 1.45 09/24/2020 1030      Component Value Date/Time   CALCIUM 9.5 09/24/2020 1030

## 2021-01-05 NOTE — Assessment & Plan Note (Signed)
His bone marrow biopsy is not consistent with polycythemia vera Secondary erythrocytosis from other causes such as undiagnosed sleep apnea could be a potential cause He does not need phlebotomy today I suspect there is an element of dehydration as the patient has not eaten anything I recommend him to continue taking 81 mg aspirin daily I will see him again in 6 months for further follow-up

## 2021-01-05 NOTE — Assessment & Plan Note (Signed)
The patient has lost a lot of weight since last time I saw him through dietary modification and intentional weight loss His BMI is now normal although he has persistent central obesity I congratulated the patient's achievement and continue to encourage him to work on lifestyle changes

## 2021-01-05 NOTE — Assessment & Plan Note (Signed)
This is likely due to fatty liver change His platelet count is improving with recent weight loss Monitor closely

## 2021-01-05 NOTE — Progress Notes (Signed)
Assessment/Plan:   1.  Parkinsonism, neuroleptic induced  -Resolved off of Abilify.  Reassurance provided  2.  Tremor  -Patient complains of tremor, but I do not really see any tremor like I did in the past.  Per PCP hx, pt had wondered if it was because his clonazepam was stopped after a 5-week taper (he didn't relay that to me).  I really do not see any time when he did not pick up his clonazepam via PDMP.  I worry that he is getting habituation from high-dose clonazepam and gets tremor as it wears off.  We discussed that today.  -He does have some entrainment with tremor in the right hand.   3.  Memory change/word trouble  -suspect due to meds  -he does have a b12 deficiency (or did in aug) and recommended he start oral B12 supplementation, 1000 mcg daily.  -We will schedule neurocognitive testing.   Subjective:   Richard Wilson was seen today in follow up for what was felt to be neuroleptic induced parkinsonism (Abilify).  My previous records were reviewed prior to todays visit as well as outside records available to me.  Patient currently off of Abilify.  Pharmacy indicates that he last picked this up for a 90-day supply in May.  Last seen in our office about 1 year ago.  Patient saw primary care on November 8 and complained about tremor, but primary care thought that it was related to his "multiple psychoactive medications."  Patient thought that it was due to the fact that one of his behavioral health clinician stopped his clonazepam, after a 5-week taper.  However, because of his complaints, it was restarted.  Patient is on a fairly large dose of 1 mg 3 times per day.  PDMP is reviewed.  I do not see any time that he did not pick up his clonazepam monthly between February and current date (last picked up November 14).  Pt states that when he picks up a pitcher of tea or tries to pour something, he will have tremor, mostly in the R hand.  Also c/o word finding trouble or mixes up one  word for another.      ALLERGIES:   Allergies  Allergen Reactions   Garamycin [Gentamicin] Other (See Comments)    Eyes itched and were edematous   Penicillins     Pt reports is childhood allergy and does not remember the reaction    CURRENT MEDICATIONS:  Outpatient Encounter Medications as of 01/08/2021  Medication Sig   aspirin EC 81 MG tablet Take 81 mg by mouth daily. Swallow whole.   BioGaia Probiotic (BIOGAIA/GERBER SOOTHE) LIQD Take 5 drops by mouth daily at 8 pm.   busPIRone (BUSPAR) 30 MG tablet Take 1 tablet (30 mg total) by mouth 2 (two) times daily.   clonazePAM (KLONOPIN) 1 MG tablet Take 1 tablet (1 mg total) by mouth 3 (three) times daily as needed for anxiety.   lamoTRIgine (LAMICTAL) 150 MG tablet Take 1 tablet (150 mg total) by mouth daily.   naproxen (NAPROSYN) 500 MG tablet TAKE 1 TABLET BY MOUTH 2 TIMES DAILY WITH A MEAL.   psyllium (METAMUCIL) 58.6 % powder Take 1 packet by mouth in the morning and at bedtime.   sertraline (ZOLOFT) 100 MG tablet Take 2 tablets (200 mg total) by mouth daily.   traZODone (DESYREL) 150 MG tablet Take 1 tablet (150 mg total) by mouth at bedtime.   No facility-administered encounter medications on file as  of 01/08/2021.    Objective:   PHYSICAL EXAMINATION:    VITALS:   Vitals:   01/08/21 1432  BP: 106/74  Pulse: 88  SpO2: 96%  Weight: 167 lb 12.8 oz (76.1 kg)  Height: 5\' 9"  (1.753 m)    GEN:  The patient appears stated age and is in NAD. HEENT:  Normocephalic, atraumatic.  The mucous membranes are moist. The superficial temporal arteries are without ropiness or tenderness. CV:  RRR Lungs:  CTAB Neck/HEME:  There are no carotid bruits bilaterally.  Neurological examination:  Orientation: The patient is alert and oriented x3. Cranial nerves: There is good facial symmetry without facial hypomimia. The speech is fluent and clear. Soft palate rises symmetrically and there is no tongue deviation. Hearing is intact to  conversational tone. Sensation: Sensation is intact to light touch throughout Motor: Strength is at least antigravity x4.  Movement examination: Tone: There is nl tone in the bilateral upper extremities.  The tone in the lower extremities is nl.  Abnormal movements: there is no rest tremor.  There is no postural tremor.  There is no intention tremor.  There is a tremor on the R hand when pouring water (some entrainment).  he has no difficulty with archimedes spirals bilaterally. Coordination:  There is no decremation with RAM's, with any form of RAMS, including alternating supination and pronation of the forearm, hand opening and closing, finger taps, heel taps and toe taps bilaterally Gait and Station: The patient has no difficulty arising out of a deep-seated chair without the use of the hands. The patient's stride length is good.     I have reviewed and interpreted the following labs independently    Chemistry      Component Value Date/Time   NA 139 09/24/2020 1030   K 4.7 09/24/2020 1030   CL 104 09/24/2020 1030   CO2 24 09/24/2020 1030   BUN 18 09/24/2020 1030   CREATININE 1.45 09/24/2020 1030      Component Value Date/Time   CALCIUM 9.5 09/24/2020 1030       Lab Results  Component Value Date   WBC 4.7 01/05/2021   HGB 17.5 (H) 01/05/2021   HCT 52.4 (H) 01/05/2021   MCV 89.0 01/05/2021   PLT 146 (L) 01/05/2021    Lab Results  Component Value Date   TSH 1.81 09/24/2020   Lab Results  Component Value Date   VITAMINB12 294 09/24/2020     Total time spent on today's visit was 30 minutes, including both face-to-face time and nonface-to-face time.  Time included that spent on review of records (prior notes available to me/labs/imaging if pertinent), discussing treatment and goals, answering patient's questions and coordinating care.  Cc:  Haydee Salter, MD

## 2021-01-08 ENCOUNTER — Ambulatory Visit (INDEPENDENT_AMBULATORY_CARE_PROVIDER_SITE_OTHER): Payer: Medicare Other | Admitting: Neurology

## 2021-01-08 ENCOUNTER — Other Ambulatory Visit: Payer: Self-pay

## 2021-01-08 ENCOUNTER — Encounter: Payer: Self-pay | Admitting: Neurology

## 2021-01-08 VITALS — BP 106/74 | HR 88 | Ht 69.0 in | Wt 167.8 lb

## 2021-01-08 DIAGNOSIS — E538 Deficiency of other specified B group vitamins: Secondary | ICD-10-CM

## 2021-01-08 DIAGNOSIS — G251 Drug-induced tremor: Secondary | ICD-10-CM | POA: Diagnosis not present

## 2021-01-08 DIAGNOSIS — R413 Other amnesia: Secondary | ICD-10-CM

## 2021-01-08 NOTE — Patient Instructions (Addendum)
You have been diagnosed with low B12.  We recommend that you take over the counter B12, 1000 micrograms daily.   You have been referred for a neurocognitive evaluation (i.e., evaluation of memory and thinking abilities). Please bring someone with you to this appointment if possible, as it is helpful for the neuropsychologist to hear from both you and another adult who knows you well. Please bring eyeglasses and hearing aids if you wear them and take any medications as you normally would.    The evaluation will take approximately 2-3 hours and has two parts:   The first part is a clinical interview with the neuropsychologist, Dr. Melvyn Novas.  During the interview, the neuropsychologist will speak with you and the individual you brought to the appointment.    The second part of the evaluation is testing with the doctor's technician, aka psychometrician, Hinton Dyer or [employer]. During the testing, the technician will ask you to remember different types of material, solve problems, and answer some questionnaires. Your family member will not be present for this portion of the evaluation.   Please note: We have to reserve several hours of the neuropsychologist's time and the psychometrician's time for your evaluation appointment. As such, there is a No-Show fee of $100. If you are unable to attend any of your appointments, please contact our office as soon as possible to reschedule.

## 2021-01-15 ENCOUNTER — Ambulatory Visit (INDEPENDENT_AMBULATORY_CARE_PROVIDER_SITE_OTHER): Payer: Medicare Other | Admitting: Psychiatry

## 2021-01-15 ENCOUNTER — Other Ambulatory Visit: Payer: Self-pay

## 2021-01-15 DIAGNOSIS — F330 Major depressive disorder, recurrent, mild: Secondary | ICD-10-CM

## 2021-01-15 DIAGNOSIS — F4010 Social phobia, unspecified: Secondary | ICD-10-CM

## 2021-01-15 DIAGNOSIS — F341 Dysthymic disorder: Secondary | ICD-10-CM | POA: Diagnosis not present

## 2021-01-15 DIAGNOSIS — F411 Generalized anxiety disorder: Secondary | ICD-10-CM

## 2021-01-15 NOTE — Progress Notes (Signed)
Psychotherapy Progress Note [facility] 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  All indications that Richard Wilson will be withdrawing from service now that she has had surgery.  Already getting involved inventorying resources and getting on top of whatever he may be responsible for.  Tempting fantasy of setting up cream sherry in the water glasses officiants will use Christmas Eve (at his own expense, ethically), but most likely not going to do that.  Has found some freedom in imagining irreverent ways to do the role, or recruit lay help.  Does seem to have a good working communication now with pastor, willingness to ask questions, not just catastrophize.  Discussed ways of coping with forecast tensions, including imagining a beloved former priest Richard Wilson), who was very human, approachable, and fond of pointing out God in each other's actions.  Finding his sense of play in things like blinging up a navy blue sweater for Christmas Eve service with a lot of sparkly accessories.  Encouraged to get questions answered more than dream up fantasies of rebellion, and affirm his own prerogative to Wilson or decline the role, not just let it become an imagined obligation.  As family go, sister and family have been ill lately, spiking concern and making holiday plans less pressing.  Affirmed prerogative to decide Christmas plans.  Got his will finalized this week.  Expensive, but a relief.  Meanwhile PCP has sent him to a neurologist, who determined  need for a test (apparently neuropsych), but did convey that his tremor is clearly not Parkinsonian.  Long delay to get the testing done, impatient to see it done so he can move on to whatever treatment is appropriate and hopefully tam down the tremor.  Discussed likelihood of beta blocker, possible PT for it.  Therapeutic modalities: Cognitive Behavioral Therapy, Solution-Oriented/Positive Psychology, Ego-Supportive, Humanistic/Existential, and Faith-sensitive  Mental Status/Observations:  Appearance:   Casual     Behavior:  Appropriate  Motor:  Normal  Speech/Language:   Clear and Coherent  Affect:  Appropriate  Mood:  dysthymic, more responsive  Thought process:  normal  Thought content:    WNL  Sensory/Perceptual disturbances:    WNL  Orientation:  Fully oriented  Attention:  Good    Concentration:  Good  Memory:  WNL  Insight:    Good  Judgment:   Good  Impulse Control:  Good   Risk Assessment: Danger to Self: No Self-injurious Behavior: No Danger to Others: No Physical Aggression / Violence: No Duty to Warn: No Access to Firearms a concern: No  Assessment of progress:  progressing  Diagnosis:   ICD-10-CM   1. Mild episode of recurrent major depressive disorder (HCC)  F33.0     2. Social anxiety disorder  F40.10     3. Generalized anxiety disorder  F41.1     4. 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 Already scheduled visit in this office 01/19/2021.  Richard Serve, PhD Richard Moore, PhD LP Clinical Psychologist, [facility] [address] [place], [place] [address] [phone]

## 2021-01-16 ENCOUNTER — Telehealth: Payer: Self-pay | Admitting: Family Medicine

## 2021-01-16 NOTE — Telephone Encounter (Signed)
Spoke to [facility], they are booked out till Jully 2023.  Can you please and thank send the referral to another office? Thanks .  Dm/cma

## 2021-01-16 NOTE — Telephone Encounter (Signed)
Called and spoke to patient and  advised that I would try to call [facility] to see if there is anything I can do to help with getting the appointment sooner than 08/2021. Called [phone], had to leave a VM to rtn call.   Dm/cma

## 2021-01-19 ENCOUNTER — Other Ambulatory Visit: Payer: Self-pay

## 2021-01-19 ENCOUNTER — Ambulatory Visit (INDEPENDENT_AMBULATORY_CARE_PROVIDER_SITE_OTHER): Payer: Medicare Other | Admitting: Psychiatry

## 2021-01-19 DIAGNOSIS — F341 Dysthymic disorder: Secondary | ICD-10-CM | POA: Diagnosis not present

## 2021-01-19 DIAGNOSIS — F411 Generalized anxiety disorder: Secondary | ICD-10-CM | POA: Diagnosis not present

## 2021-01-19 DIAGNOSIS — F330 Major depressive disorder, recurrent, mild: Secondary | ICD-10-CM | POA: Diagnosis not present

## 2021-01-19 DIAGNOSIS — F99 Mental disorder, not otherwise specified: Secondary | ICD-10-CM

## 2021-01-19 DIAGNOSIS — F4010 Social phobia, unspecified: Secondary | ICD-10-CM

## 2021-01-19 DIAGNOSIS — F5105 Insomnia due to other mental disorder: Secondary | ICD-10-CM

## 2021-01-19 NOTE — Progress Notes (Signed)
Psychotherapy Progress Note [facility] 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  Current chair was not there yesterday but will handle the 4pm service 12/24, while he does 10:30pm, and she will do 10am Xmas Day.  Has a basic pledge of help from a couple other volunteers.    Called in to PCP to see about moving up his "test" (neuropsych?) from late July.  7 month wait is intolerable with there being some question of brain tumor, or Alzheimer's, or other things.  Discussed what to expect of a neuropsych, possible med SE right now, recollection that his tremor started when he came off clonazepam c. 2018 when psychiatric PA tried to eliminate it fairly rapidly after 14 years.  Identified clonazepam (1mg  TID for est 18 years now) as a most likely medication issue, and how to address taper if he chooses.  Typically naps 3pm for 1.5 hrs.  Typical bedtime 12-1am, pushed by screen time (gay porn) after some time reading.  Porn watching remains only ideationally satisfying, hitting on themes of being attractive to others, often older men portrayed as having seductive powers, often enough turning straight men.  Physically nonresponsive, with ED and lack of orgasm.  Discussed from neuroactive standpoint and educated in sleep hygiene considerations.  Therapeutic modalities: Cognitive Behavioral Therapy, Solution-Oriented/Positive Psychology, and Psycho-education/Bibliotherapy  Mental Status/Observations:  Appearance:   Casual and Neat     Behavior:  Appropriate  Motor:  Normal   Speech/Language:   Clear and Coherent and less affected  Affect:  Appropriate  Mood:  Less anxious, not downcast  Thought process:  normal  Thought content:    worry  Sensory/Perceptual disturbances:    WNL  Orientation:  Fully oriented  Attention:  Good    Concentration:  Good  Memory:  WNL  Insight:    Good  Judgment:   Good  Impulse Control:  Fair   Risk Assessment: Danger to Self: No Self-injurious Behavior: No Danger to Others: No Physical Aggression / Violence: No Duty to Warn: No Access to Firearms a concern: No  Assessment of progress:  progressing  Diagnosis:   ICD-10-CM   1. Social anxiety disorder  F40.10     2. Mild episode of recurrent major depressive disorder (HCC)  F33.0     3. 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 Generalized anxiety disorder  F41.1     5. Insomnia due to other mental disorder  F51.05    F99      Plan:  Options for alternative neuropsychological evaluations, recommend get in touch with neurologist office about moving it up Option to consider benzo taper -- see psychiatry if interested, but most likely alpha or beta blocker to cover w/d sxs and likely gentle approach of titrating 1/4 mg per month for 1, 2, or all 3 daily doses Sleep hygiene Reverse the order of reading and screen time to make for a blue light curfew Segregate screen time from the bed See about reducing naps to less than an hour Endorse continuing to serve in his church, learn the  ropes better and recruit further to lighten load and provide for succession Other recommendations/advice as may be noted above Continue to utilize previously learned skills ad lib Maintain medication as prescribed and work faithfully with relevant prescriber(s) if any changes are desired or seem indicated Call the clinic on-call service, 988/hotline, 911, or present to [facility] or ER if any life-threatening psychiatric crisis Return for session(s) already scheduled. Already scheduled visit in this  office 01/28/2021.  Blanchie Serve, PhD [name], PhD LP Clinical Psychologist, [facility] [address] [place], [place] [address] [phone]

## 2021-01-19 NOTE — Telephone Encounter (Signed)
Pt called back. °

## 2021-01-19 NOTE — Telephone Encounter (Signed)
LT VM to rtn call. Dm/cma  

## 2021-01-19 NOTE — Telephone Encounter (Signed)
Patient notified to call Dr Doristine Devoid office.  Dm/cma

## 2021-01-20 ENCOUNTER — Other Ambulatory Visit: Payer: Self-pay | Admitting: Physician Assistant

## 2021-01-21 ENCOUNTER — Other Ambulatory Visit: Payer: Self-pay | Admitting: Physician Assistant

## 2021-01-21 ENCOUNTER — Ambulatory Visit: Payer: Medicare Other | Admitting: Psychology

## 2021-01-21 ENCOUNTER — Other Ambulatory Visit: Payer: Self-pay

## 2021-01-21 ENCOUNTER — Encounter: Payer: Self-pay | Admitting: Psychology

## 2021-01-21 ENCOUNTER — Ambulatory Visit (INDEPENDENT_AMBULATORY_CARE_PROVIDER_SITE_OTHER): Payer: Medicare Other | Admitting: Psychology

## 2021-01-21 DIAGNOSIS — F331 Major depressive disorder, recurrent, moderate: Secondary | ICD-10-CM | POA: Diagnosis not present

## 2021-01-21 DIAGNOSIS — F411 Generalized anxiety disorder: Secondary | ICD-10-CM | POA: Diagnosis not present

## 2021-01-21 DIAGNOSIS — G3184 Mild cognitive impairment, so stated: Secondary | ICD-10-CM

## 2021-01-21 DIAGNOSIS — R4189 Other symptoms and signs involving cognitive functions and awareness: Secondary | ICD-10-CM

## 2021-01-21 HISTORY — DX: Mild cognitive impairment of uncertain or unknown etiology: G31.84

## 2021-01-21 NOTE — Progress Notes (Signed)
° °  Psychometrician Note   Cognitive testing was administered to Richard Wilson by Milana Kidney, B.S. (psychometrist) under the supervision of Dr. Christia Reading, Ph.D., licensed psychologist on 01/21/21. Mr. Richard Wilson did not appear overtly distressed by the testing session per behavioral observation or responses across self-report questionnaires. Rest breaks were offered.    The battery of tests administered was selected by Dr. Christia Reading, Ph.D. with consideration to Richard Wilson current level of functioning, the nature of his symptoms, emotional and behavioral responses during interview, level of literacy, observed level of motivation/effort, and the nature of the referral question. This battery was communicated to the psychometrist. Communication between Dr. Christia Reading, Ph.D. and the psychometrist was ongoing throughout the evaluation and Dr. Christia Reading, Ph.D. was immediately accessible at all times. Dr. Christia Reading, Ph.D. provided supervision to the psychometrist on the date of this service to the extent necessary to assure the quality of all services provided.    Richard Wilson will return within approximately 1-2 weeks for an interactive feedback session with Dr. Melvyn Novas at which time his test performances, clinical impressions, and treatment recommendations will be reviewed in detail. Richard Wilson understands he can contact our office should he require our assistance before this time.  A total of 140 minutes of billable time were spent face-to-face with Richard Wilson by the psychometrist. This includes both test administration and scoring time. Billing for these services is reflected in the clinical report generated by Dr. Christia Reading, Ph.D.  This note reflects time spent with the psychometrician and does not include test scores or any clinical interpretations made by Dr. Melvyn Novas. The full report will follow in a separate note.

## 2021-01-21 NOTE — Progress Notes (Signed)
NEUROPSYCHOLOGICAL EVALUATION [place] Department of Neurology  Date of Evaluation: January 21, 2021  Reason for Referral:   Richard Wilson is a 70 y.o. 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   Assessment and Plan:   Clinical Impression(s): Richard Wilson pattern of performance is suggestive of a few somewhat isolated and non-specific impairments. These included cognitive flexibility, performance on a simulated bill paying task, encoding (i.e., learning) aspects of verbal memory, and delayed retrieval/recognition across a list-based memory task. An additional weakness was exhibited across processing speed. While a normative weakness was exhibited across a receptive language task, he only missed a few items overall, making this less likely to be a current functional impairment. Performances were adequate when compared to age-matched peers across domains of attention/concentration, abstract reasoning, safety/judgment, expressive language, visuospatial abilities, and both story and figure-based retrieval/recognition aspects of memory.   There remains the potential that, given his advanced level of educational attainment and premorbid intellectual levels, that current scores in the below average and even lower limits of the average normative ranges represent a decline in functioning. However, there is no prior testing available to confirm this. At the present time, given that Richard Wilson denied difficulties completing instrumental activities of daily living (ADLs) independently, he meets criteria for a Mild Neurocognitive Disorder ("mild cognitive impairment") at the present time.  The etiology for  ongoing dysfunction is unclear. There certainly remains the potential that weaknesses are due to a combination of medication side effects, moderate to severe psychiatric distress, and acute testing anxiety. Despite prior efforts to discontinue Klonopin/clonazepam, he continues to be on a fairly large dose (1mg , three times per day). Cognitive side effects are well established with this medication. Additionally, acute levels of severe anxiety and moderate depression would also negatively impact cognitive abilities, especially across processing speed, cognitive flexibility, and memory. This combined etiology should remain on his differential.   I am mildly concerned about the number of scores falling in either the below average range or lower limits of the average range as representing subtle, progressive decline caused by an underlying neurodegenerative condition in very early stages. Unfortunately, I do not have any prior neuroimaging to consider possible vascular contributions or other anatomical correlates. Regardless, his current pattern of strengths and weaknesses does not align with expected patterns of dysfunction for our most common culprits. Delayed memory/recognition was appropriate on 2/3 memory tasks, which would be inconsistent with Alzheimer's disease. Visuospatial abilities were intact and he does not display fully-formed visual hallucinations or REM sleep behaviors, making Lewy body disease less likely. The lack of personality changes makes the behavioral variant of frontotemporal dementia unlikely. Despite his report of changes in expressive language, he generally performed quite well on these tasks, making a primary progressive aphasia less likely. Based upon her recent neurological exam, [name] did not feel that Parkinson's disease was appropriate and I certainly do not have current evidence to suggest the presence of a more rare parkinsonian presentation like progressive supranuclear palsy or  corticobasal degeneration. Continued medical monitoring will be very important moving forward as it may be that, if an underlying neurodegenerative condition is indeed present, time must pass before identifiable and measurable symptoms emerge.   Recommendations: I would recommend that Richard Wilson be referred for neuroimaging (i.e., brain MRI)  to assess for any potential anatomical correlates for ongoing dysfunction.   A repeat neuropsychological evaluation in 18-24 months (or sooner if functional decline is noted) is recommended to assess the trajectory of future cognitive decline should it occur. This will also aid in future efforts towards improved diagnostic clarity.  While he denied ongoing symptoms, his medical records do report a history of obstructive sleep apnea. If this condition is present and untreated, this will also worsen cognitive functioning. It will further increase his risk for heart attack, stroke, and dementia. If present, he is encouraged to adhere to treatment recommendations and utilize a CPAP machine nightly.   A combination of medication and psychotherapy has been shown to be most effective at treating symptoms of anxiety and depression. As such, Richard Wilson is encouraged to speak with his prescribing physician regarding medication adjustments to optimally manage these symptoms. While he seemed resistant to getting off the clonazepam, doing so could be beneficial for his overall cognition. Its effectiveness also seems somewhat limited given the extent of ongoing anxiety despite his current dosing.   Richard Wilson is encouraged to continue working with his individual psychologist to address ongoing psychiatric distress. It is worth saying that Richard Wilson likely requires therapy which is collaborative, goal-focused, and not purely supportive in nature. He is encouraged to ensure that current therapeutic pursuits are goal-directed and that he feels like he is making active progress  towards the goals he and his provider set.   Richard Wilson is encouraged to attend to lifestyle factors for brain health (e.g., regular physical exercise, good nutrition habits, regular participation in cognitively-stimulating activities, and general stress management techniques), which are likely to have benefits for both emotional adjustment and cognition. In fact, in addition to promoting good general health, regular exercise incorporating aerobic activities (e.g., brisk walking, jogging, cycling, etc.) has been demonstrated to be a very effective treatment for depression and stress, with similar efficacy rates to both antidepressant medication and psychotherapy. Optimal control of vascular risk factors (including safe cardiovascular exercise and adherence to dietary recommendations) is encouraged. Continued participation in activities which provide mental stimulation and social interaction is also recommended.   Memory can be improved using internal strategies such as rehearsal, repetition, chunking, mnemonics, association, and imagery. External strategies such as written notes in a consistently used memory journal, visual and nonverbal auditory cues such as a calendar on the refrigerator or appointments with alarm, such as on a cell phone, can also help maximize recall.    Because he shows better recall for structured information, he will likely understand and retain new information better if it is presented to him in a meaningful or well-organized manner at the outset, such as grouping items into meaningful categories or presenting information in an outlined, bulleted, or story format.   To address problems with processing speed, he may wish to consider:   -Ensuring that he is alerted when essential material or instructions are being presented   -Adjusting the speed at which new information is presented   -Allowing for more time in comprehending, processing, and responding in conversation  To address  problems with executive dysfunction, he may wish to consider:   -Avoiding external distractions when needing to concentrate   -Limiting exposure to fast paced environments with multiple sensory demands   -Writing down complicated information and using checklists   -Attempting and completing one task at a time (i.e., no multi-tasking)   -Verbalizing aloud each step of a task to maintain focus   -  Reducing the amount of information considered at one time  Review of Records:   Richard Wilson was seen by [facility] Wells Guiles Tat, D.O.) on 01/08/2021 for follow-up of ongoing tremor. 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 Per [name], parkinsonian features have improved since being taken off this medication. Richard Wilson had expressed concerns that tremors were due to a prior behavioral health clinician tapering him off clonazepam and him experiencing withdrawal symptoms. He was eventually re-started on and currently takes this medication (1 mg, three times per day). In her neurological exam, [name] noted normal tone in upper and lower extremities bilaterally. No resting, postural, or intention tremor was observed. There was a tremor in the right hand when pouring water (some entrainment). There were no decremation with RAMs or difficulty with archimedes spirals. Ultimately [name] continued to express concerns that tremors were due to medication side effects (as well as a potential B12 deficiency contribution). During this appointment, Richard Wilson also expressed concerns surrounding short-term memory and expressive language. Ultimately, Richard Wilson was referred for a comprehensive neuropsychological evaluation to characterize his cognitive abilities and to assist with diagnostic clarity and treatment planning.   No neuroimaging was available for my review.   Past Medical History:  Diagnosis Date   Arthritis of knee 08/31/2017   Chronic  idiopathic constipation 10/23/2020   Chronic kidney disease, stage 3a 66/29/4765   Diastolic dysfunction 46/50/3546   Diverticulosis of large intestine without hemorrhage 09/30/2013   Elevated PSA 12/22/2012   Erectile dysfunction 10/23/2020   Generalized anxiety disorder 01/01/2020   Hyperglycemia 09/24/2020   Intention tremor    Major depressive disorder 01/01/2020   Mild mitral regurgitation 08/15/2013   Nephrolithiasis 06/22/2016   Obstructive sleep apnea 04/13/2015   Orthostatic lightheadedness 10/23/2020   Primary osteoarthritis of both hips 07/30/2019   Pure hypercholesterolemia 12/15/2018   RBBB (right bundle branch block) 12/03/2012   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 Swallow whole., Disp: , Rfl:    BioGaia Probiotic (BIOGAIA/GERBER SOOTHE) LIQD, Take 5 drops by mouth daily at 8 pm., Disp: , Rfl:    busPIRone (BUSPAR) 30 MG tablet, Take 1 tablet (30 mg total) by mouth 2 (two) times daily., Disp: 60 tablet, Rfl: 1   clonazePAM (KLONOPIN) 1 MG tablet, Take 1 tablet (1 mg total) by mouth 3 (three) times daily as needed for anxiety., Disp: 90 tablet, Rfl: 5   lamoTRIgine (LAMICTAL) 150 MG tablet, Take 1 tablet (150 mg total) by mouth daily., Disp: 30 tablet, Rfl: 0   naproxen (NAPROSYN) 500 MG tablet, TAKE 1 TABLET BY MOUTH 2 TIMES DAILY WITH A MEAL., Disp: 30 tablet, Rfl: 0   psyllium (METAMUCIL) 58.6 % powder, Take 1 packet by mouth in the morning and at bedtime., Disp: , Rfl:    sertraline (ZOLOFT) 100 MG tablet, Take 2 tablets (200 mg total) by mouth daily., Disp: 180 tablet, Rfl: 0   traZODone (DESYREL) 150 MG tablet, Take 1 tablet (150 mg total) by mouth at bedtime., Disp: 30 tablet, Rfl: 0  Clinical Interview:   The following  information was obtained during a clinical interview with Richard Wilson prior to cognitive testing.  Cognitive Symptoms: Decreased short-term memory: Denied. He acknowledged that trouble recalling names might represent a mild, infrequent issue. However, no significant concerns were reported.  Decreased long-term memory: Denied. Decreased attention/concentration: Denied. Reduced processing speed: Endorsed. Difficulties with executive functions: Denied. He also denied trouble with impulsivity, as well as any significant personality changes.  Difficulties with emotion regulation: Denied. Difficulties with receptive language: Denied. Difficulties with word finding: Endorsed. He noted slowing his speech and speaking "very distinctly" where he can make very deliberate word choices. This was done to limit mistakes, such as him saying an unintended word in the middle of a sentence which is somewhat similar in sound but an entirely different meaning. This was said to have progressively worsened over time.  Decreased visuoperceptual ability: Denied.  Difficulties completing ADLs: Denied. He does rely on a written checklist of his medications to help with recall. He also noted back up very slowly while driving due to difficulty turning his neck towards his left side.   Additional Medical History: History of traumatic brain injury/concussion: Denied. History of stroke: Denied. History of seizure activity: Denied. History of known exposure to toxins: Denied. Symptoms of chronic pain: Denied outside of right hip tendonitis.  Experience of frequent headaches/migraines: Denied. Frequent instances of dizziness/vertigo: Denied.  Sensory changes: He wears glasses with benefit. Other sensory changes/difficulties (e.g., hearing, taste, or smell) was denied.  Balance/coordination difficulties: Endorsed. He noted some mild balance instability where he will bump into objects in his environment unintentionally. One  side of the body was not said to be worse than the other. He reported his last fall occurring in December 2021 and that this was not serious. No more recent falls were reported.  Other motor difficulties: Endorsed. Mild tremors were said to be present in his right hand. He was unsure if they had improved or declined over time and felt that they were possible due to medication side effects. However, he expressed direct concerns surrounding the presence of a brain tumor or other neurological conditions which could be causing this.   Sleep History: Estimated hours obtained each night: 6-8 hours.  Difficulties falling asleep: Somewhat. He has been working with his psychologist on sleep hygiene practices such as not napping during the day or not viewing his PC screen immediately before bed. This has seemed to be somewhat helpful.  Difficulties staying asleep: Somewhat. Feels rested and refreshed upon awakening: "Not all the time."   History of snoring: Denied. History of waking up gasping for air: Denied. Witnessed breath cessation while asleep: Denied.  History of vivid dreaming: Endorsed. He endorsed experiencing very vivid dreaming on a nightly basis to the extent where he has to convince himself that everything he just dreamt did not actually happening after awakening. He wondered if this was caused by him taking trazodone.  Excessive movement while asleep: Denied. Instances of acting out his dreams: Denied.  Psychiatric/Behavioral Health History: Depression: He acknowledged a somewhat longstanding history of depression which has vacillated from mild to severe presentations. He attempted suicide in 1996 and reported having four "nervous breakdowns" throughout his life, requiring brief psychiatric hospitalizations. When asked about his current mood, he responded "very concerned about this," referring to potential results of cognitive testing. Current suicidal ideation, intent, or plan was denied.   Anxiety: Endorsed. He reported generalized anxiety symptoms which have also vacillated from mild to severe presentations over the course of many years. He acknowledged a tendency to catastrophize when discussing concerns surrounding the presence of a brain tumor.  He meets regularly with a psychologist to address ongoing mood-related concerns.  Mania: Denied. Trauma History: Denied. Visual/auditory hallucinations: Denied. Delusional thoughts: Denied.  Tobacco: Denied. Alcohol: He reported consuming an occasional glass of wine and denied a history of problematic alcohol abuse or dependence.  Recreational drugs: Denied.  Family History: Problem Relation Age of Onset   Cancer Mother        Ovarian   Heart disease Mother    Memory loss Mother        with advanced age   Prostate cancer Father    Throat cancer Maternal Grandfather    Heart attack Paternal Grandfather    Cancer Maternal Aunt        Breast, metastatic   This information was confirmed by Richard Wilson. [place].  Academic/Vocational History: Highest level of educational attainment: 18 years. 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 No relative weaknesses were identified.  History of developmental delay: Denied. History of grade repetition: Denied. Enrollment in special education courses: Denied. History of LD/ADHD: Denied.  Employment: Retired. He previously worked as a Photographer at [employer]. He went on disability in 2001 due to psychiatric symptoms.   Evaluation Results:   Behavioral Observations: Richard Wilson was unaccompanied, arrived to his appointment on time, and was appropriately dressed and groomed. He appeared alert and oriented. Observed gait and station were within normal limits. Gross motor functioning appeared intact upon informal observation and no abnormal movements (e.g., tremors) were noted. His affect was noticeably nervous.  However, this did range appropriately given the subject being discussed during the clinical interview or the task at hand during testing procedures. Spontaneous speech was fluent. His speech was slowed at times and consistent with his description of being very careful and deliberate with his speech to prevent any word substitution errors.  Mild word finding difficulties were also observed. Thought processes were coherent, organized, and normal in content. Insight into his cognitive difficulties appeared somewhat limited in that ongoing dysfunction was more widespread that simply expressive language.   During testing, Richard Wilson commented several times that he was quite nervous about the testing process. Sustained attention was appropriate. Task engagement was adequate and he persisted when challenged. Symptoms of tremor were minimally present if present at all throughout a majority of testing. This was true even across motorized tasks where Richard Wilson lines were often crisp and without any sign of shakiness. Overall, Richard Wilson was cooperative with the clinical interview and subsequent testing procedures.   Adequacy of Effort: The validity of neuropsychological testing is limited by the extent to which the individual being tested may be assumed to have exerted adequate effort during testing. Richard Wilson expressed his intention to perform to the best of his abilities and exhibited adequate task engagement and persistence. Scores across stand-alone and embedded performance validity measures were largely within expectation. His single task which scored in the below expectation range missed clinical cut-offs by a single point. As such, the results of the current evaluation are believed to be a valid representation of Richard Wilson current cognitive functioning.  Test Results: Richard Wilson. Jafri was fully oriented at the time of the current evaluation.  Intellectual abilities based upon educational and vocational  attainment were estimated to be in the above average range. Premorbid abilities were estimated to be within the well above average range based upon a single-word reading test.   Processing speed was well below average to below  average. Basic attention was average. More complex attention (e.g., working memory) was also average. Cognitive flexibility was exceptionally low. He did perform in the below average range on a task assessing nonverbal abstract reasoning. Performance on a task assessing safety and judgment was well above average.   Assessed receptive language abilities were well below average. However, he only missed a total of four points across this task, making a functional impairment less likely. Richard Wilson. [place] also did not exhibit any difficulties comprehending task instructions and answered all questions asked of him appropriately. Assessed expressive language (e.g., verbal fluency, confrontation naming, writing samples) was mildly variable but overall below average to average.     Assessed visuospatial/visuoconstructional abilities were below average to average.    Learning (i.e., encoding) of novel verbal information was exceptionally low to well below average. Spontaneous delayed recall (i.e., retrieval) of previously learned information was well below average across a list learning task but average across story and figure based tasks. Retention rates were 133% across a story learning task, 40% across a list learning task, and 56% across a figure drawing task. Performance across recognition tasks was exceptionally low across a list learning task but average across story and figure based tasks, suggesting some evidence for information consolidation.   Results of emotional screening instruments suggested that recent symptoms of generalized anxiety were in the severe range, while symptoms of depression were within the moderate range. A screening instrument assessing recent sleep quality suggested  the presence of minimal sleep dysfunction.  Tables of Scores:   Note: This summary of test scores accompanies the interpretive report and should not be considered in isolation without reference to the appropriate sections in the text. Descriptors are based on appropriate normative data and may be adjusted based on clinical judgment. Terms such as "Within Normal Limits" and "Outside Normal Limits" are used when a more specific description of the test score cannot be determined.       Percentile - Normative Descriptor > 98 - Exceptionally High 91-97 - Well Above Average 75-90 - Above Average 25-74 - Average 9-24 - Below Average 2-8 - Well Below Average < 2 - Exceptionally Low       Validity:   DESCRIPTOR       ACS Word Choice: --- --- Outside Normal Limits  Dot Counting Test: --- --- Within Normal Limits  RBANS Effort Index: --- --- Within Normal Limits  WAIS-IV Reliable Digit Span: --- --- Within Normal Limits       Orientation:      Raw Score Percentile   NAB Orientation, Form 1 29/29 --- ---       Cognitive Screening:      Raw Score Percentile   SLUMS: 22/30 --- ---       RBANS, Form A: Standard Score/ Scaled Score Percentile   Total Score 69 2 Well Below Average  Immediate Memory 57 <1 Exceptionally Low    List Learning 2 <1 Exceptionally Low    Story Memory 4 2 Well Below Average  Visuospatial/Constructional 89 23 Below Average    Figure Copy 10 50 Average    Line Orientation 14/20 17-25 Below Average to Average  Language 87 19 Below Average    Picture Naming 10/10 51-75 Average    Semantic Fluency 5 5 Well Below Average  Attention 75 5 Well Below Average    Digit Span 7 16 Below Average    Coding 5 5 Well Below Average  Delayed Memory 71 3 Well Below Average  List Recall 2/10 3-9 Well Below Average    List Recognition 15/20 <2 Exceptionally Low    Story Recall 9 37 Average    Story Recognition 10/12 27-46 Average    Figure Recall 8 25 Average    Figure  Recognition 5/8 21-29 Average       Intellectual Functioning:      Standard Score Percentile   Barona Formula Estimated Premorbid IQ 113 81 Above Average        Standard Score Percentile   Test of Premorbid Functioning: 121 27 Well Above Average       Attention/Executive Function:     Trail Making Test (TMT): Raw Score (T Score) Percentile     Part A 61 secs.,  1 error (31) 3 Well Below Average    Part B 300 secs.,  3 errors (10) <1 Exceptionally Low        Symbol Digit Modalities Test (SDMT): Raw Score (Z-Score) Percentile     Oral 35 (-1.32) 9 Below Average        Scaled Score Percentile   WAIS-IV Digit Span: 8 25 Average    Forward 8 25 Average    Backward 9 37 Average    Sequencing 8 25 Average       D-KEFS Verbal Fluency Test: Raw Score (Scaled Score) Percentile     Letter Total Correct 29 (8) 25 Average    Category Total Correct 31 (8) 25 Average    Category Switching Total Correct 2 (1) <1 Exceptionally Low    Category Switching Accuracy 1 (1) <1 Exceptionally Low      Total Set Loss Errors 0 (13) 84 Above Average      Total Repetition Errors 1 (12) 75 Above Average       NAB Executive Functions Module, Form 1: T Score Percentile     Judgment 63 91 Well Above Average       Language:      Raw Score Percentile   Sentence Repetition: 13/22 11 Below Average       Verbal Fluency Test: Raw Score (T Score) Percentile     Phonemic Fluency (FAS) 29 (39) 14 Below Average    Animal Fluency 21 (52) 58 Average        NAB Language Module, Form 1: Standard Score/ T Score Percentile   Total Score 84 14 Below Average    Oral Production 54 66 Average    Auditory Comprehension 32 4 Well Below Average    Reading Comprehension 13/13 --- Within Normal Limits    Naming 31/31 (55) 69 Average    Writing 53 13 Average    Bill Payment 28 2 Well Below Average       Visuospatial/Visuoconstruction:      Raw Score Percentile   Clock Drawing: 10/10 --- Within Normal Limits         Scaled Score Percentile   WAIS-IV Block Design: 8 25 Average  WAIS-IV Matrix Reasoning: 7 16 Below Average       Mood and Personality:      Raw Score Percentile   Beck Depression Inventory - II: 28 --- Moderate  PROMIS Anxiety Questionnaire: 31 --- Severe       Additional Questionnaires:      Raw Score Percentile   PROMIS Sleep Disturbance Questionnaire: 18 --- None to Slight   Informed Consent and Coding/Compliance:   The current evaluation represents a clinical evaluation for the purposes previously outlined by the referral source and is in no  way reflective of a forensic evaluation.   Richard Wilson. [place] was provided with a verbal description of the nature and purpose of the present neuropsychological evaluation. Also reviewed were the foreseeable risks and/or discomforts and benefits of the procedure, limits of confidentiality, and mandatory reporting requirements of this provider. The patient was given the opportunity to ask questions and receive answers about the evaluation. Oral consent to participate was provided by the patient.   This evaluation was conducted by Christia Reading, Ph.D., ABPP-CN, board certified clinical neuropsychologist. Richard Wilson. Milne completed a clinical interview with Dr. Melvyn Novas, billed as one unit [phone], and 140 minutes of cognitive testing and scoring, billed as one unit [phone] and four additional units [id number]. Psychometrist Milana Kidney, B.S., assisted Dr. Melvyn Novas with test administration and scoring procedures. As a separate and discrete service, Dr. Melvyn Novas spent a total of 180 minutes in interpretation and report writing billed as one unit [phone] and two units 96133.

## 2021-01-28 ENCOUNTER — Other Ambulatory Visit: Payer: Self-pay

## 2021-01-28 ENCOUNTER — Encounter: Payer: Self-pay | Admitting: Physician Assistant

## 2021-01-28 ENCOUNTER — Ambulatory Visit (INDEPENDENT_AMBULATORY_CARE_PROVIDER_SITE_OTHER): Payer: Medicare Other | Admitting: Psychiatry

## 2021-01-28 ENCOUNTER — Ambulatory Visit (INDEPENDENT_AMBULATORY_CARE_PROVIDER_SITE_OTHER): Payer: Medicare Other | Admitting: Physician Assistant

## 2021-01-28 DIAGNOSIS — F99 Mental disorder, not otherwise specified: Secondary | ICD-10-CM | POA: Diagnosis not present

## 2021-01-28 DIAGNOSIS — F4010 Social phobia, unspecified: Secondary | ICD-10-CM

## 2021-01-28 DIAGNOSIS — F330 Major depressive disorder, recurrent, mild: Secondary | ICD-10-CM

## 2021-01-28 DIAGNOSIS — G3184 Mild cognitive impairment, so stated: Secondary | ICD-10-CM | POA: Diagnosis not present

## 2021-01-28 DIAGNOSIS — F5105 Insomnia due to other mental disorder: Secondary | ICD-10-CM | POA: Diagnosis not present

## 2021-01-28 DIAGNOSIS — F341 Dysthymic disorder: Secondary | ICD-10-CM

## 2021-01-28 DIAGNOSIS — F331 Major depressive disorder, recurrent, moderate: Secondary | ICD-10-CM

## 2021-01-28 MED ORDER — BUSPIRONE HCL 30 MG PO TABS: 30.0000 mg | ORAL_TABLET | Freq: Two times a day (BID) | ORAL | 5 refills | Status: DC

## 2021-01-28 MED ORDER — SERTRALINE HCL 100 MG PO TABS: 200.0000 mg | ORAL_TABLET | Freq: Every day | ORAL | 5 refills | Status: DC

## 2021-01-28 MED ORDER — LAMOTRIGINE 150 MG PO TABS: 150.0000 mg | ORAL_TABLET | Freq: Every day | ORAL | 5 refills | Status: DC

## 2021-01-28 NOTE — Progress Notes (Signed)
Psychotherapy Progress Note [facility] [name], PhD LP  Patient ID: Tylee Newby [facility] [name])    MRN: [id number] Therapy format: Individual psychotherapy Date: 01/28/2021      Start: 10:17a     Stop: 11:07a     Time Spent: 50 min Location: In-person   Session narrative (presenting needs, interim history, self-report of stressors and symptoms, applications of prior therapy, status changes, and interventions made in session) Christmas Eve services went fine, just low attendance attrib to bitter cold and separate service earlier for families.  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  Encouraged still to recruit, as he has,   Saw the neuropsychologist on cancellation, underwent what sounds like a standard short form IQ, memory, and executive functioning battery.  Found it laughable how he couldn't get some tasks, and in the telling seemed to throw up his hands too easily.  Discussed possibility that evaluation anxiety interfered with a good reading of his abilities.    Notes he has been going out of his way to be gracious with black folk in stores and other public spaces.  Candis Musa off the story a couple times but recalls being bullied by black boys in school, spent 3 years not going to the boys' room in order to avoid them, and feels it is this experience learning to avoid and buffer that drives him now.  Says there were classist, if not patently racist, attitudes among his elders, though mother was tended young by a [employer] surveyor and parents felt that using any slurs was "common" -- the worst criticism you could get in his [place].  Feels his public politeness is fear-motivated.  Examined together, offering that if any of the big names in civil rights were to join our conversation, they would most likely tell him that his choice of grocery shopping is far less important than whatever help a white conscience  could give economic opportunity, that his effort to acknowledge Black people in his surroundings is a step (as long as it's not artificial), and that they are sorry he got bullied -- they know what it feels like, and no one should.    Therapeutic modalities: Cognitive Behavioral Therapy and Solution-Oriented/Positive Psychology  Mental Status/Observations:  Appearance:   Casual and Neat     Behavior:  Appropriate and more mildly  dramatic than other times  Motor:  Normal  Speech/Language:   Clear and Coherent  Affect:  Appropriate  Mood:  dysthymic  Thought process:  Some tangent-taking  Thought content:    WNL  Sensory/Perceptual disturbances:    WNL  Orientation:  Fully oriented  Attention:  Good    Concentration:  Fair  Memory:  grossly intact  Insight:    Good  Judgment:   Good  Impulse Control:  Good   Risk Assessment: Danger to Self: No Self-injurious Behavior: No Danger to Others: No Physical Aggression / Violence: No Duty to Warn: No Access to Firearms a concern: No  Assessment of progress:  progressing  Diagnosis:   ICD-10-CM   1. Social anxiety disorder  F40.10     2. Mild episode of recurrent major depressive disorder (HCC)  F33.0     3. 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  (Available report on [facility] does make mention of anxiety and caution in interpreting lowest scores, but important for social anxiety's sake to have the  firsthand conversation.) Continue lifestyle efforts to best protect sleep and general health Per neuropsych report, look into sleep apnea and need for treatment and baseline MRI, a well as sufficiently cognitively stimulating activities and exercise.  Also begin applying recommendations for how he obtains information to how he will need to advocate for himself with others, and other particular cognitive recommendations made. Medication weaning may still be indicated -- most likely clonazepam, on a very gentle taper Other recommendations/advice as may be noted above Continue to utilize previously learned skills ad lib Maintain medication as prescribed and work faithfully with relevant prescriber(s) if any changes are desired or seem indicated Call the clinic on-call service, 988/hotline, 911, or present to [facility] or ER if any life-threatening psychiatric crisis Return for session(s) already scheduled. Already scheduled visit in this office 01/28/2021.  Blanchie Serve, PhD [name], PhD LP Clinical Psychologist, [facility] [address], [place], [place]  [address] [phone]

## 2021-01-28 NOTE — Progress Notes (Signed)
[facility] Med [facility]  Patient ID: Richard Wilson,  MRN: [id number]  PCP: Haydee Salter, MD  Date of Evaluation: 01/28/2021  time spent:20 minutes  Chief Complaint:  Chief Complaint   Anxiety; Depression; Follow-up     HISTORY/CURRENT STATUS: HPI For routine med check  States he decreased the Zoloft from 2 1/2 about 5 or 6 days ago because he was going to run out early.  He did not decrease the Zoloft back from 250 mg to 200 mg in the fall as we had discussed.  At this point he cannot tell any difference in the way he feels.  He is able to enjoy some things.  Energy and motivation depend on what is going on at the time.  Not isolating.  Not crying easily.  Appetite is normal and weight is stable.  No suicidal or homicidal thoughts.  Patient denies increased energy with decreased need for sleep, no increased talkativeness, no racing thoughts, no impulsivity or risky behaviors, no increased spending, no increased libido, no grandiosity, no increased irritability or anger, and no hallucinations.  Does continue to have anxiety but states he feels like "a legal addict" by using the Klonopin.  He has been on it since 2004.  If he does not take it 3 times a day he feels awful mentally and physically.  At some point he would like to try weaning, at least going to a lower dose if not able to get completely off of it.  He does feel panicky at times and the Klonopin does still help.  He is sleeping well as long as he has the trazodone.  Denies dizziness, syncope, seizures, numbness, tingling, tremor, tics, unsteady gait, slurred speech, confusion. Denies muscle or joint pain, stiffness, or dystonia.  Individual Medical History/ Review of Systems: Changes? :No     Past medications for mental health diagnoses include: Rozerem, Trazodone caused vivid dreams, Xanax, Buspar, mirtazapine caused excessive somnolence and fatigue with flulike symptoms, Belsomra, propranolol for tremor, Zoloft, Abilify  caused tremor, Klonopin, Effexor, Ativan, Wellbutrin, Nardil, lithium, gabapentin, Librium   Pertinent info after reviewing Dr. Evelena Leyden notes, his previous psychiatrist in [place]. Past suicide attempt in September 1996, by taking Tylenol.  He was admitted at that time to a psychiatric hospital in [facility].  Admitted to psych hospital multiple times in the past for "nervous breakdowns."  January 1985, February 1986, October 1991, September 1996, July 2000.  Allergies: Garamycin [gentamicin] and Penicillins  Current Medications:  Current Outpatient Medications:    aspirin EC 81 MG tablet, Take 81 mg by mouth daily. Swallow whole., Disp: , Rfl:    BioGaia Probiotic (BIOGAIA/GERBER SOOTHE) LIQD, Take 5 drops by mouth daily at 8 pm., Disp: , Rfl:    clonazePAM (KLONOPIN) 1 MG tablet, Take 1 tablet (1 mg total) by mouth 3 (three) times daily as needed for anxiety., Disp: 90 tablet, Rfl: 5   psyllium (METAMUCIL) 58.6 % powder, Take 1 packet by mouth in the morning and at bedtime., Disp: , Rfl:    traZODone (DESYREL) 150 MG tablet, TAKE 1 TABLET BY MOUTH AT BEDTIME., Disp: 90 tablet, Rfl: 0   busPIRone (BUSPAR) 30 MG tablet, Take 1 tablet (30 mg total) by mouth 2 (two) times daily., Disp: 60 tablet, Rfl: 5   lamoTRIgine (LAMICTAL) 150 MG tablet, Take 1 tablet (150 mg total) by mouth daily., Disp: 30 tablet, Rfl: 5   naproxen (NAPROSYN) 500 MG tablet, TAKE 1 TABLET BY MOUTH 2 TIMES  DAILY WITH A MEAL. (Patient not taking: Reported on 01/28/2021), Disp: 30 tablet, Rfl: 0   sertraline (ZOLOFT) 100 MG tablet, Take 2 tablets (200 mg total) by mouth daily., Disp: 60 tablet, Rfl: 5 Medication Side Effects: sexual dysfunction  Family Medical/ Social History: Changes?  no  MENTAL HEALTH EXAM:  There were no vitals taken for this visit.There is no height or weight on file to calculate BMI.  General Appearance: Casual and Well Groomed  Eye Contact:  Good  Speech:   Clear and Coherent and Normal Rate  Volume:  Normal  Mood:  Euthymic  Affect:  Congruent  Thought Process:  Goal Directed and Descriptions of Associations: Intact  Orientation:  Full (Time, Place, and Person)  Thought Content: Logical   Suicidal Thoughts:  No  Homicidal Thoughts:  No  Memory:  WNL  Judgement:  Good  Insight:  Good  Psychomotor Activity:  Normal  Concentration:  Concentration: Good  Recall:  Good  Fund of Knowledge: Good  Language: Good  Assets:  Desire for Improvement  ADL's:  Intact  Cognition: WNL  Prognosis:  Good    DIAGNOSES:    ICD-10-CM   1. Social anxiety disorder  F40.10     2. Major depressive disorder, recurrent episode, moderate (HCC)  F33.1     3. Insomnia due to other mental disorder  F51.05    F99           Receiving Psychotherapy: Yes    Dr. Jonni Sanger Mitchum.  Therapy is going well.   RECOMMENDATIONS:  PDMP was reviewed.  Last Klonopin filled 01/13/2021. I provided 20 minutes of face to face time during this encounter, including time spent before and after the visit in records review, medical decision making, counseling pertinent to today's visit, and charting.  We discussed the Klonopin, he has been on it since 2004 and yes he is physically dependent on it.  I do not want to change any medications here at the beginning of winter but we may be able to gradually wean him to a lower dose or even off the Klonopin at some point in the future.  It would probably take 6 months to a year for him to get off the Klonopin and then would need to make sure he has something else for as needed use. Overall, I think he is doing better from a medicinal standpoint.  We talked about the Zoloft.  He had told me he had been on 200 mg total all along and since he has been on that dose for almost a week now I think it is best just to leave him at that dose.  We can always go back up to a total of 250 mg if needed.  He verbalizes understanding. Continue Buspar 30  mg, 1 po bid.  Continue Klonopin 1 mg, 1 p.o. 3 times daily as needed. Continue Lamictal 150 mg, qd. Continue Zoloft 100 mg  2 p.o. daily. Continue trazodone 150 mg 1 p.o. nightly as needed. Continue counseling with Dr. [name]. Return in 6 weeks.  Donnal Moat, PA-C

## 2021-01-29 ENCOUNTER — Ambulatory Visit (INDEPENDENT_AMBULATORY_CARE_PROVIDER_SITE_OTHER): Payer: Medicare Other | Admitting: Psychology

## 2021-01-29 DIAGNOSIS — G3184 Mild cognitive impairment, so stated: Secondary | ICD-10-CM

## 2021-01-29 DIAGNOSIS — F411 Generalized anxiety disorder: Secondary | ICD-10-CM | POA: Diagnosis not present

## 2021-01-29 NOTE — Progress Notes (Signed)
° °  Neuropsychology Feedback Session Tillie Rung. [place] Department of Neurology  Reason for Referral:   Richard Wilson is a 70 y.o. 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   Feedback:   Mr. Goody completed a comprehensive neuropsychological evaluation on 01/21/2021. Please refer to that encounter for the full report and recommendations. Briefly, results suggested a few somewhat isolated and non-specific impairments. These included cognitive flexibility, performance on a simulated bill paying task, encoding (i.e., learning) aspects of verbal memory, and delayed retrieval/recognition across a list-based memory task. An additional weakness was exhibited across processing speed. While a normative weakness was exhibited across a receptive language task, he only missed a few items overall, making this less likely to be a current functional impairment. Performances were adequate when compared to age-matched peers across domains of attention/concentration, abstract reasoning, safety/judgment, expressive language, visuospatial abilities, and both story and figure-based retrieval/recognition aspects of memory. The etiology for ongoing dysfunction is unclear. There certainly remains the potential that weaknesses are due to a combination of medication side effects, moderate to severe psychiatric distress, and acute testing anxiety. Despite prior efforts to discontinue Klonopin/clonazepam, he continues to be on a fairly large dose (1mg , three times per day). Cognitive side effects are well established with this medication. Additionally, acute levels of severe anxiety and moderate depression would also negatively impact cognitive  abilities, especially across processing speed, cognitive flexibility, and memory. This combined etiology should remain on his differential.   Mr. Popowski was unaccompanied during the current feedback session. Content of the current session focused on the results of his neuropsychological evaluation. Mr. Mota was given the opportunity to ask questions and his questions were answered. He was encouraged to reach out should additional questions arise. A copy of his report was provided at the conclusion of the visit. He also requested that it be faxed directly to his psychologist and psychiatrist.     30 minutes were spent conducting the current feedback session with Mr. Sturdevant, billed as one unit [phone].

## 2021-01-30 ENCOUNTER — Other Ambulatory Visit: Payer: Self-pay

## 2021-01-30 ENCOUNTER — Telehealth: Payer: Self-pay | Admitting: Neurology

## 2021-01-30 DIAGNOSIS — R413 Other amnesia: Secondary | ICD-10-CM

## 2021-01-30 NOTE — Telephone Encounter (Signed)
Patient called and told of possible hardening of arteries before the MRI since results post to [facility] chart. Order has been put in and sent to GI

## 2021-01-30 NOTE — Telephone Encounter (Signed)
See Dr. Gustavus Bryant notes.  Not sure insurance will pay for MRI brain without gad but we can try.  Dx:  memory loss.  Let pt know that we will order.  Let him also know that it likely will show some hardening of arteries in brain (common in this age group).  I try to tell pts that before ordering scan since they get results before we do.

## 2021-01-30 NOTE — Telephone Encounter (Signed)
-----   Message from Hazle Coca, PhD sent at 01/29/2021  3:06 PM EST ----- He very much wants a brain MRI to rule out a tumor. I think getting a baseline MRI wouldn't be a bad idea and did put it in my recommendations. But I didn't see anything suggesting a focal deficit that a tumor would cause.

## 2021-02-03 ENCOUNTER — Ambulatory Visit (INDEPENDENT_AMBULATORY_CARE_PROVIDER_SITE_OTHER): Payer: Medicare Other | Admitting: Psychiatry

## 2021-02-03 ENCOUNTER — Other Ambulatory Visit: Payer: Self-pay

## 2021-02-03 DIAGNOSIS — F99 Mental disorder, not otherwise specified: Secondary | ICD-10-CM

## 2021-02-03 DIAGNOSIS — F341 Dysthymic disorder: Secondary | ICD-10-CM

## 2021-02-03 DIAGNOSIS — F5105 Insomnia due to other mental disorder: Secondary | ICD-10-CM

## 2021-02-03 DIAGNOSIS — F4010 Social phobia, unspecified: Secondary | ICD-10-CM | POA: Diagnosis not present

## 2021-02-03 DIAGNOSIS — G3184 Mild cognitive impairment, so stated: Secondary | ICD-10-CM

## 2021-02-03 DIAGNOSIS — F331 Major depressive disorder, recurrent, moderate: Secondary | ICD-10-CM | POA: Diagnosis not present

## 2021-02-03 NOTE — Progress Notes (Signed)
Psychotherapy Progress Note [facility] [name], PhD LP  Patient ID: Richard Wilson [facility] [name])    MRN: [id number] Therapy format: Individual psychotherapy Date: 02/03/2021      Start: 8:15a     Stop: 9:00a     Time Spent: 45 min Location: In-person   Session narrative (presenting needs, interim history, self-report of stressors and symptoms, applications of prior therapy, status changes, and interventions made in session) Addressed neuropsych results, posted on EHR 01/21/21, unbeknownst to [place], and he had Dr. Melvyn Novas fax over for benefit of Ms, Adelene Idler and myself.  As expected, it showed a mix of modest deficits consistent with evaluation anxiety as well as lifestyle and medication effects, but nevertheless diagnosed MCI.  Discussed roles of longterm anxiety in terms of wear and tear on cells and blood vessels and test anxiety especially as manifested in inhibited comprehension of instructions and tendency to give up easily.  Generally accepted but still wants MRI and has pursued through neurologist.  Acknowledges he has dramatic tendencies, and is to some extent hooked on dramatizing, fantasy.  In theater terms, he is apt to "chew the scenery".  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  Discussed motivations to serve, affirming his values, including continuing his mother's honored example and hoping to find connection and family in church service, and continued to encourage sticking with his own good reasons while clarifying what to expect and how to get help.  Therapeutic modalities: Cognitive Behavioral Therapy, Solution-Oriented/Positive Psychology, Ego-Supportive, and Assertiveness/Communication  Mental Status/Observations:  Appearance:   Casual and Neat     Behavior:  Appropriate and Attention-Seeking  Motor:   Normal  Speech/Language:   Clear and Coherent  Affect:  Appropriate and dramatic  Mood:  anxious and dysthymic  Thought process:  normal  Thought content:    Rumination  Sensory/Perceptual disturbances:    WNL  Orientation:  Fully oriented  Attention:  Good    Concentration:  Fair  Memory:  WNL  Insight:    Fair  Judgment:   Good  Impulse Control:  Fair   Risk Assessment: Danger to Self: No Self-injurious Behavior: No Danger to Others: No Physical Aggression / Violence: No Duty to Warn: No Access to Firearms a concern: No  Assessment of progress:  progressing  Diagnosis:   ICD-10-CM   1. Social anxiety disorder  F40.10     2. Major depressive disorder, recurrent episode, moderate (HCC)  F33.1     3. Insomnia due to other mental disorder  F51.05    F99     4. 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 Mild cognitive impairment of uncertain or unknown etiology (suspect identifed B12, plus effects of anticonvulsant, sedative, anxiety, and vascualr aging)  G31.84      Plan:  Anxiety/discontentment: Refrain from conclusion-jumping and dramatizing  Continue reframing service decisions in terms of values and preferences, not hardships Assertively clarify commitments from others, what help can be had, and terms for his own service MCI: Continue lifestyle efforts to best protect sleep and general health Per neuropsych report, look into sleep apnea and need for treatment and baseline MRI, a well as sufficiently cognitively stimulating activities and exercise.  Also begin applying recommendations for how he obtains information to how he will need to advocate for himself with  others, and other particular cognitive recommendations made. Medication weaning may still be indicated -- most likely clonazepam, on a very gentle taper Process addiction tendencies Refrain from overspending on gold/jewelry, keep within sober budget Porn use is optional -- judge it by its results (soothing or  isolating/agitating) Seek other soothers/pleasures before indulging Other recommendations/advice as may be noted above Continue to utilize previously learned skills ad lib Maintain medication as prescribed and work faithfully with relevant prescriber(s) if any changes are desired or seem indicated Call the clinic on-call service, 988/hotline, 911, or present to [facility] or ER if any life-threatening psychiatric crisis Return for session(s) already scheduled. Already scheduled visit in this office 02/10/2021.  Blanchie Serve, PhD [name], PhD LP Clinical Psychologist, [facility] [address] [place], [place] [address] [phone]

## 2021-02-06 ENCOUNTER — Inpatient Hospital Stay: Admission: RE | Admit: 2021-02-06 | Payer: Medicare Other | Source: Ambulatory Visit

## 2021-02-09 ENCOUNTER — Ambulatory Visit
Admission: RE | Admit: 2021-02-09 | Discharge: 2021-02-09 | Disposition: A | Discharge status: Home / Self Care | Payer: Medicare Other | Source: Ambulatory Visit | Attending: Neurology | Admitting: Neurology

## 2021-02-09 DIAGNOSIS — R413 Other amnesia: Secondary | ICD-10-CM

## 2021-02-10 ENCOUNTER — Ambulatory Visit (INDEPENDENT_AMBULATORY_CARE_PROVIDER_SITE_OTHER): Payer: Medicare Other | Admitting: Psychiatry

## 2021-02-10 ENCOUNTER — Other Ambulatory Visit: Payer: Self-pay

## 2021-02-10 DIAGNOSIS — F341 Dysthymic disorder: Secondary | ICD-10-CM

## 2021-02-10 DIAGNOSIS — G3184 Mild cognitive impairment, so stated: Secondary | ICD-10-CM

## 2021-02-10 DIAGNOSIS — F4010 Social phobia, unspecified: Secondary | ICD-10-CM

## 2021-02-10 DIAGNOSIS — F331 Major depressive disorder, recurrent, moderate: Secondary | ICD-10-CM

## 2021-02-10 DIAGNOSIS — F411 Generalized anxiety disorder: Secondary | ICD-10-CM

## 2021-02-10 NOTE — Progress Notes (Signed)
Psychotherapy Progress Note [facility] Richard Moore, PhD LP  Patient ID: [place] [facility] "Richard Wilson")    MRN: [id number] Therapy format: Individual psychotherapy Date: 02/10/2021      Start: 10:18a     Stop: 11:06a     Time Spent: 48 min Location: In-person   Session narrative (presenting needs, interim history, self-report of stressors and symptoms, applications of prior therapy, status changes, and interventions made in session) Had MRI, clean.  Wishes they'd found something, because it would be attention he wants.  Discussed wishes and valid points of evaluation.  Did contact priest at [facility], starting to feel out transferring membership, but then felt awkward about it, figured he must look the fool.  Validated good reasons to explore.  Misses mother, lived with her after father died.  He went on disability 1999/05/01 after several nervous breakdowns and rejoined parents' house in [place].  F died 04-30-2005, A died 05/01/2007, relocated from [place] to [place]) 2012/04/30, in retrospect feels he inconvenienced mother moving her there so he could be in the vicinity of potential mates.  Processed regret evoking mother's voice and perspective as he may be now to forgive himself.  Back story on parents that his father, eldest of 42, lost a lot of money in a bad investment and had to struggle compared to his siblings.  Hard for mother to have been let alone too much when he decided to work, but also recognizes he had to strive hard, and endure putdowns from his siblings.  Affirmed   Has been struggling with impulses to buy and repair jewelry (as "retail therapy").  Succeeding so far in self-restraint, affirmed as good "parenting" and honestly taking care of tomorrow's [facility].    Wishes he could have someone over, share his pictures and stories.   Simultaneously, says he's given up on recruiting new friends.  Encouraged that he can still seek and develop  friendships if he is wiling to brave concerns.  Therapeutic modalities: Cognitive Behavioral Therapy, Solution-Oriented/Positive Psychology, and Psycho-education/Bibliotherapy  Mental Status/Observations:  Appearance:   Casual     Behavior:  Appropriate  Motor:  Normal  Speech/Language:   Clear and Coherent  Affect:  Appropriate  Mood:  anxious and dysthymic  Thought process:  normal  Thought content:    WNL  Sensory/Perceptual disturbances:    WNL  Orientation:  Fully oriented  Attention:  Good    Concentration:  Fair  Memory:  WNL  Insight:    Good  Judgment:   Good  Impulse Control:  Good   Risk Assessment: Danger to Self: No Self-injurious Behavior: No Danger to Others: No Physical Aggression / Violence: No Duty to Warn: No Access to Firearms a concern: No  Assessment of progress:  progressing  Diagnosis:   ICD-10-CM   1. Social anxiety disorder  F40.10     2. Major depressive disorder, recurrent episode, moderate (HCC)  F33.1     3. 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 Mild cognitive impairment of uncertain or unknown etiology (suspect identifed B12, plus effects of anticonvulsant, sedative, anxiety, and vascular aging)  G31.84     5. Generalized anxiety disorder  F41.1      Plan:  Anxiety/discontentment: Refrain from conclusion-jumping and dramatizing  Continue reframing service decisions in terms of values and preferences, not hardships Assertively clarify commitments from others, what help can be had, and terms for his own service Develop social contacts/friendships MCI: Continue lifestyle efforts  to best protect sleep and general health Address B12/B complex deficiency if present Per neuropsych report, look into sleep apnea and need for treatment and baseline MRI, a well as sufficiently cognitively stimulating activities and exercise.  Also begin applying recommendations for how he obtains information to how he will need to advocate for himself with others  where attention, comprehension, or memory are limited. Medication weaning probably still indicated -- most likely clonazepam, on a very gentle taper Process addiction tendencies Continue to refrain from overspending on gold/jewelry, keep within sober budget Porn use is optional -- judge it by its results (soothing or isolating/agitating) Seek other soothers/pleasures before letting himself indulge Other recommendations/advice as may be noted above Continue to utilize previously learned skills ad lib Maintain medication as prescribed and work faithfully with relevant prescriber(s) if any changes are desired or seem indicated Call the clinic on-call service, 988/hotline, 911, or present to [facility] or ER if any life-threatening psychiatric crisis Return in about 2 weeks (around 02/24/2021) for time as available. Already scheduled visit in this office 03/02/2021.  Blanchie Serve, PhD Richard Moore, PhD LP Clinical Psychologist, [facility] [address], [place], [place] [address] [phone]

## 2021-02-11 ENCOUNTER — Other Ambulatory Visit: Payer: Self-pay

## 2021-02-13 ENCOUNTER — Other Ambulatory Visit: Payer: Self-pay

## 2021-02-13 ENCOUNTER — Encounter: Payer: Self-pay | Admitting: Family Medicine

## 2021-02-13 ENCOUNTER — Ambulatory Visit (INDEPENDENT_AMBULATORY_CARE_PROVIDER_SITE_OTHER): Payer: Medicare Other | Admitting: Family Medicine

## 2021-02-13 VITALS — BP 116/68 | HR 89 | Temp 97.7°F | Ht 69.0 in | Wt 166.8 lb

## 2021-02-13 DIAGNOSIS — B356 Tinea cruris: Secondary | ICD-10-CM | POA: Diagnosis not present

## 2021-02-13 DIAGNOSIS — L308 Other specified dermatitis: Secondary | ICD-10-CM | POA: Diagnosis not present

## 2021-02-13 MED ORDER — MOMETASONE FUROATE 0.1 % EX CREA: TOPICAL_CREAM | Freq: Two times a day (BID) | CUTANEOUS | 1 refills | Status: DC

## 2021-02-13 MED ORDER — CLOTRIMAZOLE-BETAMETHASONE 1-0.05 % EX CREA: 1.0000 "application " | TOPICAL_CREAM | Freq: Every day | CUTANEOUS | 0 refills | Status: DC

## 2021-02-13 NOTE — Progress Notes (Signed)
[facility] [address] [place] [address] Dept: [phone] Dept Fax: [phone]  Office Visit  Subjective:    Patient ID: [place], male    DOB: Feb 01, 1951, 71 y.o..   MRN: [id number]  Chief Complaint  Patient presents with   Acute Visit    C/o having rash on LT forearm x 2-3 weeks. Also has  spot on bottom that needs checked out.   Has been using lotion with no relief.    History of Present Illness:  Patient is in today for two rash issues.  He notes a rash on his left forearm for the past 2 weeks. This is pruritic. He initially tried using a moisturizing lotion. As this did not clear it up, he has been applying alcohol.  Additionally, [facility] notes a possible sore on his left upper buttocks. He thinks this may have a scab on it. It is in a place where he cannot see this.  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   Thrombocytopenia 04/08/2020   Generalized anxiety disorder 01/01/2020   Major depressive disorder 01/01/2020   Primary osteoarthritis of both hips 07/30/2019   Chronic kidney disease, stage 3a 03/19/2019   Pure hypercholesterolemia 12/15/2018   Arthritis of knee 08/31/2017   Nephrolithiasis 06/22/2016   Obstructive sleep apnea 04/13/2015   Diverticulosis of large intestine without hemorrhage 09/30/2013   Mild mitral regurgitation 60/63/0160   Diastolic dysfunction 10/93/2355   Elevated PSA 12/22/2012   RBBB (right bundle branch block) 12/03/2012   Past Surgical History:  Procedure Laterality Date   APPENDECTOMY     FOOT TENDON SURGERY Right     TONSILLECTOMY     Family History  Problem Relation Age of Onset   Cancer Mother        Ovarian   Heart disease Mother    Memory loss Mother        with advanced age   Prostate cancer Father    Throat cancer Maternal Grandfather    Heart attack Paternal Grandfather    Cancer Maternal Aunt        Breast, metastatic   Outpatient Medications Prior to Visit  Medication Sig Dispense Refill   aspirin EC 81 MG tablet Take 81 mg by mouth daily. Swallow whole.     BioGaia Probiotic (BIOGAIA/GERBER SOOTHE) LIQD Take 5 drops by mouth daily at 8 pm.     busPIRone (BUSPAR) 30 MG tablet Take 1 tablet (30 mg total) by mouth 2 (two) times daily. 60 tablet 5   clonazePAM (KLONOPIN) 1 MG tablet Take 1 tablet (1 mg total) by mouth 3 (three) times daily as needed for anxiety. 90 tablet 5   lamoTRIgine (LAMICTAL) 150 MG tablet Take 1 tablet (150 mg total) by mouth daily. 30 tablet 5   naproxen (NAPROSYN) 500 MG tablet TAKE 1 TABLET BY MOUTH 2 TIMES DAILY WITH A MEAL. 30 tablet 0   psyllium (METAMUCIL) 58.6 % powder Take 1 packet by mouth in the morning and at bedtime.     sertraline (ZOLOFT) 100 MG tablet Take 2 tablets (200 mg total) by mouth daily. 60 tablet 5   traZODone (DESYREL) 150 MG tablet TAKE  1 TABLET BY MOUTH AT BEDTIME. 90 tablet 0   No facility-administered medications prior to visit.   Allergies  Allergen Reactions   Garamycin [Gentamicin] Other (See Comments)    Eyes itched and were edematous   Penicillins     Pt reports is childhood allergy and does not remember the reaction      Objective:   Today's Vitals   02/13/21 1325  BP: 116/68  Pulse: 89  Temp: 97.7 F (36.5 C)  TempSrc: Temporal  SpO2: 98%  Weight: 166 lb 12.8 oz (75.7 kg)  Height: 5\' 9"  (1.753 m)   Body mass index is 24.63 kg/m.   General: Well developed, well nourished. No acute distress. Skin: Warm and dry. There is a scattering of about six 3-4 mm patches of red, scaly skin on the left   forearm. There is  a dime-sized round lesion on the left upper buttocks. It is mildly red. The border is scaly   in appearance. It does not definitively have central clearing, but no scaliness in the center. Psych: Alert and oriented. Normal mood and affect.  Health Maintenance Due  Topic Date Due   Hepatitis C Screening  Never done   Zoster Vaccines- Shingrix (1 of 2) Never done   Pneumonia Vaccine 97+ Years old (2 - PCV) 03/24/2021     Assessment & Plan:   1. Other eczema The forearm rash appears to be eczematous. I recommend he apply a mid-potency steroid twice a day followed by moisturizing creams.  - mometasone (ELOCON) 0.1 % cream; Apply topically in the morning and at bedtime.  Dispense: 15 g; Refill: 1  2. Tinea cruris The lesion on the buttocks appears to be a tinea infection. As he has some itching, I will prescribe Lotrisone to treat the rash and reducing his itching.  - clotrimazole-betamethasone (LOTRISONE) cream; Apply 1 application topically daily.  Dispense: 30 g; Refill: 0  Richard Salter, MD

## 2021-02-14 ENCOUNTER — Other Ambulatory Visit: Payer: Medicare Other

## 2021-02-25 ENCOUNTER — Ambulatory Visit (INDEPENDENT_AMBULATORY_CARE_PROVIDER_SITE_OTHER): Payer: Medicare Other | Admitting: Psychiatry

## 2021-02-25 ENCOUNTER — Other Ambulatory Visit: Payer: Self-pay

## 2021-02-25 DIAGNOSIS — F331 Major depressive disorder, recurrent, moderate: Secondary | ICD-10-CM | POA: Diagnosis not present

## 2021-02-25 DIAGNOSIS — F4010 Social phobia, unspecified: Secondary | ICD-10-CM | POA: Diagnosis not present

## 2021-02-25 DIAGNOSIS — G3184 Mild cognitive impairment, so stated: Secondary | ICD-10-CM

## 2021-02-25 DIAGNOSIS — F341 Dysthymic disorder: Secondary | ICD-10-CM | POA: Diagnosis not present

## 2021-02-25 DIAGNOSIS — F411 Generalized anxiety disorder: Secondary | ICD-10-CM | POA: Diagnosis not present

## 2021-02-25 NOTE — Progress Notes (Signed)
Psychotherapy Progress Note [facility] [name], PhD LP  Patient ID: [facility] "Richard Wilson")    MRN: [id number] Therapy format: Individual psychotherapy Date: 02/25/2021      Start: 2:22p     Stop: 3:11p     Time Spent: 49 min Location: In-person   Session narrative (presenting needs, interim history, self-report of stressors and symptoms, applications of prior therapy, status changes, and interventions made in session) More depressed of late, more time agonizing, e.g, being in the grocery and feeling like he could barely take another step.  Has taken a Sunday to try another church, and served notice that he will stop [employer] after [place].  (And implication he will leave the congregation, though this has not been articulated).  Embittering more and more about feeling put upon and burning out just giving and being handed responsibilities.  C/o trazodone giving him wild dreams.  Says he is feeling like his meds aren't really doing anything for him any more.  Discussed perceptions and attributions, differentiated how they probably are, just not rescuing him from feeling his actual dilemmas right now, which meds can't really do. Possible he has SSRI poop-out, mot to mention cyclical withdrawal symptoms from 20 years on scheduled Klonopin.    Redirected to psychiatry, but most likely   Therapeutic modalities: Cognitive Behavioral Therapy, Solution-Oriented/Positive Psychology, and Ego-Supportive  Mental Status/Observations:  Appearance:   Casual and Neat     Behavior:  Appropriate  Motor:  Normal  Speech/Language:   Clear and Coherent  Affect:  Appropriate and dramatizing  Mood:  anxious and dysthymic  Thought process:  normal  Thought content:    Rumination  Sensory/Perceptual disturbances:    WNL  Orientation:  Fully oriented  Attention:  Good    Concentration:  Fair  Memory:  WNL  Insight:    Fair  Judgment:   Good  Impulse Control:   Fair   Risk Assessment: Danger to Self: No Self-injurious Behavior: No Danger to Others: No Physical Aggression / Violence: No Duty to Warn: No Access to Firearms a concern: No  Assessment of progress:  stabilized  Diagnosis:   ICD-10-CM   1. Social anxiety disorder  F40.10     2. Major depressive disorder, recurrent episode, moderate (HCC)  F33.1     3. 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 Generalized anxiety disorder  F41.1     5. Mild cognitive impairment of uncertain or unknown etiology (suspect identifed B12, plus effects of anticonvulsant, sedative, anxiety, and vascular aging)  G31.84      Plan:  Recommend vitamin supplementation -- B complex, D3, omega 3 if amenable.   Refer to psychiatry for better determination of med changes.  Endorse Klonopin taper if game for it. Anxiety/discontentment: Refrain from conclusion-jumping and dramatizing  Continue reframing church service decisions in terms of values and preferences, not hardships Assertively clarify commitments from others, what help can be had, and terms for his own service Develop social contacts/friendships MCI: Continue lifestyle efforts to best protect sleep and general health Address B12/B complex deficiency if present Per neuropsych report, look into sleep apnea and need for treatment and baseline MRI, a well as sufficiently cognitively stimulating activities and exercise.  Also begin applying recommendations for how he obtains information to how he will need to advocate for himself with others where attention, comprehension, or memory are limited. Medication weaning probably still indicated -- most likely clonazepam, on a very gentle taper Process  addiction tendencies Continue to refrain from overspending on gold/jewelry, keep within sober budget Porn use is optional -- judge it by its results (soothing or isolating/agitating) Seek other soothers/pleasures before letting himself indulge Other  recommendations/advice as may be noted above Continue to utilize previously learned skills ad lib Maintain medication as prescribed and work faithfully with relevant prescriber(s) if any changes are desired or seem indicated Call the clinic on-call service, 988/hotline, 911, or present to [facility] or ER if any life-threatening psychiatric crisis Return for session(s) already scheduled, needs reschedule with prescriber [facility], had wanted mid-February). Already scheduled visit in this office 03/02/2021.  Blanchie Serve, PhD [name], PhD LP Clinical Psychologist, [facility] [address] [place], [place] [address] [phone]

## 2021-02-27 ENCOUNTER — Other Ambulatory Visit: Payer: Self-pay

## 2021-02-27 ENCOUNTER — Ambulatory Visit (INDEPENDENT_AMBULATORY_CARE_PROVIDER_SITE_OTHER): Payer: Medicare Other | Admitting: Physician Assistant

## 2021-02-27 ENCOUNTER — Encounter: Payer: Self-pay | Admitting: Physician Assistant

## 2021-02-27 ENCOUNTER — Other Ambulatory Visit: Payer: Self-pay | Admitting: Physician Assistant

## 2021-02-27 DIAGNOSIS — Z79899 Other long term (current) drug therapy: Secondary | ICD-10-CM

## 2021-02-27 DIAGNOSIS — F4010 Social phobia, unspecified: Secondary | ICD-10-CM | POA: Diagnosis not present

## 2021-02-27 DIAGNOSIS — F411 Generalized anxiety disorder: Secondary | ICD-10-CM | POA: Diagnosis not present

## 2021-02-27 DIAGNOSIS — F331 Major depressive disorder, recurrent, moderate: Secondary | ICD-10-CM

## 2021-02-27 DIAGNOSIS — F5105 Insomnia due to other mental disorder: Secondary | ICD-10-CM | POA: Diagnosis not present

## 2021-02-27 DIAGNOSIS — F99 Mental disorder, not otherwise specified: Secondary | ICD-10-CM

## 2021-02-27 DIAGNOSIS — G3184 Mild cognitive impairment, so stated: Secondary | ICD-10-CM

## 2021-02-27 MED ORDER — CLONAZEPAM 0.5 MG PO TABS: 0.7500 mg | ORAL_TABLET | Freq: Every day | ORAL | 0 refills | Status: DC

## 2021-02-27 NOTE — Telephone Encounter (Signed)
Has appt with Teresa today 

## 2021-02-27 NOTE — Patient Instructions (Signed)
Starting Mar 04, 2021  Decrease the Klonopin as follows:  Take the Klonopin 1 mg, 1 pill in the morning and 1 pill with supper. Take the Klonopin 0.5 mg, 1.5 pills ( 1 1/2 pill) in the afternoon. (I've sent in a prescription for this today.)  This is decreasing the Klonopin dose by 0.25 mg daily.  Stay on this regimen until our next appointment.

## 2021-02-27 NOTE — Progress Notes (Signed)
[facility]  Patient ID: Richard Wilson,  MRN: [id number]  PCP: Haydee Salter, MD  Date of Evaluation: 02/27/2021  time spent:40 minutes  Chief Complaint:  Chief Complaint   Anxiety; Depression; Insomnia      HISTORY/CURRENT STATUS: HPI For routine med check  Doesn't feel like his meds are doing 'a damned thing.' Wants to get off them all. Asks why should he take them if they're not doing anything. Stays depressed, sad, hopeless that anything will ever get better. Energy and motivation are good. Continues to volunteer at [employer] but very burned out. Feels like he's being taken advantage of. Plans to leave after Easter. Isolates unless it's activities relating to church. Appetite is normal and weight is stable. Says he wishes he 'wasn't here' but has no plans to kill himself. He recently had neuropsychological testing and MRI of brain. He's kind of disappointed they didn't find a brain tumor b/c he could get some sympathy. Also it would help him 'get out of here' sooner. No HI.  Stays anxious, feels panicky about the anxiety even. Wants to get off klonopin, has been on it for years. But very afraid to go off it. At the same time, doesn't think it help.   Patient denies increased energy with decreased need for sleep, no increased talkativeness, no racing thoughts, no impulsivity or risky behaviors, no increased spending, no increased libido, no grandiosity, no increased irritability or anger, and no hallucinations.  Denies dizziness, syncope, seizures, numbness, tingling, tremor, tics, unsteady gait, slurred speech, confusion. Denies muscle or joint pain, stiffness, or dystonia.  Individual Medical History/ Review of Systems: Changes? :No     Past medications for mental health diagnoses include: Rozerem, Trazodone caused vivid dreams, Xanax, Buspar, mirtazapine caused excessive somnolence and fatigue with flulike symptoms, Belsomra, propranolol for tremor, Zoloft, Abilify  caused tremor, Klonopin, Effexor, Ativan, Wellbutrin, Nardil, lithium, gabapentin, Librium   Pertinent info after reviewing Dr. Evelena Leyden notes, his previous psychiatrist in [facility]. Past suicide attempt in September 1996, by taking Tylenol.  He was admitted at that time to a psychiatric hospital in [facility].  Admitted to psych hospital multiple times in the past for "nervous breakdowns."  January 1985, February 1986, October 1991, September 1996, July 2000.  Allergies: Garamycin [gentamicin] and Penicillins  Current Medications:  Current Outpatient Medications:    aspirin EC 81 MG tablet, Take 81 mg by mouth daily. Swallow whole., Disp: , Rfl:    BioGaia Probiotic (BIOGAIA/GERBER SOOTHE) LIQD, Take 5 drops by mouth daily at 8 pm., Disp: , Rfl:    busPIRone (BUSPAR) 30 MG tablet, Take 1 tablet (30 mg total) by mouth 2 (two) times daily., Disp: 60 tablet, Rfl: 5   [START ON 03/03/2021] clonazePAM (KLONOPIN) 0.5 MG tablet, Take 1.5 tablets (0.75 mg total) by mouth daily in the afternoon. Take along with the Klonopin 1 mg that he has, one every morning, and one at supper., Disp: 45 tablet, Rfl: 0   clotrimazole-betamethasone (LOTRISONE) cream, Apply 1 application topically daily., Disp: 30 g, Rfl: 0   lamoTRIgine (LAMICTAL) 150 MG tablet, Take 1 tablet (150 mg total) by mouth daily., Disp: 30 tablet, Rfl: 5   mometasone (ELOCON) 0.1 % cream, Apply topically in the morning and at bedtime., Disp: 15 g, Rfl: 1   naproxen (NAPROSYN) 500 MG tablet, TAKE 1 TABLET BY MOUTH 2 TIMES DAILY WITH A MEAL., Disp: 30 tablet, Rfl: 0   psyllium (METAMUCIL) 58.6 % powder, Take 1 packet  by mouth in the morning and at bedtime., Disp: , Rfl:    sertraline (ZOLOFT) 100 MG tablet, Take 2 tablets (200 mg total) by mouth daily., Disp: 60 tablet, Rfl: 5   traZODone (DESYREL) 150 MG tablet, TAKE 1 TABLET BY MOUTH AT BEDTIME., Disp: 90 tablet, Rfl: 0   clonazePAM (KLONOPIN) 1 MG tablet,  Take 1 tablet (1 mg total) by mouth 2 (two) times daily. Plus the 0.5 mg pill, 1.5 pills (0.75 mg) mid-day., Disp: 60 tablet, Rfl: 1 Medication Side Effects: sexual dysfunction  Family Medical/ Social History: Changes?  no  MENTAL HEALTH EXAM:  There were no vitals taken for this visit.There is no height or weight on file to calculate BMI.  General Appearance: Casual and Well Groomed  Eye Contact:  Good  Speech:  Clear and Coherent, Normal Rate, and Talkative  Volume:  Increased  Mood:  Euthymic  Affect:  Congruent  Thought Process:  Goal Directed and Descriptions of Associations: Circumstantial  Orientation:  Full (Time, Place, and Person)  Thought Content: Logical and Rumination   Suicidal Thoughts:  Yes.  without intent/plan  Homicidal Thoughts:  No  Memory:  WNL  Judgement:  Good  Insight:  Good  Psychomotor Activity:  Normal  Concentration:  Concentration: Good and Attention Span: Fair  Recall:  Good  Fund of Knowledge: Good  Language: Good  Assets:  Desire for Improvement  ADL's:  Intact  Cognition: WNL  Prognosis:  Good   02/09/2021 MRI brain, no intracranial abnormality  Reviewed Dr Doristine Devoid and Dr. Gustavus Bryant notes. Also neuro psych test results. See on chart.  DIAGNOSES:    ICD-10-CM   1. Major depressive disorder, recurrent episode, moderate (HCC)  F33.1     2. Social anxiety disorder  F40.10     3. Insomnia due to other mental disorder  F51.05    F99     4. Generalized anxiety disorder  F41.1     5. Long-term current use of benzodiazepine  Z79.899     6. Mild cognitive impairment of uncertain or unknown etiology  G31.84         Receiving Psychotherapy: Yes    Dr. Jonni Sanger Mitchum.  Therapy is going well.   RECOMMENDATIONS:  PDMP was reviewed.  Klonopin last filled 02/13/2021. I provided 40 minutes of face to face time during this encounter, including time spent before and after the visit in records review, medical decision making, counseling pertinent to  today's visit, and charting.  Reviewed neuro visit, MRI, neuropsych testing and results and discussed briefly with him. BZ can be a factor in decreased cognition.  I understand that he wants to get off, or at least decrease dose, of some of his meds. But my recommendation is to only tackle one at a time, decreasing Klonopin first. Will decrease by 0.25 mg daily for at least 2 weeks (he prefers to go a month which is fine) before decreasing again. We may need to go down by 0.125 mg at a time but will be more difficult to cut pills that way. I wrote instructions on AVS, plus went over them in detail with him, on how to decrease the dose. He verbalizes understanding. He will call if unable to tolerate (dizziness, increased anxiety, jitteriness.) For now, no other changes will be made.  Contract for safety in place. Call 988, go to [place] urgent care or call our office if SI worsen. Continue Buspar 30 mg, 1 po bid.  Decrease Klonopin 1  mg, 1 po bid. PLUS 0.5 mg, 1.5 pills (0.75 mg) in mid-day, beginning 03/04/2021, at his request. Current dose is 1 mg, tid.  (Refill Rx for 1mg  #90 canceled and new Rx for #60 sent) Continue Lamictal 150 mg, qd. Continue Zoloft 100 mg  2 p.o. daily. Continue trazodone 150 mg 1 p.o. nightly as needed. Continue counseling with Dr. [name]. Return in 4 weeks.  Donnal Moat, PA-C

## 2021-03-01 MED ORDER — CLONAZEPAM 1 MG PO TABS: 1.0000 mg | ORAL_TABLET | Freq: Two times a day (BID) | ORAL | 1 refills | Status: DC

## 2021-03-02 ENCOUNTER — Other Ambulatory Visit: Payer: Self-pay

## 2021-03-02 ENCOUNTER — Ambulatory Visit (INDEPENDENT_AMBULATORY_CARE_PROVIDER_SITE_OTHER): Payer: Medicare Other | Admitting: Psychiatry

## 2021-03-02 DIAGNOSIS — F331 Major depressive disorder, recurrent, moderate: Secondary | ICD-10-CM | POA: Diagnosis not present

## 2021-03-02 DIAGNOSIS — F5105 Insomnia due to other mental disorder: Secondary | ICD-10-CM

## 2021-03-02 DIAGNOSIS — F4010 Social phobia, unspecified: Secondary | ICD-10-CM

## 2021-03-02 DIAGNOSIS — F99 Mental disorder, not otherwise specified: Secondary | ICD-10-CM

## 2021-03-02 DIAGNOSIS — F411 Generalized anxiety disorder: Secondary | ICD-10-CM | POA: Diagnosis not present

## 2021-03-02 DIAGNOSIS — G3184 Mild cognitive impairment, so stated: Secondary | ICD-10-CM

## 2021-03-02 DIAGNOSIS — F341 Dysthymic disorder: Secondary | ICD-10-CM | POA: Diagnosis not present

## 2021-03-02 DIAGNOSIS — Z79899 Other long term (current) drug therapy: Secondary | ICD-10-CM
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
## 2021-03-06 ENCOUNTER — Other Ambulatory Visit: Payer: Self-pay

## 2021-03-06 ENCOUNTER — Ambulatory Visit (INDEPENDENT_AMBULATORY_CARE_PROVIDER_SITE_OTHER): Payer: Medicare Other | Admitting: Psychiatry

## 2021-03-06 DIAGNOSIS — F4010 Social phobia, unspecified: Secondary | ICD-10-CM

## 2021-03-06 DIAGNOSIS — G3184 Mild cognitive impairment, so stated: Secondary | ICD-10-CM

## 2021-03-06 DIAGNOSIS — F411 Generalized anxiety disorder: Secondary | ICD-10-CM | POA: Diagnosis not present

## 2021-03-06 DIAGNOSIS — F331 Major depressive disorder, recurrent, moderate: Secondary | ICD-10-CM

## 2021-03-06 DIAGNOSIS — F341 Dysthymic disorder: Secondary | ICD-10-CM | POA: Diagnosis not present

## 2021-03-06 DIAGNOSIS — Z79899 Other long term (current) drug therapy: Secondary | ICD-10-CM
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
## 2021-03-13 ENCOUNTER — Ambulatory Visit (INDEPENDENT_AMBULATORY_CARE_PROVIDER_SITE_OTHER): Payer: Medicare Other | Admitting: Psychiatry

## 2021-03-13 ENCOUNTER — Other Ambulatory Visit: Payer: Self-pay

## 2021-03-13 DIAGNOSIS — F331 Major depressive disorder, recurrent, moderate: Secondary | ICD-10-CM

## 2021-03-13 DIAGNOSIS — Z79899 Other long term (current) drug therapy: Secondary | ICD-10-CM

## 2021-03-13 DIAGNOSIS — F341 Dysthymic disorder: Secondary | ICD-10-CM | POA: Diagnosis not present

## 2021-03-13 DIAGNOSIS — G3184 Mild cognitive impairment, so stated: Secondary | ICD-10-CM

## 2021-03-13 DIAGNOSIS — F4010 Social phobia, unspecified: Secondary | ICD-10-CM | POA: Diagnosis not present

## 2021-03-13 DIAGNOSIS — F411 Generalized anxiety disorder: Secondary | ICD-10-CM
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
## 2021-03-20 ENCOUNTER — Telehealth: Payer: Self-pay | Admitting: Physician Assistant

## 2021-03-20 ENCOUNTER — Ambulatory Visit (INDEPENDENT_AMBULATORY_CARE_PROVIDER_SITE_OTHER): Payer: Medicare Other | Admitting: Psychiatry

## 2021-03-20 ENCOUNTER — Other Ambulatory Visit: Payer: Self-pay

## 2021-03-20 ENCOUNTER — Other Ambulatory Visit: Payer: Self-pay | Admitting: Physician Assistant

## 2021-03-20 DIAGNOSIS — Z79899 Other long term (current) drug therapy: Secondary | ICD-10-CM

## 2021-03-20 DIAGNOSIS — F411 Generalized anxiety disorder: Secondary | ICD-10-CM | POA: Diagnosis not present

## 2021-03-20 DIAGNOSIS — F4010 Social phobia, unspecified: Secondary | ICD-10-CM | POA: Diagnosis not present

## 2021-03-20 DIAGNOSIS — F331 Major depressive disorder, recurrent, moderate: Secondary | ICD-10-CM | POA: Diagnosis not present

## 2021-03-20 DIAGNOSIS — F341 Dysthymic disorder: Secondary | ICD-10-CM

## 2021-03-20 DIAGNOSIS — G3184 Mild cognitive impairment, so stated: Secondary | ICD-10-CM

## 2021-03-20 MED ORDER — CLONIDINE HCL 0.1 MG PO TABS: ORAL_TABLET | ORAL | 1 refills | Status: DC

## 2021-03-20 NOTE — Telephone Encounter (Signed)
For my part, I think clonidine would probably help cover withdrawal.  -- AM

## 2021-03-20 NOTE — Telephone Encounter (Signed)
Richard Wilson is in the office now to see [place].  At check in he requested for Helene Kelp to prescribe him a prescription for withdrawal symptoms.  Please send to [facility] on [address], [place].  If you have questions for him, he is here until he finishes with Dr. [name] about 11am.

## 2021-03-20 NOTE — Telephone Encounter (Signed)
Patient notified of recommendations. 

## 2021-03-20 NOTE — Telephone Encounter (Signed)
Please call and let him know that I sent in clonidine.  It is a medicine originally given for blood pressure so he will need to let us know if he gets dizzy or feels faint.  He will take 1 pill in the evening only for 2 nights, then increase to 1 p.o. twice daily.  He should get up slowly from a lying or sitting position to make sure he does not get hypotensive.  It will help as we are gradually weaning off the benzo.  Thank you

## 2021-03-20 NOTE — Telephone Encounter (Signed)
Please see phone message
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
## 2021-03-27 ENCOUNTER — Ambulatory Visit: Payer: Medicare Other | Admitting: Psychiatry

## 2021-03-30 ENCOUNTER — Other Ambulatory Visit: Payer: Self-pay

## 2021-03-30 ENCOUNTER — Ambulatory Visit: Payer: Medicare Other | Admitting: Physician Assistant

## 2021-03-31 ENCOUNTER — Telehealth: Payer: Self-pay | Admitting: Physician Assistant

## 2021-03-31 ENCOUNTER — Ambulatory Visit (INDEPENDENT_AMBULATORY_CARE_PROVIDER_SITE_OTHER): Payer: Medicare Other | Admitting: Family Medicine

## 2021-03-31 ENCOUNTER — Encounter: Payer: Self-pay | Admitting: Family Medicine

## 2021-03-31 VITALS — BP 114/68 | HR 60 | Temp 97.6°F | Ht 69.0 in | Wt 161.0 lb

## 2021-03-31 DIAGNOSIS — F411 Generalized anxiety disorder: Secondary | ICD-10-CM | POA: Diagnosis not present

## 2021-03-31 DIAGNOSIS — Z23 Encounter for immunization: Secondary | ICD-10-CM

## 2021-03-31 DIAGNOSIS — F331 Major depressive disorder, recurrent, moderate: Secondary | ICD-10-CM

## 2021-03-31 DIAGNOSIS — R972 Elevated prostate specific antigen [PSA]: Secondary | ICD-10-CM

## 2021-03-31 LAB — PSA: PSA: 6.01 ng/mL — ABNORMAL HIGH (ref 0.10–4.00)

## 2021-03-31 NOTE — Telephone Encounter (Signed)
Pt called and said that the clonidine he is taking he is having a dry mouth that he can't swallow. He said he is drinking all the time. He doesn't know if he should take the medicine at breakfast and dinner.? Please call him at [phone]

## 2021-03-31 NOTE — Telephone Encounter (Signed)
Is it helping with the symptoms he's having due to decreasing the dose of his benzo?  If it is helping, have him stay on it.  Recommend ACT lozenges or gum which helps with the dry mouth, and of course keep drinking a lot of water.  If it is not helping the symptoms he can stop it.  No need to wean off.

## 2021-03-31 NOTE — Progress Notes (Signed)
[facility] [address] [place] [address] Dept: [phone] Dept Fax: [phone]  Chronic Care Office Visit  Subjective:    Patient ID: Richard Wilson, male    DOB: December 12, 1950, 71 y.o..   MRN: [id number]  Chief Complaint  Patient presents with   Follow-up    3 month f/u.  C/O lack of appetite.       History of Present Illness:  Patient is in today for reassessment of chronic medical issues.  Mr. Tammen has a history of generalized anxiety and depression. He is managed by Blanchie Serve, PhD  (psychology) and Donnal Moat (Psych PA) for therapy. He is currently taking buspirone, clonazepam, clonidine, lamotrigine, sertraline, and trazodone. Mr. Duque notes that he is currently undergoing a taper related to his Klonopin. He is on 1 mg morning and evening and now 0.75 mg midday. He has noted dry mouth as a problem since the dosage reduction. He also feels his depression is worse overall. He has noted associated decreased appetite. He does have follow-ups established with his behavioral health clinicians.  Mr. Lowrey admits that he has fallen off of his usual exercise. He had been getting out for morning walks twice a week, but not recently.  Mr. Fayson has a history of an elevated PSA. He does not have significant LUTS.  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   Thrombocytopenia 04/08/2020   Generalized anxiety disorder 01/01/2020   Major depressive disorder 01/01/2020   Primary osteoarthritis of both hips 07/30/2019   Chronic kidney disease, stage 3a 03/19/2019   Hyperlipidemia  12/15/2018   Arthritis of knee 08/31/2017   Nephrolithiasis 06/22/2016   Obstructive sleep apnea 04/13/2015   Diverticulosis of large intestine without hemorrhage 09/30/2013   Mild mitral regurgitation 90/24/0973   Diastolic dysfunction 53/29/9242   Elevated PSA 12/22/2012   RBBB (right bundle branch block) 12/03/2012   Past Surgical History:  Procedure Laterality Date   APPENDECTOMY     FOOT TENDON SURGERY Right    TONSILLECTOMY     Family History  Problem Relation Age of Onset   Cancer Mother        Ovarian   Heart disease Mother    Memory loss Mother        with advanced age   Prostate cancer Father    Throat cancer Maternal Grandfather    Heart attack Paternal Grandfather    Cancer Maternal Aunt        Breast, metastatic   Outpatient Medications Prior to Visit  Medication Sig Dispense Refill   aspirin EC 81 MG tablet Take 81 mg by mouth daily. Swallow whole.     BioGaia Probiotic (BIOGAIA/GERBER SOOTHE) LIQD Take 5 drops by mouth daily at 8 pm.     busPIRone (BUSPAR) 30 MG tablet TAKE 1 TABLET BY MOUTH 2 TIMES DAILY. 180 tablet 1   clonazePAM (KLONOPIN) 0.5 MG tablet Take 1.5 tablets (0.75 mg total) by mouth daily in the afternoon. Take along with the Klonopin 1 mg that he has, one every morning, and one at supper. 45 tablet 0   clonazePAM (KLONOPIN) 1 MG tablet Take 1 tablet (1 mg total) by mouth 2 (two)  times daily. Plus the 0.5 mg pill, 1.5 pills (0.75 mg) mid-day. 60 tablet 1   cloNIDine (CATAPRES) 0.1 MG tablet 1 p.o. nightly for 2 nights then increase to 1 p.o. twice daily. 60 tablet 1   clotrimazole-betamethasone (LOTRISONE) cream Apply 1 application topically daily. 30 g 0   lamoTRIgine (LAMICTAL) 150 MG tablet Take 1 tablet (150 mg total) by mouth daily. 30 tablet 5   mometasone (ELOCON) 0.1 % cream Apply topically in the morning and at bedtime. 15 g 1   naproxen (NAPROSYN) 500 MG tablet TAKE 1 TABLET BY MOUTH 2 TIMES DAILY WITH A MEAL. 30 tablet 0   psyllium  (METAMUCIL) 58.6 % powder Take 1 packet by mouth in the morning and at bedtime.     sertraline (ZOLOFT) 100 MG tablet TAKE 2 TABLETS BY MOUTH EVERY DAY 180 tablet 1   traZODone (DESYREL) 150 MG tablet TAKE 1 TABLET BY MOUTH AT BEDTIME. 90 tablet 0   No facility-administered medications prior to visit.   Allergies  Allergen Reactions   Garamycin [Gentamicin] Other (See Comments)    Eyes itched and were edematous   Penicillins     Pt reports is childhood allergy and does not remember the reaction     Objective:   Today's Vitals   03/31/21 1015  BP: 114/68  Pulse: 60  Temp: 97.6 F (36.4 C)  TempSrc: Temporal  SpO2: 97%  Weight: 161 lb (73 kg)  Height: 5\' 9"  (1.753 m)   Body mass index is 23.78 kg/m.   General: Well developed, well nourished. No acute distress. Psych: Alert and oriented. Normal mood and affect.  Health Maintenance Due  Topic Date Due   Hepatitis C Screening  Never done   Zoster Vaccines- Shingrix (1 of 2) Never done   Pneumonia Vaccine 70+ Years old (2 - PCV) 03/24/2021     Depression screen PHQ 2/9 03/31/2021 09/23/2020  Decreased Interest 1 2  Down, Depressed, Hopeless 3 3  PHQ - 2 Score 4 5  Altered sleeping 2 0  Tired, decreased energy 3 1  Change in appetite 2 1  Feeling bad or failure about yourself  2 3  Trouble concentrating 0 0  Moving slowly or fidgety/restless 0 0  Suicidal thoughts 0 0  PHQ-9 Score 13 10  Difficult doing work/chores Somewhat difficult Very difficult  Some encounter information is confidential and restricted. Go to Review Flowsheets activity to see all data.   GAD 7 : Generalized Anxiety Score 03/31/2021 09/23/2020  Nervous, Anxious, on Edge 2 3  Control/stop worrying 3 3  Worry too much - different things 2 3  Trouble relaxing 0 2  Restless 2 0  Easily annoyed or irritable 0 1  Afraid - awful might happen 0 3  Total GAD 7 Score 9 15  Anxiety Difficulty Somewhat difficult Somewhat difficult  Some encounter  information is confidential and restricted. Go to Review Flowsheets activity to see all data.   Lab Results: Lab Results  Component Value Date   PSA 8.50 (H) 09/24/2020   Assessment & Plan:   1. Generalized anxiety disorder 2. Moderate episode of recurrent major depressive disorder ([place]) PHQ and GAD7 do not demonstrate significant worsening of mood. However, Mr. Wageman weight is down about 5 lbs over the past 6 weeks. As he has follow-ups established with his behavioral health providers, I will leave it to them to manage this.  3. Elevated PSA We are due to repeat the his PSA level, as his  last was elevated. If this remains elevated or increasing, I will plan a referral to urology for assessment.  - PSA  4. Need for pneumococcal vaccination  - Pneumococcal conjugate vaccine 13-valent IM  Return in about 6 months (around 09/28/2021) for Reassessment.   Haydee Salter, MD

## 2021-03-31 NOTE — Telephone Encounter (Signed)
Pt stated he has been taking 1 in the morning and 1 at supper for about a week.He has dry mouth all day long and wants to know if it will go away,or if he could stop med

## 2021-03-31 NOTE — Telephone Encounter (Signed)
Pt stated he would rather stop,he does not see that its helping right now

## 2021-03-31 NOTE — Telephone Encounter (Signed)
Ok

## 2021-04-03 ENCOUNTER — Ambulatory Visit (INDEPENDENT_AMBULATORY_CARE_PROVIDER_SITE_OTHER): Payer: Medicare Other | Admitting: Psychiatry

## 2021-04-03 ENCOUNTER — Other Ambulatory Visit: Payer: Self-pay

## 2021-04-03 DIAGNOSIS — F341 Dysthymic disorder: Secondary | ICD-10-CM

## 2021-04-03 DIAGNOSIS — F411 Generalized anxiety disorder: Secondary | ICD-10-CM | POA: Diagnosis not present

## 2021-04-03 DIAGNOSIS — F331 Major depressive disorder, recurrent, moderate: Secondary | ICD-10-CM | POA: Diagnosis not present

## 2021-04-03 DIAGNOSIS — G3184 Mild cognitive impairment, so stated: Secondary | ICD-10-CM

## 2021-04-03 DIAGNOSIS — F4010 Social phobia, unspecified: Secondary | ICD-10-CM

## 2021-04-03 NOTE — Progress Notes (Signed)
Psychotherapy Progress Note ?[facility] ?Richard Moore, PhD LP ? ?Patient ID: Richard Wilson [facility] "Richard Wilson")    MRN: [id number] ?Therapy format: Individual psychotherapy ?Date: 04/03/2021      Start: 10:20a     Stop: 11:10a     Time Spent: 50 min ?Location: In-person  ? ?Session narrative (presenting needs, interim history, self-report of stressors and symptoms, applications of prior therapy, status changes, and interventions made in session) ?Initially standing and pacing.  Joined in standing for nonverbal validation.  Can't abide clonidine due to dry mouth.  Suggested it could be the formulation and possibly alternative alpha blockers might help, check with psychiatry.  Feels Zoloft is doing nothing any more, though this is probably more to do with the fact -- or perception -- of Klonopin withdrawal. ? ?Has officially quit the church now, can't abide the threat of feeling dragged into service and feeling taken for granted, at a breaking point, resentment is too strong now.  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  Able to notice and acknowledge both Richard Wilson and Richard Wilson were kind and receptive about it.  Validated that he did the upstanding thing telling them, and nobody in the picture "pulled a Richard Wilson" (coercive priest mentioned last week, who one tried to teach him that lesson by bullying him into explaining why he quit a job). ? ?Been watching Holocaust movies a lot lately.  Seems to feel a commonality with suffering and persecuted Jews.  Did not challenge motives, will allow him to work it out of his system. ? ?Piqued that sister has not acknowledged or thanked him for the earrings he sent her.  Feels bound to go engage in "retail therapy" (pawn shop cruising), financial consequences be damned.  Allowed that he may, but it would be worth his while not to set up regrets later by overspending  today. ? ?Confesses his sleep is not restful enough.  Low appetite, and only ice cream tastes any good.  Queried about alcohol, very much against drinking and is not.  "Retail therapy" and gay porn seem to be his addictive places.  Validated that he probably is feeling considerable grief at not being able to make his intended adjustment to this church, and it is not turning out to be the prosthetic family he wanted it to be.  Also probably more of an ordeal than said working himself up to break the news to Richard Wilson.  Validated his right to be grieves and tired, and when ready, can see about sampling other churches.  Considering returning to the [employer].  Briefly discussed alternative [employer] congregations.  Knows he would not like to attend with male clergy. ? ?Good portion of the session spent releasing anxious energy.  Grateful for the kindness, validation, and reframing toward hope an motivation. ? ?Therapeutic modalities: Cognitive Behavioral Therapy, Solution-Oriented/Positive Psychology, and Ego-Supportive ? ?Mental Status/Observations: ? ?Appearance:   Neat     ?Behavior:  Less agitated  ?Motor:  Less restless  ?Speech/Language:   Clear and Coherent  ?Affect:  Appropriate, nondramatic  ?Mood:  anxious and dysthymic  ?Thought process:  normal  ?Thought content:    Rumination  ?Sensory/Perceptual disturbances:    WNL  ?Orientation:  Fully oriented  ?Attention:  Good  ?  ?Concentration:  Good  ?Memory:  WNL  ?Insight:    Good  ?Judgment:   Good  ?Impulse  Control:  Good  ? ?Risk Assessment: ?Danger to Self: No Self-injurious Behavior: No ?Danger to Others: No Physical Aggression / Violence: No ?Duty to Warn: No Access to Firearms a concern: No ? ?Assessment of progress:  stabilized ? ?Diagnosis: ?  ICD-10-CM   ?1. Social anxiety disorder  F40.10   ?  ?2. Major depressive disorder, recurrent episode, moderate (HCC)  F33.1   ?  ?3. Generalized anxiety disorder  F41.1   ?  ?4. Mild cognitive  impairment of uncertain or unknown etiology -- suspect combination of anxiety, sedative effects, and dyssomnia  G31.84   ?  ?5. 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  ? ?Plan:  ?Self-affirm well done having the awkward conversation with church leaders ?Encourage seek another place of worship soon as ready, as he deserves to see the next good opportunity to relate and integrate into a community ?Check with psychiatry: ?Non-drying alternative to clonidine ?Updated alternative to Zoloft -- possibly Viibryd, possibly Remeron ?Re-evaluate value of BuSpar ?Genetic testing ?Encourage ask sister if she got/liked the gift earrings -- or not, his prerogative, just ensure he doesn't silently resent it without fact-checking ?Discourage "retail therapy today" -- a spending "bender" will not help anything ?Other recommendations/advice as may be noted above ?Continue to utilize previously learned skills ad lib ?Maintain medication as prescribed and work faithfully with relevant prescriber(s) if any changes are desired or seem indicated ?Call the clinic on-call service, 988/hotline, 911, or present to Richard Wilson or ER if any life-threatening psychiatric crisis ?Return for session(s) already scheduled. ?Already scheduled visit in this office 04/10/2021. ? ?Richard Serve, PhD ?Richard Moore, PhD LP ?Clinical Psychologist, [facility] ?[facility] ?[address] ?[place], [place] [address] ?(o) [phone] ?

## 2021-04-10 ENCOUNTER — Ambulatory Visit (INDEPENDENT_AMBULATORY_CARE_PROVIDER_SITE_OTHER): Payer: Medicare Other | Admitting: Psychiatry

## 2021-04-10 ENCOUNTER — Other Ambulatory Visit: Payer: Self-pay

## 2021-04-10 DIAGNOSIS — F331 Major depressive disorder, recurrent, moderate: Secondary | ICD-10-CM

## 2021-04-10 DIAGNOSIS — F411 Generalized anxiety disorder: Secondary | ICD-10-CM | POA: Diagnosis not present

## 2021-04-10 DIAGNOSIS — G3184 Mild cognitive impairment, so stated: Secondary | ICD-10-CM

## 2021-04-10 DIAGNOSIS — F341 Dysthymic disorder: Secondary | ICD-10-CM | POA: Diagnosis not present

## 2021-04-10 DIAGNOSIS — F4010 Social phobia, unspecified: Secondary | ICD-10-CM

## 2021-04-10 DIAGNOSIS — Z79899 Other long term (current) drug therapy: Secondary | ICD-10-CM

## 2021-04-10 NOTE — Progress Notes (Signed)
Psychotherapy Progress Note ?[facility] ?Richard Moore, PhD LP ? ?Patient ID: Richard Wilson [facility] [name])    MRN: [id number] ?Therapy format: Individual psychotherapy ?Date: 04/10/2021      Start: 11:07a     Stop: 11:58a     Time Spent: 51 min ?Location: In-person  ? ?Session narrative (presenting needs, interim history, self-report of stressors and symptoms, applications of prior therapy, status changes, and interventions made in session) ?Asks whether I got in touch with his psychiatrist about offering an alternative to clonidine -- thought I was going to run the errand for him, while I thought I'd been clear enough that I can endorse it but he should ask.  Scheduled Tuesday.  Discussed his sense of suffering right now, which he largely attributes to very mild Klonopin reduction.  Nudged him to think through recent themes in our conversations, recapture the recognition that he is grieving the failure of his dream to join [facility] [employer] [facility] [employer] and have a sense of family belonging he misses with his mother and maybe has never quite felt out in the world throughout his adult life.  Feeling guilty about leaving now.  Validated choice if he wants to reverse himself but quickly says he could never go back now, presumably after embarrassing himself.  Assured that any church worth its salt would accept if he does, but it's don't-have-to either way.  Also guessed correctly that he is down on himself for not being hardier about it, repeating past themes of failure and fear of relapsing into suicidal depressions past.  Clear that he is not suicidal now, just more judgmental of self.  Recalls h/o coming to tears one morning in his mid-40s, after a valued boss left in disgrace and his replacement established herself as cold and unhelpful, calling off his job at [employer] in [place], and his mother telling him to not even pack, just get in the car and come home (for fear he would suicide if he tarried).   Acknowledged her deep caring and encouraged in doing for himself as she would.   ? ?Re. church, began visiting [place], for 2 Sundays and a Thursday noon.  Has called today to be on the information distribution.  Affirmed as 4 acts of things that depression itself cannot do -- life force does.  Admits rehashing regrets over mother's end of life.  Validated OK to feel them, but what would she say if she could join [place]?  Forgiven?  No longer matter?  Readily enough aware that she wants her son to be well, do well, be taken care of well, so reframed these and other acts as doing just that, in her stead.   ? ?Still tended to mull over feeling bad, as if nothing will ever get any better, but noted ho he brightened with change of subject, proposed that maybe one thing he really needs is to recognize ruminating and change focus.  Validated how he got one lift out of selling some silver he no longer wants and bought something he does, and not at a bank-breaking price.  Affirmed adult prerogative to choose how he spends his money as well as what conditions he gives himself to live with.  Noted in story of getting himself to clean up some at home how he did battle with all-or-none thinking, affirming both the cleaning and the anti-perfectionism as acts of life, not depression, and to self-affirm as such. ? ?Therapeutic modalities: Cognitive Behavioral Therapy, Solution-Oriented/Positive Psychology, Ego-Supportive, and Humanistic/Existential ? ?  Mental Status/Observations: ? ?Appearance:   Casual, brooch pinned to sweatshirt  ?Behavior:  Appropriate  ?Motor:  Normal  ?Speech/Language:   Clear and Coherent  ?Affect:  Appropriate  ?Mood:  sad  ?Thought process:  normal  ?Thought content:    Rumination  ?Sensory/Perceptual disturbances:    WNL  ?Orientation:  Fully oriented  ?Attention:  Good  ?  ?Concentration:  Good  ?Memory:  WNL  ?Insight:    Good  ?Judgment:   Good  ?Impulse Control:  Fair  ? ?Risk  Assessment: ?Danger to Self: No Self-injurious Behavior: No ?Danger to Others: No Physical Aggression / Violence: No ?Duty to Warn: No Access to Firearms a concern: No ? ?Assessment of progress:  stabilized ? ?Diagnosis: ?  ICD-10-CM   ?1. Major depressive disorder, recurrent episode, moderate (HCC)  F33.1   ?  ?2. Social anxiety disorder  F40.10   ?  ?3. Generalized anxiety disorder  F41.1   ?  ?4. 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  ?5. Long-term current use of benzodiazepine  Z79.899   ?  ?6. Mild cognitive impairment of uncertain or unknown etiology -- suspect combination of anxiety, sedative effects, and dyssomnia  G31.84   ?  ? ?Plan:  ? ?Self-affirm felt depression right now is mostly grief, partly self-judgment, both in need of validation, doing what helps, and be ing content more than agonizing ?Self-affirm continuing prerogative to affiliate with new church, still honorable how he parted with [facility], and living, not depressing, to be on about those things ?Self-affirm other aspects of self-care as continuing mother's mission an doing what depression does not ?Check with psychiatry: ?Non-drying alternative to clonidine ?Updated alternative to Zoloft -- possibly Viibryd, possibly Remeron ?Re-evaluate value of BuSpar ?Genetic testing ?Discourage "retail therapy" but respect his prerogative as an adult to choose, judge value, reckon with the consequences, and figure out how to pay for things he deems valuable enough ?Other recommendations/advice as may be noted above ?Continue to utilize previously learned skills ad lib ?Maintain medication as prescribed and work faithfully with relevant prescriber(s) if any changes are desired or seem indicated ?Call the clinic on-call service, 988/hotline, 911, or present to [facility] or ER if any life-threatening psychiatric crisis ?No follow-ups on file. ?Already scheduled visit in this office 04/13/2021. ? ?Richard Serve, PhD ?Richard Moore, PhD LP ?Clinical Psychologist,  [facility] ?[facility] ?[address], [address] ?[place], [place] [address] ?(o) [phone] ?

## 2021-04-12 ENCOUNTER — Other Ambulatory Visit: Payer: Self-pay | Admitting: Physician Assistant

## 2021-04-13 ENCOUNTER — Encounter: Payer: Self-pay | Admitting: Physician Assistant

## 2021-04-13 ENCOUNTER — Ambulatory Visit (INDEPENDENT_AMBULATORY_CARE_PROVIDER_SITE_OTHER): Payer: Medicare Other | Admitting: Physician Assistant

## 2021-04-13 ENCOUNTER — Other Ambulatory Visit: Payer: Self-pay

## 2021-04-13 DIAGNOSIS — F99 Mental disorder, not otherwise specified: Secondary | ICD-10-CM

## 2021-04-13 DIAGNOSIS — F331 Major depressive disorder, recurrent, moderate: Secondary | ICD-10-CM | POA: Diagnosis not present

## 2021-04-13 DIAGNOSIS — F4010 Social phobia, unspecified: Secondary | ICD-10-CM | POA: Diagnosis not present

## 2021-04-13 DIAGNOSIS — F411 Generalized anxiety disorder: Secondary | ICD-10-CM | POA: Diagnosis not present

## 2021-04-13 DIAGNOSIS — F5105 Insomnia due to other mental disorder: Secondary | ICD-10-CM | POA: Diagnosis not present

## 2021-04-13 DIAGNOSIS — R6889 Other general symptoms and signs: Secondary | ICD-10-CM

## 2021-04-13 MED ORDER — CLONAZEPAM 1 MG PO TABS: ORAL_TABLET | ORAL | 1 refills | Status: DC

## 2021-04-13 MED ORDER — FLUVOXAMINE MALEATE 100 MG PO TABS: ORAL_TABLET | ORAL | 1 refills | Status: DC

## 2021-04-13 NOTE — Patient Instructions (Signed)
Stop Trazodone. ? ?Wean off Zoloft 100 mg by taking 1 pill every day for [redacted] week along with the Luvox 100 mg 1 at betime for 1 week.  Then stop the Zoloft and increase the Luvox 100 mg to 2 pills every night. ? ?Continue Buspar, Klonopin, and Lamictal as directed.  ?

## 2021-04-13 NOTE — Progress Notes (Signed)
[facility] ? ?Patient ID: Richard Wilson,  ?MRN: [id number] ? ?PCP: Haydee Salter, MD ? ?Date of Evaluation: 04/13/2021 ? time spent:40 minutes ? ?Chief Complaint:  ?Chief Complaint   ?Anxiety; Depression; Insomnia; Follow-up ?  ? ? ? ?HISTORY/CURRENT STATUS: ?HPI For routine med check ? ?C/O anxiety, as weaning off the Klonopin, by suppertime, he's really edgy, more irritable. Klonopin decrease has only been going on for a little while, feels panicky a lot of the time. The Clonidine hasn't done a thing to help w/ the withdrawals. He describes a feeling of wanting to jump out of his skin in the late afternoon. Has had that sx often, before we started the weaning off Klonopin. ? ?Also does not feel that the Zoloft is helping anything either.  Also not sure about the BuSpar or Lamictal.  Still feels depressed, isolates, no longer volunteering at the church and in fact left that church.  Does not cry easily.  Personal hygiene and ADLs are within normal limits.  Not sleeping well, even with the trazodone, which causes intolerable vivid dreams.  He wants to go off of it.  "My mind is working when it should be resting." Wakes up tired.  He has lost about 4 to 5 pounds in the past few months states he is not hungry.  Energy and motivation are very low.  No suicidal or homicidal thoughts. ? ?Patient denies increased energy with decreased need for sleep, no increased talkativeness, no racing thoughts, no impulsivity or risky behaviors, no increased spending, no increased libido, no grandiosity, no increased irritability or anger, no paranoia, and no hallucinations. ? ?Denies dizziness, syncope, seizures, numbness, tingling, tremor, tics, unsteady gait, slurred speech, confusion. Denies muscle or joint pain, stiffness, or dystonia. ? ?Individual Medical History/ Review of Systems: Changes? :No    ? ?Past medications for mental health diagnoses include: ?Rozerem, Trazodone caused vivid dreams, Xanax, Buspar,  mirtazapine caused excessive somnolence and fatigue with flulike symptoms, Belsomra, propranolol for tremor, Zoloft, Abilify caused tremor, Klonopin, Effexor, Ativan, Wellbutrin, Nardil, gabapentin, Librium ?  ?Pertinent info after reviewing Dr. Evelena Leyden notes, his previous psychiatrist in [facility]. ?Past suicide attempt in September 1996, by taking Tylenol.  He was admitted at that time to a psychiatric hospital in [facility].  Admitted to psych hospital multiple times in the past for "nervous breakdowns."  January 1985, February 1986, October 1991, September 1996, July 2000. ? ?Allergies: Garamycin [gentamicin] and Penicillins ? ?Current Medications:  ?Current Outpatient Medications:  ?  aspirin EC 81 MG tablet, Take 81 mg by mouth daily. Swallow whole., Disp: , Rfl:  ?  BioGaia Probiotic (BIOGAIA/GERBER SOOTHE) LIQD, Take 5 drops by mouth daily at 8 pm., Disp: , Rfl:  ?  busPIRone (BUSPAR) 30 MG tablet, TAKE 1 TABLET BY MOUTH 2 TIMES DAILY., Disp: 180 tablet, Rfl: 1 ?  fluvoxaMINE (LUVOX) 100 MG tablet, One po at bedtime for 1 week, then 2 po qhs., Disp: 60 tablet, Rfl: 1 ?  lamoTRIgine (LAMICTAL) 150 MG tablet, Take 1 tablet (150 mg total) by mouth daily., Disp: 30 tablet, Rfl: 5 ?  mometasone (ELOCON) 0.1 % cream, Apply topically in the morning and at bedtime., Disp: 15 g, Rfl: 1 ?  naproxen (NAPROSYN) 500 MG tablet, TAKE 1 TABLET BY MOUTH 2 TIMES DAILY WITH A MEAL., Disp: 30 tablet, Rfl: 0 ?  psyllium (METAMUCIL) 58.6 % powder, Take 1 packet by mouth in the morning and at bedtime., Disp: , Rfl:  ?  clonazePAM (KLONOPIN) 1 MG tablet, 1 po bid, plus 3/4 pill mid-day., Disp: 75 tablet, Rfl: 1 ?  clotrimazole-betamethasone (LOTRISONE) cream, Apply 1 application topically daily., Disp: 30 g, Rfl: 0 ?Medication Side Effects: sexual dysfunction ? ?Family Medical/ Social History: Changes?  no ? ?MENTAL HEALTH EXAM: ? ?There were no vitals taken for this visit.There is no height  or weight on file to calculate BMI.  ?General Appearance: Casual and Well Groomed  ?Eye Contact:  Good  ?Speech:  Clear and Coherent, Normal Rate, and Talkative  ?Volume:  Normal  ?Mood:  Anxious  ?Affect:  Congruent and Anxious  ?Thought Process:  Goal Directed and Descriptions of Associations: Circumstantial  ?Orientation:  Full (Time, Place, and Person)  ?Thought Content: Obsessions and Rumination   ?Suicidal Thoughts:  No  ?Homicidal Thoughts:  No  ?Memory:  WNL  ?Judgement:  Good  ?Insight:  Good  ?Psychomotor Activity:  Normal  ?Concentration:  Concentration: Good and Attention Span: Fair  ?Recall:  Good  ?Fund of Knowledge: Good  ?Language: Good  ?Assets:  Desire for Improvement  ?ADL's:  Intact  ?Cognition: WNL  ?Prognosis:  Good  ? ? ?See neuro psych test results on chart.. ? ?DIAGNOSES:  ?  ICD-10-CM   ?1. Major depressive disorder, recurrent episode, moderate (HCC)  F33.1   ?  ?2. Social anxiety disorder  F40.10   ?  ?3. Generalized anxiety disorder  F41.1   ?  ?4. Insomnia due to other mental disorder  F51.05   ? F99   ?  ?5. Vivid dream  R68.89   ?  ? ? ? ? ?Receiving Psychotherapy: Yes    Dr. Jonni Sanger Mitchum.  Therapy is going well. ? ? ?RECOMMENDATIONS:  ?PDMP was reviewed.  Klonopin last filled 03/21/2021. ?I provided 40 minutes of face to face time during this encounter, including time spent before and after the visit in records review, medical decision making, counseling pertinent to today's visit, and charting.  ?Recommend doing only 1 major change at a time, and he was told that unfortunately there is no way to know how much the medications are helping until we stopped them.  I recommend changing the Zoloft to another antidepressant as the first step.  I recommend Luvox because it is sedating, is good for OCD which he definitely has tendencies, helps depression and anxiety.  I believe Zoloft is the only SSRI he has ever taken so I think it is worth giving that class one more shot. ?Discussed  GeneSight testing.  Cost of up to $330 if his insurance does not pay.  It gives me good information concerning methyl folate to folate conversion and which medications are likely not to be beneficial.  The problem with this test is that it does not say exactly which drug will work and at what dose.  It does help narrow the field however.  He wants to check with his insurance to see what they will pay.  If he decides to have the test done before our next visit, he can let us know and I will ask one of the CMAs to obtain the specimen. ? ?Discontinue Trazodone.  ?Discontinued Clonidine already.  ?Continue Buspar 30 mg, 1 po bid.  ?Wean off Zoloft 100 mg, 1 p.o. daily for 1 week and then stop.  At the same time we will start Luvox. ?Start Luvox 100 mg, 1 p.o. nightly for 1 week then increase to 2 p.o. nightly. ?Continue Klonopin 1 mg p.o. twice daily and  3/4 pill mid day.  I offered to send in a 0.5 mg pill which would be easier for him to split to equal 0.75 mg, but that is too confusing.  He prefers to keep using the 1 mg pills. ?Continue Lamictal 150 mg, qd. ?Continue counseling with Dr. [name]. ?Return in 4 weeks. ? ?Donnal Moat, PA-C  ?

## 2021-04-14 ENCOUNTER — Ambulatory Visit (INDEPENDENT_AMBULATORY_CARE_PROVIDER_SITE_OTHER): Payer: Medicare Other | Admitting: Psychiatry

## 2021-04-14 DIAGNOSIS — F331 Major depressive disorder, recurrent, moderate: Secondary | ICD-10-CM

## 2021-04-14 DIAGNOSIS — F341 Dysthymic disorder: Secondary | ICD-10-CM | POA: Diagnosis not present

## 2021-04-14 DIAGNOSIS — F428 Other obsessive-compulsive disorder: Secondary | ICD-10-CM

## 2021-04-14 DIAGNOSIS — F411 Generalized anxiety disorder: Secondary | ICD-10-CM | POA: Diagnosis not present

## 2021-04-14 DIAGNOSIS — F5105 Insomnia due to other mental disorder: Secondary | ICD-10-CM

## 2021-04-14 DIAGNOSIS — F99 Mental disorder, not otherwise specified: Secondary | ICD-10-CM

## 2021-04-14 DIAGNOSIS — F4010 Social phobia, unspecified: Secondary | ICD-10-CM | POA: Diagnosis not present

## 2021-04-14 DIAGNOSIS — G3184 Mild cognitive impairment, so stated: Secondary | ICD-10-CM

## 2021-04-14 NOTE — Progress Notes (Signed)
Psychotherapy Progress Note ?[facility] ?[name], PhD LP ? ?Patient ID: Richard Wilson [name]    MRN: [id number] ?Therapy format: Individual psychotherapy ?Date: 04/14/2021      Start: 9:10a     Stop: 10:00a     Time Spent: 50 min ?Location: In-person  ? ?Session narrative (presenting needs, interim history, self-report of stressors and symptoms, applications of prior therapy, status changes, and interventions made in session) ?Saw psychiatry yesterday, addressed trazodone and vivid dreaming.  Started Luvox QHS, took 2 of them immediately instead of the one, thinking it was supposed to be a more powerful sleep aid, was up all night, to his immense frustration.  Furious at 5am this morning, throwing things.  Bitter about 48$ cost yesterday for Luvox and no results.  Supportively confronted erroneous expectations of medication, validated that today will be hard, and irritated, for want of sleep, but feeling intensely about it doesn't change how it works, and he seems to have made mistakes both in understanding yesterday's medication calls and following directions.  Urge not to add chaos to chaos by ad libbing medication strategy.   ? ?Sensitive about probing sleep hygiene, readily states he does everything wrong, including being up on his Chromebook (presumably nightly porn videos, but also importantly, the blue light issue.  Oriented briefly to light issue and the availability of display controls.  Does not feel he is competent with controls, and has no one he can turn to except maybe [employer] to make the adjustment for him.  Offer to do it myself if he will bring the machine next time, a very brief procedure.  Confesses he does take 90 minute naps most afternoons, enough perhaps to bump his natural need for sleep and encourage wakefulness, which he then wants managed by medication. ? ?Still believes the 1/4 mg Klonopin reduction he has been in for over a month is undermining  his wellbeing and causing the bulk of his distress.  Reminded again his anxiety is a confluence of several things, and agonizing amplifies it, just as sleep loss and other medication fluctuations will. ? ?Went to church Sunday at [facility] and was angered by people not introducing themselves.  Felt entirely too cold.  Interested in [place] and [place], which seems more sterile architecturally but impressed enough with a personable video from the priest. ? ?Acknowledges feeling particularly vulnerable, and fragile.  Tearfully admits he would rather his life end, so he can get out of loneliness and reunite with his mother.  Worries aloud that he is disappointing [place].  Reframed that I definitely hope for him that he can better manage and come to enjoy a better connected, more enjoyable life, but I also recognize the deep loneliness he lives in, the frustrated wish for love and family, and feeling essentially like an old maid.  Acknowledged that his mother was a model of self-recrimination even as she encouraged him not to be.  Further encouraged in following through with church visit of choice, laying off recriminations against himself, and letting the possibilities open up before judging, because jumping to negative conclusions and foreclosing on appearances will certainly get him the results he expects, and he would eventually reify the idea of failure if he doesn't try it at all. ? ?Therapeutic modalities: Cognitive Behavioral Therapy and Solution-Oriented/Positive Psychology ? ?Mental Status/Observations: ? ?Appearance:   Casual     ?Behavior:  Appropriate, a little dramatic  ?Motor:  Normal  ?Speech/Language:  Clear and Coherent  ?Affect:  Appropriate  ?Mood:  irritable and tired, sad  ?Thought process:  normal  ?Thought content:    WNL  ?Sensory/Perceptual disturbances:    WNL  ?Orientation:  Fully oriented  ?Attention:  Good  ?  ?Concentration:  Good  ?Memory:  Attenuated   ?Insight:    Fair  ?Judgment:   Fair  ?Impulse Control:  Fair  ? ?Risk Assessment: ?Danger to Self: No Self-injurious Behavior: No ?Danger to Others: No Physical Aggression / Violence: No ?Duty to Warn: No Access to Firearms a concern: No ? ?Assessment of progress:  stabilized ? ?Diagnosis: ?  ICD-10-CM   ?1. Major depressive disorder, recurrent episode, moderate (HCC)  F33.1   ?  ?2. 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  ?3. Generalized anxiety disorder  F41.1   ?  ?4. Social anxiety disorder  F40.10   ?  ?5. Insomnia due to other mental disorder  F51.05   ? F99   ?  ?6. Mild cognitive impairment of uncertain or unknown etiology -- suspect combination of anxiety, sedative effects, and dyssomnia  G31.84   ?  ?7. Compulsive shopping  F42.8   ?  ? ?Plan:  ?Rectify dosing Luvox and the instructions provided yesterday.  Presumably OK to stack 1 Luvox and usual trazodone. ?Ask questions of psychiatry rather than ad lib medication tweaks, and definitely do not use feeling messed up already as license to experiment randomly ?Follow through on church visit, full freedom to like, dislike, accept, reject, or need to see more ?Set computer display filter or bring back here next visit to have it done ?Other recommendations/advice as may be noted above ?Continue to utilize previously learned skills ad lib ?Maintain medication as prescribed and work faithfully with relevant prescriber(s) if any changes are desired or seem indicated ?Call the clinic on-call service, 988/hotline, 911, or present to [facility] or ER if any life-threatening psychiatric crisis ?Return for session(s) already scheduled. ?Already scheduled visit in this office 04/21/2021. ? ?Blanchie Serve, PhD ?[name], PhD LP ?Clinical Psychologist, [facility] ?[facility] ?[address], [address] ?[place], [place] [address] ?(o) [phone] ?

## 2021-04-20 ENCOUNTER — Encounter: Payer: Self-pay | Admitting: Family Medicine

## 2021-04-20 ENCOUNTER — Ambulatory Visit (INDEPENDENT_AMBULATORY_CARE_PROVIDER_SITE_OTHER): Payer: Medicare Other | Admitting: Family Medicine

## 2021-04-20 ENCOUNTER — Other Ambulatory Visit: Payer: Self-pay

## 2021-04-20 VITALS — BP 122/80 | HR 87 | Temp 97.7°F | Ht 69.0 in | Wt 162.8 lb

## 2021-04-20 DIAGNOSIS — R630 Anorexia: Secondary | ICD-10-CM | POA: Diagnosis not present

## 2021-04-20 DIAGNOSIS — F411 Generalized anxiety disorder: Secondary | ICD-10-CM | POA: Diagnosis not present

## 2021-04-20 DIAGNOSIS — F331 Major depressive disorder, recurrent, moderate: Secondary | ICD-10-CM

## 2021-04-20 NOTE — Progress Notes (Signed)
?[facility] ?[facility] ?[place] ?[place] [address] ?Dept: [phone] ?Dept Fax: [phone] ? ?Office Visit ? ?Subjective:  ? ? Patient ID: Richard Wilson, male    DOB: 05/28/1950, 71 y.o..   MRN: [id number] ? ?Chief Complaint  ?Patient presents with  ? Acute Visit  ?  C/o having weight loss/no appetite.   ? ? ?History of Present Illness: ? ?Patient is in today for with ongoign concerns about a lack of appetitie and weight loss. At his last visit several weeks ago, Mr. Caporale noted some similar complaints. He had started into a slow taper of Klonopin with his psychiatric PA, Ms. Adelene Idler. He notes that even dropping this 0.25 mg/day (down from 3 mg to 2.75 mg total daily dose) has not gone well for him He feels convinced that he needs to be back up on his dose. He does have an upcoming visit with Ms. Adelene Idler. Related to his appetite, he notes that he fixes food, but then does not feel like eating it. During the discussion he also brings u0p an issue about generalized left hand pain. ? ?Past Medical History: ?Patient Active Problem List  ? Diagnosis Date Noted  ? Mild cognitive impairment of uncertain or unknown etiology 01/21/2021  ? Intention tremor   ? Chronic idiopathic constipation 10/23/2020  ? Orthostatic lightheadedness 10/23/2020  ? Erectile dysfunction 10/23/2020  ? Hyperglycemia 09/24/2020  ? 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  ? 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 Thrombocytopenia 04/08/2020  ? Generalized anxiety disorder 01/01/2020  ? Major depressive disorder 01/01/2020  ? Primary osteoarthritis of both hips 07/30/2019  ? Chronic kidney disease, stage 3a 03/19/2019  ? Hyperlipidemia 12/15/2018  ? Arthritis of knee 08/31/2017  ? Nephrolithiasis 06/22/2016  ? Obstructive sleep apnea 04/13/2015  ? Diverticulosis of large intestine without hemorrhage 09/30/2013  ? Mild mitral regurgitation 08/15/2013  ? Diastolic dysfunction 69/62/9528  ? Elevated PSA 12/22/2012   ? RBBB (right bundle branch block) 12/03/2012  ? ?Past Surgical History:  ?Procedure Laterality Date  ? APPENDECTOMY    ? FOOT TENDON SURGERY Right   ? TONSILLECTOMY    ? ?Family History  ?Problem Relation Age of Onset  ? Cancer Mother   ?     Ovarian  ? Heart disease Mother   ? Memory loss Mother   ?     with advanced age  ? Prostate cancer Father   ? Throat cancer Maternal Grandfather   ? Heart attack Paternal Grandfather   ? Cancer Maternal Aunt   ?     Breast, metastatic  ? ?Outpatient Medications Prior to Visit  ?Medication Sig Dispense Refill  ? aspirin EC 81 MG tablet Take 81 mg by mouth daily. Swallow whole.    ? BioGaia Probiotic (BIOGAIA/GERBER SOOTHE) LIQD Take 5 drops by mouth daily at 8 pm.    ? busPIRone (BUSPAR) 30 MG tablet TAKE 1 TABLET BY MOUTH 2 TIMES DAILY. 180 tablet 1  ? clonazePAM (KLONOPIN) 1 MG tablet 1 po bid, plus 3/4 pill mid-day. 75 tablet 1  ? clotrimazole-betamethasone (LOTRISONE) cream Apply 1 application topically daily. 30 g 0  ? fluvoxaMINE (LUVOX) 100 MG tablet One po at bedtime for 1 week, then 2 po qhs. 60 tablet 1  ? lamoTRIgine (LAMICTAL) 150 MG tablet Take 1 tablet (150 mg total) by mouth daily. 30 tablet 5  ? mometasone (ELOCON) 0.1 % cream Apply topically in the morning and at bedtime. 15 g 1  ? naproxen (NAPROSYN) 500 MG  tablet TAKE 1 TABLET BY MOUTH 2 TIMES DAILY WITH A MEAL. 30 tablet 0  ? psyllium (METAMUCIL) 58.6 % powder Take 1 packet by mouth in the morning and at bedtime.    ? ?No facility-administered medications prior to visit.  ? ?Allergies  ?Allergen Reactions  ? Garamycin [Gentamicin] Other (See Comments)  ?  Eyes itched and were edematous  ? Penicillins   ?  Pt reports is childhood allergy and does not remember the reaction  ? ?Objective:  ? ?Today's Vitals  ? 04/20/21 1349  ?BP: 122/80  ?Pulse: 87  ?Temp: 97.7 ?F (36.5 ?C)  ?TempSrc: Temporal  ?SpO2: 98%  ?Weight: 162 lb 12.8 oz (73.8 kg)  ?Height: '5\' 9"'$  (1.753 m)  ? ?Body mass index is 24.04 kg/m?.   ? ?General: Well developed, well nourished. No acute distress. ?Extremities: Left hand with full ROM. No joint swelling, increased warmth, deformity or  ? tenderness. ?Psych: Alert and oriented. Affect is a bit labile today, between a gloomy appearance and then chuckling/laughing. His body posture is like he is melting into his chair. ? ?Health Maintenance Due  ?Topic Date Due  ? Hepatitis C Screening  Never done  ? Zoster Vaccines- Shingrix (1 of 2) Never done  ?   ?Assessment & Plan:  ? ?1. Loss of appetite ?I will check some screening labs related to Mr. Harrel loss of appetite. His weight was down at his last visit, but now has stabilized. We did discuss that lack of energy and loss of appetite could reflect a flare of depression. ? ?- Comprehensive metabolic panel ?- CBC ?- TSH ?- Vitamin B12 ?- Folate ? ?2. Generalized anxiety disorder ?3. Moderate episode of recurrent major depressive disorder ([place]) ?Mr. Bautch continues to work with his [employer]. I will send this note along to Ms. Hurst (at Mr. [facility] request). I am concerned that his behavioral health issues are flaring and not well managed at present. ? ?Return for As scheduled.  ? ?Haydee Salter, MD ?

## 2021-04-21 ENCOUNTER — Ambulatory Visit (INDEPENDENT_AMBULATORY_CARE_PROVIDER_SITE_OTHER): Payer: Medicare Other | Admitting: Psychiatry

## 2021-04-21 DIAGNOSIS — F331 Major depressive disorder, recurrent, moderate: Secondary | ICD-10-CM

## 2021-04-21 DIAGNOSIS — F411 Generalized anxiety disorder: Secondary | ICD-10-CM | POA: Diagnosis not present

## 2021-04-21 DIAGNOSIS — F5105 Insomnia due to other mental disorder: Secondary | ICD-10-CM

## 2021-04-21 DIAGNOSIS — F341 Dysthymic disorder: Secondary | ICD-10-CM | POA: Diagnosis not present

## 2021-04-21 DIAGNOSIS — F4010 Social phobia, unspecified: Secondary | ICD-10-CM

## 2021-04-21 DIAGNOSIS — F99 Mental disorder, not otherwise specified: Secondary | ICD-10-CM

## 2021-04-21 DIAGNOSIS — Z79899 Other long term (current) drug therapy: Secondary | ICD-10-CM

## 2021-04-21 LAB — COMPREHENSIVE METABOLIC PANEL
ALT: 17 U/L (ref 0–53)
AST: 19 U/L (ref 0–37)
Albumin: 4.4 g/dL (ref 3.5–5.2)
Alkaline Phosphatase: 123 U/L — ABNORMAL HIGH (ref 39–117)
BUN: 16 mg/dL (ref 6–23)
CO2: 24 mEq/L (ref 19–32)
Calcium: 9.3 mg/dL (ref 8.4–10.5)
Chloride: 107 mEq/L (ref 96–112)
Creatinine, Ser: 1.1 mg/dL (ref 0.40–1.50)
GFR: 68.8 mL/min (ref >60.00)
Glucose, Bld: 81 mg/dL (ref 70–99)
Potassium: 4.1 mEq/L (ref 3.5–5.1)
Sodium: 140 mEq/L (ref 135–145)
Total Bilirubin: 0.6 mg/dL (ref 0.2–1.2)
Total Protein: 6.6 g/dL (ref 6.0–8.3)

## 2021-04-21 LAB — CBC
HCT: 51.6 % (ref 39.0–52.0)
Hemoglobin: 17.4 g/dL — ABNORMAL HIGH (ref 13.0–17.0)
MCHC: 33.7 g/dL (ref 30.0–36.0)
MCV: 89.3 fl (ref 78.0–100.0)
Platelets: 169 10*3/uL (ref 150.0–400.0)
RBC: 5.78 Mil/uL (ref 4.22–5.81)
RDW: 15.5 % (ref 11.5–15.5)
WBC: 6.6 10*3/uL (ref 4.0–10.5)

## 2021-04-21 LAB — TSH: TSH: 1.02 u[IU]/mL (ref 0.35–5.50)

## 2021-04-21 LAB — VITAMIN B12: Vitamin B-12: 308 pg/mL (ref 211–911)

## 2021-04-21 LAB — FOLATE: Folate: 4.2 ng/mL — ABNORMAL LOW (ref >5.9)

## 2021-04-21 NOTE — Progress Notes (Signed)
Psychotherapy Progress Note ?[facility] ?Richard Moore, PhD LP ? ?Patient ID: [facility] "[facility]")    MRN: [id number] ?Therapy format: Individual psychotherapy ?Date: 04/21/2021      Start: 8:15a     Stop: 9:05a     Time Spent: 50 min ?Location: In-person  ? ?Session narrative (presenting needs, interim history, self-report of stressors and symptoms, applications of prior therapy, status changes, and interventions made in session) ?Brought Chromebook for help setting night light display.  Hopefully it will help him feel more sleep-ready after evening use. ? ?Says he is intent on going back to '3mg'$  Klonopin a day, with or without psychiatrist's blessing, but has scheduled tomorrow to meet about it.  Feels the emotional disruption of dropping just the 1/4 pill is too sustained and intolerable, recites how he's been on it for so many years already, he's approaching 72 so it may not matter, and repeat how he's not going to start taking 4, 5, 6, or 7 a day over it.  Firmly reassured that none of those are worries of ours, it just doesn't seem plausible that that small a difference in chemistry would make such a large and enduring problem in emotional experience on its own, but perhaps he has just tried to make too many changes together, between church affiliation, assessing memory and cognition, and trying to wean sedative and try other meds simultaneously.  Reminded that the impetus for weaning Klonopin was actually his, in response to neuropsych assessment assigning partial responsibility to sedative for reported and observed cognitive effects; if that is not a strong motivator at this point, it doesn't have to be.  Perhaps improved sleep and other adjustments will make enough difference to suit, and then weaning can proceed, at his consent, from a position of relative strength. ? ?Moves on to how he feels guilty about exiting [facility], feels he's made a terrible mistake.  There 16  months, did feel like he'd created something of a home, and says he didn't start feeling urgent to leave until the Klonopin experiment.  Corrected timeline, that he was expressing a sense of pressure before he took that on, but again, the confluence of trying to deal with multiple changes itself probably has been driving high anxiety.  Contextualized and offered that he certainly may reapproach [facility] to let them know he's reconsidered and realizes he overreacted to the sense of pressure he was feeling from the prospect of being tagged as the only one keeping up altar prep.  Initially, still caught up in all/none and lose/lose thinking, i.e., too shaming to go, too shaming to come back, but eventually persuaded that he has the legitimate option to come back to church without having to reenlist for the responsibilities he became "allergic" to.  Firmly assured of grace for that, if he so chooses, and encouraged a two-right-answers approach to the problem of affiliating.  The only wrong answer would be to keep fearing both too much to do either one -- rejoin or relocate, just don't hole up avoiding both.   ? ?Therapeutic modalities: Cognitive Behavioral Therapy, Solution-Oriented/Positive Psychology, Ego-Supportive, and Faith-sensitive ? ?Mental Status/Observations: ? ?Appearance:   Casual and Neat     ?Behavior:  Appropriate  ?Motor:  Normal  ?Speech/Language:   Clear and Coherent  ?Affect:  Appropriate and nondramatic  ?Mood:  dysthymic, sheepish  ?Thought process:  normal, emotionally driven repetitiveness  ?Thought content:    WNL and guilty worry  ?Sensory/Perceptual disturbances:  WNL  ?Orientation:  Fully oriented  ?Attention:  Good  ?  ?Concentration:  Good  ?Memory:  grossly intact  ?Insight:    Good  ?Judgment:   Good  ?Impulse Control:  Fair  ? ?Risk Assessment: ?Danger to Self: No Self-injurious Behavior: No ?Danger to Others: No Physical Aggression / Violence: No ?Duty to Warn: No Access  to Firearms a concern: No ? ?Assessment of progress:  stabilized ? ?Diagnosis: ?  ICD-10-CM   ?1. Major depressive disorder, recurrent episode, moderate (HCC)  F33.1   ?  ?2. 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  ?3. Generalized anxiety disorder  F41.1   ?  ?4. Social anxiety disorder  F40.10   ?  ?5. Insomnia due to other mental disorder  F51.05   ? F99   ?  ?6. Long-term current use of benzodiazepine  Z79.899   ?  ? ?Plan:  ?OK to rescind Klonopin weaning, though I doubt it makes any chemical difference at this point, and the furor about it has been more of a histrionic reaction to combined psychosocial strain, overwrought thinking, and psychological dependency on the medication.  Endorse reinstituting the former Klonopin schedule as a gesture of willingness to help stage changes and better manage overall stress, reduce anxiety, an try again from a more settled position later. ?Not whether Chromebook display filter works appropriately -- op sys was hard to decipher -- and hopefully yellowed display will help with sleep readiness and quality ?Recommend other sleep hygiene measures -- consistent timing, predictable ritual, adequate comfort, food curfew, eliminate emotionally stressful activities before bed, and conscious consent to end this day and start the next when it comes. ?Recommend frank and vulnerable talk with [facility], probably individually, to acknowledge having overreacted, wanting to come back to [facility] without making any strong commitments to service yet, and notice well if they treat him gracefully.  Enact the middle path, to prove all/none and lose/lose aren't necessary. ?Other recommendations/advice as may be noted above ?Continue to utilize previously learned skills ad lib ?Maintain medication as prescribed and work faithfully with relevant prescriber(s) if any changes are desired or seem indicated ?Call the clinic on-call service, 988/hotline, 911, or present to [facility] or ER if any  life-threatening psychiatric crisis ?Return for session(s) already scheduled. ?Already scheduled visit in this office 04/22/2021. ? ?Blanchie Serve, PhD ?Richard Moore, PhD LP ?Clinical Psychologist, [facility] ?[facility] ?[address] ?[place], [place] [address] ?(o) [phone] ?

## 2021-04-22 ENCOUNTER — Telehealth: Payer: Self-pay | Admitting: Physician Assistant

## 2021-04-22 ENCOUNTER — Ambulatory Visit (INDEPENDENT_AMBULATORY_CARE_PROVIDER_SITE_OTHER): Payer: Medicare Other | Admitting: Physician Assistant

## 2021-04-22 ENCOUNTER — Encounter: Payer: Self-pay | Admitting: Physician Assistant

## 2021-04-22 ENCOUNTER — Other Ambulatory Visit: Payer: Self-pay

## 2021-04-22 DIAGNOSIS — F331 Major depressive disorder, recurrent, moderate: Secondary | ICD-10-CM | POA: Diagnosis not present

## 2021-04-22 DIAGNOSIS — F5105 Insomnia due to other mental disorder: Secondary | ICD-10-CM

## 2021-04-22 DIAGNOSIS — F4010 Social phobia, unspecified: Secondary | ICD-10-CM

## 2021-04-22 DIAGNOSIS — Z79899 Other long term (current) drug therapy: Secondary | ICD-10-CM

## 2021-04-22 DIAGNOSIS — F411 Generalized anxiety disorder: Secondary | ICD-10-CM | POA: Diagnosis not present

## 2021-04-22 DIAGNOSIS — F99 Mental disorder, not otherwise specified: Secondary | ICD-10-CM

## 2021-04-22 MED ORDER — CLONAZEPAM 1 MG PO TABS: 1.0000 mg | ORAL_TABLET | Freq: Three times a day (TID) | ORAL | 1 refills | Status: DC | PRN

## 2021-04-22 NOTE — Patient Instructions (Signed)
On the Zoloft (sertraline) 100 mg, take 1 pill every morning for 1 week and then stop. ? ?Call your insurance company to see if (and how much) they will pay for her Gene site test. ? ?Take the Luvox (fluvoxamine) 100 mg, 2 pills every night approximately 30 minutes to 1 hour before bedtime. ? ?Take the Klonopin (clonazepam) 1 mg 3 times a day, with that third pill of the day with the Luvox at approximately 30 minutes to 1 hour before bedtime. ?

## 2021-04-22 NOTE — Telephone Encounter (Signed)
Called patient and told him that we do the test in the office and then an order is put in. [facility] will contact patient before running test to let them know of any costs. Patient said with Medicare he was told there was no OOP costs. I told patient he could come any time to have swab done, as long as there was someone available to do the swab.  ?

## 2021-04-22 NOTE — Telephone Encounter (Signed)
[facility] said he contacted [employer] and they advised him, he would have 0 out of pocket cost for the test, but they need Helene Kelp to initiate the request for him to get the test. ? ? ?Next appt 4/10 ?

## 2021-04-22 NOTE — Progress Notes (Signed)
[facility] ? ?Patient ID: [facility],  ?MRN: [id number] ? ?PCP: Haydee Salter, MD ? ?Date of Evaluation: 04/22/2021 ? time spent:30 minutes ? ?Chief Complaint:  ?Chief Complaint   ?Anxiety; Depression; Insomnia ?  ? ? ?HISTORY/CURRENT STATUS: ?HPI For routine med check. ? ?At the last visit on 04/13/2021 Zoloft was to be weaned and Luvox started at the same time to take its place.  Those instructions were on his AVS, but he thought the Luvox was to take the place of trazodone.  He continued taking Zoloft 200 mg and started Luvox 200 mg.  He did stop the trazodone as directed.   ? ?At the same visit, we agreed to drop the Klonopin by 0.25 mg daily.  Complains of much more anxiety so he already went back up to 1 mg 3 times daily.  States he is less anxious than he had been on the lower dose.  Not panicky, just a sense of being overwhelmed all the time. ? ?Still feels sad but is under a lot of pressure due to circumstances at church.  He has discussed this with Dr. [name].  He does continue to feel hopeless at times.  Not sleeping well but since he has been off the trazodone he is not having vivid, disturbing dreams.  Still does not feel rested when he gets up though.  Still isolates except for church.  ADLs and personal hygiene are within normal limits.  No suicidal or homicidal thoughts. ? ?Patient denies increased energy with decreased need for sleep, no increased talkativeness, no racing thoughts, no impulsivity or risky behaviors, no increased spending, no increased libido, no grandiosity, no increased irritability or anger, no paranoia, and no hallucinations. ? ?Denies dizziness, syncope, seizures, numbness, tingling, tremor, tics, unsteady gait, slurred speech, confusion. Denies muscle or joint pain, stiffness, or dystonia. Denies fever, sweating, tremor, or diarrhea. ? ?Individual Medical History/ Review of Systems: Changes? :No    ? ?Past medications for mental health diagnoses  include: ?Rozerem, Trazodone caused vivid dreams, Xanax, Buspar, mirtazapine caused excessive somnolence and fatigue with flulike symptoms, Belsomra, propranolol for tremor, Zoloft, Abilify caused tremor, Klonopin, Effexor, Ativan, Wellbutrin, Nardil, gabapentin, Librium ?  ?Pertinent info after reviewing Dr. Evelena Leyden notes, his previous psychiatrist in [facility]. ?Past suicide attempt in September 1996, by taking Tylenol.  He was admitted at that time to a psychiatric hospital in [facility].  Admitted to psych hospital multiple times in the past for "nervous breakdowns."  January 1985, February 1986, October 1991, September 1996, July 2000. ? ?Allergies: Garamycin [gentamicin] and Penicillins ? ?Current Medications:  ?Current Outpatient Medications:  ?  aspirin EC 81 MG tablet, Take 81 mg by mouth daily. Swallow whole., Disp: , Rfl:  ?  BioGaia Probiotic (BIOGAIA/GERBER SOOTHE) LIQD, Take 5 drops by mouth daily at 8 pm., Disp: , Rfl:  ?  busPIRone (BUSPAR) 30 MG tablet, TAKE 1 TABLET BY MOUTH 2 TIMES DAILY., Disp: 180 tablet, Rfl: 1 ?  clotrimazole-betamethasone (LOTRISONE) cream, Apply 1 application topically daily., Disp: 30 g, Rfl: 0 ?  fluvoxaMINE (LUVOX) 100 MG tablet, One po at bedtime for 1 week, then 2 po qhs., Disp: 60 tablet, Rfl: 1 ?  lamoTRIgine (LAMICTAL) 150 MG tablet, Take 1 tablet (150 mg total) by mouth daily., Disp: 30 tablet, Rfl: 5 ?  mometasone (ELOCON) 0.1 % cream, Apply topically in the morning and at bedtime., Disp: 15 g, Rfl: 1 ?  naproxen (NAPROSYN) 500 MG tablet, TAKE 1  TABLET BY MOUTH 2 TIMES DAILY WITH A MEAL., Disp: 30 tablet, Rfl: 0 ?  psyllium (METAMUCIL) 58.6 % powder, Take 1 packet by mouth in the morning and at bedtime., Disp: , Rfl:  ?  clonazePAM (KLONOPIN) 1 MG tablet, Take 1 tablet (1 mg total) by mouth 3 (three) times daily as needed for anxiety. Hold until he requests, he will need filled earlier than expected b/c we increased dose to  tid today, 04/22/2021., Disp: 90 tablet, Rfl: 1 ?Medication Side Effects: sexual dysfunction ? ?Family Medical/ Social History: Changes?  no ? ?MENTAL HEALTH EXAM: ? ?There were no vitals taken for this visit.There is no height or weight on file to calculate BMI.  ?General Appearance: Casual and Well Groomed  ?Eye Contact:  Good  ?Speech:  Clear and Coherent, Normal Rate, and Talkative  ?Volume:  Normal  ?Mood:  Anxious  ?Affect:  Anxious  ?Thought Process:  Goal Directed and Descriptions of Associations: Circumstantial  ?Orientation:  Full (Time, Place, and Person)  ?Thought Content: Obsessions and Rumination   ?Suicidal Thoughts:  No  ?Homicidal Thoughts:  No  ?Memory:  WNL  ?Judgement:  Good  ?Insight:  Good  ?Psychomotor Activity:  Normal  ?Concentration:  Concentration: Good and Attention Span: Fair  ?Recall:  Good  ?Fund of Knowledge: Good  ?Language: Good  ?Assets:  Desire for Improvement  ?ADL's:  Intact  ?Cognition: WNL  ?Prognosis:  Good  ? ? ?See neuro psych test results on chart.. ? ?DIAGNOSES:  ?  ICD-10-CM   ?1. Major depressive disorder, recurrent episode, moderate (HCC)  F33.1   ?  ?2. Generalized anxiety disorder  F41.1   ?  ?3. Social anxiety disorder  F40.10   ?  ?4. Insomnia due to other mental disorder  F51.05   ? F99   ?  ?5. Long-term current use of benzodiazepine  Z79.899   ?  ? ? ?Receiving Psychotherapy: Yes    Dr. Jonni Sanger Mitchum.  Therapy is going well. ? ? ?RECOMMENDATIONS:  ?PDMP was reviewed.  Klonopin last filled 03/21/2021. ?I provided 30  minutes of face to face time during this encounter, including time spent before and after the visit in records review, medical decision making, counseling pertinent to today's visit, and charting.  ?I reminded him to look at the AVS, as I did put the instructions of how to wean off Zoloft and introduce Luvox.  At this point I am not concerned about serotonin syndrome but will wean off the Zoloft over this week.  These instructions are on today's AVS and  I will also handwrote them on an old prescription pad, gave them to the patient and went over that with him. ?He understands that it may take another month before he feels improvement with the depression since being on the Luvox.  The goal is to help depression, generalized anxiety, panic attacks and OCD. ?Recommend that he take the Klonopin, third pill of the day, about 30 to 60 minutes before bedtime.  Hopefully that will help him go to sleep and stay asleep. ? ?Continue Buspar 30 mg, 1 po bid.  ?Wean off Zoloft 100 mg, 1 p.o. daily for 1 week and then stop.   ?Continue Luvox 100 mg, 2 p.o. nightly. ?Continue Klonopin 1 mg p.o. 3 times daily.  At this point, I am not sure we will ever be able to wean him off, but will try again in the future if warranted.  Now it is definitely not the time.  He understands the increased risk of confusion and falls in the elderly.   ?Continue Lamictal 150 mg, qd. ?Continue counseling with Dr. [name]. ?Return in 4 weeks. ? ?Donnal Moat, PA-C  ?

## 2021-04-28 ENCOUNTER — Ambulatory Visit (INDEPENDENT_AMBULATORY_CARE_PROVIDER_SITE_OTHER): Payer: Medicare Other | Admitting: Psychiatry

## 2021-04-28 ENCOUNTER — Other Ambulatory Visit: Payer: Self-pay

## 2021-04-28 DIAGNOSIS — F331 Major depressive disorder, recurrent, moderate: Secondary | ICD-10-CM | POA: Diagnosis not present

## 2021-04-28 DIAGNOSIS — F4010 Social phobia, unspecified: Secondary | ICD-10-CM | POA: Diagnosis not present

## 2021-04-28 DIAGNOSIS — G3184 Mild cognitive impairment, so stated: Secondary | ICD-10-CM

## 2021-04-28 DIAGNOSIS — F411 Generalized anxiety disorder: Secondary | ICD-10-CM

## 2021-04-28 DIAGNOSIS — F341 Dysthymic disorder: Secondary | ICD-10-CM | POA: Diagnosis not present

## 2021-04-28 DIAGNOSIS — Z79899 Other long term (current) drug therapy: Secondary | ICD-10-CM

## 2021-04-28 NOTE — Progress Notes (Signed)
Psychotherapy Progress Note ?[facility] ?[name], PhD LP ? ?Patient ID: [facility] [name])    MRN: [id number] ?Therapy format: Individual psychotherapy ?Date: 04/28/2021      Start: 9:14a     Stop: 10:04a     Time Spent: 50 min ?Location: In-person  ? ?Session narrative (presenting needs, interim history, self-report of stressors and symptoms, applications of prior therapy, status changes, and interventions made in session) ?Have been in active consultation with psychiatry, and Pt saw Ms. Hurst last week.  Noted that she identified medication mistake taking Zoloft and Luvox simultaneously, though not enough to cause serotonin syndrome.  Also noted resumed formerly normal schedule of Klonopin '3mg'$ /D, with instruction to continue Buspar and Lamictal.   ? ?Says today he did not remember he was supposed to come off Zoloft and has still been taking it, '200mg'$  QAM.  Refreshed instructions, hand wrote and sent with him for reminder's sake.   Typically sets up a 1-day pillbox each night, so will start 1 Zoloft rather than 2 with tonight's loading.  Not sleeping well.  Admittedly up late, on his tablet, presumably watching porn.  Amber display setting is working as intended since last week's intervention, and it is visually comfortable.  Still up late in evening.  Typically getting a 90 min nap in the afternoon, which may be enough to throw circadian rhythm, but likely enough he is just ruminating and it is such a habit by now.  Encouraged trimming his afternoon nap and decidedly allowing himself to go to sleep rather than staying up, but 1 step at a time.  Klonopin restoration has eliminated 5pm edginess, and this morning, he is definitely off his edge since restoring psychologically comfortable Klonopin schedule.   ? ?Feels down this morning, states maybe still guilty for exiting [facility] as he did.  Clearly not the angst of last week, but guilty nonetheless.  At the same time, he  has concluded now that it would still be better to attend a church in [place], rather than commute to [place].  Visited an [employer] church Sunday in [place], but it is too far, and it felt a little less than welcoming.  Likely at this point to attend [facility], which seems to be both high church enough and assuredly welcoming to a gay member and certainly a shorter commute.  Notes last week he emailed very best to offer his assistance at Monday Thursday service next week then retracted, again saying he could not do it.  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  Assured again it is okay, it is okay to change churches and to decline unspoken expectations, that it really is important to have honest boundaries, whether it is just church work or the prosthetic family he wants church to be.  Says he misses his mother more acutely this season that he has for a while, tying it to [place] coming, which always meant much to her.  Validated that it would be more meaningful right now as he is "between [employer] and his church affiliation.  Quandary whether to attend church this  Sunday, currently feels disinclined, which may serve to entrenched his loneliness and depression, but he owns that he may change his mind by Sunday.  Encouraged both ways, that what ever he chooses be truly taking care of [name] the way his mother would want for him. ? ?Probed for friends and social support he can turn to, but really does not have any developed relationships at present.  Brief discussion of a broach that he is wearing, which he has worn before.  Notes that it is English sterling silver, a very undervalued find from  his pawn shopping.  He has pieces of his mother's, but he does not wear them, presumably out of respect.  Does note an inexpensive but sentimentally valuable necklace he thought his Sister Juliann Pulse may have, but it may be with his niece.  Briefly discussed freedom to inquire after it, breaking down inflated fears that Juliann Pulse would be angered by him asking.  Made clear that if he wants to ask his niece, she is 81 years old and plenty capable of taking his call and deciding for herself whether to look among her things to help out her uncle. ? ?Therapeutic modalities: Cognitive Behavioral Therapy, Solution-Oriented/Positive Psychology, Ego-Supportive, and Faith-sensitive ? ?Mental Status/Observations: ? ?Appearance:   Casual and Neat     ?Behavior:  Appropriate  ?Motor:  Normal  ?Speech/Language:   Clear and Coherent and quieter  ?Affect:  subdued  ?Mood:  sad  ?Thought process:  normal  ?Thought content:    Obsessions  ?Sensory/Perceptual disturbances:    WNL  ?Orientation:  Fully oriented  ?Attention:  Good  ?  ?Concentration:  Good  ?Memory:  Limited working memory  ?Insight:    Good  ?Judgment:   Good  ?Impulse Control:  Good  ? ?Risk Assessment: ?Danger to Self: No Self-injurious Behavior: No ?Danger to Others: No Physical Aggression / Violence: No ?Duty to Warn: No Access to Firearms a concern: No ? ?Assessment of progress:  stabilized ? ?Diagnosis: ?  ICD-10-CM   ?1. Major depressive disorder, recurrent episode, moderate (HCC)  F33.1   ?  ?2. 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  ?3. Generalized anxiety disorder  F41.1   ?  ?4. Social anxiety disorder  F40.10   ?  ?5. Mild cognitive impairment of uncertain or unknown etiology -- suspect combination of anxiety, sedative effects, and dyssomnia with possible apnea  G31.84   ?  ?6. Long-term current use of benzodiazepine  Z79.899   ?  ? ?Plan:  ?Use written instructions for Zoloft taper and follow through over the coming week ?Completely at discretion, and strongly  suggested to choose for affirmative reasons, may attend [facility], stay home, or shop another charge of his choosing -- main emphasis to make his choice affirmative rather than avoidant ?Continuing endorsement to communicate with [facility] as he is moved, and to Self-affirm that it is okay to choose to withdraw or change ?Continue using automated display change on electronic device ?Encourage shortening afternoon nap and abbreviating evening video consumption ?Other recommendations/advice as may be noted above ?Continue to utilize previously learned skills ad lib ?Maintain medication as prescribed and work faithfully with relevant prescriber(s) if any changes are desired or seem indicated ?Call the clinic on-call service, 988/hotline, 911, or present to [facility] or ER if any life-threatening psychiatric crisis ?Return for session(s) already scheduled. ?Already scheduled visit in this office 05/04/2021. ? ?Blanchie Serve, PhD ?[name], PhD LP ?Clinical Psychologist, [facility] ?[facility] ?[address], [address] ?[place], [place] [address] ?(o) [phone] ?

## 2021-05-04 ENCOUNTER — Ambulatory Visit: Payer: Medicare Other | Admitting: Psychiatry

## 2021-05-04 ENCOUNTER — Telehealth: Payer: Self-pay | Admitting: Physician Assistant

## 2021-05-04 NOTE — Telephone Encounter (Signed)
[facility] came into the office today at 9:00am needing to be seen.  No appts are available so he was referred to the [facility] urgent care center.  He did not go into details, but said he needed to seen by someone.  Again, he said he would go the [facility] center, but asked for a call from our office.  Please call ?

## 2021-05-04 NOTE — Telephone Encounter (Signed)
Noted  

## 2021-05-04 NOTE — Telephone Encounter (Signed)
Called patient. He is very irritable.  He said the [facility] address could not be found in his GPS. I tried to give him directions and he said they gave him a printout in the office. I told him he could go to [facility] ER also.  He said he is tired of you prescribing medications that do not work. He isn't sleeping and the Luvox he thought was supposed to help has not. He said he got 2 hours of sleep last night. He is having difficulty getting to sleep and staying asleep.  Denies SI.  ?

## 2021-05-05 ENCOUNTER — Other Ambulatory Visit: Payer: Self-pay | Admitting: Physician Assistant

## 2021-05-06 ENCOUNTER — Telehealth: Payer: Self-pay | Admitting: Physician Assistant

## 2021-05-06 ENCOUNTER — Ambulatory Visit (INDEPENDENT_AMBULATORY_CARE_PROVIDER_SITE_OTHER): Payer: Medicare Other | Admitting: Psychiatry

## 2021-05-06 ENCOUNTER — Other Ambulatory Visit: Payer: Self-pay | Admitting: Physician Assistant

## 2021-05-06 DIAGNOSIS — Z79899 Other long term (current) drug therapy: Secondary | ICD-10-CM

## 2021-05-06 DIAGNOSIS — F411 Generalized anxiety disorder: Secondary | ICD-10-CM | POA: Diagnosis not present

## 2021-05-06 DIAGNOSIS — F331 Major depressive disorder, recurrent, moderate: Secondary | ICD-10-CM

## 2021-05-06 DIAGNOSIS — F341 Dysthymic disorder: Secondary | ICD-10-CM | POA: Diagnosis not present

## 2021-05-06 DIAGNOSIS — F4010 Social phobia, unspecified: Secondary | ICD-10-CM | POA: Diagnosis not present

## 2021-05-06 DIAGNOSIS — G3184 Mild cognitive impairment, so stated: Secondary | ICD-10-CM

## 2021-05-06 MED ORDER — AMITRIPTYLINE HCL 25 MG PO TABS: 25.0000 mg | ORAL_TABLET | Freq: Every day | ORAL | 1 refills | Status: DC

## 2021-05-06 MED ORDER — FLUVOXAMINE MALEATE 100 MG PO TABS: 200.0000 mg | ORAL_TABLET | Freq: Every day | ORAL | 1 refills | Status: DC

## 2021-05-06 NOTE — Progress Notes (Signed)
Psychotherapy Progress Note ?[facility] ?Richard Moore, PhD LP ? ?Patient ID: Richard Wilson [facility] [name])    MRN: [id number] ?Therapy format: Individual psychotherapy ?Date: 05/06/2021      Start: 5:04p     Stop: 5:54p     Time Spent: 50 min ?Location: In-person  ? ?Session narrative (presenting needs, interim history, self-report of stressors and symptoms, applications of prior therapy, status changes, and interventions made in session) ?Made outreach today to offer cancellation, accepted by PT.  Note having received a letter from PT written Sunday, delivered in person Monday ahead of cancelled appt, .  Admittedly sleep-deprived, admittedly spending hours each evening with gay porn, used to like to read but can't concentrate.  Sees himself aging a lot faster, particularly hair loss, and eye bags.  Acknowledges lost concentration bothers him a lot, feels weakened.  Confesses he contacted an old psychiatrist in desperation, feels he has been disloyal to [employer] -- assured otherwise.  Brings a shirt that he took up this week, sewing a simple stitch repeatedly until calmer, a form of self-soothing.  Affirmed as a method.  Says also that he had begun to lose faith in God, but found himself renewed, assured by being called today for this appt, then by getting a call from [facility]'s new member outreach chair.  Discussed taking the lunch meeting proposed, the likely process, and encouraged, as he seems to be now set again on changing churches.  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 ? ?Discussed and provided empathic listening for concerns of aging, including preoccupation with hair loss, ED, and other characteristics.  As his need for sleep reinforcement had been  discerned, psychiatry has responded to his request for sleep aid.  Provided written instructions left by his psychiatrist, whom he will see early next week.  Went over instructions for clarity.   ? ?Therapeutic modalities: Cognitive Behavioral Therapy and Solution-Oriented/Positive Psychology ? ?Mental Status/Observations: ? ?Appearance:   Casual     ?Behavior:  Appropriate  ?Motor:  Normal  ?Speech/Language:   Clear and Coherent  ?Affect:  nonlabile  ?Mood:  tired  ?Thought process:  normal  ?Thought content:    WNL  ?Sensory/Perceptual disturbances:    WNL  ?Orientation:  Fully oriented  ?Attention:  Good  ?  ?Concentration:  Fair  ?Memory:  grossly intact  ?Insight:    Good  ?Judgment:   Good  ?Impulse Control:  Good  ? ?Risk Assessment: ?Danger to Self: No Self-injurious Behavior: No ?Danger to Others: No Physical Aggression / Violence: No ?Duty to Warn: No Access to Firearms a concern: No ? ?Assessment of progress:  progressing ? ?Diagnosis: ?  ICD-10-CM   ?1. Major depressive disorder, recurrent episode, moderate (HCC)  F33.1   ?  ?2. 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  ?3. Generalized anxiety disorder  F41.1   ?  ?4. Social anxiety disorder  F40.10   ?  ?5. Mild cognitive impairment of uncertain or unknown etiology -- suspect combination of anxiety, sedative effects, and dyssomnia with possible apnea  G31.84   ?  ?6. Long-term current use of benzodiazepine  Z79.899   ?  ? ?Plan:  ?Endorse trying out new church, just guard against  letting compulsions to responsibility and self-sacrifice keep from making the decision ?Take up efforts to engage socially ?Try to reduce porn viewing and the agonizing over age and lost sexual opportunity that goes with it ?Follow through on new sleep aid recommendation ?Other recommendations/advice as may be noted above ?Continue to utilize previously learned skills ad lib ?Maintain medication as prescribed and work faithfully with relevant prescriber(s) if any changes are desired or  seem indicated ?Call the clinic on-call service, 988/hotline, 911, or present to [facility] or ER if any life-threatening psychiatric crisis ?Return in about 1 week (around 05/13/2021). ?Already scheduled visit in this office 05/11/2021. ? ?Blanchie Serve, PhD ?Richard Moore, PhD LP ?Clinical Psychologist, [facility] ?[facility] ?[address], [address] ?[place], [place] [address] ?(o) [phone] ?

## 2021-05-06 NOTE — Telephone Encounter (Signed)
Called patient. He said he has been taking 2 tablets since 3/20. He is able to get to sleep, he just can't stay asleep. He estimates he is getting 5-6 hours of sleep. Please advise.  ?

## 2021-05-06 NOTE — Telephone Encounter (Signed)
Next visit is 05/11/21. Richard Wilson walked into the office at 1045 am and is requesting something different other than the Fluvoxamine 100 mg he is taking now. He told me that the medicine is not helping him sleep. His phone number is [phone]. Pharmacy is: ? ?[id number] [place], [place]? ?Phone:  [phone] ?Fax:  [phone] ? ? ? ? ? ?

## 2021-05-06 NOTE — Telephone Encounter (Signed)
Detailed note handwritten out and given to patient during his appointment with Dr. [name] today. ?Note states "I have sent and amitriptyline for sleep.  [facility].  We will discuss further at our appointment next week.  I think this will help.  Please continue the BuSpar, Klonopin, Luvox, Lamictal (doses and other names were given)" ?

## 2021-05-11 ENCOUNTER — Ambulatory Visit (INDEPENDENT_AMBULATORY_CARE_PROVIDER_SITE_OTHER): Payer: Medicare Other | Admitting: Physician Assistant

## 2021-05-11 ENCOUNTER — Encounter: Payer: Self-pay | Admitting: Physician Assistant

## 2021-05-11 DIAGNOSIS — F411 Generalized anxiety disorder: Secondary | ICD-10-CM

## 2021-05-11 DIAGNOSIS — F331 Major depressive disorder, recurrent, moderate: Secondary | ICD-10-CM | POA: Diagnosis not present

## 2021-05-11 DIAGNOSIS — F99 Mental disorder, not otherwise specified: Secondary | ICD-10-CM

## 2021-05-11 DIAGNOSIS — Z1589 Genetic susceptibility to other disease: Secondary | ICD-10-CM | POA: Diagnosis not present

## 2021-05-11 DIAGNOSIS — F5105 Insomnia due to other mental disorder: Secondary | ICD-10-CM | POA: Diagnosis not present

## 2021-05-11 MED ORDER — LAMOTRIGINE 100 MG PO TABS: 100.0000 mg | ORAL_TABLET | Freq: Two times a day (BID) | ORAL | 1 refills | Status: DC

## 2021-05-11 MED ORDER — CEREFOLIN NAC 6-90.314-2-600 MG PO TABS: 1.0000 | ORAL_TABLET | Freq: Every day | ORAL | 5 refills | Status: DC

## 2021-05-11 NOTE — Patient Instructions (Signed)
We're changing being the dose of Lamictal from 150 mg daily to a total of 200 mg daily.  Put the current bottle of Lamictal 150 mg in the back of your medicine cabinet and no longer take.  I am sending in a new prescription for Lamictal 100 mg and you will take 1 pill twice a day. ? ?We are starting Cerefolin NAC which is a supplement to help convert methyl folate to folate. ?

## 2021-05-11 NOTE — Progress Notes (Signed)
[facility] ? ?Patient ID: Armando Reichert,  ?MRN: [id number] ? ?PCP: Haydee Salter, MD ? ?Date of Evaluation: 05/11/2021 ? time spent:40 minutes ? ?Chief Complaint:  ?Chief Complaint   ?Anxiety; Depression; Insomnia; Follow-up ?  ? ? ?HISTORY/CURRENT STATUS: ?HPI For routine med check. ? ?Elavil was started last week for sleep.  Very happy to report that he is sleeping some better.  He even got almost 7 hours 1 night, although he woke up a few times but was able to go back to sleep fairly quickly.  Another night it was only about 5 hours.  Still that is better than he had been getting.   ? ?He has been on Luvox for about 6 weeks now, after changing from Zoloft.  States he is still depressed.  Very lonely, does not have any social life outside of church and may be changing churches soon.  He has been in [place] now for about a year, having moved here because he found an apartment that he could afford.  Anhedonia, energy and motivation are low but better since he has had some sleep for a few days, he does cry easily.  ADLs and personal hygiene are normal.  No suicidal or homicidal thoughts. ? ?Anxiety is about the same.  Klonopin continues to help.  If he does not take it he feels a huge weight, like something bad is going to happen at any time.  We have attempted to decrease the dose but he has been on it for years and the decrease did not go well. ? ?Patient denies increased energy with decreased need for sleep, no increased talkativeness, no racing thoughts, no impulsivity or risky behaviors, no increased spending, no grandiosity, no increased irritability or anger, no paranoia, and no hallucinations. ? ?Denies dizziness, syncope, seizures, numbness, tingling, tremor, tics, unsteady gait, slurred speech, confusion. Denies muscle or joint pain, stiffness, or dystonia. Denies fever, sweating, tremor, or diarrhea. ? ?Individual Medical History/ Review of Systems: Changes? :No    ? ?Past medications for  mental health diagnoses include: ?Rozerem, Trazodone caused vivid dreams, Xanax, Buspar, mirtazapine caused excessive somnolence and fatigue with flulike symptoms, Belsomra, propranolol for tremor, Zoloft, Abilify caused tremor, Klonopin, Effexor, Ativan, Wellbutrin, Nardil, gabapentin, Librium ?  ?Pertinent info after reviewing Dr. Evelena Leyden notes, his previous psychiatrist in [facility]. ?Past suicide attempt in September 1996, by taking Tylenol.  He was admitted at that time to a psychiatric hospital in [facility].  Admitted to psych hospital multiple times in the past for "nervous breakdowns."  January 1985, February 1986, October 1991, September 1996, July 2000. ? ?Allergies: Garamycin [gentamicin] and Penicillins ? ?Current Medications:  ?Current Outpatient Medications:  ?  amitriptyline (ELAVIL) 25 MG tablet, Take 1 tablet (25 mg total) by mouth at bedtime., Disp: 30 tablet, Rfl: 1 ?  aspirin EC 81 MG tablet, Take 81 mg by mouth daily. Swallow whole., Disp: , Rfl:  ?  BioGaia Probiotic (BIOGAIA/GERBER SOOTHE) LIQD, Take 5 drops by mouth daily at 8 pm., Disp: , Rfl:  ?  busPIRone (BUSPAR) 30 MG tablet, TAKE 1 TABLET BY MOUTH 2 TIMES DAILY., Disp: 180 tablet, Rfl: 1 ?  clonazePAM (KLONOPIN) 1 MG tablet, Take 1 tablet (1 mg total) by mouth 3 (three) times daily as needed for anxiety. Hold until he requests, he will need filled earlier than expected b/c we increased dose to tid today, 04/22/2021., Disp: 90 tablet, Rfl: 1 ?  clotrimazole-betamethasone (LOTRISONE) cream, Apply 1 application  topically daily., Disp: 30 g, Rfl: 0 ?  fluvoxaMINE (LUVOX) 100 MG tablet, Take 2 tablets (200 mg total) by mouth at bedtime., Disp: 60 tablet, Rfl: 1 ?  lamoTRIgine (LAMICTAL) 100 MG tablet, Take 1 tablet (100 mg total) by mouth 2 (two) times daily., Disp: 60 tablet, Rfl: 1 ?  Methylfol-Algae-B12-Acetylcyst (CEREFOLIN NAC) 6-90.314-2-600 MG TABS, Take 1 tablet by mouth daily., Disp: 30 tablet,  Rfl: 5 ?  mometasone (ELOCON) 0.1 % cream, Apply topically in the morning and at bedtime., Disp: 15 g, Rfl: 1 ?  naproxen (NAPROSYN) 500 MG tablet, TAKE 1 TABLET BY MOUTH 2 TIMES DAILY WITH A MEAL., Disp: 30 tablet, Rfl: 0 ?  psyllium (METAMUCIL) 58.6 % powder, Take 1 packet by mouth in the morning and at bedtime., Disp: , Rfl:  ?Medication Side Effects: sexual dysfunction ? ?Family Medical/ Social History: Changes?  no ? ?MENTAL HEALTH EXAM: ? ?There were no vitals taken for this visit.There is no height or weight on file to calculate BMI.  ?General Appearance: Casual and Well Groomed  ?Eye Contact:  Good  ?Speech:  Clear and Coherent, Normal Rate, and Talkative  ?Volume:  Normal  ?Mood:  Depressed  ?Affect:  Depressed  ?Thought Process:  Goal Directed and Descriptions of Associations: Circumstantial  ?Orientation:  Full (Time, Place, and Person)  ?Thought Content: Obsessions and Rumination   ?Suicidal Thoughts:  No  ?Homicidal Thoughts:  No  ?Memory:  WNL  ?Judgement:  Good  ?Insight:  Good  ?Psychomotor Activity:  Normal  ?Concentration:  Concentration: Good and Attention Span: Fair  ?Recall:  Good  ?Fund of Knowledge: Good  ?Language: Good  ?Assets:  Desire for Improvement  ?ADL's:  Intact  ?Cognition: WNL  ?Prognosis:  Good  ? ?Gene site test results are on chart under media. ?See neuro psych test results on chart.. ? ?DIAGNOSES:  ?  ICD-10-CM   ?1. Major depressive disorder, recurrent episode, moderate (HCC)  F33.1   ?  ?2. Generalized anxiety disorder  F41.1   ?  ?3. Insomnia due to other mental disorder  F51.05   ? F99   ?  ?4. Heterozygous MTHFR mutation C677T  Z15.89   ?  ? ? ?Receiving Psychotherapy: Yes    Dr. Jonni Sanger Mitchum.  ? ? ?RECOMMENDATIONS:  ?PDMP was reviewed.  Klonopin last filled 04/19/2021. ?I provided 40 minutes of face to face time during this encounter, including time spent before and after the visit in records review, medical decision making, counseling pertinent to today's visit, and  charting.  ?We discussed his Gene site test results in detail.  He is currently on Luvox for the past 6 weeks or so, and may be a rapid metabolizer, requiring higher doses than expected.  At this time though since we have just started a low dose of Elavil to help with sleep plus he has reduced folic acid conversion which I think needs to be treated and can help depression, I do not want to change the Luvox.  He verbalizes understanding.  If we do decide to change, and another month, would recommend Pristiq although Fetzima and Viibryd also may be good choices.  (Refer to Gene site test results.) ?We discussed MTHFR abnormality and need for Cerefolin or Deplin.  He would like to see if insurance will cover this before sending it to a specialty pharmacy which is what we normally have to do. ?I recommend increasing Lamictal as it may be beneficial for depression as well. ? ?Continue amitriptyline 25  mg, 1 p.o. nightly. ?Continue BuSpar 30 mg, 1 p.o. twice daily. ?Continue Klonopin 1 mg, 1 p.o. 3 times daily as needed. ?Continue Luvox 100 mg, 2 p.o. nightly. ?Increase Lamictal from 150 mg daily to a 100 mg pill 1 p.o. twice daily.  This was written on the AVS as well as explained to him and note for the receptionist to make sure he takes the AVS. ?Start Cerafolin NAC, 1 p.o. daily. ?Continue counseling with Dr. [name]. ?Return in 4 weeks. ? ?Donnal Moat, PA-C  ?

## 2021-05-12 ENCOUNTER — Encounter: Payer: Self-pay | Admitting: Physician Assistant

## 2021-05-13 ENCOUNTER — Ambulatory Visit (INDEPENDENT_AMBULATORY_CARE_PROVIDER_SITE_OTHER): Payer: Medicare Other | Admitting: Psychiatry

## 2021-05-13 DIAGNOSIS — Z1589 Genetic susceptibility to other disease: Secondary | ICD-10-CM

## 2021-05-13 DIAGNOSIS — F411 Generalized anxiety disorder: Secondary | ICD-10-CM

## 2021-05-13 DIAGNOSIS — F5105 Insomnia due to other mental disorder: Secondary | ICD-10-CM

## 2021-05-13 DIAGNOSIS — Z79899 Other long term (current) drug therapy: Secondary | ICD-10-CM

## 2021-05-13 DIAGNOSIS — R69 Illness, unspecified: Secondary | ICD-10-CM

## 2021-05-13 DIAGNOSIS — F99 Mental disorder, not otherwise specified: Secondary | ICD-10-CM

## 2021-05-13 DIAGNOSIS — F331 Major depressive disorder, recurrent, moderate: Secondary | ICD-10-CM | POA: Diagnosis not present

## 2021-05-13 DIAGNOSIS — F4010 Social phobia, unspecified: Secondary | ICD-10-CM

## 2021-05-13 DIAGNOSIS — F341 Dysthymic disorder: Secondary | ICD-10-CM | POA: Diagnosis not present

## 2021-05-13 NOTE — Progress Notes (Signed)
Psychotherapy Progress Note ?[facility] ?[name], PhD LP ? ?Patient ID: [facility] "Richard Wilson")    MRN: [id number] ?Therapy format: Individual psychotherapy ?Date: 05/13/2021      Start: 3:19p     Stop: 4:08p     Time Spent: 49 min ?Location: In-person  ? ?Session narrative (presenting needs, interim history, self-report of stressors and symptoms, applications of prior therapy, status changes, and interventions made in session) ?Received another letter from PT, written on a Sunday evening, expressing distress.  Clarified expectations, no demand on TX intended.  Encouraged as better to make notes, bring notes to session, rather than necessarily deliver written messages between sessions which might imply demand, even if they are illustrative of the dilemmas and conflicts he goes through on his own time. ? ?Admits orientation to day of the week slips, and he has had some times when he lost time.  Recently called back by a rep of Richard Wilson ([place]) for an inquiry he did not recall making.  Support/empathy provided, and dug into recent test findings.  Found he has MTHFR variation, checked out Cerefolin NAC at pharmacy -- $100 a month, which he finds prohibitive.  Discussed B complex as a potential alternative, with no guarantee it will deliver well enough.  Has B12 unopened at home.  Admits his overall nutrition is crummy -- usually has instant oatmeal in the morning, maybe an omelette or more likely a frozen entree warmed up in a skillet for lunch, large bowl of ice cream and milk at supper for a makeshift shake, and often a can of frosting with peanut butter before bed.  Confirmed that this way of eating is short on nutrients and protein, along on carbs, and certainly can contribute to ill effects neurologically.  Discussed at some length timing and alternatives, emphasizing ways of getting protein earliest in his day and trying to garner more vegetables.  Also discussed sleep schedule,  which includes widely varying bedtimes and substantial late eating.  Encouraged to enact a calorie curfew and regulate bedtime in order to tone down carb cravings and motivations to nap long periods. ? ?Therapeutic modalities: Cognitive Behavioral Therapy and Solution-Oriented/Positive Psychology ? ?Mental Status/Observations: ? ?Appearance:   Neat     ?Behavior:  Appropriate  ?Motor:  Normal  ?Speech/Language:   Less affected, fluent  ?Affect:  Appropriate  ?Mood:  depressed  ?Thought process:  normal  ?Thought content:    Obsessions  ?Sensory/Perceptual disturbances:    WNL  ?Orientation:  grossly intact  ?Attention:  Good  ?  ?Concentration:  Good  ?Memory:  grossly intact  ?Insight:    Fair  ?Judgment:   Fair  ?Impulse Control:  Fair  ? ?Risk Assessment: ?Danger to Self: No Self-injurious Behavior: No ?Danger to Others: No Physical Aggression / Violence: No ?Duty to Warn: No Access to Firearms a concern: No ? ?Assessment of progress:  stabilized ? ?Diagnosis: ?  ICD-10-CM   ?1. Major depressive disorder, recurrent episode, moderate (HCC)  F33.1   ?  ?2. 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  ?3. Generalized anxiety disorder  F41.1   ?  ?4. Insomnia due to other mental disorder  F51.05   ? F99   ?  ?5. Heterozygous MTHFR mutation C677T  Z15.89   ?  ?6. Long-term current use of benzodiazepine  Z79.899   ?  ?7. Social anxiety disorder  F40.10   ?  ?8. r/o MCI, predementia vs. nutritional, stress, and medication effects  R69   ?  ? ?Plan:  ?If not Cerefolin, at least try B complex (or B12 on hand), in the morning ?Recommend move protein to the morning for a better metabolic tone-setting ?Reduce overall sugar, including portion of ice cream at night ?Reintroduce vegetables -- even if it's V8, just no more than 2 in a day, preferably 1 ?Introduce calorie curfew and try to cut off eating at least an hour, preferably 2, before bedtime ?Try to regulate bedtime for more stability ?Try to reduce afternoon napping to 1 hour if  possible ?Likely defer any further medication weaning until nutrition and circadian rhythms are restabilized.  Acknowledged that he needs emotionally to concentrate on fewer changes at once. ?Other recommendations/advice as may be noted above ?Continue to utilize previously learned skills ad lib ?Maintain medication as prescribed and work faithfully with relevant prescriber(s) if any changes are desired or seem indicated ?Call the clinic on-call service, 988/hotline, 911, or present to [facility] or ER if any life-threatening psychiatric crisis ?Return for session(s) already scheduled. ?Already scheduled visit in this office 05/19/2021. ? ?Blanchie Serve, PhD ?[name], PhD LP ?Clinical Psychologist, [facility] ?[facility] ?[address], [address] ?[place], [place] [address] ?(o) [phone] ?

## 2021-05-19 ENCOUNTER — Ambulatory Visit (INDEPENDENT_AMBULATORY_CARE_PROVIDER_SITE_OTHER): Payer: Medicare Other | Admitting: Psychiatry

## 2021-05-19 DIAGNOSIS — F4010 Social phobia, unspecified: Secondary | ICD-10-CM | POA: Diagnosis not present

## 2021-05-19 DIAGNOSIS — R69 Illness, unspecified: Secondary | ICD-10-CM

## 2021-05-19 DIAGNOSIS — F411 Generalized anxiety disorder: Secondary | ICD-10-CM

## 2021-05-19 DIAGNOSIS — F331 Major depressive disorder, recurrent, moderate: Secondary | ICD-10-CM | POA: Diagnosis not present

## 2021-05-19 DIAGNOSIS — F341 Dysthymic disorder: Secondary | ICD-10-CM | POA: Diagnosis not present

## 2021-05-19 NOTE — Progress Notes (Signed)
Psychotherapy Progress Note [facility] [name], PhD LP  Patient ID: Nicko Daher [facility] [name])    MRN: [id number] Therapy format: Individual psychotherapy Date: 05/19/2021      Start: 9:15a     stop: 10:03a     Time Spent: 48 min Location: In-person   Session narrative (presenting needs, interim history, self-report of stressors and symptoms, applications of prior therapy, status changes, and interventions made in session) Briefly reviewed diagnosis on request.  Recapped his story getting to [facility] -- Oct 2021, moved when rent was raised sharply in [place], found affordable housing here.  Lives in a predominantly minority Black and Muslim area.  Clear minority white, and clearly older than neighbors.  When in [place], no real friends there, either.  Limited friendships in previous places.  Probed his interests for connecting -- interests in classical music, and opera, theater -- but overall feels given-up about making connections.  Willing to try making connections if connections can be provided.  Tentatively offered to look into who might be able to connect him to these things.  Wishes a year from now would be to be involved in a church without feeling or being overwhelmed with volunteer responsibilities.  Discussed his inherent temptation to overcommit in order to hurry up and fit in and/or feel like he is pulling his weight, as a "good boy" following his mother's example.  Did attend [employer] Sunday, after meeting with a lay contact, found it friendly and open, but very aged.  On reflection today, it is too small and too gray to suit him.  Discussed other options and progress, such as [employer]  in [place] and an [employer] in [place].  Somewhat better rested.  Says he tosses and turns still, but dreams have calmed down as Luvox and amitriptyline have settled in, and Zoloft and trazodone have removed.  Says he has been  demotivated overall.  In general, feels too old, too changed, too isolated, too past his prime.  Supportively confronted conclusion jumping and encouraged further in self-care and social exploration.  Continues to view pornography on a regular basis, despite, and to some extent because of, his erectile dysfunction and inability to climax.  Allowed discretion, but suggested it may be a form of self-torture, too.  Working V8 juice into his diet   In view of his mood, notes prolonged grief issues losing three most central elders in his life 2007-2017 (father, aunt, mother).  Acknowledged what seems to be more about grief on the surface, but is fundamentally about an abiding threat of loneliness in his life, a threat made to more immediate by the loss of family members he could always turn to before.  Further encouraged in giving himself the chance, as his deceased loved ones would want for him, to make friends, find support system, fit in usefully, etc.  Therapeutic modalities: Cognitive Behavioral Therapy and Solution-Oriented/Positive Psychology  Mental Status/Observations:  Appearance:   Neat     Behavior:  Appropriate  Motor:  Normal  Speech/Language:   Clear and Coherent  Affect:  Appropriate  Mood:  dysthymic  Thought process:  normal  Thought content:    Negative thinking  Sensory/Perceptual disturbances:    WNL  Orientation:  Fully oriented  Attention:  Good    Concentration:  Good  Memory:  WNL  Insight:    Good  Judgment:   Fair  Impulse Control:  Fair   Risk Assessment: Danger to Self: No Self-injurious Behavior: No  Danger to Others: No Physical Aggression / Violence: No Duty to Warn: No Access to Firearms a concern: No  Assessment of progress:  progressing  Diagnosis:   ICD-10-CM   1. Major depressive disorder, recurrent episode, moderate (HCC)  F33.1     2. 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 Social anxiety disorder  F40.10     4. Generalized anxiety disorder  F41.1      5. r/o MCI, predementia vs. nutritional, stress, and medication effects  R69      Plan:  Continue church shopping, permission to shop as needed, no obligation, prioritize what seems to fit him Encourage curbing porn use, but allow discretion Try to regulate bedtime for more stability Try to reduce afternoon napping to 1 hour if possible If not Cerefolin, at least try B complex (or B12 on hand), in the morning Recommend move protein to the morning for a better metabolic tone-setting Reduce overall sugar, including portion of ice cream at night Reintroduce vegetables -- even if it's V8, just no more than 2 in a day, preferably 1 Introduce calorie curfew and try to cut off eating at least an hour, preferably 2, before bedtime Ensure clear instructions and use of medications -- this combination seems to be doing better Likely defer any further sedative weaning until nutrition and circadian rhythms are restabilized.  Acknowledged that he needs emotionally to concentrate on fewer changes at once. TX will check with a contact about providing a local, experienced guide to gay-friendly and culturally relevant social outlets Come back to financial issues Other recommendations/advice as may be noted above Continue to utilize previously learned skills ad lib Maintain medication as prescribed and work faithfully with relevant prescriber(s) if any changes are desired or seem indicated Call the clinic on-call service, 988/hotline, 911, or present to [facility] or ER if any life-threatening psychiatric crisis Return for session(s) already scheduled. Already scheduled visit in this office 05/27/2021.  Blanchie Serve, PhD [name], PhD LP Clinical Psychologist, [facility] [address] [place], [place] [address] [phone]

## 2021-05-27 ENCOUNTER — Ambulatory Visit (INDEPENDENT_AMBULATORY_CARE_PROVIDER_SITE_OTHER): Payer: Medicare Other | Admitting: Psychiatry

## 2021-05-27 DIAGNOSIS — F99 Mental disorder, not otherwise specified: Secondary | ICD-10-CM

## 2021-05-27 DIAGNOSIS — F331 Major depressive disorder, recurrent, moderate: Secondary | ICD-10-CM | POA: Diagnosis not present

## 2021-05-27 DIAGNOSIS — Z1589 Genetic susceptibility to other disease: Secondary | ICD-10-CM

## 2021-05-27 DIAGNOSIS — F4010 Social phobia, unspecified: Secondary | ICD-10-CM | POA: Diagnosis not present

## 2021-05-27 DIAGNOSIS — F341 Dysthymic disorder: Secondary | ICD-10-CM | POA: Diagnosis not present

## 2021-05-27 DIAGNOSIS — F411 Generalized anxiety disorder: Secondary | ICD-10-CM | POA: Diagnosis not present

## 2021-05-27 DIAGNOSIS — R37 Sexual dysfunction, unspecified: Secondary | ICD-10-CM

## 2021-05-27 DIAGNOSIS — R69 Illness, unspecified: Secondary | ICD-10-CM

## 2021-05-27 DIAGNOSIS — F5105 Insomnia due to other mental disorder: Secondary | ICD-10-CM

## 2021-05-27 DIAGNOSIS — Z79899 Other long term (current) drug therapy: Secondary | ICD-10-CM

## 2021-05-27 NOTE — Progress Notes (Signed)
Psychotherapy Progress Note [facility] [name], PhD LP  Patient ID: [facility] [name])    MRN: [id number] Therapy format: Individual psychotherapy Date: 05/27/2021      Start: 8:14a     Stop: 9:03a     Time Spent: 49 min Location: In-person   Session narrative (presenting needs, interim history, self-report of stressors and symptoms, applications of prior therapy, status changes, and interventions made in session) "Something is very off."  Now sleeping afternoons a lot.  Brings his Chromebook, can't work out how to put in a Holiday representative.  Collaborated, but unable to figure out how.  Suggested obtain tech help through store or a known computer repair shop.  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  Been going on 2 years unable to perform, attrib to medication.  Does sleep 2-3 hrs each afternoon.  Now says he has 24/7 heartburn and very dry throat, attrib to amitriptyline.  Is drinking V8, maybe several times a day.  Takes sleep med (amitriptyline) at varying times, acknowledges reading in bed and falling asleep after lunch, darkened room.  Advised about regulating med timing, water, and amount of sodium-laden veg juice.  Feeling stuck, like a fool, lonely, for telling the [facility] rep he would be going another direction.  Advised that it should be no problem to change his mind, OK to say if he was hasty and is changing his mind.  Sunday had a long crying jag noticing all the pictures of dead people in his home.  Packed up the pictures, but feels foolish doing that much.  Reframed as an attempt to control triggers to sadness, and OK to manage his environment to manage his feelings.  Encouraged to galvanize around a couple of purposes and especially regulate physiological influences including sleep, diet, and meds so that he can feel more resilient to make other choices.  Therapeutic modalities: Cognitive Behavioral Therapy,  Solution-Oriented/Positive Psychology, and Ego-Supportive  Mental Status/Observations:  Appearance:   Casual     Behavior:  Agitated  Motor:  Normal  Speech/Language:   Clear and Coherent  Affect:  Appropriate  Mood:  anxious and dysthymic  Thought process:  Some flight  Thought content:    worry  Sensory/Perceptual disturbances:    WNL  Orientation:  Fully oriented  Attention:  Good    Concentration:  Fair  Memory:  grossly intact  Insight:    Fair  Judgment:   Fair  Impulse Control:  Fair   Risk Assessment: Danger to Self: No Self-injurious Behavior: No Danger to Others: No Physical Aggression / Violence: No Duty to Warn: No Access to Firearms a concern: No  Assessment of progress:  situational setback(s)  Diagnosis:   ICD-10-CM   1. Major depressive disorder, recurrent episode, moderate (HCC)  F33.1     2. 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 Social anxiety disorder  F40.10     4. Generalized anxiety disorder  F41.1     5. r/o MCI, predementia vs. nutritional, stress, and medication effects  R69     6. Insomnia due to other mental disorder  F51.05    F99     7. Heterozygous MTHFR mutation C677T  Z15.89     8. Long-term current use of benzodiazepine  Z79.899     9. 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  Could well be medication.  If medically OK, may seek Rx for ED med from psychiatry, PCP, or urology. Walking, emphasize morning Continue church shopping, permission to shop as needed, no obligation, prioritize what seems to fit him Encourage curbing porn use, but allow discretion Try to regulate bedtime for more stability Try to reduce afternoon napping to 1 hour if possible If not Cerefolin, at least try B complex (or B12 on hand), in the morning Recommend move protein to the morning for a better metabolic tone-setting Reduce overall sugar, including portion of ice cream at  night Reintroduce vegetables -- even if it's V8, just no more than 2 in a day, preferably 1 Introduce calorie curfew and try to cut off eating at least an hour, preferably 2, before bedtime Ensure clear instructions and use of medications -- this combination seems to be doing better Likely defer any further sedative weaning until nutrition and circadian rhythms are restabilized.  Acknowledged that he needs emotionally to concentrate on fewer changes at once. [place] will check with a contact about providing a local, experienced guide to gay-friendly and culturally relevant social outlets Come back to financial issues Other recommendations/advice as may be noted above Continue to utilize previously learned skills ad lib Maintain medication as prescribed and work faithfully with relevant prescriber(s) if any changes are desired or seem indicated Call the clinic on-call service, 988/hotline, 911, or present to [facility] or ER if any life-threatening psychiatric crisis Return for session(s) already scheduled. Already scheduled visit in this office 06/03/2021.  Blanchie Serve, PhD [name], PhD LP Clinical Psychologist, [facility] [address], [place], [place] [address] [phone]

## 2021-05-27 NOTE — Patient Instructions (Addendum)
Take-home messages from today: ? ?Segregate sleep from waking -- read out of bed, do something else after lunch ?Seek some physical stimulus to be wakeful -- walking, especially in the morning, and if you miss early morning, don't write off midmorning or even after lunch -- any movement helps almost every problem we're dealing with ?Take sleep mediation at a consistent time, like 11pm, even if you figure on staying up till 2 (but better if you don't stay up) ?For ED, your best options are to ask one of your doctors for an erection-assisting pill (e.g., generic Viagra) and go to a pharmacy that offers $4 generics (e.g., [facility], but ask [facility] what their generic cost would be -- it might be OK).  Helene Kelp might be able to prescribe, but probably PCP or urology. ?Nutritionally, getting more of your calories from protein, and less from carbs, can help ED and mood and heartburn, all. ?It is absolutely OK to [employer].  It is not a fatal mistake to tell them you were interested in [place]. ?

## 2021-05-28 ENCOUNTER — Other Ambulatory Visit: Payer: Self-pay | Admitting: Physician Assistant

## 2021-06-02 ENCOUNTER — Telehealth: Payer: Self-pay | Admitting: Physician Assistant

## 2021-06-02 NOTE — Telephone Encounter (Signed)
He called after hours service last night.  Reporting feeling like someone he knows is in the next room in his apartment.  But he knows that is not true.  Just feels very uncomfortable about the situation.  This started yesterday.  Denies hallucinations.  No suicidal thoughts.  No reports of illicit drug use.  Wants to discuss fact he is unable to find a church.  Did not discuss as not an emergent issue.  I advised him to take his evening meds at that time, approximately 8 PM.  If hallucinations or suicidal ideation occurs, go to [facility] health, call [phone], go to [employer], ER, or call office number again.  He has follow-up appointment with me 06/08/2021. ?

## 2021-06-03 ENCOUNTER — Other Ambulatory Visit: Payer: Self-pay | Admitting: Physician Assistant

## 2021-06-03 ENCOUNTER — Ambulatory Visit (INDEPENDENT_AMBULATORY_CARE_PROVIDER_SITE_OTHER): Payer: Medicare Other | Admitting: Psychiatry

## 2021-06-03 DIAGNOSIS — F99 Mental disorder, not otherwise specified: Secondary | ICD-10-CM

## 2021-06-03 DIAGNOSIS — R69 Illness, unspecified: Secondary | ICD-10-CM

## 2021-06-03 DIAGNOSIS — F5105 Insomnia due to other mental disorder: Secondary | ICD-10-CM | POA: Diagnosis not present

## 2021-06-03 DIAGNOSIS — Z79899 Other long term (current) drug therapy: Secondary | ICD-10-CM

## 2021-06-03 DIAGNOSIS — F341 Dysthymic disorder: Secondary | ICD-10-CM

## 2021-06-03 DIAGNOSIS — Z1589 Genetic susceptibility to other disease: Secondary | ICD-10-CM

## 2021-06-03 DIAGNOSIS — F4010 Social phobia, unspecified: Secondary | ICD-10-CM | POA: Diagnosis not present

## 2021-06-03 DIAGNOSIS — F331 Major depressive disorder, recurrent, moderate: Secondary | ICD-10-CM | POA: Diagnosis not present

## 2021-06-03 NOTE — Progress Notes (Signed)
Psychotherapy Progress Note ?[facility] ?[name], PhD LP ? ?Patient ID: [facility] [name])    MRN: [id number] ?Therapy format: Individual psychotherapy ?Date: 06/03/2021      Start: 9:15a     Stop: 10:02a     Time Spent: 47 min ?Location: In-person  ? ?Session narrative (presenting needs, interim history, self-report of stressors and symptoms, applications of prior therapy, status changes, and interventions made in session) ?Wants to discuss his sexual urges, albeit embarrassing.  Daily gay porn, mixed results, never sexually satisfying, but feels it does deliver something better than the boredom he would otherwise know.  Discussed at some length principles involved in how to tell if it is benign or pathological, settling on routine frustration, objectification of men, perpetuating social anxiety, stumbling into disgusting content, and possibly replaying traumatic experience as the main drawbacks.  Affirmed courage in bringing it up, and interpreted porn functioning as a Retail banker for actual relationships, including platonic. ? ?Repeats he has taken down all the pictures of deceased adult relatives he had up, which is some help trimming back the demoralizing influence.  Reaffirmed that what used to be comfort had become more of a soaking reminder of loneliness.  Refocused on church-finding, reviewing choices, strategy visiting, motivation to go "shop", and resisting the urge to precommit to working a role just to satisfy the urge to feel like he is accepted. ? ?Re. spending, decided on the more economical silver plate purchase, and very much likes what he got for the cost.   ? ?Nutritionally, is eating more meat now, and heating it.  Finding it has helped with his persistent heartburn, has almost none now.  Also nailed down 10pm dosing for night meds through the past week.  Finding that it does help him nail down a more stable sleep pattern as well, and not as much late  night staying up.  Called his psychiatrist while on call earlier in the week, noted to have tried to go into nonemergency complaints, seemingly in a bid for comfort and attention. ? ?Has stopped trazodone, which stopped the vivid, bizarre dreams, to his relief (repeat revelation). ? ?Therapeutic modalities: Cognitive Behavioral Therapy and Solution-Oriented/Positive Psychology ? ?Mental Status/Observations: ? ?Appearance:   Casual     ?Behavior:  Appropriate  ?Motor:  Normal  ?Speech/Language:   Clear and Coherent  ?Affect:  Appropriate and more subdued  ?Mood:  dysthymic  ?Thought process:  normal  ?Thought content:    WNL  ?Sensory/Perceptual disturbances:    WNL  ?Orientation:  Fully oriented  ?Attention:  Good  ?  ?Concentration:  Good  ?Memory:  grossly intact  ?Insight:    Good  ?Judgment:   Fair  ?Impulse Control:  Fair  ? ?Risk Assessment: ?Danger to Self: No Self-injurious Behavior: No ?Danger to Others: No Physical Aggression / Violence: No ?Duty to Warn: No Access to Firearms a concern: No ? ?Assessment of progress:  progressing ? ?Diagnosis: ?  ICD-10-CM   ?1. Major depressive disorder, recurrent episode, moderate (HCC)  F33.1   ?  ?2. 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  ?3. Social anxiety disorder  F40.10   ?  ?4. Insomnia due to other mental disorder  F51.05   ? F99   ?  ?5. Long-term current use of benzodiazepine  Z79.899   ?  ?6. Heterozygous MTHFR mutation C677T  Z15.89   ?  ?7. r/o MCI, predementia vs. nutritional, stress, and medication effects  R69   ?  ? ?  Plan:  ?Continue improved nutrition, protein early, and at least B complex supplementation if not Cerefolin ?Continue stabilizing bedtime and limiting staying up ?Continue trying out churches until find one suitable to learn more about, meet people ?Continue to brainstorm options for finding people with compatible interests here in [place] ?Continue to prioritize physiological and circadian regulation before trying again to wean  benzodiazepine ?Other recommendations/advice as may be noted above ?Continue to utilize previously learned skills ad lib ?Maintain medication as prescribed and work faithfully with relevant prescriber(s) if any changes are desired or seem indicated ?Call the clinic on-call service, 988/hotline, 911, or present to [facility] or ER if any life-threatening psychiatric crisis ?Return for session(s) already scheduled. ?Already scheduled visit in this office 06/08/2021. ? ?Blanchie Serve, PhD ?[name], PhD LP ?Clinical Psychologist, [facility] ?[facility] ?[address] ?[place], [place] [address] ?(o) [phone] ?

## 2021-06-08 ENCOUNTER — Encounter: Payer: Self-pay | Admitting: Physician Assistant

## 2021-06-08 ENCOUNTER — Ambulatory Visit (INDEPENDENT_AMBULATORY_CARE_PROVIDER_SITE_OTHER): Payer: Medicare Other | Admitting: Family Medicine

## 2021-06-08 ENCOUNTER — Encounter: Payer: Self-pay | Admitting: Family Medicine

## 2021-06-08 ENCOUNTER — Ambulatory Visit (INDEPENDENT_AMBULATORY_CARE_PROVIDER_SITE_OTHER): Payer: Medicare Other | Admitting: Physician Assistant

## 2021-06-08 VITALS — BP 126/74 | HR 100 | Temp 97.8°F | Ht 69.0 in | Wt 178.2 lb

## 2021-06-08 DIAGNOSIS — R252 Cramp and spasm: Secondary | ICD-10-CM | POA: Diagnosis not present

## 2021-06-08 DIAGNOSIS — F330 Major depressive disorder, recurrent, mild: Secondary | ICD-10-CM

## 2021-06-08 DIAGNOSIS — M25552 Pain in left hip: Secondary | ICD-10-CM

## 2021-06-08 DIAGNOSIS — F4010 Social phobia, unspecified: Secondary | ICD-10-CM

## 2021-06-08 DIAGNOSIS — G251 Drug-induced tremor: Secondary | ICD-10-CM

## 2021-06-08 DIAGNOSIS — F99 Mental disorder, not otherwise specified: Secondary | ICD-10-CM

## 2021-06-08 DIAGNOSIS — G252 Other specified forms of tremor: Secondary | ICD-10-CM | POA: Diagnosis not present

## 2021-06-08 DIAGNOSIS — F5105 Insomnia due to other mental disorder: Secondary | ICD-10-CM | POA: Diagnosis not present

## 2021-06-08 MED ORDER — NAPROXEN 500 MG PO TABS: 500.0000 mg | ORAL_TABLET | Freq: Two times a day (BID) | ORAL | 0 refills | Status: DC

## 2021-06-08 NOTE — Patient Instructions (Signed)
Decrease Lamictal to 100 mg, 1 1/2 pill daily ?

## 2021-06-08 NOTE — Progress Notes (Signed)
[facility] ? ?Patient ID: Richard Wilson,  ?MRN: [id number] ? ?PCP: Haydee Salter, MD ? ?Date of Evaluation: 06/08/2021 ?Tme spent:30 minutes ? ?Chief Complaint:  ?Chief Complaint   ?Insomnia; Anxiety; Depression; Follow-up ?  ? ? ?HISTORY/CURRENT STATUS: ?HPI For routine med check. ? ?Still does not enjoy much of anything.  States he has no friends.  Energy and motivation are fair to good depending on the day.  ADLs and personal hygiene are normal.  Still gets very anxious if he does not take the Klonopin.  No suicidal or homicidal thoughts. ? ?Sleep is some better, will wake up after about 5-6 hours, usually can't go back to sleep. Feels more rested now since we started the Elavil. Also takes a 1.5-2 hour nap during the day.  ? ?Still frustrated because of issues at church, no one else does anything.  It does not get done if he doesn't things.  Has been walking for exercise occasionally, in the past week. Eats pretty healthy.  ? ?Patient denies increased energy with decreased need for sleep, no increased talkativeness, no racing thoughts, no impulsivity or risky behaviors, no increased spending, no grandiosity, no increased irritability or anger. Since the last visit he called after our stating he had a sense that someone else is in the next room.  It still happens occasionally, he does not understand why.  He knows logically that no one is there.  He never sees or hears anything though. ? ?Complains of tremor in hands bilaterally but right is worse than the left, he is right-handed.  Has gotten worse since we increased the Lamictal last month.  Denies dizziness, syncope, seizures, numbness, tingling, tics, unsteady gait, slurred speech, confusion. Denies muscle or joint pain, stiffness, or dystonia. Denies fever, sweating, tremor, or diarrhea. ? ?Individual Medical History/ Review of Systems: Changes? :No    ? ?Past medications for mental health diagnoses include: ?Rozerem, Trazodone caused vivid  dreams, Xanax, Buspar, mirtazapine caused excessive somnolence and fatigue with flulike symptoms, Belsomra, propranolol for tremor, Zoloft, Abilify caused tremor, Klonopin, Effexor, Ativan, Wellbutrin, Nardil, gabapentin, Librium ?  ?Pertinent info after reviewing Dr. Evelena Leyden notes, his previous psychiatrist in [facility]. ?Past suicide attempt in September 1996, by taking Tylenol.  He was admitted at that time to a psychiatric hospital in [facility].  Admitted to psych hospital multiple times in the past for "nervous breakdowns."  January 1985, February 1986, October 1991, September 1996, July 2000. ? ?Allergies: Garamycin [gentamicin] and Penicillins ? ?Current Medications:  ?Current Outpatient Medications:  ?  amitriptyline (ELAVIL) 25 MG tablet, TAKE 1 TABLET BY MOUTH EVERYDAY AT BEDTIME, Disp: 90 tablet, Rfl: 0 ?  aspirin EC 81 MG tablet, Take 81 mg by mouth daily. Swallow whole., Disp: , Rfl:  ?  BioGaia Probiotic (BIOGAIA/GERBER SOOTHE) LIQD, Take 5 drops by mouth daily at 8 pm., Disp: , Rfl:  ?  busPIRone (BUSPAR) 30 MG tablet, TAKE 1 TABLET BY MOUTH 2 TIMES DAILY., Disp: 180 tablet, Rfl: 1 ?  clonazePAM (KLONOPIN) 1 MG tablet, Take 1 tablet (1 mg total) by mouth 3 (three) times daily as needed for anxiety. Hold until he requests, he will need filled earlier than expected b/c we increased dose to tid today, 04/22/2021., Disp: 90 tablet, Rfl: 1 ?  clotrimazole-betamethasone (LOTRISONE) cream, Apply 1 application topically daily., Disp: 30 g, Rfl: 0 ?  fluvoxaMINE (LUVOX) 100 MG tablet, Take 2 tablets (200 mg total) by mouth at bedtime., Disp: 60 tablet,  Rfl: 1 ?  lamoTRIgine (LAMICTAL) 100 MG tablet, Take 1 tablet (100 mg total) by mouth 2 (two) times daily., Disp: 60 tablet, Rfl: 1 ?  Methylfol-Algae-B12-Acetylcyst (CEREFOLIN NAC) 6-90.314-2-600 MG TABS, Take 1 tablet by mouth daily., Disp: 30 tablet, Rfl: 5 ?  mometasone (ELOCON) 0.1 % cream, Apply topically in the  morning and at bedtime., Disp: 15 g, Rfl: 1 ?  naproxen (NAPROSYN) 500 MG tablet, TAKE 1 TABLET BY MOUTH 2 TIMES DAILY WITH A MEAL., Disp: 30 tablet, Rfl: 0 ?  psyllium (METAMUCIL) 58.6 % powder, Take 1 packet by mouth in the morning and at bedtime., Disp: , Rfl:  ?Medication Side Effects: sexual dysfunction ? ?Family Medical/ Social History: Changes?  no ? ?MENTAL HEALTH EXAM: ? ?There were no vitals taken for this visit.There is no height or weight on file to calculate BMI.  ?General Appearance: Casual and Well Groomed  ?Eye Contact:  Good  ?Speech:  Clear and Coherent, Normal Rate, and Talkative  ?Volume:  Normal  ?Mood:  Euthymic  ?Affect:  Congruent  ?Thought Process:  Goal Directed and Descriptions of Associations: Circumstantial  ?Orientation:  Full (Time, Place, and Person)  ?Thought Content: Logical   ?Suicidal Thoughts:  No  ?Homicidal Thoughts:  No  ?Memory:  WNL  ?Judgement:  Good  ?Insight:  Good  ?Psychomotor Activity:  Normal  ?Concentration:  Concentration: Good and Attention Span: Good  ?Recall:  Good  ?Fund of Knowledge: Good  ?Language: Good  ?Assets:  Desire for Improvement  ?ADL's:  Intact  ?Cognition: WNL  ?Prognosis:  Good  ? ?Gene site test results are on chart under media. ?See neuro psych test results on chart.. ? ?DIAGNOSES:  ?  ICD-10-CM   ?1. Insomnia due to other mental disorder  F51.05   ? F99   ?  ?2. Social anxiety disorder  F40.10   ?  ?3. Mild episode of recurrent major depressive disorder (HCC)  F33.0   ?  ?4. 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  ? ? ? ?Receiving Psychotherapy: Yes    Dr. Jonni Sanger Mitchum.  ? ? ?RECOMMENDATIONS:  ?PDMP was reviewed.  Klonopin last filled 05/18/2021. ?I provided 30 minutes of face to face time during this encounter, including time spent before and after the visit in records review, medical decision making, counseling pertinent to today's visit, and charting.  ?Discussed the tremor.  Since he thinks it worsened when the increase Lamictal will decrease it back  as it does not seem to have helped his mood anyway.  Much of what is causing the depression and anxiety is circumstantial.  ?Again discussed sleep hygiene.  Napping during the day does prevent good sleep at night but if he is happy with 6 hours of sleep at night and then 1-1/2 to 2 hours in the afternoon then that is fine, that is certainly better than it has been in the past. ?As far as having the sense that someone is in the next room, I am not sure exactly what is going on there.  He is not having nightmares or hallucinations other than that described.  We will consider adding Seroquel if needed. ?Otherwise no changes. ? ?Continue amitriptyline 25 mg, 1 p.o. nightly. ?Continue BuSpar 30 mg, 1 p.o. twice daily. ?Continue Klonopin 1 mg, 1 p.o. 3 times daily as needed. ?Continue Luvox 100 mg, 2 p.o. nightly. ?Decrease Lamictal back down to a total of 150 mg. ?Continue Cerafolin NAC, 1 p.o. daily. ?Continue counseling with Dr. [name]. ?  Return in 4-6 wks.  ? ?Donnal Moat, PA-C  ?

## 2021-06-08 NOTE — Progress Notes (Signed)
?[facility] ?[facility] ?[place] ?[place] [address] ?Dept: [phone] ?Dept Fax: [phone] ? ?Office Visit ? ?Subjective:  ? ? Patient ID: Richard Wilson, male    DOB: 1951/01/23, 71 y.o..   MRN: [id number] ? ?Chief Complaint  ?Patient presents with  ? Acute Visit  ?  C/o having LT hip pain and lower leg cramping of/on.      ? ? ?History of Present Illness: ? ?Patient is in today for with several complaints. ? ?Mr. Richard Wilson notes periodically at night he feels like his calf muscles are about to cramp. He admits that they never actually develop into a charley horse. He finds this ot be an unsettling sensation. ? ?Mr. Richard Wilson states he has been having some left hip pain. He notes this started after he started a new exercise routine, walking for 25 minutes two days in a row. He had not been doing walking prior to this.  Mr. Richard Wilson was seen by Dr. Erlinda Wilson (orthopedics) last Nov. with right hip pain that was felt to be arthritis. He notes that he did not take any specific action for the left hip pain. ? ?Mr. Richard Wilson complains of a tremor that he has when holding things. He ha not noted this at rest. He drinks about 3-4 cps of tea a day, but does not consume coffee or other caffeinated drinks. ? ?Past Medical History: ?Patient Active Problem List  ? Diagnosis Date Noted  ? Mild cognitive impairment of uncertain or unknown etiology 01/21/2021  ? Intention tremor   ? Chronic idiopathic constipation 10/23/2020  ? Orthostatic lightheadedness 10/23/2020  ? Erectile dysfunction 10/23/2020  ? Hyperglycemia 09/24/2020  ? 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  ? 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 Thrombocytopenia 04/08/2020  ? Generalized anxiety disorder 01/01/2020  ? Major depressive disorder 01/01/2020  ? Primary osteoarthritis of both hips 07/30/2019  ? Chronic kidney disease, stage 3a 03/19/2019  ? Hyperlipidemia 12/15/2018  ? Arthritis of knee 08/31/2017  ? Nephrolithiasis 06/22/2016  ?  Obstructive sleep apnea 04/13/2015  ? Diverticulosis of large intestine without hemorrhage 09/30/2013  ? Mild mitral regurgitation 08/15/2013  ? Diastolic dysfunction 28/36/6294  ? Elevated PSA 12/22/2012  ? RBBB (right bundle branch block) 12/03/2012  ? ?Past Surgical History:  ?Procedure Laterality Date  ? APPENDECTOMY    ? FOOT TENDON SURGERY Right   ? TONSILLECTOMY    ? ?Family History  ?Problem Relation Age of Onset  ? Cancer Mother   ?     Ovarian  ? Heart disease Mother   ? Memory loss Mother   ?     with advanced age  ? Prostate cancer Father   ? Throat cancer Maternal Grandfather   ? Heart attack Paternal Grandfather   ? Cancer Maternal Aunt   ?     Breast, metastatic  ? ?Outpatient Medications Prior to Visit  ?Medication Sig Dispense Refill  ? amitriptyline (ELAVIL) 25 MG tablet TAKE 1 TABLET BY MOUTH EVERYDAY AT BEDTIME 90 tablet 0  ? BioGaia Probiotic (BIOGAIA/GERBER SOOTHE) LIQD Take 5 drops by mouth daily at 8 pm.    ? busPIRone (BUSPAR) 30 MG tablet TAKE 1 TABLET BY MOUTH 2 TIMES DAILY. 180 tablet 1  ? clonazePAM (KLONOPIN) 1 MG tablet Take 1 tablet (1 mg total) by mouth 3 (three) times daily as needed for anxiety. Hold until he requests, he will need filled earlier than expected b/c we increased dose to tid today, 04/22/2021. 90 tablet 1  ? clotrimazole-betamethasone (  LOTRISONE) cream Apply 1 application topically daily. 30 g 0  ? fluvoxaMINE (LUVOX) 100 MG tablet Take 2 tablets (200 mg total) by mouth at bedtime. 60 tablet 1  ? lamoTRIgine (LAMICTAL) 150 MG tablet Take 150 mg by mouth daily.    ? Methylfol-Algae-B12-Acetylcyst (CEREFOLIN NAC) 6-90.314-2-600 MG TABS Take 1 tablet by mouth daily. 30 tablet 5  ? mometasone (ELOCON) 0.1 % cream Apply topically in the morning and at bedtime. 15 g 1  ? psyllium (METAMUCIL) 58.6 % powder Take 1 packet by mouth in the morning and at bedtime.    ? sertraline (ZOLOFT) 100 MG tablet Take 200 mg by mouth daily.    ? lamoTRIgine (LAMICTAL) 100 MG tablet Take 1  tablet (100 mg total) by mouth 2 (two) times daily. 60 tablet 1  ? naproxen (NAPROSYN) 500 MG tablet TAKE 1 TABLET BY MOUTH 2 TIMES DAILY WITH A MEAL. 30 tablet 0  ? aspirin EC 81 MG tablet Take 81 mg by mouth daily. Swallow whole. (Patient not taking: Reported on 06/08/2021)    ? ?No facility-administered medications prior to visit.  ? ?Allergies  ?Allergen Reactions  ? Garamycin [Gentamicin] Other (See Comments)  ?  Eyes itched and were edematous  ? Penicillins   ?  Pt reports is childhood allergy and does not remember the reaction  ?  ?Objective:  ? ?Today's Vitals  ? 06/08/21 1356  ?BP: 126/74  ?Pulse: 100  ?Temp: 97.8 ?F (36.6 ?C)  ?TempSrc: Temporal  ?SpO2: 95%  ?Weight: 178 lb 3.2 oz (80.8 kg)  ?Height: '5\' 9"'$  (1.753 m)  ? ?Body mass index is 26.32 kg/m?.  ? ?General: Well developed, well nourished. No acute distress. ?Extremities: Left calf is soft and nontender. ?Neuro: Minor intention tremor noted. No resting tremor. ?Psych: Alert and oriented. Normal mood and affect. ? ?Health Maintenance Due  ?Topic Date Due  ? Hepatitis C Screening  Never done  ? Zoster Vaccines- Shingrix (1 of 2) Never done  ? ? ?Assessment & Plan:  ? ?1. Left hip pain ?In light of prior right hip arthritis, it is likely the left hip issues are similar. I feel his sudden change in physical activity likely led to this. I will prescribe an NSAID for him to use for 4-5 days. I will have him use heat. He shodul restart his exercise routine at more modest levels. I recommend he walk for 10 min. 3 days a week, increasing by 1 min. each week until he works up to 30 minutes each session. ? ?- naproxen (NAPROSYN) 500 MG tablet; Take 1 tablet (500 mg total) by mouth 2 (two) times daily with a meal.  Dispense: 15 tablet; Refill: 0 ? ?2. Muscle cramps at night ?Likely benign, esp. since he does not develop a full blown cramp. I will check electrolytes. I encouraged him to keep hydrated. ? ?- Basic metabolic panel ? ?3. Intention tremor ?Reassured Mr.  Wilson that this can be normal with aging. We will follow. ? ?- Basic metabolic panel ?- TSH ? ?Return if symptoms worsen or fail to improve.  ? ?Haydee Salter, MD ?

## 2021-06-09 ENCOUNTER — Telehealth: Payer: Self-pay | Admitting: Family Medicine

## 2021-06-09 LAB — BASIC METABOLIC PANEL WITH GFR
BUN: 15 mg/dL (ref 6–23)
CO2: 28 meq/L (ref 19–32)
Calcium: 9.2 mg/dL (ref 8.4–10.5)
Chloride: 104 meq/L (ref 96–112)
Creatinine, Ser: 1.47 mg/dL (ref 0.40–1.50)
GFR: 48.54 mL/min — ABNORMAL LOW (ref >60.00)
Glucose, Bld: 97 mg/dL (ref 70–99)
Potassium: 4.5 meq/L (ref 3.5–5.1)
Sodium: 141 meq/L (ref 135–145)

## 2021-06-09 LAB — TSH: TSH: 0.85 u[IU]/mL (ref 0.35–5.50)

## 2021-06-09 NOTE — Telephone Encounter (Signed)
I spoke to patient and he is schedule to see Dr Gena Fray 06/11/21 to discuss this.  Dm/cma ? ?

## 2021-06-09 NOTE — Telephone Encounter (Signed)
Pt would like to discuss a colonoscopy.  ? ? ?

## 2021-06-09 NOTE — Telephone Encounter (Signed)
Spoke to patient and he is scheduled for an appointment to come back in for concerns of blood in stool 4-5 days. Dm/cma ? ?

## 2021-06-09 NOTE — Telephone Encounter (Signed)
Pt would like to schedule the referral for his colonoscopy but would like to speak before. ?

## 2021-06-10 ENCOUNTER — Ambulatory Visit (INDEPENDENT_AMBULATORY_CARE_PROVIDER_SITE_OTHER): Payer: Medicare Other | Admitting: Psychiatry

## 2021-06-10 DIAGNOSIS — F331 Major depressive disorder, recurrent, moderate: Secondary | ICD-10-CM

## 2021-06-10 DIAGNOSIS — F4010 Social phobia, unspecified: Secondary | ICD-10-CM

## 2021-06-10 DIAGNOSIS — F99 Mental disorder, not otherwise specified: Secondary | ICD-10-CM

## 2021-06-10 DIAGNOSIS — F5105 Insomnia due to other mental disorder: Secondary | ICD-10-CM

## 2021-06-10 DIAGNOSIS — F341 Dysthymic disorder: Secondary | ICD-10-CM

## 2021-06-10 DIAGNOSIS — F411 Generalized anxiety disorder: Secondary | ICD-10-CM

## 2021-06-10 DIAGNOSIS — R37 Sexual dysfunction, unspecified: Secondary | ICD-10-CM

## 2021-06-10 DIAGNOSIS — R69 Illness, unspecified: Secondary | ICD-10-CM

## 2021-06-10 NOTE — Progress Notes (Signed)
Psychotherapy Progress Note [facility] Richard Moore, PhD LP  Patient ID: Richard Wilson [facility] "Richard Wilson")    MRN: [id number] Therapy format: Individual psychotherapy Date: 06/10/2021      Start: 9:30a     Stop: 10:20a     Time Spent: 50 min Location: In-person   Session narrative (presenting needs, interim history, self-report of stressors and symptoms, applications of prior therapy, status changes, and interventions made in session) Needs assistance with his Chromebook.  Has a subscription to a porn site he wants to cancel and can't navigate.  The network here will not allow logging onto a site like that, so referred to [employer] for technical assistance, and to his bank, [name], for assistance stopping payment.    Making arrangements for colonoscopy.  Acknowledges rectal bleeding, began last week.  PCP tomorrow, understand will need referral to GI.  Has been averse to the procedure based on being awakened last time, in [place].  Assured current procedure is seamless and unconscious, better anesthesia than was used then.  On reflection most likely just needs hemorrhoidectomy.    Hasn't attended church in 2 weeks, but would still like to try [facility] in [place].  Affirmed and encouraged.  Therapeutic modalities: Cognitive Behavioral Therapy, Solution-Oriented/Positive Psychology, and Ego-Supportive  Mental Status/Observations:  Appearance:   Casual and Neat     Behavior:  Appropriate  Motor:  Normal  Speech/Language:   Clear and Coherent  Affect:  Appropriate  Mood:  depressed  Thought process:  normal  Thought content:    Rumination  Sensory/Perceptual disturbances:    WNL  Orientation:  Fully oriented  Attention:  Good    Concentration:  Fair  Memory:  grossly intact  Insight:    Good  Judgment:   Fair  Impulse Control:  Fair   Risk Assessment: Danger to Self: No Self-injurious Behavior: No Danger to Others: No Physical Aggression /  Violence: No Duty to Warn: No Access to Firearms a concern: No  Assessment of progress:  stabilized  Diagnosis:   ICD-10-CM   1. Major depressive disorder, recurrent episode, moderate (HCC)  F33.1     2. 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 Social anxiety disorder  F40.10     4. Insomnia due to other mental disorder  F51.05    F99     5. Sexual dysfunction  R37     6. Generalized anxiety disorder  F41.1     7. r/o MCI, predementia vs. nutritional, stress, and medication effects  R69      Plan:  Endorse following through with GI assessment.  Refer to [employer] if desire a driver.   Seek tech help for eliminating computer connections to pay-porn site and walking through Endorse trying ED medication Continue improved nutrition, protein early, and at least B complex supplementation if not Cerefolin Continue stabilizing bedtime and limiting staying up Continue trying out churches until find one suitable to learn more about, meet people Continue to brainstorm options for finding people with compatible interests here in [place] to prioritize physiological and circadian regulation before trying again to wean benzodiazepine Other recommendations/advice as may be noted above Continue to utilize previously learned skills ad lib Maintain medication as prescribed and work faithfully with relevant prescriber(s) if any changes are desired or seem indicated Call the clinic on-call service, 988/hotline, 911, or present to [facility] or ER if any life-threatening psychiatric crisis Return for session(s) already scheduled. Already scheduled visit in  this office 07/03/2021.  Blanchie Serve, PhD Richard Moore, PhD LP Clinical Psychologist, [facility] [address] [place], [place] [address] [phone]

## 2021-06-11 ENCOUNTER — Encounter: Payer: Self-pay | Admitting: Family Medicine

## 2021-06-11 ENCOUNTER — Ambulatory Visit (INDEPENDENT_AMBULATORY_CARE_PROVIDER_SITE_OTHER): Payer: Medicare Other | Admitting: Family Medicine

## 2021-06-11 VITALS — BP 124/80 | HR 94 | Temp 97.8°F | Wt 175.6 lb

## 2021-06-11 DIAGNOSIS — K602 Anal fissure, unspecified: Secondary | ICD-10-CM | POA: Diagnosis not present

## 2021-06-11 DIAGNOSIS — K644 Residual hemorrhoidal skin tags: Secondary | ICD-10-CM | POA: Diagnosis not present

## 2021-06-11 MED ORDER — HYDROCORTISONE (PERIANAL) 2.5 % EX CREA: 1.0000 "application " | TOPICAL_CREAM | Freq: Two times a day (BID) | CUTANEOUS | 0 refills | Status: DC

## 2021-06-11 MED ORDER — DOCUSATE SODIUM 50 MG PO CAPS: 50.0000 mg | ORAL_CAPSULE | Freq: Two times a day (BID) | ORAL | 0 refills | Status: DC

## 2021-06-11 NOTE — Progress Notes (Signed)
?[facility] ?[facility] ?[place] ?[place] [address] ?Dept: [phone] ?Dept Fax: [phone] ? ?Office Visit ? ?Subjective:  ? ? Patient ID: Richard Wilson, male    DOB: September 22, 1950, 71 y.o..   MRN: [id number] ? ?Chief Complaint  ?Patient presents with  ? Acute Visit  ?  C/o having blood in his stool x 4-5 days.    ? ? ?History of Present Illness: ? ?Patient is in today complaining of a 5-day history of bright red rectal bleeding. He has noted blood when wiping after bowel movements. He notes a past history of hemorrhoids. He admits to some recent straining with bowel movements. He has not engaged in penetrative anal sex in quite some time. His last colonoscopy was in 2014. ? ?Past Medical History: ?Patient Active Problem List  ? Diagnosis Date Noted  ? Mild cognitive impairment of uncertain or unknown etiology 01/21/2021  ? Intention tremor   ? Chronic idiopathic constipation 10/23/2020  ? Orthostatic lightheadedness 10/23/2020  ? Erectile dysfunction 10/23/2020  ? Hyperglycemia 09/24/2020  ? 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  ? 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 Thrombocytopenia 04/08/2020  ? Generalized anxiety disorder 01/01/2020  ? Major depressive disorder 01/01/2020  ? Primary osteoarthritis of both hips 07/30/2019  ? Chronic kidney disease, stage 3a 03/19/2019  ? Hyperlipidemia 12/15/2018  ? Arthritis of knee 08/31/2017  ? Nephrolithiasis 06/22/2016  ? Obstructive sleep apnea 04/13/2015  ? Diverticulosis of large intestine without hemorrhage 09/30/2013  ? Mild mitral regurgitation 08/15/2013  ? Diastolic dysfunction 51/88/4166  ? Elevated PSA 12/22/2012  ? RBBB (right bundle branch block) 12/03/2012  ? ?Past Surgical History:  ?Procedure Laterality Date  ? APPENDECTOMY    ? FOOT TENDON SURGERY Right   ? TONSILLECTOMY    ? ?Family History  ?Problem Relation Age of Onset  ? Cancer Mother   ?     Ovarian  ? Heart disease Mother   ? Memory loss Mother   ?     with  advanced age  ? Prostate cancer Father   ? Throat cancer Maternal Grandfather   ? Heart attack Paternal Grandfather   ? Cancer Maternal Aunt   ?     Breast, metastatic  ? ? ?Outpatient Medications Prior to Visit  ?Medication Sig Dispense Refill  ? amitriptyline (ELAVIL) 25 MG tablet TAKE 1 TABLET BY MOUTH EVERYDAY AT BEDTIME 90 tablet 0  ? BioGaia Probiotic (BIOGAIA/GERBER SOOTHE) LIQD Take 5 drops by mouth daily at 8 pm.    ? busPIRone (BUSPAR) 30 MG tablet TAKE 1 TABLET BY MOUTH 2 TIMES DAILY. 180 tablet 1  ? clonazePAM (KLONOPIN) 1 MG tablet Take 1 tablet (1 mg total) by mouth 3 (three) times daily as needed for anxiety. Hold until he requests, he will need filled earlier than expected b/c we increased dose to tid today, 04/22/2021. 90 tablet 1  ? clotrimazole-betamethasone (LOTRISONE) cream Apply 1 application topically daily. 30 g 0  ? fluvoxaMINE (LUVOX) 100 MG tablet Take 2 tablets (200 mg total) by mouth at bedtime. 60 tablet 1  ? lamoTRIgine (LAMICTAL) 150 MG tablet Take 150 mg by mouth daily.    ? Methylfol-Algae-B12-Acetylcyst (CEREFOLIN NAC) 6-90.314-2-600 MG TABS Take 1 tablet by mouth daily. 30 tablet 5  ? mometasone (ELOCON) 0.1 % cream Apply topically in the morning and at bedtime. 15 g 1  ? naproxen (NAPROSYN) 500 MG tablet Take 1 tablet (500 mg total) by mouth 2 (two) times daily with a meal. 15 tablet  0  ? psyllium (METAMUCIL) 58.6 % powder Take 1 packet by mouth in the morning and at bedtime.    ? sertraline (ZOLOFT) 100 MG tablet Take 200 mg by mouth daily.    ? ?No facility-administered medications prior to visit.  ? ?Allergies  ?Allergen Reactions  ? Garamycin [Gentamicin] Other (See Comments)  ?  Eyes itched and were edematous  ? Penicillins   ?  Pt reports is childhood allergy and does not remember the reaction  ?   ?Objective:  ? ?Today's Vitals  ? 06/11/21 0756  ?BP: 124/80  ?Pulse: 94  ?Temp: 97.8 ?F (36.6 ?C)  ?TempSrc: Temporal  ?SpO2: 96%  ?Weight: 175 lb 9.6 oz (79.7 kg)  ? ?Body mass  index is 25.93 kg/m?.  ? ?General: Well developed, well nourished. No acute distress. ?Rectum: There is a prominent anal tag and several external hemorrhoids present. I do see a  ? small fissure in the anal mucosa at the 6 o'clock position. The mucosa aroudn the anal verge is ? whitish in appearance and looks slightly macerated. ?Psych: Alert and oriented. Normal mood and affect. ? ?Health Maintenance Due  ?Topic Date Due  ? Hepatitis C Screening  Never done  ? Zoster Vaccines- Shingrix (1 of 2) Never done  ?   ?Assessment & Plan:  ? ?1. Anal fissure ?2. External hemorrhoid ?I will have [name] use docusate to reduce his need for straining. I will add some Anusol HC cream for managing the hemorrhoids. I am concerned that the anal mucosal changes might represent anal dysplasia. I will refer him to a colorectal surgeon for evaluation of this and potential anal pap smear or other evaluation. ? ?- docusate sodium (COLACE) 50 MG capsule; Take 1 capsule (50 mg total) by mouth 2 (two) times daily.  Dispense: 10 capsule; Refill: 0 ?- hydrocortisone (ANUSOL-HC) 2.5 % rectal cream; Place 1 application. rectally 2 (two) times daily.  Dispense: 30 g; Refill: 0 ?- Ambulatory referral to General Surgery ? ?Return if symptoms worsen or fail to improve.  ? ?Haydee Salter, MD ?

## 2021-06-19 ENCOUNTER — Ambulatory Visit (INDEPENDENT_AMBULATORY_CARE_PROVIDER_SITE_OTHER): Payer: Medicare Other | Admitting: Psychiatry

## 2021-06-19 DIAGNOSIS — R37 Sexual dysfunction, unspecified: Secondary | ICD-10-CM

## 2021-06-19 DIAGNOSIS — R69 Illness, unspecified: Secondary | ICD-10-CM

## 2021-06-19 DIAGNOSIS — F5105 Insomnia due to other mental disorder: Secondary | ICD-10-CM

## 2021-06-19 DIAGNOSIS — F331 Major depressive disorder, recurrent, moderate: Secondary | ICD-10-CM

## 2021-06-19 DIAGNOSIS — F4010 Social phobia, unspecified: Secondary | ICD-10-CM | POA: Diagnosis not present

## 2021-06-19 DIAGNOSIS — F341 Dysthymic disorder: Secondary | ICD-10-CM

## 2021-06-19 DIAGNOSIS — F99 Mental disorder, not otherwise specified: Secondary | ICD-10-CM

## 2021-06-19 DIAGNOSIS — F411 Generalized anxiety disorder: Secondary | ICD-10-CM

## 2021-06-19 NOTE — Progress Notes (Signed)
Psychotherapy Progress Note [facility] [name], PhD LP  Patient ID: Richard Wilson [facility] "Richard Wilson")    MRN: [id number] Therapy format: Individual psychotherapy Date: 06/19/2021      Start: 1:04p     Stop: 1:54p     Time Spent: 50 min Location: In-person   Session narrative (presenting needs, interim history, self-report of stressors and symptoms, applications of prior therapy, status changes, and interventions made in session) Scheduled short notice, making use of the 2-day notice slot.  Noted by staff to have offered that if anyone has an emergency he'd be happy to yield it.  Immediately calmer today.  Made it to [facility] [facility] in [place] church service[place] noted how the altar there was still flush with the wall, a "relic".  Feels he made a major faux pas calling the assistant priest and referring to the senior as "unapproachable".  Priest appropriately queried him, in what simply seems to be customer service feedback, but now feeling intimidated.  Resolved to get himself to go again.  Affirmed the persistence, and assessing longer, good to know better than passing feelings might suggest, and good to exercise persistence and suspended judgment.  Continued claims to better sleep, and he is walking 2/wk or more now.  Affirmed and encouraged.  GI specialist in June, in prep for colonoscopy, mainly.  Prepared to use the occasion for assertiveness practice, and practice not jumping to cynical conclusions with health care.  Therapeutic modalities: Cognitive Behavioral Therapy, Solution-Oriented/Positive Psychology, and Ego-Supportive  Mental Status/Observations:  Appearance:   Casual     Behavior:  Appropriate  Motor:  Normal  Speech/Language:   Clear and Coherent  Affect:  Appropriate  Mood:  dysthymic  Thought process:  normal  Thought content:    worry  Sensory/Perceptual disturbances:    WNL  Orientation:  Fully oriented  Attention:  Good     Concentration:  Good  Memory:  grossly intact  Insight:    Good  Judgment:   Good  Impulse Control:  Good   Risk Assessment: Danger to Self: No Self-injurious Behavior: No Danger to Others: No Physical Aggression / Violence: No Duty to Warn: No Access to Firearms a concern: No  Assessment of progress:  progressing  Diagnosis:   ICD-10-CM   1. Major depressive disorder, recurrent episode, moderate (HCC)  F33.1     2. 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 Social anxiety disorder  F40.10     4. Insomnia due to other mental disorder  F51.05    F99    improved    5. Sexual dysfunction  R37     6. Generalized anxiety disorder  F41.1     7. r/o MCI, predementia vs. nutritional, stress, and medication effects  R69      Plan:  Maintain sleep scheduling Maintain walking 2/wk or more Maintain more diversified nutrition Follow through on appropriate medical care re. hemorrhoids/colonoscopy, communicate clearly and fully Endorse trying ED medication (via urologist, most likely) Continue improved nutrition, protein early, and at least B complex supplementation if not Cerefolin Continue stabilizing bedtime and limiting staying up Continue trying out churches until find one suitable to learn more about, meet people Continue to brainstorm options for finding people with compatible interests here in [place] to prioritize physiological and circadian regulation before trying again to wean benzodiazepine Other recommendations/advice as may be noted above Continue to utilize previously learned skills ad lib Maintain medication as prescribed and work faithfully  with relevant prescriber(s) if any changes are desired or seem indicated Call the clinic on-call service, 988/hotline, 911, or present to [facility] or ER if any life-threatening psychiatric crisis Return for session(s) already scheduled. Already scheduled visit in this office 07/03/2021.  Blanchie Serve, PhD [name], PhD  LP Clinical Psychologist, [facility] [address], [place], [place] [address] [phone]

## 2021-06-25 ENCOUNTER — Encounter: Payer: Self-pay | Admitting: Family Medicine

## 2021-06-25 ENCOUNTER — Ambulatory Visit (INDEPENDENT_AMBULATORY_CARE_PROVIDER_SITE_OTHER): Payer: Medicare Other | Admitting: Family Medicine

## 2021-06-25 VITALS — BP 130/74 | HR 92 | Temp 96.7°F | Ht 69.0 in | Wt 179.0 lb

## 2021-06-25 DIAGNOSIS — K0889 Other specified disorders of teeth and supporting structures: Secondary | ICD-10-CM | POA: Diagnosis not present

## 2021-06-25 MED ORDER — IBUPROFEN 600 MG PO TABS: 600.0000 mg | ORAL_TABLET | Freq: Three times a day (TID) | ORAL | 0 refills | Status: DC | PRN

## 2021-06-25 NOTE — Progress Notes (Signed)
   Richard Wilson is a 71 y.o. male who presents today for an office visit.  Assessment/Plan:   Problems Addressed Today: Tooth pain Right lower jaw discomfort versus tooth pain Given irritation with food or drink, and not directly associated with mastication, seems like some type of nerve sensitization in the right lower jaw Trial OTC desensitizing toothpaste such as Sensodyne Trial of ibuprofen If no improvement recommend follow-up with dentistry, can consider root canal versus other assessment     Subjective:  HPI:  Patient presents with right jaw pain.  Has been ongoing for 2 weeks.  It is intermittent.  Not worsening.  On the right lower jaw, "inside cheek".  Hurts when he has food or drink in mouth.  Does not endorse pain with swallowing.  He is unsure if it is worsened by hot or cold foods.  Patient has not tried anything for pain.  ROS negative for fevers, mouth ulcers, dysphagia, rhinorrhea, chest pain, shortness of breath       Objective:  Physical Exam: BP 130/74 (BP Location: Left Arm, Patient Position: Sitting, Cuff Size: Normal)   Pulse 92   Temp (!) 96.7 F (35.9 C) (Temporal)   Ht '5\' 9"'$  (1.753 m)   Wt 179 lb (81.2 kg)   SpO2 95%   BMI 26.43 kg/m   Gen: No acute distress, resting comfortably HEENT: No palpable masses in the right cheek or cervical/periauricular lymphadenopathy, MMM, mobile tongue, no oropharyngeal lesions, history of dental caries status post dental fixation, no tenderness my bearing down on the teeth, gums do not appear inflamed, no TMJ tenderness or popping or clicking with opening and closing of jaw Neuro: Grossly normal, moves all extremities Psych: Normal affect and thought content      Caleb M. Jerline Pain, MD 06/25/2021 10:44 AM

## 2021-06-25 NOTE — Assessment & Plan Note (Signed)
Right lower jaw discomfort versus tooth pain Given irritation with food or drink, and not directly associated with mastication, seems like some type of nerve sensitization in the right lower jaw Trial OTC desensitizing toothpaste such as Sensodyne Trial of ibuprofen If no improvement recommend follow-up with dentistry, can consider root canal versus other assessment

## 2021-06-25 NOTE — Patient Instructions (Signed)
I believe your pain is related to a dental/tooth issue.  Lets try ibuprofen for 1 week, also try over-the-counter Sensodyne or sensation reducing toothpaste, if no improvement please follow-up with dentistry

## 2021-07-03 ENCOUNTER — Ambulatory Visit (INDEPENDENT_AMBULATORY_CARE_PROVIDER_SITE_OTHER): Payer: Medicare Other | Admitting: Psychiatry

## 2021-07-03 DIAGNOSIS — R69 Illness, unspecified: Secondary | ICD-10-CM

## 2021-07-03 DIAGNOSIS — F331 Major depressive disorder, recurrent, moderate: Secondary | ICD-10-CM

## 2021-07-03 DIAGNOSIS — R37 Sexual dysfunction, unspecified: Secondary | ICD-10-CM

## 2021-07-03 DIAGNOSIS — F4010 Social phobia, unspecified: Secondary | ICD-10-CM | POA: Diagnosis not present

## 2021-07-03 DIAGNOSIS — F341 Dysthymic disorder: Secondary | ICD-10-CM | POA: Diagnosis not present

## 2021-07-03 DIAGNOSIS — F99 Mental disorder, not otherwise specified: Secondary | ICD-10-CM

## 2021-07-03 DIAGNOSIS — F411 Generalized anxiety disorder: Secondary | ICD-10-CM

## 2021-07-03 DIAGNOSIS — F5105 Insomnia due to other mental disorder: Secondary | ICD-10-CM

## 2021-07-03 NOTE — Progress Notes (Signed)
Psychotherapy Progress Note [facility] Richard Moore, PhD LP  Patient ID: Richard Wilson [facility] [name])    MRN: [id number] Therapy format: Individual psychotherapy Date: 07/03/2021      Start: 4:10p     Stop: 5:00p     Time Spent: 50 min Location: In-person   Session narrative (presenting needs, interim history, self-report of stressors and symptoms, applications of prior therapy, status changes, and interventions made in session) Attended [facility] again last week, felt compelled, more or less, but clearer now no need to stay there.  Acknowledged, further encouraged in shopping churches as moved, no obligations.  Surprisingly, reveals tremors worse.  Nutritionally, is drinking V8 2/day, taking B12 1/day.  Sleep pattern is atypical, varying again and including long naps.   Frustrated all the more with erectile dysfunction and inability to orgasm.  Rec'd approach PCP about medication, check on generics and pricing, and break through rather than agonize about it.  Willing, although he is prone to complain more.  Knows he wants to fall in love.  Feels like he's "bursting".  Acknowledged no relationship just presenting itself, and he would be panicked about sexual contact anyway, agrees to try first things first satisfying himself.  Therapeutic modalities: Cognitive Behavioral Therapy and Solution-Oriented/Positive Psychology  Mental Status/Observations:  Appearance:   Casual and Neat     Behavior:  Appropriate  Motor:  Normal  Speech/Language:   Clear and Coherent  Affect:  Appropriate  Mood:  anxious and dysthymic  Thought process:  normal  Thought content:    WNL  Sensory/Perceptual disturbances:    WNL  Orientation:  Fully oriented  Attention:  Good    Concentration:  Good  Memory:  WNL  Insight:    Fair  Judgment:   Good  Impulse Control:  Good   Risk Assessment: Danger to Self: No Self-injurious Behavior: No Danger to Others: No Physical Aggression /  Violence: No Duty to Warn: No Access to Firearms a concern: No  Assessment of progress:  stabilized  Diagnosis:   ICD-10-CM   1. Major depressive disorder, recurrent episode, moderate (HCC)  F33.1     2. 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 Social anxiety disorder  F40.10     4. Generalized anxiety disorder  F41.1     5. Insomnia due to other mental disorder  F51.05    F99     6. Sexual dysfunction  R37     7. r/o MCI, predementia vs. nutritional, stress, and medication effects  R69      Plan:  Recommend addressing ED/anorgasmia with PCP, will make the call Monday.  Staff message sent in session to give a heads up.  Fully expect that some normal sexual response will go a long way to improve mood and irritability. Continue to recommend a video curfew to help with erratic sleep pattern, and indirectly with tremor Options to shop further for suitable churches.  May look into openly gay friendly congregations by searching online.  Tentative recommendation to [facility] for combination of social class and demographic, relatively high church worship service, commitment to [employer], and breadth of program in small group connections available Maintain nutritional improvements, check B12 if concern for oversupplementation.  Cerefolin is preferable given MTHFR results. Maintain sleep scheduling Maintain walking 2/wk or more Continue to brainstorm options for finding people with compatible interests here in [place] to prioritize physiological and circadian regulation before trying again to wean benzodiazepine Other recommendations/advice as may  be noted above Continue to utilize previously learned skills ad lib Maintain medication as prescribed and work faithfully with relevant prescriber(s) if any changes are desired or seem indicated Call the clinic on-call service, 988/hotline, 911, or present to [facility] or ER if any life-threatening psychiatric crisis Return in about 2  weeks (around 07/17/2021) for session(s) already scheduled. Already scheduled visit in this office 07/09/2021.  Blanchie Serve, PhD Richard Moore, PhD LP Clinical Psychologist, [facility] [address] [place], [place] [address] [phone]

## 2021-07-06 ENCOUNTER — Encounter: Payer: Self-pay | Admitting: Family Medicine

## 2021-07-06 ENCOUNTER — Ambulatory Visit (INDEPENDENT_AMBULATORY_CARE_PROVIDER_SITE_OTHER): Payer: Medicare Other | Admitting: Family Medicine

## 2021-07-06 ENCOUNTER — Other Ambulatory Visit: Payer: Self-pay

## 2021-07-06 ENCOUNTER — Inpatient Hospital Stay: Payer: Medicare Other | Attending: Hematology and Oncology | Admitting: Hematology and Oncology

## 2021-07-06 ENCOUNTER — Encounter: Payer: Self-pay | Admitting: Hematology and Oncology

## 2021-07-06 ENCOUNTER — Inpatient Hospital Stay: Payer: Medicare Other

## 2021-07-06 VITALS — BP 136/84 | HR 109 | Temp 98.2°F | Wt 179.2 lb

## 2021-07-06 DIAGNOSIS — N522 Drug-induced erectile dysfunction: Secondary | ICD-10-CM | POA: Diagnosis not present

## 2021-07-06 DIAGNOSIS — D751 Secondary polycythemia: Secondary | ICD-10-CM | POA: Insufficient documentation

## 2021-07-06 DIAGNOSIS — D45 Polycythemia vera: Secondary | ICD-10-CM

## 2021-07-06 DIAGNOSIS — F5232 Male orgasmic disorder: Secondary | ICD-10-CM

## 2021-07-06 LAB — CBC WITH DIFFERENTIAL/PLATELET
Abs Immature Granulocytes: 0.01 10*3/uL (ref 0.00–0.07)
Basophils Absolute: 0.1 10*3/uL (ref 0.0–0.1)
Basophils Relative: 1 %
Eosinophils Absolute: 0.3 10*3/uL (ref 0.0–0.5)
Eosinophils Relative: 5 %
HCT: 53.1 % — ABNORMAL HIGH (ref 39.0–52.0)
Hemoglobin: 18 g/dL — ABNORMAL HIGH (ref 13.0–17.0)
Immature Granulocytes: 0 %
Lymphocytes Relative: 21 %
Lymphs Abs: 1.3 10*3/uL (ref 0.7–4.0)
MCH: 29.9 pg (ref 26.0–34.0)
MCHC: 33.9 g/dL (ref 30.0–36.0)
MCV: 88.2 fL (ref 80.0–100.0)
Monocytes Absolute: 0.7 10*3/uL (ref 0.1–1.0)
Monocytes Relative: 11 %
Neutro Abs: 3.8 10*3/uL (ref 1.7–7.7)
Neutrophils Relative %: 62 %
Platelets: 150 10*3/uL (ref 150–400)
RBC: 6.02 MIL/uL — ABNORMAL HIGH (ref 4.22–5.81)
RDW: 13.8 % (ref 11.5–15.5)
WBC: 6.1 10*3/uL (ref 4.0–10.5)
nRBC: 0 % (ref 0.0–0.2)

## 2021-07-06 NOTE — Assessment & Plan Note (Signed)
His bone marrow biopsy is not consistent with polycythemia vera Secondary erythrocytosis from other causes such as undiagnosed sleep apnea could be a potential cause He would need phlebotomy due to elevated hemoglobin at 18 I suspect there is an element of dehydration We discussed the risk and benefits of phlebotomy and he is in agreement We will schedule a procedure this week I recommend him to continue taking 81 mg aspirin daily I will see him again in 6 months for further follow-up

## 2021-07-06 NOTE — Progress Notes (Signed)
[name] OFFICE PROGRESS NOTE  [name], [name], MD  ASSESSMENT & PLAN:  Secondary erythrocytosis His bone marrow biopsy is not consistent with polycythemia vera Secondary erythrocytosis from other causes such as undiagnosed sleep apnea could be a potential cause He would need phlebotomy due to elevated hemoglobin at 18 I suspect there is an element of dehydration We discussed the risk and benefits of phlebotomy and he is in agreement We will schedule a procedure this week I recommend him to continue taking 81 mg aspirin daily I will see him again in 6 months for further follow-up  No orders of the defined types were placed in this encounter.   The total time spent in the appointment was 20 minutes encounter with patients including review of chart and various tests results, discussions about plan of care and coordination of care plan   All questions were answered. The patient knows to call the clinic with any problems, questions or concerns. No barriers to learning was detected.    [name], MD 6/5/20231:14 PM  INTERVAL HISTORY: Richard Wilson 71 y.o. male returns for further evaluation for secondary polycythemia and history of thrombocytopenia He is not symptomatic with headaches, chest pain or cramps with elevated hemoglobin today His suspect he is not drinking enough fluids  SUMMARY OF HEMATOLOGIC HISTORY: Oncology History  Secondary erythrocytosis  04/25/2020 Bone Marrow Biopsy   BONE MARROW, ASPIRATE, CLOT, CORE:  -  Mildly hypercellular bone marrow with trilineage hematopoiesis with adequate maturation  -  See comment   PERIPHERAL BLOOD:  -  Erythrocytosis  -  Minimal thrombocytopenia  -  See CBC data   COMMENT:   The bone marrow shows trilineage hematopoiesis with a full spectrum of maturation, without significant dysplasia or increase in blasts.  There is no definitive morphologic evidence of involvement by a myeloid  neoplasm.  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  Correlation with clinical impressions, other laboratory data (eg. CBC trends), and pending cytogenetic results is recommended.  Cytogenetics are normal      I have reviewed the past medical history, past surgical history, social history and family history with the patient and they are unchanged from previous note.  ALLERGIES:  is allergic to garamycin [gentamicin] and penicillins.  MEDICATIONS:  Current Outpatient Medications  Medication Sig Dispense Refill   amitriptyline (ELAVIL) 25 MG tablet TAKE 1 TABLET BY MOUTH EVERYDAY AT BEDTIME 90 tablet 0   BioGaia Probiotic (BIOGAIA/GERBER SOOTHE) LIQD Take 5 drops by mouth daily at 8 pm. (Patient not taking: Reported on 06/25/2021)     busPIRone (BUSPAR) 30 MG tablet TAKE 1 TABLET BY MOUTH 2 TIMES DAILY. 180 tablet 1   clonazePAM (KLONOPIN) 1 MG tablet Take 1 tablet (1 mg total) by mouth 3 (three) times daily as needed for anxiety. Hold until he requests, he will need filled earlier than expected b/c we increased dose to tid today, 04/22/2021. 90 tablet 1   clotrimazole-betamethasone (LOTRISONE) cream Apply 1 application topically daily. (Patient not taking: Reported on 06/25/2021) 30 g 0   docusate sodium (COLACE) 50 MG capsule Take 1 capsule (50 mg total) by mouth 2 (two) times daily. (Patient not taking: Reported on 06/25/2021) 10 capsule 0   fluvoxaMINE (LUVOX) 100 MG tablet Take 2 tablets (200 mg  total) by mouth at bedtime. 60 tablet 1   hydrocortisone (ANUSOL-HC) 2.5 % rectal cream Place 1 application. rectally 2 (two) times daily. 30 g 0   ibuprofen (ADVIL) 600 MG tablet Take 1 tablet (600 mg total) by mouth every 8 (eight) hours as needed. 30 tablet 0   lamoTRIgine (LAMICTAL)  150 MG tablet Take 150 mg by mouth daily.     Methylfol-Algae-B12-Acetylcyst (CEREFOLIN NAC) 6-90.314-2-600 MG TABS Take 1 tablet by mouth daily. 30 tablet 5   mometasone (ELOCON) 0.1 % cream Apply topically in the morning and at bedtime. 15 g 1   naproxen (NAPROSYN) 500 MG tablet Take 1 tablet (500 mg total) by mouth 2 (two) times daily with a meal. 15 tablet 0   psyllium (METAMUCIL) 58.6 % powder Take 1 packet by mouth in the morning and at bedtime.     sertraline (ZOLOFT) 100 MG tablet Take 200 mg by mouth daily.     No current facility-administered medications for this visit.     REVIEW OF SYSTEMS:   Constitutional: Denies fevers, chills or night sweats Eyes: Denies blurriness of vision Ears, nose, mouth, throat, and face: Denies mucositis or sore throat Respiratory: Denies cough, dyspnea or wheezes Cardiovascular: Denies palpitation, chest discomfort or lower extremity swelling Gastrointestinal:  Denies nausea, heartburn or change in bowel habits Skin: Denies abnormal skin rashes Lymphatics: Denies new lymphadenopathy or easy bruising Neurological:Denies numbness, tingling or new weaknesses Behavioral/Psych: Mood is stable, no new changes  All other systems were reviewed with the patient and are negative.  PHYSICAL EXAMINATION: ECOG PERFORMANCE STATUS: 0 - Asymptomatic  Vitals:   07/06/21 1141  BP: (!) 141/93  Pulse: 86  Resp: 18  Temp: (!) 97.4 F (36.3 C)  SpO2: 96%   Filed Weights   07/06/21 1141  Weight: 177 lb 9.6 oz (80.6 kg)    GENERAL:alert, no distress and comfortable.  He looks plethoric NEURO: alert & oriented x 3 with fluent speech, no focal motor/sensory deficits  LABORATORY DATA:  I have reviewed the data as listed     Component Value Date/Time   NA 141 06/08/2021 1421   K 4.5 06/08/2021 1421   CL 104 06/08/2021 1421   CO2 28 06/08/2021 1421   GLUCOSE 97 06/08/2021 1421   BUN 15 06/08/2021 1421   CREATININE 1.47 06/08/2021 1421   CALCIUM 9.2  06/08/2021 1421   PROT 6.6 04/20/2021 1418   ALBUMIN 4.4 04/20/2021 1418   AST 19 04/20/2021 1418   ALT 17 04/20/2021 1418   ALKPHOS 123 (H) 04/20/2021 1418   BILITOT 0.6 04/20/2021 1418    No results found for: SPEP, UPEP  Lab Results  Component Value Date   WBC 6.1 07/06/2021   NEUTROABS 3.8 07/06/2021   HGB 18.0 (H) 07/06/2021   HCT 53.1 (H) 07/06/2021   MCV 88.2 07/06/2021   PLT 150 07/06/2021      Chemistry      Component Value Date/Time   NA 141 06/08/2021 1421   K 4.5 06/08/2021 1421   CL 104 06/08/2021 1421   CO2 28 06/08/2021 1421   BUN 15 06/08/2021 1421   CREATININE 1.47 06/08/2021 1421      Component Value Date/Time   CALCIUM 9.2 06/08/2021 1421   ALKPHOS 123 (H) 04/20/2021 1418   AST 19 04/20/2021 1418   ALT 17 04/20/2021 1418   BILITOT 0.6 04/20/2021 1418

## 2021-07-06 NOTE — Progress Notes (Signed)
[facility] [address] [place] [address] Dept: [phone] Dept Fax: [phone]  Office Visit  Subjective:    Patient ID: Richard Wilson, male    DOB: 1950/05/16, 71 y.o..   MRN: [id number]  Chief Complaint  Patient presents with   Erectile Dysfunction    Patient states that he is having erectile dysfunction ongoing for 5-6 years now.     History of Present Illness:  Patient is in today for a discussion of options to treat his erectile dysfunction. He has had an issue for about 5 years now with difficulty achieving an erection and ejaculation. Richard Wilson is not currently in a relationship and has never had a long-term relationship. He is homosexual and notes he did not "come out" until his earlier 64s, stating it took him a long time to recognize his sexuality. His mother was immediately accepting and his father cam around. He did engage in sex with another man once, but notes it did not go well. He currently attempts to masturbate, but finds he has trouble with arousal, achieving, and erection, and orgasm. He tried using sildenafil at some point int he past, but did not find this to help. He feels his psychiatric medications likely play a role in this. His therapist reached out to me recently Blanchie Serve, PhD) noting Richard Wilson is looking for options.  Past Medical History: Patient Active Problem List   Diagnosis Date Noted   Tooth pain 06/25/2021   Mild cognitive impairment of uncertain or unknown etiology 01/21/2021   Intention tremor    Chronic idiopathic constipation 10/23/2020   Orthostatic lightheadedness 10/23/2020   Erectile dysfunction 10/23/2020   Hyperglycemia 09/24/2020   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   Thrombocytopenia 04/08/2020   Generalized anxiety disorder 01/01/2020   Major depressive disorder 01/01/2020   Primary osteoarthritis of both hips 07/30/2019   Chronic  kidney disease, stage 3a 03/19/2019   Hyperlipidemia 12/15/2018   Arthritis of knee 08/31/2017   Nephrolithiasis 06/22/2016   Obstructive sleep apnea 04/13/2015   Diverticulosis of large intestine without hemorrhage 09/30/2013   Mild mitral regurgitation 28/31/5176   Diastolic dysfunction 16/08/3708   Elevated PSA 12/22/2012   RBBB (right bundle branch block) 12/03/2012   Past Surgical History:  Procedure Laterality Date   APPENDECTOMY     FOOT TENDON SURGERY Right    TONSILLECTOMY     Family History  Problem Relation Age of Onset   Cancer Mother        Ovarian   Heart disease Mother    Memory loss Mother        with advanced age   Prostate cancer Father    Throat cancer Maternal Grandfather    Heart attack Paternal Grandfather    Cancer Maternal Aunt        Breast, metastatic   Outpatient Medications Prior to Visit  Medication Sig Dispense Refill   amitriptyline (ELAVIL) 25 MG tablet TAKE 1 TABLET BY MOUTH EVERYDAY AT BEDTIME 90 tablet 0   BioGaia Probiotic (BIOGAIA/GERBER SOOTHE) LIQD Take 5 drops by mouth daily at 8 pm.     busPIRone (BUSPAR) 30 MG tablet TAKE 1 TABLET BY MOUTH 2 TIMES DAILY. 180 tablet 1   clonazePAM (KLONOPIN) 1 MG tablet Take 1 tablet (1 mg total) by mouth 3 (three) times daily as needed for anxiety. Hold until he requests, he will need filled earlier than expected b/c we increased dose to tid today, 04/22/2021. 90  tablet 1   fluvoxaMINE (LUVOX) 100 MG tablet Take 2 tablets (200 mg total) by mouth at bedtime. 60 tablet 1   ibuprofen (ADVIL) 600 MG tablet Take 1 tablet (600 mg total) by mouth every 8 (eight) hours as needed. 30 tablet 0   lamoTRIgine (LAMICTAL) 150 MG tablet Take 150 mg by mouth daily.     Methylfol-Algae-B12-Acetylcyst (CEREFOLIN NAC) 6-90.314-2-600 MG TABS Take 1 tablet by mouth daily. 30 tablet 5   naproxen (NAPROSYN) 500 MG tablet Take 1 tablet (500 mg total) by mouth 2 (two) times daily with a meal. 15 tablet 0   psyllium (METAMUCIL)  58.6 % powder Take 1 packet by mouth in the morning and at bedtime.     sertraline (ZOLOFT) 100 MG tablet Take 200 mg by mouth daily.     clotrimazole-betamethasone (LOTRISONE) cream Apply 1 application topically daily. (Patient not taking: Reported on 06/25/2021) 30 g 0   docusate sodium (COLACE) 50 MG capsule Take 1 capsule (50 mg total) by mouth 2 (two) times daily. (Patient not taking: Reported on 06/25/2021) 10 capsule 0   hydrocortisone (ANUSOL-HC) 2.5 % rectal cream Place 1 application. rectally 2 (two) times daily. (Patient not taking: Reported on 07/06/2021) 30 g 0   mometasone (ELOCON) 0.1 % cream Apply topically in the morning and at bedtime. (Patient not taking: Reported on 07/06/2021) 15 g 1   No facility-administered medications prior to visit.   Allergies  Allergen Reactions   Garamycin [Gentamicin] Other (See Comments)    Eyes itched and were edematous   Penicillins     Pt reports is childhood allergy and does not remember the reaction   Objective:   Today's Vitals   07/06/21 1344  BP: 136/84  Pulse: (!) 109  Temp: 98.2 F (36.8 C)  TempSrc: Temporal  SpO2: 95%  Weight: 179 lb 3.2 oz (81.3 kg)   Body mass index is 26.46 kg/m.   General: Well developed, well nourished. No acute distress. Psych: Alert and oriented. Normal mood and affect.  Health Maintenance Due  Topic Date Due   Hepatitis C Screening  Never done   Zoster Vaccines- Shingrix (1 of 2) Never done     Assessment & Plan:   1. Drug-induced erectile dysfunction 2. Anorgasmia of male [name] issues are not clearly an organic issue with ED. I suspect his psychiatric medications do impact this. However, I also wonder about other psychological factors that may be playing a role in his lack of arousal and trouble with orgasm. I offered a trial of Cialis, but he was concerned for the cost of this. As an alternative, I will have him see a urologist to discuss other potential options, such as penile  injections to help manage this.  - Ambulatory referral to Urology   Return for As scheduled.   Haydee Salter, MD

## 2021-07-09 ENCOUNTER — Ambulatory Visit: Payer: Medicare Other | Admitting: Physician Assistant

## 2021-07-09 ENCOUNTER — Inpatient Hospital Stay: Payer: Medicare Other

## 2021-07-09 ENCOUNTER — Encounter: Payer: Self-pay | Admitting: Physician Assistant

## 2021-07-09 ENCOUNTER — Other Ambulatory Visit: Payer: Self-pay

## 2021-07-09 ENCOUNTER — Ambulatory Visit (INDEPENDENT_AMBULATORY_CARE_PROVIDER_SITE_OTHER): Payer: Medicare Other | Admitting: Physician Assistant

## 2021-07-09 DIAGNOSIS — F419 Anxiety disorder, unspecified: Secondary | ICD-10-CM | POA: Diagnosis not present

## 2021-07-09 DIAGNOSIS — F3341 Major depressive disorder, recurrent, in partial remission: Secondary | ICD-10-CM | POA: Diagnosis not present

## 2021-07-09 DIAGNOSIS — F99 Mental disorder, not otherwise specified: Secondary | ICD-10-CM

## 2021-07-09 DIAGNOSIS — R251 Tremor, unspecified: Secondary | ICD-10-CM

## 2021-07-09 DIAGNOSIS — F5105 Insomnia due to other mental disorder: Secondary | ICD-10-CM | POA: Diagnosis not present

## 2021-07-09 DIAGNOSIS — D751 Secondary polycythemia: Secondary | ICD-10-CM | POA: Diagnosis not present

## 2021-07-09 DIAGNOSIS — R37 Sexual dysfunction, unspecified: Secondary | ICD-10-CM | POA: Diagnosis not present

## 2021-07-09 MED ORDER — FLUVOXAMINE MALEATE 100 MG PO TABS: 200.0000 mg | ORAL_TABLET | Freq: Every day | ORAL | 1 refills | Status: DC

## 2021-07-09 MED ORDER — CLONAZEPAM 1 MG PO TABS: 1.0000 mg | ORAL_TABLET | Freq: Three times a day (TID) | ORAL | 1 refills | Status: DC | PRN

## 2021-07-09 NOTE — Progress Notes (Signed)
Patient tolerated phlebotomy well. Remianed for 30 minute post observation. Reviewed DC instructions with patient prior to DC.

## 2021-07-09 NOTE — Patient Instructions (Signed)
Wean off Zoloft 100 mg, take 1 1/2 pills daily for 4 days, then 1 pill daily for 4 days, then 1/2 pill daily for 4 days, then stop.

## 2021-07-09 NOTE — Progress Notes (Signed)
[facility]  Patient ID: Richard Wilson,  MRN: [id number]  PCP: Haydee Salter, MD  Date of Evaluation: 07/09/2021 Tme spent:30 minutes  Chief Complaint:  Chief Complaint   Anxiety; Depression; Insomnia; Follow-up    HISTORY/CURRENT STATUS: HPI For routine med check.  Lamictal was decreased at [place]. Mood is about the same.  He does not feel like any medications need to be changed.  He reads and goes to 'junk stores' for fun.  Does not cry easily.  Energy and motivation are good.  ADLs and personal hygiene are normal.  Appetite and weight are stable.  He is sleeping much better since we started amitriptyline.  No suicidal or homicidal thoughts.  Continues to have generalized anxiety, like something bad may happen at any time if he does not take the Klonopin.  But when taking it he feels okay.  Not having panic attacks.  Still has thoughts on occasion that someone else is in his apartment, like maybe someone walked by him but he logically knows that is not true.  Those thoughts have decreased since the last visit.  He was not able to bring his medications in today, but thinks he is on the Zoloft and Luvox.  There has been confusion in the past about this.  He will double check when he gets home.  Patient denies increased energy with decreased need for sleep, no increased talkativeness, no racing thoughts, no impulsivity or risky behaviors, no increased spending, no increased libido, no grandiosity, no increased irritability or anger, and no hallucinations.  Continues to have a tremor in hands bilaterally, occurs when he reaches for something or is holding something like a glass.  He has never dropped anything but states what ever drinks he has can sometimes locks around.  It also affects his eating, has not spilled food on himself but does get shaky.  He has seen neuro in the past and has an appointment coming up with Sherrilyn Rist PA-C, neurology, in the next few weeks for  reevaluation.  Denies dizziness, syncope, seizures, numbness, tingling, tics, unsteady gait, slurred speech, confusion. Denies muscle or joint pain, stiffness, or dystonia.  Individual Medical History/ Review of Systems: Changes? :No     Past medications for mental health diagnoses include: Rozerem, Trazodone caused vivid dreams, Xanax, Buspar, mirtazapine caused excessive somnolence and fatigue with flulike symptoms, Belsomra, propranolol for tremor, Zoloft, Abilify caused tremor, Klonopin, Effexor, Ativan, Wellbutrin, Nardil, gabapentin, Librium   Pertinent info after reviewing Dr. Evelena Leyden notes, his previous psychiatrist in [facility]. Past suicide attempt in September 1996, by taking Tylenol.  He was admitted at that time to a psychiatric hospital in [facility].  Admitted to psych hospital multiple times in the past for "nervous breakdowns."  January 1985, February 1986, October 1991, September 1996, July 2000.  Allergies: Garamycin [gentamicin] and Penicillins  Current Medications:  Current Outpatient Medications:    amitriptyline (ELAVIL) 25 MG tablet, TAKE 1 TABLET BY MOUTH EVERYDAY AT BEDTIME, Disp: 90 tablet, Rfl: 0   BioGaia Probiotic (BIOGAIA/GERBER SOOTHE) LIQD, Take 5 drops by mouth daily at 8 pm., Disp: , Rfl:    busPIRone (BUSPAR) 30 MG tablet, TAKE 1 TABLET BY MOUTH 2 TIMES DAILY., Disp: 180 tablet, Rfl: 1   ibuprofen (ADVIL) 600 MG tablet, Take 1 tablet (600 mg total) by mouth every 8 (eight) hours as needed., Disp: 30 tablet, Rfl: 0   lamoTRIgine (LAMICTAL) 150 MG tablet, Take 150 mg by mouth daily., Disp: ,  Rfl:    Methylfol-Algae-B12-Acetylcyst (CEREFOLIN NAC) 6-90.314-2-600 MG TABS, Take 1 tablet by mouth daily., Disp: 30 tablet, Rfl: 5   naproxen (NAPROSYN) 500 MG tablet, Take 1 tablet (500 mg total) by mouth 2 (two) times daily with a meal., Disp: 15 tablet, Rfl: 0   psyllium (METAMUCIL) 58.6 % powder, Take 1 packet by mouth in the  morning and at bedtime., Disp: , Rfl:    clonazePAM (KLONOPIN) 1 MG tablet, Take 1 tablet (1 mg total) by mouth 3 (three) times daily as needed for anxiety. Hold until he requests, he will need filled earlier than expected b/c we increased dose to tid today, 04/22/2021., Disp: 90 tablet, Rfl: 1   clotrimazole-betamethasone (LOTRISONE) cream, Apply 1 application topically daily. (Patient not taking: Reported on 06/25/2021), Disp: 30 g, Rfl: 0   docusate sodium (COLACE) 50 MG capsule, Take 1 capsule (50 mg total) by mouth 2 (two) times daily. (Patient not taking: Reported on 06/25/2021), Disp: 10 capsule, Rfl: 0   fluvoxaMINE (LUVOX) 100 MG tablet, Take 2 tablets (200 mg total) by mouth at bedtime., Disp: 60 tablet, Rfl: 1   hydrocortisone (ANUSOL-HC) 2.5 % rectal cream, Place 1 application. rectally 2 (two) times daily. (Patient not taking: Reported on 07/06/2021), Disp: 30 g, Rfl: 0   mometasone (ELOCON) 0.1 % cream, Apply topically in the morning and at bedtime. (Patient not taking: Reported on 07/06/2021), Disp: 15 g, Rfl: 1 Medication Side Effects: sexual dysfunction will be seeing his primary care provider for this.  Family Medical/ Social History: Changes?  no  MENTAL HEALTH EXAM:  There were no vitals taken for this visit.There is no height or weight on file to calculate BMI.  General Appearance: Casual and Well Groomed  Eye Contact:  Good  Speech:  Clear and Coherent and Normal Rate  Volume:  Normal  Mood:  Euthymic  Affect:  Congruent  Thought Process:  Goal Directed and Descriptions of Associations: Circumstantial  Orientation:  Full (Time, Place, and Person)  Thought Content: Logical   Suicidal Thoughts:  No  Homicidal Thoughts:  No  Memory:  WNL  Judgement:  Good  Insight:  Good  Psychomotor Activity:  Normal and no tremor present at this time.  Concentration:  Concentration: Good and Attention Span: Good  Recall:  Good  Fund of Knowledge: Good  Language: Good  Assets:  Desire  for Improvement  ADL's:  Intact  Cognition: WNL  Prognosis:  Good   Gene site test results are on chart under media. See neuro psych test results on chart..  Saw [name] 01/21/2020  DIAGNOSES:    ICD-10-CM   1. Recurrent major depression in partial remission (HCC)  F33.41     2. Insomnia due to other mental disorder  F51.05    F99     3. Severe anxiety  F41.9     4. Sexual dysfunction  R37     5. Tremor  R25.1       Receiving Psychotherapy: Yes    Dr. Jonni Sanger Mitchum.    RECOMMENDATIONS:  PDMP was reviewed.  Klonopin last filled 06/17/2021. I provided 30 minutes of face to face time during this encounter, including time spent before and after the visit in records review, medical decision making, counseling pertinent to today's visit, and charting.  Again discussed the fact that he is not supposed to be on Zoloft and Luvox at the same time.  He will double check this when he gets home and if so he will  wean off the Zoloft by taking 150 mg daily for 4 days, 100 mg daily for 4 days, 50 mg daily for 4 days and then stop.  Continue Luvox at the same time.  Instructions are on the AVS and he is aware of this. Overall he is doing well so no changes will be made.  Continue amitriptyline 25 mg, 1 p.o. nightly. Continue BuSpar 30 mg, 1 p.o. twice daily. Continue Klonopin 1 mg, 1 p.o. 3 times daily as needed. Continue Luvox 100 mg, 2 p.o. nightly. Continue Lamictal 150 mg, 1 p.o. nightly. Continue Cerafolin NAC, 1 p.o. daily. Continue counseling with Dr. [name]. Seeing neurology later this month. Return in 2 months.  Donnal Moat, PA-C

## 2021-07-09 NOTE — Patient Instructions (Signed)

## 2021-07-10 ENCOUNTER — Ambulatory Visit (INDEPENDENT_AMBULATORY_CARE_PROVIDER_SITE_OTHER): Payer: Medicare Other | Admitting: Psychiatry

## 2021-07-10 DIAGNOSIS — F4010 Social phobia, unspecified: Secondary | ICD-10-CM | POA: Diagnosis not present

## 2021-07-10 DIAGNOSIS — F341 Dysthymic disorder: Secondary | ICD-10-CM

## 2021-07-10 DIAGNOSIS — F331 Major depressive disorder, recurrent, moderate: Secondary | ICD-10-CM | POA: Diagnosis not present

## 2021-07-10 DIAGNOSIS — F411 Generalized anxiety disorder: Secondary | ICD-10-CM

## 2021-07-10 DIAGNOSIS — R37 Sexual dysfunction, unspecified: Secondary | ICD-10-CM | POA: Diagnosis not present

## 2021-07-10 DIAGNOSIS — R69 Illness, unspecified: Secondary | ICD-10-CM

## 2021-07-10 DIAGNOSIS — Z1589 Genetic susceptibility to other disease: Secondary | ICD-10-CM

## 2021-07-10 DIAGNOSIS — Z79899 Other long term (current) drug therapy: Secondary | ICD-10-CM

## 2021-07-10 NOTE — Progress Notes (Signed)
Psychotherapy Progress Note [facility] Richard Moore, PhD LP  Patient ID: Richard Wilson [facility] [name])    MRN: [id number] Therapy format: Individual psychotherapy Date: 07/10/2021      Start: 82:95A2     Stop: 12:05p     Time Spent: 50 min Location: In-person   Session narrative (presenting needs, interim history, self-report of stressors and symptoms, applications of prior therapy, status changes, and interventions made in session) Got referral to urology for Monday but "loathe to go", pessimistic it will be of any help.  Reminded he can't fairly prejudge it, and he was considering trying to get more expensively involved in sex therapy precisely because he felt it urgent to get sexual response back.  Re. church, feels all his choices are inadequate now within the [place], in [place].  [employer] in [place]) is appealingly high church but some practices he may differ with, plus anxiety with the lead priest, whom he sees as aloof.  Again reminded he can choose fairly based on what he likes, wants, and continue to shop until satisfied.  Did write notes to his former priest and altar guild chair, letting himself sleep on them and edit, after visiting [facility] again, and feeling again that he would be roped into taking on responsibility if he returned, reversing himself again.  Validated that if he changes his mind, he changes his mind, it's responsible to inform if he understands anyone to be counting on something, otherwise, OK to move on.  Therapeutic modalities: Cognitive Behavioral Therapy and Solution-Oriented/Positive Psychology  Mental Status/Observations:  Appearance:   Neat     Behavior:  Appropriate  Motor:  Normal  Speech/Language:   Clear and Coherent  Affect:  Appropriate  Mood:  anxious and dysthymic  Thought process:  normal  Thought content:    worry  Sensory/Perceptual disturbances:    WNL  Orientation:  Fully oriented   Attention:  Good    Concentration:  Good  Memory:  grossly intact  Insight:    Fair  Judgment:   Good  Impulse Control:  Fair   Risk Assessment: Danger to Self: No Self-injurious Behavior: No Danger to Others: No Physical Aggression / Violence: No Duty to Warn: No Access to Firearms a concern: No  Assessment of progress:  stabilized  Diagnosis:   ICD-10-CM   1. Major depressive disorder, recurrent episode, moderate (HCC)  F33.1     2. Sexual dysfunction  R37     3. Social anxiety disorder  F40.10     4. Generalized anxiety disorder  F41.1     5. 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     6. r/o MCI, predementia vs. nutritional, stress, and medication effects  R69     7. Long-term current use of benzodiazepine  Z79.899     8. Heterozygous MTHFR mutation C677T  Z15.89      Plan:  Follow through with urologist/ED Rx referral Continue to recommend a video curfew to help with erratic sleep pattern, and indirectly with tremor Options to shop further for suitable churches.  May look into openly gay friendly congregations by searching online.  Tentative recommendation to [facility] for combination of social class and demographic, relatively high church worship service, commitment to [employer], and breadth of program in small group connections available Maintain nutritional improvements, check B12 if concern for oversupplementation.  Cerefolin is preferable given MTHFR results. Maintain sleep scheduling Still recommend walking Continue to brainstorm options for finding people with compatible  interests here in [place] to prioritize physiological and circadian regulation before trying again to wean benzodiazepine Other recommendations/advice as may be noted above Continue to utilize previously learned skills ad lib Maintain medication as prescribed and work faithfully with relevant prescriber(s) if any changes are desired or seem indicated Call the clinic on-call  service, 988/hotline, 911, or present to [facility] or ER if any life-threatening psychiatric crisis Return for session(s) already scheduled. Already scheduled visit in this office 07/17/2021.  Blanchie Serve, PhD Richard Moore, PhD LP Clinical Psychologist, [facility] [address] [place], [place] [address] [phone]

## 2021-07-17 ENCOUNTER — Ambulatory Visit (INDEPENDENT_AMBULATORY_CARE_PROVIDER_SITE_OTHER): Payer: Medicare Other | Admitting: Psychiatry

## 2021-07-17 DIAGNOSIS — R37 Sexual dysfunction, unspecified: Secondary | ICD-10-CM

## 2021-07-17 DIAGNOSIS — R69 Illness, unspecified: Secondary | ICD-10-CM

## 2021-07-17 DIAGNOSIS — F411 Generalized anxiety disorder: Secondary | ICD-10-CM

## 2021-07-17 DIAGNOSIS — F341 Dysthymic disorder: Secondary | ICD-10-CM

## 2021-07-17 DIAGNOSIS — F331 Major depressive disorder, recurrent, moderate: Secondary | ICD-10-CM

## 2021-07-17 DIAGNOSIS — F4010 Social phobia, unspecified: Secondary | ICD-10-CM

## 2021-07-17 DIAGNOSIS — Z1589 Genetic susceptibility to other disease: Secondary | ICD-10-CM

## 2021-07-17 DIAGNOSIS — Z79899 Other long term (current) drug therapy: Secondary | ICD-10-CM

## 2021-07-17 NOTE — Progress Notes (Signed)
Psychotherapy Progress Note [facility] [name], PhD LP  Patient ID: Richard Wilson [facility] [name])    MRN: [id number] Therapy format: Individual psychotherapy Date: 07/17/2021      Start: 2:10p     Stop: 3:00p     Time Spent: 50 min Location: In-person   Session narrative (presenting needs, interim history, self-report of stressors and symptoms, applications of prior therapy, status changes, and interventions made in session) More seclusive of late, still erratic sleep schedule and indulging 2 naps a day now.  Did get to the urologist, who did prescribe sildenafil, which did work physically, but after twice, felt too ashamed and put them away.  Shame came a little bit from being gay, more from self-pleasuring, feeling he's supposed to be more mature, feeling he's supposed to be over wanting sexual response, and just pathetic to be alone.  Also the forlornness of feeling how his parents failed to show him the facts of life.  Supportively confronted self-shaming, revalidated OK to wan sexual response and OK to be self-pleasuring, as long as his porn use itself isn't spoiling sleep and self-esteem, which it seems to do.  Able to cry this week for that, and for mulling over sense of failure in life, with a master's degree not put to use [employer] -- grad [employer] before they were accredited, plus he felt the message to him from the job market was unworthy.  Reviewed and challenged failure and shame thoughts about having volunteered for [employer], and for begging off church this week.  Tremor worse, will see neurologist this week.  Severe pain in the meat of his thumb, with radiating pins and needles, plus dominant hand tremor.  Sounds like arthritis in thumb joint.  Will have brief stabs of pain in right trapezius, suggesting a motor nerve pinch.    Cousin's Parkinson's and nursing home status seem to intimidate him that maybe he could be developing it, too.  Made appeal  to cousin's H offering to comfort him if he is overwhelmed with her situation.  Affirmed offering comfort.  Therapeutic modalities: Cognitive Behavioral Therapy, Solution-Oriented/Positive Psychology, and Ego-Supportive  Mental Status/Observations:  Appearance:   Casual and Neat     Behavior:  Appropriate  Motor:  Normal  Speech/Language:   Clear and Coherent  Affect:  Appropriate  Mood:  anxious and dysthymic  Thought process:  normal  Thought content:    WNL  Sensory/Perceptual disturbances:    WNL  Orientation:  Fully oriented  Attention:  Good    Concentration:  Fair  Memory:  grossly intact  Insight:    Fair  Judgment:   Good  Impulse Control:  Fair   Risk Assessment: Danger to Self: No Self-injurious Behavior: No Danger to Others: No Physical Aggression / Violence: No Duty to Warn: No Access to Firearms a concern: No  Assessment of progress:  stabilized  Diagnosis:   ICD-10-CM   1. Major depressive disorder, recurrent episode, moderate (HCC)  F33.1     2. 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 Social anxiety disorder  F40.10     4. Sexual dysfunction  R37     5. Generalized anxiety disorder  F41.1     6. r/o MCI, predementia vs. nutritional, stress, and medication effects  R69     7. Heterozygous MTHFR mutation C677T  Z15.89     8. Long-term current use of benzodiazepine  Z79.899      Plan:  See neurologist  as planned, forecast possible nerve impingement syndrome in C-spine tying trapezius pain and hand tremor together.   Try antiinflammatory topical for strongly suspected thumb arthritis Worth trying Viagra again, but defer to conscience Continue to recommend a video curfew to help with erratic sleep pattern, and indirectly with tremor Church of choice this week, go anyway.  Continue recommendation to see if [facility] [employer] would be a good fit. Options to shop further for suitable churches.  May look into openly gay friendly congregations by searching online.   Tentative recommendation to [facility] for combination of social class and demographic, relatively high church worship service, commitment to [employer], and breadth of program in small group connections available Maintain nutritional improvements, check B12 if concern for oversupplementation.  Cerefolin is preferable given MTHFR results. Maintain sleep scheduling Still recommend walking Continue to brainstorm options for finding people with compatible interests here in [place] to prioritize physiological and circadian regulation before trying again to wean benzodiazepine Other recommendations/advice as may be noted above Continue to utilize previously learned skills ad lib Maintain medication as prescribed and work faithfully with relevant prescriber(s) if any changes are desired or seem indicated Call the clinic on-call service, 988/hotline, 911, or present to [facility] or ER if any life-threatening psychiatric crisis Return for session(s) already scheduled. Already scheduled visit in this office 07/23/2021.  Blanchie Serve, PhD [name], PhD LP Clinical Psychologist, [facility] [address] [place], [place] [address] [phone]

## 2021-07-21 ENCOUNTER — Encounter: Payer: Self-pay | Admitting: Physician Assistant

## 2021-07-21 ENCOUNTER — Ambulatory Visit (INDEPENDENT_AMBULATORY_CARE_PROVIDER_SITE_OTHER): Payer: Medicare Other | Admitting: Physician Assistant

## 2021-07-21 VITALS — BP 128/84 | HR 95 | Ht 69.0 in | Wt 182.0 lb

## 2021-07-21 DIAGNOSIS — G3184 Mild cognitive impairment, so stated: Secondary | ICD-10-CM

## 2021-07-21 DIAGNOSIS — G251 Drug-induced tremor: Secondary | ICD-10-CM | POA: Diagnosis not present

## 2021-07-21 NOTE — Patient Instructions (Signed)
It was a pleasure to see you today at our office.   Recommendations:  Follow up as needed Consider reevaluation for sleep apnea  Aspirin 81 mg daily Drink more water  Please discuss with pyschotherapist reducing Klonopin    Whom to call:  Memory  decline, memory medications: Call our office [phone]     Counseling regarding caregiver distress, including caregiver depression, anxiety and issues regarding community resources, adult day care programs, adult living facilities, or memory care questions:   Feel free to contact [place] [place], Social Worker at [phone]   For assessment of decision of mental capacity and competency:  Call Dr. Anthoney Harada, geriatric psychiatrist at [phone]  For guidance in geriatric dementia issues please call [facility] [phone]     If you have any severe symptoms of a stroke, or other severe issues such as confusion,severe chills or fever, etc call 911 or go to the ER as you may need to be evaluated further        RECOMMENDATIONS FOR ALL PATIENTS WITH MEMORY PROBLEMS: 1. Continue to exercise (Recommend 30 minutes of walking everyday, or 3 hours every week) 2. Increase social interactions - continue going to [place] and enjoy social gatherings with friends and family 3. Eat healthy, avoid fried foods and eat more fruits and vegetables 4. Maintain adequate blood pressure, blood sugar, and blood cholesterol level. Reducing the risk of stroke and cardiovascular disease also helps promoting better memory. 5. Avoid stressful situations. Live a simple life and avoid aggravations. Organize your time and prepare for the next day in anticipation. 6. Sleep well, avoid any interruptions of sleep and avoid any distractions in the bedroom that may interfere with adequate sleep quality 7. Avoid sugar, avoid sweets as there is a strong link between excessive sugar intake, diabetes, and cognitive impairment We discussed the  Mediterranean diet, which has been shown to help patients reduce the risk of progressive memory disorders and reduces cardiovascular risk. This includes eating fish, eat fruits and green leafy vegetables, nuts like almonds and hazelnuts, walnuts, and also use olive oil. Avoid fast foods and fried foods as much as possible. Avoid sweets and sugar as sugar use has been linked to worsening of memory function.  There is always a concern of gradual progression of memory problems. If this is the case, then we may need to adjust level of care according to patient needs. Support, both to the patient and caregiver, should then be put into place.    FALL PRECAUTIONS: Be cautious when walking. Scan the area for obstacles that may increase the risk of trips and falls. When getting up in the mornings, sit up at the edge of the bed for a few minutes before getting out of bed. Consider elevating the bed at the head end to avoid drop of blood pressure when getting up. Walk always in a well-lit room (use night lights in the walls). Avoid area rugs or power cords from appliances in the middle of the walkways. Use a walker or a cane if necessary and consider physical therapy for balance exercise. Get your eyesight checked regularly.  FINANCIAL OVERSIGHT: Supervision, especially oversight when making financial decisions or transactions is also recommended.  HOME SAFETY: Consider the safety of the kitchen when operating appliances like stoves, microwave oven, and blender. Consider having supervision and share cooking responsibilities until no longer able to participate in those. Accidents with firearms and other hazards in the house should be identified and addressed as well.  ABILITY TO BE LEFT ALONE: If patient is unable to contact 911 operator, consider using LifeLine, or when the need is there, arrange for someone to stay with patients. Smoking is a fire hazard, consider supervision or cessation. Risk of wandering  should be assessed by caregiver and if detected at any point, supervision and safe proof recommendations should be instituted.  MEDICATION SUPERVISION: Inability to self-administer medication needs to be constantly addressed. Implement a mechanism to ensure safe administration of the medications.   DRIVING: Regarding driving, in patients with progressive memory problems, driving will be impaired. We advise to have someone else do the driving if trouble finding directions or if minor accidents are reported. Independent driving assessment is available to determine safety of driving.   If you are interested in the driving assessment, you can contact the following:  [employer] in [place]  [place] [phone]  [place]  [facility] [phone] or [phone]

## 2021-07-21 NOTE — Progress Notes (Incomplete)
Assessment/Plan:   Mild Cognitive Impairment, likelydue to meds and anxiety    Recommendations:   Continue donepezil 10 mg daily Side effects were discussed Follow up in   months.   1.  Parkinsonism, neuroleptic induced             -Resolved off of Abilify.  Reassurance provided   2.  Tremor             -Patient complains of tremor, but I do not really see any tremor like I did in the past.  Per PCP hx, pt had wondered if it was because his clonazepam was stopped after a 5-week taper (he didn't relay that to me).  I really do not see any time when he did not pick up his clonazepam via PDMP.  I worry that he is getting habituation from high-dose clonazepam and gets tremor as it wears off.  We discussed that today.             -He does have some entrainment with tremor in the right hand.  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             -We will schedule neurocognitive testing.  Case discussed with [name]  who agrees with the plan     Subjective:    Richard Wilson is a very pleasant 71 y.o. RH male  seen today in follow up for memory loss. This patient is accompanied in the office by his who supplements the history.  Previous records as well as any outside records available were reviewed prior to todays visit.  Patient was last seen at our office on  at which time his  Patient is currently on   Any changes in memory since last visit? About the same  Patient lives with: Patient lives alone repeats oneself?  Denies  Disoriented when walking into a room?  Patient denies   Leaving objects in unusual places?  Patient denies   Ambulates  with difficulty?   Patient denies   Recent falls?  Patient denies   Any head injuries?  Patient denies   History of  seizures?   Patient denies   Wandering behavior?  Patient denies   Patient drives?   Patient no longer drives  Patient drives with assistance  Patient uses [employer] to drive   Any mood changes such irritability agitation?  Patient denies   Any history of depression?:  Patient denies   Hallucinations?  Patient denies   Paranoia?  Patient denies   Patient reports that he sleeps well without vivid dreams, REM behavior or sleepwalking   Patient reports vivid dreams   More seclusive of late, still erratic sleep schedule and indulging 2 naps a day now History of sleep apnea?  Patient denies   Any hygiene concerns?  Patient denies   Independent of bathing and dressing?  Endorsed  Does the patient needs help with medications?  pillbox pill pack  Patient denies   Who is in charge of the finances?  Patient is in charge   is in charge   assists the patient  and denies missing any bills   occasionally misses  a payment. Any changes in appetite?  Patient denies   Patient have trouble swallowing? Patient denies   Does the patient cook?  Patient denies   Any kitchen accidents such as leaving the stove on? Patient denies   Any headaches?  Patient denies   The double vision? Patient denies   Any focal numbness or tingling?  Patient denies   Chronic back pain Patient denies   Unilateral weakness?  Patient denies   Any tremors?  Patient denies   Gleason Ardoin was seen today in follow up for what was felt to be neuroleptic induced parkinsonism (Abilify).  My previous records were reviewed prior to todays visit as well as outside records available to me.  Patient currently off of Abilify.  Pharmacy indicates that he last picked this up for a 90-day supply in May.  Last seen in our office about 1 year ago.  Patient saw primary care on November 8 and complained about tremor, but primary care thought that it was related to his "multiple psychoactive medications."  Patient thought that it was due to the fact that one of  his behavioral health clinician stopped his clonazepam, after a 5-week taper.  However, because of his complaints, it was restarted.  Patient is on a fairly large dose of 1 mg 3 times per day.  PDMP is reviewed.  I do not see any time that he did not pick up his clonazepam monthly between February and current date (last picked up November 14).  Pt states that when he picks up a pitcher of tea or tries to pour something, he will have tremor, mostly in the R hand.  Also c/o word finding trouble or mixes up one word for another.   Any history of anosmia?  Patient denies   Any incontinence of urine?  Patient denies   Any bowel dysfunction?   Patient denies     Constipation     diarrhea  R MRI brain wo contrast 02/09/21 No evidence of acute intracranial abnormality.Minimal generalized parenchymal volume loss   Neuropsychological evaluation 01/21/21  Briefly, results suggested a few somewhat isolated and non-specific impairments. These included cognitive flexibility, performance on a simulated bill paying task, encoding (i.e., learning) aspects of verbal memory, and delayed retrieval/recognition across a list-based memory task. An additional weakness was exhibited across processing speed. While a normative weakness was exhibited across a receptive language task, he only missed a few items overall, making this less likely to be a current functional impairment. Performances were adequate when compared to age-matched peers across domains of attention/concentration, abstract reasoning, safety/judgment, expressive language, visuospatial abilities, and both story and figure-based retrieval/recognition aspects of memory. The etiology for ongoing dysfunction is unclear. There certainly remains the potential that weaknesses are due to a combination of medication side effects, moderate to severe psychiatric distress, and acute testing anxiety. Despite prior efforts to discontinue Klonopin/clonazepam, he continues to be on a  fairly large dose ('1mg'$ , three times per day). Cognitive side effects are well established with this medication. Additionally, acute levels of severe anxiety and moderate depression would also negatively impact cognitive abilities, especially across processing speed, cognitive flexibility, and memory. This combined etiology should remain on his differential.   Labs 04/20/21  B12 308, Folate low 4.2, TSH nl 0.85, Hb/Hct 18/53.1  Past Medical History:  Diagnosis Date   Arthritis of knee 08/31/2017   Chronic idiopathic constipation 10/23/2020   Chronic kidney disease, stage 3a 53/66/4403   Diastolic dysfunction 47/42/5956   Diverticulosis of  large intestine without hemorrhage 09/30/2013   Elevated PSA 12/22/2012   Erectile dysfunction 10/23/2020   Generalized anxiety disorder 01/01/2020   Hyperglycemia 09/24/2020   Intention tremor    Major depressive disorder 01/01/2020   Mild cognitive impairment of uncertain or unknown etiology 01/21/2021   Mild mitral regurgitation 08/15/2013   Nephrolithiasis 06/22/2016   Obstructive sleep apnea 04/13/2015   Orthostatic lightheadedness 10/23/2020   Primary osteoarthritis of both hips 07/30/2019   Pure hypercholesterolemia 12/15/2018   RBBB (right bundle branch block) 12/03/2012   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   Thrombocytopenia 04/08/2020     Past Surgical History:  Procedure Laterality Date   APPENDECTOMY     FOOT TENDON SURGERY Right    TONSILLECTOMY         PREVIOUS MEDICATIONS:   CURRENT MEDICATIONS:  Outpatient Encounter Medications as of 07/21/2021  Medication Sig   amitriptyline (ELAVIL) 25 MG tablet TAKE 1 TABLET BY MOUTH EVERYDAY AT BEDTIME   BioGaia Probiotic (BIOGAIA/GERBER SOOTHE) LIQD Take 5 drops by mouth daily at 8 pm.   busPIRone (BUSPAR) 30 MG tablet TAKE 1 TABLET BY MOUTH 2 TIMES DAILY.   clonazePAM (KLONOPIN) 1 MG tablet Take 1 tablet (1 mg total) by mouth 3 (three) times daily as needed for anxiety. Hold until he  requests, he will need filled earlier than expected b/c we increased dose to tid today, 04/22/2021.   clotrimazole-betamethasone (LOTRISONE) cream Apply 1 application topically daily. (Patient not taking: Reported on 06/25/2021)   docusate sodium (COLACE) 50 MG capsule Take 1 capsule (50 mg total) by mouth 2 (two) times daily. (Patient not taking: Reported on 06/25/2021)   fluvoxaMINE (LUVOX) 100 MG tablet Take 2 tablets (200 mg total) by mouth at bedtime.   hydrocortisone (ANUSOL-HC) 2.5 % rectal cream Place 1 application. rectally 2 (two) times daily. (Patient not taking: Reported on 07/06/2021)   ibuprofen (ADVIL) 600 MG tablet Take 1 tablet (600 mg total) by mouth every 8 (eight) hours as needed.   lamoTRIgine (LAMICTAL) 150 MG tablet Take 150 mg by mouth daily.   Methylfol-Algae-B12-Acetylcyst (CEREFOLIN NAC) 6-90.314-2-600 MG TABS Take 1 tablet by mouth daily.   mometasone (ELOCON) 0.1 % cream Apply topically in the morning and at bedtime. (Patient not taking: Reported on 07/06/2021)   naproxen (NAPROSYN) 500 MG tablet Take 1 tablet (500 mg total) by mouth 2 (two) times daily with a meal.   psyllium (METAMUCIL) 58.6 % powder Take 1 packet by mouth in the morning and at bedtime.   No facility-administered encounter medications on file as of 07/21/2021.     Objective:     PHYSICAL EXAMINATION:    VITALS:  There were no vitals filed for this visit.  GEN:  The patient appears stated age and is in NAD. HEENT:  Normocephalic, atraumatic.   Neurological examination:  General: NAD, well-groomed, appears stated age. Orientation: The patient is alert. Oriented to person, place and date Cranial nerves: There is good facial symmetry. No Hypomimia. The speech is fluent and clear. No aphasia or dysarthria. Fund of knowledge is appropriate. Recent memory impaired and remote memory is normal.  Attention and concentration are normal.  Able to name objects and repeat phrases.  Hearing is intact to  conversational tone.    Sensation: Sensation is intact to light touch throughout Motor: Strength is at least antigravity x4. Tremors: none  DTR's 2/4 in UE/LE       No data to display  No data to display             Movement examination: Tone: There is normal tone in the UE/LE Abnormal movements:  no tremor.  No myoclonus.  No asterixis.   Coordination:  There is no decremation with RAM's. Normal finger to nose  Gait and Station: The patient has no difficulty arising out of a deep-seated chair without the use of the hands. The patient's stride length is good.  Gait is cautious and narrow.    ****Abnormal movements: there is no rest tremor.  There is no postural tremor.  There is no intention tremor.  There is a tremor on the R hand when pouring water (some entrainment).  he has no difficulty with archimedes spirals bilaterally. Coordination:  There is no decremation with RAM's, with any form of RAMS, including alternating supination and pronation of the forearm, hand opening and closing, finger taps, heel taps and toe taps bilaterally Gait and Station: The patient has no difficulty arising out of a deep-seated chair without the use of the hands. The patient's stride length is good    Thank you for allowing [place] the opportunity to participate in the care of this nice patient. Please do not hesitate to contact us for any questions or concerns.   Total time spent on today's visit was *** minutes dedicated to this patient today, preparing to see patient, examining the patient, ordering tests and/or medications and counseling the patient, documenting clinical information in the EHR or other health record, independently interpreting results and communicating results to the patient/family, discussing treatment and goals, answering patient's questions and coordinating care.     Cc:  Haydee Salter, MD Sharene Butters, PA-C

## 2021-07-23 ENCOUNTER — Ambulatory Visit (INDEPENDENT_AMBULATORY_CARE_PROVIDER_SITE_OTHER): Payer: Medicare Other | Admitting: Physician Assistant

## 2021-07-23 ENCOUNTER — Encounter: Payer: Self-pay | Admitting: Physician Assistant

## 2021-07-23 DIAGNOSIS — F419 Anxiety disorder, unspecified: Secondary | ICD-10-CM

## 2021-07-23 DIAGNOSIS — G251 Drug-induced tremor: Secondary | ICD-10-CM | POA: Insufficient documentation

## 2021-07-23 DIAGNOSIS — F331 Major depressive disorder, recurrent, moderate: Secondary | ICD-10-CM

## 2021-07-23 DIAGNOSIS — Z91148 Patient's other noncompliance with medication regimen for other reason: Secondary | ICD-10-CM | POA: Diagnosis not present

## 2021-07-23 DIAGNOSIS — F99 Mental disorder, not otherwise specified: Secondary | ICD-10-CM

## 2021-07-23 DIAGNOSIS — F5105 Insomnia due to other mental disorder: Secondary | ICD-10-CM

## 2021-07-23 HISTORY — DX: Drug-induced tremor: G25.1

## 2021-07-23 NOTE — Progress Notes (Unsigned)
[facility]  Patient ID: Richard Wilson,  MRN: [id number]  PCP: Haydee Salter, MD  Date of Evaluation: 07/23/2021 Tme spent:30 minutes  Chief Complaint:  Chief Complaint   Anxiety; Depression; Insomnia; Follow-up    HISTORY/CURRENT STATUS: HPI For routine med check.  He saw neurology, Sharene Butters, PA-C last week.  He reports that she feels like the tremor is coming from Klonopin.  He has been on Klonopin for over 20 years, we have tried to very slowly wean him off but he was not even able to tolerate a 0.125 mg reduction so we have left the Klonopin alone.  Without it, he has not been able to function as far as the anxiety goes.  Even with that he still feels panicky in social situations.  Has a hard time getting out of his comfort zone although the Klonopin helps some.  He states he can tolerate the tremor and does not want to go on any medication that may help it.  States he is frustrated.  He forgot his medications today.  He is not able to tell me for sure what medications he takes and what the doses are.  He still feels down, has a hard time enjoying things, energy and motivation are fair most of the time.  He is sleeping better since we added amitriptyline several months ago.  He sometimes gets 6 to 7 hours of sleep in a row.  Occasionally has to get up once during the night to go to the bathroom but is able to fall back asleep quickly.  Reports tossing and turning sometimes though.  ADLs and personal hygiene are normal.  Appetite is normal and weight is stable.  No suicidal or homicidal thoughts.  Continues to complain of tremor of his hands bilaterally, worse on the right, most noticeable if he has a glass or a mug containing liquid.  Not spilling anything though.  Denies dizziness, syncope, seizures, numbness, tingling, tics, unsteady gait, slurred speech, confusion. Denies muscle or joint pain, stiffness, or dystonia.  Individual Medical History/ Review of Systems:  Changes? :No     Past medications for mental health diagnoses include: Rozerem, Trazodone caused vivid dreams, Xanax, Buspar, mirtazapine caused excessive somnolence and fatigue with flulike symptoms, Belsomra, propranolol for tremor, Zoloft, Abilify caused tremor, Klonopin, Effexor, Ativan, Wellbutrin, Nardil, gabapentin, Librium   Pertinent info after reviewing Dr. Evelena Leyden notes, his previous psychiatrist in [facility]. Past suicide attempt in September 1996, by taking Tylenol.  He was admitted at that time to a psychiatric hospital in [facility].  Admitted to psych hospital multiple times in the past for "nervous breakdowns."  January 1985, February 1986, October 1991, September 1996, July 2000.  Allergies: Garamycin [gentamicin] and Penicillins  Current Medications:  Current Outpatient Medications:    amitriptyline (ELAVIL) 25 MG tablet, TAKE 1 TABLET BY MOUTH EVERYDAY AT BEDTIME, Disp: 90 tablet, Rfl: 0   BioGaia Probiotic (BIOGAIA/GERBER SOOTHE) LIQD, Take 5 drops by mouth daily at 8 pm., Disp: , Rfl:    clonazePAM (KLONOPIN) 1 MG tablet, Take 1 tablet (1 mg total) by mouth 3 (three) times daily as needed for anxiety. Hold until he requests, he will need filled earlier than expected b/c we increased dose to tid today, 04/22/2021., Disp: 90 tablet, Rfl: 1   lamoTRIgine (LAMICTAL) 150 MG tablet, Take 100 mg by mouth daily., Disp: , Rfl:    Methylfol-Algae-B12-Acetylcyst (CEREFOLIN NAC) 6-90.314-2-600 MG TABS, Take 1 tablet by mouth daily., Disp: 30  tablet, Rfl: 5   psyllium (METAMUCIL) 58.6 % powder, Take 1 packet by mouth in the morning and at bedtime., Disp: , Rfl:    vitamin B-12 (CYANOCOBALAMIN) 1000 MCG tablet, Take 1,000 mcg by mouth daily., Disp: , Rfl:    busPIRone (BUSPAR) 30 MG tablet, TAKE 1 TABLET BY MOUTH 2 TIMES DAILY. (Patient not taking: Reported on 07/21/2021), Disp: 180 tablet, Rfl: 1   clotrimazole-betamethasone (LOTRISONE) cream, Apply 1  application topically daily. (Patient not taking: Reported on 06/25/2021), Disp: 30 g, Rfl: 0   docusate sodium (COLACE) 50 MG capsule, Take 1 capsule (50 mg total) by mouth 2 (two) times daily. (Patient not taking: Reported on 06/25/2021), Disp: 10 capsule, Rfl: 0   fluvoxaMINE (LUVOX) 100 MG tablet, Take 2 tablets (200 mg total) by mouth at bedtime. (Patient not taking: Reported on 07/21/2021), Disp: 60 tablet, Rfl: 1   hydrocortisone (ANUSOL-HC) 2.5 % rectal cream, Place 1 application. rectally 2 (two) times daily. (Patient not taking: Reported on 07/06/2021), Disp: 30 g, Rfl: 0   ibuprofen (ADVIL) 600 MG tablet, Take 1 tablet (600 mg total) by mouth every 8 (eight) hours as needed. (Patient not taking: Reported on 07/21/2021), Disp: 30 tablet, Rfl: 0   mometasone (ELOCON) 0.1 % cream, Apply topically in the morning and at bedtime. (Patient not taking: Reported on 07/06/2021), Disp: 15 g, Rfl: 1   naproxen (NAPROSYN) 500 MG tablet, Take 1 tablet (500 mg total) by mouth 2 (two) times daily with a meal. (Patient not taking: Reported on 07/21/2021), Disp: 15 tablet, Rfl: 0 Medication Side Effects: sexual dysfunction   Family Medical/ Social History: Changes?  no  MENTAL HEALTH EXAM:  There were no vitals taken for this visit.There is no height or weight on file to calculate BMI.  General Appearance: Casual and Well Groomed  Eye Contact:  Good  Speech:  Clear and Coherent and Normal Rate  Volume:  Normal  Mood:  Euthymic  Affect:  Congruent  Thought Process:  Goal Directed and Descriptions of Associations: Circumstantial  Orientation:  Full (Time, Place, and Person)  Thought Content: Logical   Suicidal Thoughts:  No  Homicidal Thoughts:  No  Memory:  WNL  Judgement:  Good  Insight:  Good  Psychomotor Activity:  Normal and no tremor present at this time.  Concentration:  Concentration: Good and Attention Span: Good  Recall:  Good  Fund of Knowledge: Good  Language: Good  Assets:  Desire for  Improvement Financial Resources/Insurance Housing Transportation  ADL's:  Intact  Cognition: WNL  Prognosis:  Good   Gene site test results are on chart under media. See neuro psych test results on chart.[name] [name] 01/21/2020  Note from Sharene Butters, PA-C from 07/21/2021 reviewed.  DIAGNOSES:  No diagnosis found.   Receiving Psychotherapy: Yes    Dr. Jonni Sanger Mitchum.    RECOMMENDATIONS:  PDMP was reviewed.  Klonopin last filled 07/15/2021. I provided 20 minutes of face to face time during this encounter, including time spent before and after the visit in records review, medical decision making, counseling pertinent to today's visit, and charting.  We discussed the tremor.  Even if it is caused from [place] he and I both know it is very unlikely for him to be able to wean off of that.  He has tried several times over the course of years that he has been on it and has not been able to do so.  He would like to stay the course and  make no changes at this time. He will call the office back later today to give me an accurate list of his medications.   Continue counseling with Dr. [name]. Return in 2 months.  Donnal Moat, PA-C

## 2021-07-24 ENCOUNTER — Telehealth: Payer: Self-pay | Admitting: Physician Assistant

## 2021-07-24 ENCOUNTER — Ambulatory Visit (INDEPENDENT_AMBULATORY_CARE_PROVIDER_SITE_OTHER): Payer: Medicare Other | Admitting: Psychiatry

## 2021-07-24 DIAGNOSIS — F331 Major depressive disorder, recurrent, moderate: Secondary | ICD-10-CM

## 2021-07-24 DIAGNOSIS — Z1589 Genetic susceptibility to other disease: Secondary | ICD-10-CM

## 2021-07-24 DIAGNOSIS — F411 Generalized anxiety disorder: Secondary | ICD-10-CM | POA: Diagnosis not present

## 2021-07-24 DIAGNOSIS — F5105 Insomnia due to other mental disorder: Secondary | ICD-10-CM

## 2021-07-24 DIAGNOSIS — Z91148 Patient's other noncompliance with medication regimen for other reason: Secondary | ICD-10-CM

## 2021-07-24 DIAGNOSIS — F4010 Social phobia, unspecified: Secondary | ICD-10-CM | POA: Diagnosis not present

## 2021-07-24 DIAGNOSIS — F341 Dysthymic disorder: Secondary | ICD-10-CM | POA: Diagnosis not present

## 2021-07-24 DIAGNOSIS — R37 Sexual dysfunction, unspecified: Secondary | ICD-10-CM

## 2021-07-24 DIAGNOSIS — Z79899 Other long term (current) drug therapy: Secondary | ICD-10-CM

## 2021-07-24 DIAGNOSIS — F99 Mental disorder, not otherwise specified: Secondary | ICD-10-CM

## 2021-07-24 DIAGNOSIS — R69 Illness, unspecified: Secondary | ICD-10-CM

## 2021-07-24 NOTE — Telephone Encounter (Signed)
After review, I increased amitriptyline 25 mg to 2 p.o. nightly.  Note stating that increase was given to Dr. Farrel Demark, asking him to give to [facility] at his appointment with him today.

## 2021-07-24 NOTE — Progress Notes (Signed)
Psychotherapy Progress Note [facility] Richard Moore, PhD LP  Patient ID: Richard Wilson [name]    MRN: [id number] Therapy format: Individual psychotherapy Date: 07/24/2021      Start: 11:20a     Stop: 12:10p     Time Spent: 50 min Location: In-person   Session narrative (presenting needs, interim history, self-report of stressors and symptoms, applications of prior therapy, status changes, and interventions made in session) Upsurge in anxiety this week, made notes last night how it affects his attention, then starts a shame spiral, and feels all the more anxious, makes more mistakes, struggles with feeling like he's failing parental expectations until he feels he has to escape from basically everything.  Confronted shoulding about what he should have learned before, etc.  Oriented to vicious cycle of anxiety, lost attention, mistakes, and shoulding himself and redirected to change self-talk toward (a) acceptance of perceived failures and feelings at hand and (b) brainstorming better responses (more functional, or more respectable).  Confesses fantasizing last night of writing things down that either he thought would impress [place] and make more sense how he struggles, or that [place] would blow off the report.  Affirms neither one happened, and he does not need to dramatize things to [place] to make a difference, he just needs to apply   Epiphany that he is truly allowed to change his mind, he does not absolutely have to explain to others, just give notice of change in what to count on him for, esp as regards church choices.  Affirmed and encouraged.  Reviewed other issues in progress.  Delivered new med instructions from psychiatry, for clarity in making the transition and hopefully reliability at home self-administering.    Therapeutic modalities: Cognitive Behavioral Therapy, Solution-Oriented/Positive Psychology, Customer service manager, and Faith-sensitive  Mental  Status/Observations:  Appearance:   Casual and Neat     Behavior:  Appropriate  Motor:  Normal  Speech/Language:   Clear and Coherent  Affect:  Appropriate  Mood:  anxious and dysthymic  Thought process:  normal  Thought content:    worry  Sensory/Perceptual disturbances:    WNL  Orientation:  Fully oriented  Attention:  Good    Concentration:  Good  Memory:  grossly intact  Insight:    Good  Judgment:   Good  Impulse Control:  Fair   Risk Assessment: Danger to Self: No Self-injurious Behavior: No Danger to Others: No Physical Aggression / Violence: No Duty to Warn: No Access to Firearms a concern: No  Assessment of progress:  progressing  Diagnosis:   ICD-10-CM   1. Major depressive disorder, recurrent episode, moderate (HCC)  F33.1     2. 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 Social anxiety disorder  F40.10     4. Generalized anxiety disorder  F41.1     5. r/o MCI, predementia vs. nutritional, stress, and medication effects  R69     6. Poor compliance with medication  Z91.148     7. Sexual dysfunction  R37     8. Heterozygous MTHFR mutation C677T  Z15.89     9. Long-term current use of benzodiazepine  Z79.899     10. Insomnia due to other mental disorder  F51.05    F99      Plan:  See neurologist as planned, forecast possible nerve impingement syndrome in C-spine tying trapezius pain and hand tremor together.   Try antiinflammatory topical for strongly suspected thumb arthritis Worth trying Viagra again, but defer  to conscience Continue to recommend a video curfew to help with erratic sleep pattern, and indirectly with tremor Options and encouragement to shop further for suitable churches.  May look into openly gay friendly congregations by searching online.  Tentative recommendation to [facility] for combination of social class and demographic, relatively high church worship service, commitment to [employer], and breadth of program in small group  connections available Maintain nutritional improvements, check B12 if concern for oversupplementation.  Cerefolin is preferable given MTHFR results. Maintain sleep scheduling Still recommend walking Continue to brainstorm options for finding people with compatible interests here in [place] to prioritize physiological and circadian regulation before trying again to wean benzodiazepine Other recommendations/advice as may be noted above Continue to utilize previously learned skills ad lib Maintain medication as prescribed and work faithfully with relevant prescriber(s) if any changes are desired or seem indicated Call the clinic on-call service, 988/hotline, 911, or present to [facility] or ER if any life-threatening psychiatric crisis Return for session(s) already scheduled. Already scheduled visit in this office 07/31/2021.  Blanchie Serve, PhD Richard Moore, PhD LP Clinical Psychologist, [facility] [address] [place], [place] [address] [phone]

## 2021-07-24 NOTE — Telephone Encounter (Signed)
Richard Wilson was seen yesterday and has called back to give update on his medications and supplements that he is currently taking. They are:  Amitriptyline, one by mouth at bedtime Lamictal, one by mouth everyday Clonopin, takes one 3 times a day B12, 1000 mcg daily Probiotic daily.

## 2021-07-26 MED ORDER — AMITRIPTYLINE HCL 25 MG PO TABS: 50.0000 mg | ORAL_TABLET | Freq: Every day | ORAL | 0 refills | Status: DC

## 2021-07-29 ENCOUNTER — Telehealth: Payer: Self-pay | Admitting: Family Medicine

## 2021-07-29 ENCOUNTER — Ambulatory Visit (INDEPENDENT_AMBULATORY_CARE_PROVIDER_SITE_OTHER): Payer: Medicare Other | Admitting: Family Medicine

## 2021-07-29 ENCOUNTER — Encounter: Payer: Self-pay | Admitting: Family Medicine

## 2021-07-29 ENCOUNTER — Ambulatory Visit (HOSPITAL_COMMUNITY)
Admission: RE | Admit: 2021-07-29 | Discharge: 2021-07-29 | Disposition: A | Discharge status: Home / Self Care | Payer: Medicare Other | Source: Ambulatory Visit | Attending: Family Medicine | Admitting: Family Medicine

## 2021-07-29 ENCOUNTER — Encounter (HOSPITAL_COMMUNITY): Payer: Self-pay

## 2021-07-29 VITALS — BP 122/83 | HR 100 | Temp 98.2°F | Resp 18 | Ht 69.0 in | Wt 180.0 lb

## 2021-07-29 VITALS — BP 120/76 | HR 102 | Temp 97.6°F | Ht 69.0 in | Wt 181.8 lb

## 2021-07-29 DIAGNOSIS — R519 Headache, unspecified: Secondary | ICD-10-CM | POA: Diagnosis not present

## 2021-07-29 DIAGNOSIS — R6884 Jaw pain: Secondary | ICD-10-CM

## 2021-07-29 MED ORDER — PREDNISONE 20 MG PO TABS: 40.0000 mg | ORAL_TABLET | Freq: Every day | ORAL | 0 refills | Status: DC

## 2021-07-29 MED ORDER — HYDROCODONE-ACETAMINOPHEN 5-325 MG PO TABS: 1.0000 | ORAL_TABLET | Freq: Four times a day (QID) | ORAL | 0 refills | Status: DC | PRN

## 2021-07-29 NOTE — ED Provider Notes (Signed)
[facility]   [id number] 07/29/21 Arrival Time: 4098  ASSESSMENT & PLAN:  1. Jaw pain    Unclear etiology. Suspect TMJ related. Reports his PCP is referring him to ENT.  Begin: New Prescriptions   HYDROCODONE-ACETAMINOPHEN (NORCO/VICODIN) 5-325 MG TABLET    Take 1 tablet by mouth every 6 (six) hours as needed for moderate pain or severe pain.   PREDNISONE (DELTASONE) 20 MG TABLET    Take 2 tablets (40 mg total) by mouth daily.   No signs of oral infection.   Follow-up Information     [facility].   Specialty: Emergency Medicine Why: If symptoms worsen in any way. Contact information: [address] [id number] [place] [phone]                Reviewed expectations re: course of current medical issues. Questions answered. Outlined signs and symptoms indicating need for more acute intervention. Understanding verbalized. After Visit Summary given.   SUBJECTIVE: History from: Patient. Juanangel Soderholm is a 71 y.o. male. Reports: R jaw pain; anterior to R ear; x 2 weeks; gradual worsening; pain worse with swallowing; afebrile. Tolerating PO intake without n/v/d. Saw PCP this am; referring to ENT. No neck swelling.  OBJECTIVE:  Vitals:   07/29/21 1421 07/29/21 1423  BP: 122/83   Pulse: 100   Resp: 18   Temp: 98.2 F (36.8 C)   TempSrc: Oral   SpO2: 95%   Weight:  81.6 kg  Height:  '5\' 9"'$  (1.753 m)    General appearance: alert; no distress Eyes: PERRLA; EOMI; conjunctiva normal HENT: [place]; AT; without nasal congestion; mild soreness over R mandible; no swelling/erythema Neck: supple  Lungs: speaks full sentences without difficulty; unlabored Extremities: no edema Skin: warm and dry Neurologic: normal gait Psychological: alert and cooperative; normal mood and affect   Allergies  Allergen Reactions   Garamycin [Gentamicin] Other (See Comments)    Eyes itched and were  edematous   Penicillins     Pt reports is childhood allergy and does not remember the reaction    Past Medical History:  Diagnosis Date   Arthritis of knee 08/31/2017   Chronic idiopathic constipation 10/23/2020   Chronic kidney disease, stage 3a 96/29/5284   Diastolic dysfunction 13/24/4010   Diverticulosis of large intestine without hemorrhage 09/30/2013   Elevated PSA 12/22/2012   Erectile dysfunction 10/23/2020   Generalized anxiety disorder 01/01/2020   Hyperglycemia 09/24/2020   Intention tremor    Major depressive disorder 01/01/2020   Mild cognitive impairment of uncertain or unknown etiology 01/21/2021   Mild mitral regurgitation 08/15/2013   Nephrolithiasis 06/22/2016   Obstructive sleep apnea 04/13/2015   Orthostatic lightheadedness 10/23/2020   Primary osteoarthritis of both hips 07/30/2019   Pure hypercholesterolemia 12/15/2018   RBBB (right bundle branch block) 12/03/2012   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   Thrombocytopenia 04/08/2020   Social History   Socioeconomic History   Marital status: Single    Spouse name: Not on file   Number of children: 0   Years of education: 18   Highest education level: Master's degree (e.g., MA, MS, MEng, MEd, MSW, MBA)  Occupational History   Occupation: Disability    Comment: 2001 for GAD   Occupation: Licensed conveyancer   Occupation: Retired    Comment: Former Art therapist  Tobacco Use   Smoking status: Never   Smokeless tobacco: Never  Vaping Use   Vaping Use: Never used  Substance and Sexual Activity  Alcohol use: Not Currently    Comment: occasional glass of wine   Drug use: Never   Sexual activity: Not Currently  Other Topics Concern   Not on file  Social History Narrative   Grew up in [place]. Had a good childhood. Never abused.    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    Never married. No kids. Is gay.      No legal trouble.   [place]   RIGHT HANDED   Social Determinants of Health   Financial Resource Strain: Not on file  Food Insecurity: Not on file  Transportation Needs: Not on file  Physical Activity: Not on file  Stress: Not on file  Social Connections: Not on file  Intimate Partner Violence: Not on file   Family History  Problem Relation Age of Onset   Cancer Mother        Ovarian   Heart disease Mother    Memory loss Mother        with advanced age   Prostate cancer Father    Throat cancer Maternal Grandfather    Heart attack Paternal Grandfather    Cancer Maternal Aunt        Breast, metastatic   Past Surgical History:  Procedure Laterality Date   APPENDECTOMY     FOOT TENDON SURGERY Right    TONSILLECTOMY       Vanessa Kick, MD 07/29/21 1556

## 2021-07-29 NOTE — Telephone Encounter (Signed)
Pt was just seen today. He called the ENT referral he was given. This provider can not see him today. He would like a different referral that will see him today.

## 2021-07-29 NOTE — Patient Instructions (Signed)
Apply hot compresses to the right cheek for 10 minutes three times a day. Suck on lemon wedges 2-3 times a day. Drink water in between bites of food.

## 2021-07-29 NOTE — ED Triage Notes (Signed)
Patient c/o left sided jaw pain x 2 weeks, unable to eat, very painful.  Seen his dentist unable to find anything.  Patient requesting an xray and denies any pain meds.

## 2021-07-29 NOTE — Progress Notes (Signed)
[facility] [address] [place] [address] Dept: [phone] Dept Fax: [phone]  Office Visit  Subjective:    Patient ID: Richard Wilson, male    DOB: Mar 30, 1950, 71 y.o..   MRN: [id number]  Chief Complaint  Patient presents with   Acute Visit    C/o having Rt jaw pain  since last OV.  Went to dentist and they found nothing wrong.      History of Present Illness:  Patient is in today for reassessment of right cheek/jaw pain. Richard Wilson was seen initially by Dr. Grandville Silos on 06/25/2021 with a complain of right lower jaw pain that had been present for several weeks. He had noted his pain was increased with eating, but not specifically with chewing. Dr. Grandville Silos felt there might have been an issue with a tooth sensitivity. He had recommended an NSAID and use of Sensodyne toothpaste. Richard Wilson notes that he did not try these approaches, as he really did not feel the teeth were involved. He did go to see his dentist for an exam and told it was normal, though no x-rays were taken.  Today, Richard Wilson notes ongoing discomfort. He confirms that eating does tend to worsen this. He does note that one of his medications causes him to have a dry mouth much of the time. He denies any TMJ or ear symptoms.  Past Medical History: Patient Active Problem List   Diagnosis Date Noted   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   Mild cognitive impairment of uncertain or unknown etiology 01/21/2021   Intention tremor    Chronic idiopathic constipation 10/23/2020   Orthostatic lightheadedness 10/23/2020   Erectile dysfunction 10/23/2020   Hyperglycemia 09/24/2020   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   Thrombocytopenia 04/08/2020   Generalized anxiety disorder 01/01/2020   Major depressive disorder 01/01/2020   Primary osteoarthritis of both hips 07/30/2019   Chronic kidney disease, stage 3a 03/19/2019   Hyperlipidemia  12/15/2018   Arthritis of knee 08/31/2017   Nephrolithiasis 06/22/2016   Obstructive sleep apnea 04/13/2015   Diverticulosis of large intestine without hemorrhage 09/30/2013   Mild mitral regurgitation 00/93/8182   Diastolic dysfunction 99/37/1696   Elevated PSA 12/22/2012   RBBB (right bundle branch block) 12/03/2012   Past Surgical History:  Procedure Laterality Date   APPENDECTOMY     FOOT TENDON SURGERY Right    TONSILLECTOMY     Family History  Problem Relation Age of Onset   Cancer Mother        Ovarian   Heart disease Mother    Memory loss Mother        with advanced age   Prostate cancer Father    Throat cancer Maternal Grandfather    Heart attack Paternal Grandfather    Cancer Maternal Aunt        Breast, metastatic   Outpatient Medications Prior to Visit  Medication Sig Dispense Refill   amitriptyline (ELAVIL) 25 MG tablet Take 2 tablets (50 mg total) by mouth at bedtime. 90 tablet 0   BioGaia Probiotic (BIOGAIA/GERBER SOOTHE) LIQD Take 5 drops by mouth daily at 8 pm.     busPIRone (BUSPAR) 30 MG tablet TAKE 1 TABLET BY MOUTH 2 TIMES DAILY. 180 tablet 1   clonazePAM (KLONOPIN) 1 MG tablet Take 1 tablet (1 mg total) by mouth 3 (three) times daily as needed for anxiety. Hold until he requests, he will need filled earlier than expected b/c we increased dose to tid  today, 04/22/2021. 90 tablet 1   clotrimazole-betamethasone (LOTRISONE) cream Apply 1 application topically daily. 30 g 0   docusate sodium (COLACE) 50 MG capsule Take 1 capsule (50 mg total) by mouth 2 (two) times daily. 10 capsule 0   hydrocortisone (ANUSOL-HC) 2.5 % rectal cream Place 1 application. rectally 2 (two) times daily. 30 g 0   ibuprofen (ADVIL) 600 MG tablet Take 1 tablet (600 mg total) by mouth every 8 (eight) hours as needed. 30 tablet 0   lamoTRIgine (LAMICTAL) 150 MG tablet Take 150 mg by mouth daily.     Methylfol-Algae-B12-Acetylcyst (CEREFOLIN NAC) 6-90.314-2-600 MG TABS Take 1 tablet by  mouth daily. 30 tablet 5   mometasone (ELOCON) 0.1 % cream Apply topically in the morning and at bedtime. 15 g 1   naproxen (NAPROSYN) 500 MG tablet Take 1 tablet (500 mg total) by mouth 2 (two) times daily with a meal. 15 tablet 0   psyllium (METAMUCIL) 58.6 % powder Take 1 packet by mouth in the morning and at bedtime.     vitamin B-12 (CYANOCOBALAMIN) 1000 MCG tablet Take 1,000 mcg by mouth daily.     No facility-administered medications prior to visit.   Allergies  Allergen Reactions   Garamycin [Gentamicin] Other (See Comments)    Eyes itched and were edematous   Penicillins     Pt reports is childhood allergy and does not remember the reaction     Objective:   Today's Vitals   07/29/21 0844  BP: 120/76  Pulse: (!) 102  Temp: 97.6 F (36.4 C)  TempSrc: Temporal  SpO2: 94%  Weight: 181 lb 12.8 oz (82.5 kg)  Height: '5\' 9"'$  (1.753 m)   Body mass index is 26.85 kg/m.   General: Well developed, well nourished. No acute distress. HEENT: Normocephalic, non-traumatic. Right external ear normal. Right EAC and TM normal. There are no clicks or   subluxation of the TMJ joint and no tenderness to palpation. Mucous membranes appear moist. No tenderness to the   gumline.Teeth are in good repair. Pain appears localized to the cheek. There is no swelling present. Neck: Supple. No lymphadenopathy. No thyromegaly. Psych: Alert and oriented. Normal mood and affect.  Health Maintenance Due  Topic Date Due   Hepatitis C Screening  Never done   Zoster Vaccines- Shingrix (1 of 2) Never done     Assessment & Plan:   1. Pain of cheek The etiology remains a bit unclear. I suspect that his complaint of a dry mouth and pain associated with eating may suggest issue with parotid gland irritation from poor saliva flow. This is potentially being brought on by his use of amitriptyline.  Apply hot compresses to the right cheek for 10 minutes three times a day. Suck on lemon wedges 2-3 times a  day. Drink water in between bites of food.  Mr. odwyer was insistent that he have further evaluation to reassure himself that this does not represent some worse issue. I will have him see ENT  - Ambulatory referral to ENT   Return if symptoms worsen or fail to improve.   Haydee Salter, MD

## 2021-07-29 NOTE — Discharge Instructions (Signed)

## 2021-07-29 NOTE — Telephone Encounter (Signed)
Patient advised that it takes time to get referral to them and that no ENT will see him today.  He will wait for them to call him. No further questions. Dm/cma

## 2021-07-30 ENCOUNTER — Telehealth: Payer: Self-pay | Admitting: Family Medicine

## 2021-07-30 NOTE — Telephone Encounter (Signed)
Pt called Dr. Deeann Saint office referral # [phone]. They told pt Dr Benjamine Mola is no longer working on these procedures any longer. Please change pt's referral to a different office.

## 2021-07-31 ENCOUNTER — Ambulatory Visit (INDEPENDENT_AMBULATORY_CARE_PROVIDER_SITE_OTHER): Payer: Medicare Other | Admitting: Psychiatry

## 2021-07-31 DIAGNOSIS — F5105 Insomnia due to other mental disorder: Secondary | ICD-10-CM

## 2021-07-31 DIAGNOSIS — F4010 Social phobia, unspecified: Secondary | ICD-10-CM | POA: Diagnosis not present

## 2021-07-31 DIAGNOSIS — R37 Sexual dysfunction, unspecified: Secondary | ICD-10-CM

## 2021-07-31 DIAGNOSIS — F341 Dysthymic disorder: Secondary | ICD-10-CM | POA: Diagnosis not present

## 2021-07-31 DIAGNOSIS — F411 Generalized anxiety disorder: Secondary | ICD-10-CM

## 2021-07-31 DIAGNOSIS — Z1589 Genetic susceptibility to other disease: Secondary | ICD-10-CM

## 2021-07-31 DIAGNOSIS — F331 Major depressive disorder, recurrent, moderate: Secondary | ICD-10-CM | POA: Diagnosis not present

## 2021-07-31 DIAGNOSIS — R69 Illness, unspecified: Secondary | ICD-10-CM

## 2021-07-31 DIAGNOSIS — F99 Mental disorder, not otherwise specified: Secondary | ICD-10-CM

## 2021-07-31 DIAGNOSIS — F428 Other obsessive-compulsive disorder: Secondary | ICD-10-CM

## 2021-07-31 NOTE — Telephone Encounter (Signed)
Pt's referral sent to  [facility]  [place].  Letter Sent Via Mychart to pt with referral details

## 2021-07-31 NOTE — Progress Notes (Signed)
Psychotherapy Progress Note [facility] Richard Moore, PhD LP  Patient ID: Richard Wilson [facility] "Richard Wilson")    MRN: [id number] Therapy format: Individual psychotherapy Date: 07/31/2021      Start: 10:15a     Stop: 11:15p     Time Spent: 60 min Location: In-person   Session narrative (presenting needs, interim history, self-report of stressors and symptoms, applications of prior therapy, status changes, and interventions made in session) Brings a clipboard today, a good sign for making use of the time and reinforcing his memory.    Note he's been having intrusive experience lately of feeling mother is in the next room and has to remind himself she's long passed.  Been getting urgent treatment for dental pain recently, asked for an xray, was told it would need MRI, frustrated with PCP.  Saw urgent care, now on Vicodin and Prednisone, for 9 days he says.  Temporarily portrays irritation with PCP and wanting to ask what he's "doing" about the problem.  Confronted that PCP can't possibly know yet what Richard Wilson is doing about it, nor how he feels at the moment, so it behooves him to fully inform PCP before leaping to any judgment.  Richard Wilson called him unexpectedly, with news of recruiting other people for altar guild at [employer], and asking when he's coming back, sensitively offering that he does not have to be on altar guild.  Acknowledged that as proof of her listening, contrary to what he had thought before, and it was reassuring, settled some bitterness.  Still optional what to choose for church.  Been challenging himself to balance his checkbook, unable to get it straight.  3rd time now he's checked with the bank and accepted their balance and punted his own math.  Has found mistakes of omission, arithmetic, and misapplying addition and subtraction.  Though maddened by finding his mistakes.  Managed a few days this week to keep up with logging and doing arithmetic promptly,  though he did face tears in the process.  Affirmed and encouraged, offering that if he wants to go over financials together to see if we can find any efficiencies in record-keeping and keeping current, he may.  Admits doing some "retail therapy" lately, inc thrift shopping at [place], relatively inexpensive [employer].  Leaving it to his judgment whether he is straining his finances or just exercising a bit of luxury discretion.  Therapeutic modalities: Cognitive Behavioral Therapy, Solution-Oriented/Positive Psychology, Customer service manager, and Faith-sensitive  Mental Status/Observations:  Appearance:   Casual     Behavior:  Appropriate  Motor:  Normal  Speech/Language:   Clear and Coherent  Affect:  Appropriate  Mood:  dysthymic and more sedate  Thought process:  normal  Thought content:    worry  Sensory/Perceptual disturbances:    WNL  Orientation:  Fully oriented  Attention:  Good    Concentration:  Good  Memory:  grossly intact  Insight:    Good  Judgment:   Good  Impulse Control:  Fair   Risk Assessment: Danger to Self: No Self-injurious Behavior: No Danger to Others: No Physical Aggression / Violence: No Duty to Warn: No Access to Firearms a concern: No  Assessment of progress:  progressing  Diagnosis:   ICD-10-CM   1. Major depressive disorder, recurrent episode, moderate (HCC)  F33.1     2. Generalized anxiety disorder  F41.1     3. Social anxiety disorder  F40.10     4. 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  5. r/o MCI, predementia vs. nutritional, stress, and medication effects  R69     6. Compulsive shopping  F42.8     7. Insomnia due to other mental disorder  F51.05    F99     8. Sexual dysfunction  R37     9. Heterozygous MTHFR mutation C677T  Z15.89      Plan:  See neurologist as planned, forecast possible nerve impingement syndrome in C-spine tying trapezius pain and hand tremor together.   Try antiinflammatory topical for strongly suspected thumb  arthritis Worth trying Viagra again, but defer to conscience Continue to recommend a video curfew to help with erratic sleep pattern, and indirectly with tremor Maintain sleep scheduling Still recommend walking Maintain nutritional improvements, check B12 if concern for oversupplementation.  Cerefolin is preferable given MTHFR results. Options to shop further for suitable churches.  May look into openly gay friendly congregations by searching online.  Tentative recommendation to [facility] for combination of social class and demographic, relatively high church worship service, commitment to [employer], and breadth of program in small group connections available. Continue to brainstorm options for finding people with compatible interests here in [place] to prioritize physiological and circadian regulation before trying again to wean benzodiazepine Other recommendations/advice as may be noted above Continue to utilize previously learned skills ad lib Maintain medication as prescribed and work faithfully with relevant prescriber(s) if any changes are desired or seem indicated Call the clinic on-call service, 988/hotline, 911, or present to [facility] or ER if any life-threatening psychiatric crisis Return for session(s) already scheduled. Already scheduled visit in this office 08/07/2021.  Blanchie Serve, PhD Richard Moore, PhD LP Clinical Psychologist, [facility] [address], [place], [place] [address] [phone]

## 2021-08-02 ENCOUNTER — Other Ambulatory Visit: Payer: Self-pay | Admitting: Physician Assistant

## 2021-08-05 ENCOUNTER — Ambulatory Visit (INDEPENDENT_AMBULATORY_CARE_PROVIDER_SITE_OTHER): Payer: Medicare Other | Admitting: Family Medicine

## 2021-08-05 ENCOUNTER — Encounter: Payer: Self-pay | Admitting: Family Medicine

## 2021-08-05 VITALS — BP 124/78 | HR 107 | Temp 97.9°F | Ht 69.0 in | Wt 186.8 lb

## 2021-08-05 DIAGNOSIS — F411 Generalized anxiety disorder: Secondary | ICD-10-CM | POA: Diagnosis not present

## 2021-08-05 DIAGNOSIS — F331 Major depressive disorder, recurrent, moderate: Secondary | ICD-10-CM

## 2021-08-05 DIAGNOSIS — M79644 Pain in right finger(s): Secondary | ICD-10-CM | POA: Diagnosis not present

## 2021-08-05 DIAGNOSIS — G3184 Mild cognitive impairment, so stated: Secondary | ICD-10-CM | POA: Diagnosis not present

## 2021-08-05 DIAGNOSIS — R519 Headache, unspecified: Secondary | ICD-10-CM | POA: Diagnosis not present

## 2021-08-05 NOTE — Progress Notes (Addendum)
[facility] [address] [place] [address] Dept: [phone] Dept Fax: [phone]  Office Visit  Subjective:    Patient ID: Richard Wilson, male    DOB: 1950-10-11, 71 y.o..   MRN: [id number]  Chief Complaint  Patient presents with   Follow-up    Jaw pain.     History of Present Illness:  Patient is in today for follow-up from a  recent UC visit. I saw him on 6/28 for follow-up on a persistent right jaw/cheek pain. I felt this might be an issue with some parotid inflammation due to thickened secretions (possibly a drug side effect). Later that same day, he went to UC . They treated  him with a course of prednisone and a short course of pain meds. he has stopped taking the steroid. He notes that his pain has now largely resolved.  Richard Wilson also notes some pins and needles discomfort recently in his right thumb. He denies any specific injury. He does not note anything similar in the rest of the hand or other fingers.  Past Medical History: Patient Active Problem List   Diagnosis Date Noted   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   Mild cognitive impairment of uncertain or unknown etiology 01/21/2021   Intention tremor    Chronic idiopathic constipation 10/23/2020   Orthostatic lightheadedness 10/23/2020   Erectile dysfunction 10/23/2020   Hyperglycemia 09/24/2020   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   Thrombocytopenia 04/08/2020   Generalized anxiety disorder 01/01/2020   Major depressive disorder 01/01/2020   Primary osteoarthritis of both hips 07/30/2019   Chronic kidney disease, stage 3a 03/19/2019   Hyperlipidemia 12/15/2018   Arthritis of knee 08/31/2017   Nephrolithiasis 06/22/2016   Obstructive sleep apnea 04/13/2015   Diverticulosis of large intestine without hemorrhage 09/30/2013   Mild mitral regurgitation 53/97/6734   Diastolic dysfunction 19/37/9024   Elevated PSA 12/22/2012    RBBB (right bundle branch block) 12/03/2012   Past Surgical History:  Procedure Laterality Date   APPENDECTOMY     FOOT TENDON SURGERY Right    TONSILLECTOMY     Family History  Problem Relation Age of Onset   Cancer Mother        Ovarian   Heart disease Mother    Memory loss Mother        with advanced age   Prostate cancer Father    Throat cancer Maternal Grandfather    Heart attack Paternal Grandfather    Cancer Maternal Aunt        Breast, metastatic   Outpatient Medications Prior to Visit  Medication Sig Dispense Refill   amitriptyline (ELAVIL) 25 MG tablet Take 2 tablets (50 mg total) by mouth at bedtime. 90 tablet 0   BioGaia Probiotic (BIOGAIA/GERBER SOOTHE) LIQD Take 5 drops by mouth daily at 8 pm.     clonazePAM (KLONOPIN) 1 MG tablet Take 1 tablet (1 mg total) by mouth 3 (three) times daily as needed for anxiety. Hold until he requests, he will need filled earlier than expected b/c we increased dose to tid today, 04/22/2021. 90 tablet 1   clotrimazole-betamethasone (LOTRISONE) cream Apply 1 application topically daily. 30 g 0   fluvoxaMINE (LUVOX) 100 MG tablet Take 100 mg by mouth 2 (two) times daily.     HYDROcodone-acetaminophen (NORCO/VICODIN) 5-325 MG tablet Take 1 tablet by mouth every 6 (six) hours as needed for moderate pain or severe pain. 8 tablet 0   hydrocortisone (ANUSOL-HC) 2.5 % rectal  cream Place 1 application. rectally 2 (two) times daily. 30 g 0   ibuprofen (ADVIL) 600 MG tablet Take 1 tablet (600 mg total) by mouth every 8 (eight) hours as needed. 30 tablet 0   lamoTRIgine (LAMICTAL) 150 MG tablet Take 150 mg by mouth daily.     Methylfol-Algae-B12-Acetylcyst (CEREFOLIN NAC) 6-90.314-2-600 MG TABS Take 1 tablet by mouth daily. 30 tablet 5   mometasone (ELOCON) 0.1 % cream Apply topically in the morning and at bedtime. 15 g 1   naproxen (NAPROSYN) 500 MG tablet Take 1 tablet (500 mg total) by mouth 2 (two) times daily with a meal. 15 tablet 0    predniSONE (DELTASONE) 20 MG tablet Take 2 tablets (40 mg total) by mouth daily. 14 tablet 0   psyllium (METAMUCIL) 58.6 % powder Take 1 packet by mouth in the morning and at bedtime.     vitamin B-12 (CYANOCOBALAMIN) 1000 MCG tablet Take 1,000 mcg by mouth daily.     No facility-administered medications prior to visit.   Allergies  Allergen Reactions   Garamycin [Gentamicin] Other (See Comments)    Eyes itched and were edematous   Penicillins     Pt reports is childhood allergy and does not remember the reaction     Objective:   Today's Vitals   08/05/21 1312  BP: 124/78  Pulse: (!) 107  Temp: 97.9 F (36.6 C)  TempSrc: Temporal  SpO2: 96%  Weight: 186 lb 12.8 oz (84.7 kg)  Height: '5\' 9"'$  (1.753 m)   Body mass index is 27.59 kg/m.   General: Well developed, well nourished. No acute distress. HEENT: No swelling noted to the face. No pain on palpation overt he TMJ or cheek. Extremities: Full ROM of the thumb. No subluxation or evidence of ligament rupture. No crepitance.  Psych: Alert and oriented. Normal mood and affect.  Health Maintenance Due  Topic Date Due   Hepatitis C Screening  Never done   Zoster Vaccines- Shingrix (1 of 2) Never done     Assessment & Plan:   1. Pain of cheek Resolving at this point. It sounds like he will chose not to go to the ENT about this, but observe to see if this gets better. This seems a very reasonable approach.  2. Pain of right thumb I suspect this may be either mild arthritis or some mild neuritis due to pressure over the thenar eminence. We will observe this for now.   Return if symptoms worsen or fail to improve.   Haydee Salter, MD  Addendum:  Since Richard Wilson visit, I have been in contact with his psychologist, Blanchie Serve PhD.  He has concerns about whether Richard Wilson is keeping to his medication regimen at home. He apparently confuses the names of medications and the instructions on how to take these. He has  requested that I initiate a Home Health referral to have a nurse review his medications and their instructions. I have placed orders for this.  Haydee Salter, MD

## 2021-08-07 ENCOUNTER — Encounter: Payer: Self-pay | Admitting: Family Medicine

## 2021-08-07 ENCOUNTER — Other Ambulatory Visit: Payer: Self-pay | Admitting: Family Medicine

## 2021-08-07 ENCOUNTER — Ambulatory Visit (INDEPENDENT_AMBULATORY_CARE_PROVIDER_SITE_OTHER): Payer: Medicare Other | Admitting: Psychiatry

## 2021-08-07 DIAGNOSIS — F341 Dysthymic disorder: Secondary | ICD-10-CM | POA: Diagnosis not present

## 2021-08-07 DIAGNOSIS — G3184 Mild cognitive impairment, so stated: Secondary | ICD-10-CM | POA: Diagnosis not present

## 2021-08-07 DIAGNOSIS — F331 Major depressive disorder, recurrent, moderate: Secondary | ICD-10-CM

## 2021-08-07 DIAGNOSIS — F411 Generalized anxiety disorder: Secondary | ICD-10-CM | POA: Diagnosis not present

## 2021-08-07 DIAGNOSIS — G479 Sleep disorder, unspecified: Secondary | ICD-10-CM

## 2021-08-07 DIAGNOSIS — Z1589 Genetic susceptibility to other disease: Secondary | ICD-10-CM

## 2021-08-07 NOTE — Progress Notes (Signed)
Psychotherapy Progress Note [facility] Richard Moore, PhD LP  Patient ID: [facility] "Richard Wilson")    MRN: [id number] Therapy format: Individual psychotherapy Date: 08/07/2021      Start: 8:15a     Stop: 9:15a     Time Spent: 60 min Location: In-person   Session narrative (presenting needs, interim history, self-report of stressors and symptoms, applications of prior therapy, status changes, and interventions made in session) Dropped himself off prednisone abruptly July 1, thinking it was [place]'s advice, says jaw pain came back pretty immediately, and he's been feeling hopeless for several days.  As of this morning, left a before-hours message with PCP office, hoping for a callback.  No f/u with dentist, and still feels stuck without an alternative after PCP's referral, understood to be an endodontist, turned out to be not doing that work any more.  Fears he alienated his PCP by making a pass at him the other day.  By description, it sounds to be a histrionic moment acting out simultaneous frustrations with loneliness, pain, and perceived service, but it is plausible that he alienated.  Abruptly c/o being unable to balance his checking again after several days' success.  Mystified by balance $500 higher than expected, prone to throw up his hands about even trying to manage.  Supportively confronted catastrophizing and broke down the issue together, identifying likely causes in forgotten auto-deposits (interest on an annuity) or possible neglected rent payment.  Otherwise probed his awareness of monies in and out of his account, which sounded broadly aware but easily put off from concentrating long enough to do the math, and possibly prone to math errors and errors of neglect when applying himself to it.  Has had the occasional problem missing a rent payment, but most items are on automatic payment or deposit.  Admittedly not opening statements regularly.  Encouraged to look  again if moved, offered to review together next session if he feels it would help.    Messaged PCP in session reporting bona fide pain relapse, asking for clarification, and noting cognitive impairments enough to request authorization of a home health visit for firsthand assessment of his medication management.  According to EHR, pt was actually seen 6/28 at urgent care (not 9 days before last Friday), on the same day he'd already seen PCP and referred, and he just saw PCP again 7/5, where he reported full relief from pain despite being off both the steroid and the opioid, and plan was to simply go on without pursuing the referral.  Further check of EHR shows that prednisone actually ended that day, so antiinflammatory effect would still be in action.  EHR messaging after session made clear that PCP hypothesized parotid inflammation but not enough signs on physical exam to order a scan, and he agreed that urgent care steroid was probably inappropriate and could have contributed to his depressive turn, pain problem read more as depressive somaticizing, and he should prioritize getting mental health issues worked out better before taking any aggressive medical steps, albeit with ENT referral still outstanding.  Also confirmed PT made a pass.  Assurances provided PT is no threat of harassment but playing out his frustrations and loneliness by breaking social mores, and he certainly understands no doctor will actually consent to be a lover, just that lonely and despairing of ever having a partner, and easily piqued by pain and perceived lack of service.  Meanwhile, still request home health assessment referral, as neuropsych validates cognitive impairments enough  to warrant cognitive and executive function work, not just emotional adjustment, and being very clear and verifying his health care instructions.  Ongoing deficits in nutrition and sleep probably still contribute, as much as any emotional issues, to problems  with comprehension, perception, and organization, and he is also in a series of medication transitions, not the least of which is abrupt onset/offset from prednisone.  Therapeutic modalities: Cognitive Behavioral Therapy, Solution-Oriented/Positive Psychology, Psycho-education/Bibliotherapy, and Assertiveness/Communication  Mental Status/Observations:  Appearance:   Neat     Behavior:  Mildly histrionic  Motor:  Normal  Speech/Language:   Clear and Coherent and some dysarthria  Affect:  Appropriate  Mood:  depressed and mildly irritable  Thought process:  normal  Thought content:    Worry, despairing  Sensory/Perceptual disturbances:    WNL exc report substantial pain right cheek/jowl  Orientation:  Fully oriented  Attention:  Good    Concentration:  Good  Memory:  Vague in places, error-prone  Insight:    Good  Judgment:   Good  Impulse Control:  Fair   Risk Assessment: Danger to Self: No Self-injurious Behavior: No Danger to Others: No Physical Aggression / Violence: No Duty to Warn: No Access to Firearms a concern: No  Assessment of progress:  situational setback(s)  Diagnosis:   ICD-10-CM   1. Major depressive disorder, recurrent episode, moderate (HCC)  F33.1     2. Generalized anxiety disorder  F41.1     3. 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 Mild cognitive impairment of uncertain or unknown etiology -- suspect combination of nutritional deficiency, anxiety, sedative effects, and dyssomnia with possible apnea  G31.84     5. 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 Heterozygous MTHFR mutation C677T  Z15.89      Plan:  Care coordination with psychiatry and PCP as able.  Both depression and assertive pain correspond to being 2 days off prednisone after 1 week of '40mg'$ . Re-approach PCP with update of symptoms and get clear what to do, what to wait for, and how to cope with suspected parotid pain until specialist can engage, or other treatment determined. Work with PCP in good faith  to fully inform him of questions and needs, and to fully comprehend medical advice.  Obviously, don't make any more passes, but I will interpret as needed. Maintain hydration to combat dry mouth and work out suspected salivary gland inflammation.  May need Biotene or a similar remedy if he should expect to contend with medication-induced dry mouth.  (See PCP or psychiatry if connected.) Nutritionally, maintain varied food sources, try to improve protein content, regulate vitamin supplementation, restrain late carbs.  Will need to verify compliance with Cerefolin, given MTHFR and cognitive needs in evidence. Next session can review banking information together to see if we can find efficiencies and establish a more foolproof method of keeping up with  Recommend a home health assessment (probing this with PCP) to verify that medication organization is compliant and capable. Other recommendations/advice as may be noted above Continue to utilize previously learned skills ad lib Maintain medication as prescribed and work faithfully with relevant prescriber(s) if any changes are desired or seem indicated Call the clinic on-call service, 988/hotline, 911, or present to [facility] or ER if any life-threatening psychiatric crisis Return for session(s) already scheduled. Already scheduled visit in this office 08/20/2021.  Blanchie Serve, PhD Richard Moore, PhD LP Clinical Psychologist, [facility] [address]  [place],  [address] ([phone]

## 2021-08-07 NOTE — Progress Notes (Deleted)
I received a message form Richard Wilson psychologist who has concerns about whether Richard Wilson is keeping to his medication regimen at home. He apparently confuses the names of medications and the instructions on how to take these. He has requested that I initiate a Home Health referral to have a nurse review his medications and their instructions.

## 2021-08-10 ENCOUNTER — Ambulatory Visit (INDEPENDENT_AMBULATORY_CARE_PROVIDER_SITE_OTHER): Payer: Medicare Other | Admitting: Psychiatry

## 2021-08-10 ENCOUNTER — Other Ambulatory Visit: Payer: Self-pay | Admitting: Physician Assistant

## 2021-08-10 ENCOUNTER — Telehealth: Payer: Self-pay | Admitting: Physician Assistant

## 2021-08-10 DIAGNOSIS — F411 Generalized anxiety disorder: Secondary | ICD-10-CM | POA: Diagnosis not present

## 2021-08-10 DIAGNOSIS — G3184 Mild cognitive impairment, so stated: Secondary | ICD-10-CM

## 2021-08-10 DIAGNOSIS — F341 Dysthymic disorder: Secondary | ICD-10-CM | POA: Diagnosis not present

## 2021-08-10 DIAGNOSIS — F331 Major depressive disorder, recurrent, moderate: Secondary | ICD-10-CM | POA: Diagnosis not present

## 2021-08-10 DIAGNOSIS — T887XXA Unspecified adverse effect of drug or medicament, initial encounter: Secondary | ICD-10-CM

## 2021-08-10 DIAGNOSIS — Z1589 Genetic susceptibility to other disease: Secondary | ICD-10-CM

## 2021-08-10 DIAGNOSIS — F4010 Social phobia, unspecified: Secondary | ICD-10-CM

## 2021-08-10 DIAGNOSIS — G479 Sleep disorder, unspecified: Secondary | ICD-10-CM

## 2021-08-10 MED ORDER — VITAMIN D 50 MCG (2000 UT) PO CAPS: 4000.0000 [IU] | ORAL_CAPSULE | Freq: Every day | ORAL | Status: DC

## 2021-08-10 MED ORDER — SILDENAFIL CITRATE 100 MG PO TABS: 100.0000 mg | ORAL_TABLET | Freq: Every day | ORAL | 0 refills | Status: DC | PRN

## 2021-08-10 MED ORDER — LAMOTRIGINE 100 MG PO TABS: 100.0000 mg | ORAL_TABLET | Freq: Every day | ORAL | 1 refills | Status: DC

## 2021-08-10 MED ORDER — AMITRIPTYLINE HCL 25 MG PO TABS: 25.0000 mg | ORAL_TABLET | Freq: Every day | ORAL | 0 refills | Status: DC

## 2021-08-10 NOTE — Telephone Encounter (Signed)
Great, thanks [place]. I'm including Dr. [name] as Juluis Rainier.

## 2021-08-10 NOTE — Telephone Encounter (Signed)
I asked patient to bring medications today and I will review them while he is seeing Dr. [name] and can message both of you if there are any issues.

## 2021-08-10 NOTE — Telephone Encounter (Signed)
Thanks [employer]

## 2021-08-10 NOTE — Telephone Encounter (Signed)
Richard Wilson, there may be confusion on which meds/doses he's taking. I have several available appts this week, so if he can, make an appt with me to go over his meds. If it's a financial hardship or if he's not available when I have openings, ask him to come in and see you, at your convenience.   It's VERY important that he bring in ALL his meds, no matter who prescribes them, even the OTC ones, including vitamins/supplements. Either you or I will go over everything he's taking, vs the ones he no longer takes. We'll dispose of the ones he doesn't, give back the bottles of the ones he does, and make a detailed list of what he should take and when. If he sees you, please let me review everything before he leaves. Thank you.

## 2021-08-10 NOTE — Telephone Encounter (Signed)
Patient has an appt today with Dr. [name] at 4:00. He is agreeable to making an appt with you. He can make the appt when he comes in this afternoon. I can have him bring his meds in this afternoon also and can go over them while he is seeing Dr. [name].

## 2021-08-10 NOTE — Progress Notes (Signed)
Psychotherapy Progress Note [facility] [name], PhD LP  Patient ID: Draedyn Weidinger [facility] [name])    MRN: [id number] Therapy format: Individual psychotherapy Date: 08/10/2021      Start: 4:15p     Stop: 5:40p     Time Spent: 85 min Location: In-person   Session narrative (presenting needs, interim history, self-report of stressors and symptoms, applications of prior therapy, status changes, and interventions made in session) Further communication with psychiatry and PCP.  Satisfied that boundary issues and concern for psychosomatic issues have been framed and agreed, and that timing of sxs with steroid administration as been shared.  On request of psychiatry, brought all his meds with him to clarify which he's taking, and which should be wasted.  Reviewed by medical assistant and psychiatric PA in extended, determined to waste 2 remaining Vicodin, 4 remaining prednisone (2 days' worth, meaning it was probably stopped Monday last week), and rectified Lamictal dosing, antidepressant dosing, and vitamin supplementation (only B12, no MVI, Cerefolin, nor B complex). Recommended to use a 7-day pill box with 3-4 times of day in order to concentrate organization time and better reveal missed doses.  Conferred with Ms. Adelene Idler in extended session.  Currently not on steroid, as noted.  Taking acetaminophen 2 QPM, which is helping pain in evening and into sleep time.  Less agitated today about getting something done, though he is frustrated with phone waits trying to arrange with the ENT Dr. Gena Fray referred him to.  Encouraged patience and making sure he sees through his duty as patient to make contact and be clear.  Brings bank statements and check registers as planned, though not the June statements, which he's not sure he has received.  (Statements print and send at month end, would presume delivered by now.)  Spot-checked his arithmetic in check registers, showing him what  happened.  All told, found several rewriting errors, arithmetic operations and concentration errors, and a few legibility errors, amounting to just over $800 discrepancy (optimistic balance).  Agreed to use an inferred balance, mark a break point in the register, and go forward from there rather than obsessively (abusively?) try to track down every (shameful) arithmetic error in the checkbook.  Take-home messages for balancing check register:  - Open statements promptly  - Identify any entries in the statement NOT in the register - Use calculator for arithmetic  - Verify digits when transferring from one place to another  - Don't do math too fatigued, wait and come back   - Self-remind daily balance with bank may not reflect very recent transactions  Reviewed advice on handling oral pain problem.  Therapeutic modalities: Cognitive Behavioral Therapy, Solution-Oriented/Positive Psychology, and Research scientist (physical sciences)  Mental Status/Observations:  Appearance:   Casual and Neat     Behavior:  Appropriate  Motor:  Normal  Speech/Language:   Clear and Coherent  Affect:  Appropriate, mildly dramatic  Mood:  anxious and depressed  Thought process:  normal  Thought content:    worry  Sensory/Perceptual disturbances:    WNL  Orientation:  Fully oriented  Attention:  Good    Concentration:  Fair  Memory:  Mild impairment  Insight:    Good  Judgment:   Fair  Impulse Control:  Fair   Risk Assessment: Danger to Self: No Self-injurious Behavior: No Danger to Others: No Physical Aggression / Violence: No Duty to Warn: No Access to Firearms a concern: No  Assessment of progress:  progressing  Diagnosis:   ICD-10-CM  1. Major depressive disorder, recurrent episode, moderate (HCC)  F33.1     2. Generalized anxiety disorder  F41.1     3. 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 Mild cognitive impairment of uncertain or unknown etiology -- suspect combination of nutritional deficiency, anxiety,  sedative effects, and dyssomnia with possible apnea  G31.84     5. 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 Heterozygous MTHFR mutation C677T  Z15.89     7. Social anxiety disorder  F40.10     8. Medication side effects (dry mouth)  T88.7XXA      Plan:  Impounded and wasted improper meds as noted Use a standard pillbox to organize meds rather than rely on daily concentration and organization Prefer Cerefolin to straight B12 based on history and assessment, but understand prohibitive cost.  Still recommend OTC B complex as preferable to B12 only, given indications of broader nutritional issues Nutritionally, maintain varied food sources, try to improve protein content, regulate vitamin supplementation, restrain late carbs.  Continue improved vegetable content but go for reduced-sodium if canned juice. Address mouth/jaw pain with approved use of OTC pain relief, PCP's advice, and see through given referral and feedback to PCP Work with PCP in good faith to fully inform him of questions and needs, ask any needed questions, and fully comprehend medical advice, seeking written instructions if may help. Maintain hydration to combat dry mouth and work out suspected salivary gland inflammation.  May need Biotene or a similar remedy if he should expect to contend with medication-induced dry mouth.  (See PCP or psychiatry if connected.) Care coordination with psychiatry and PCP ad lib Home health assessment coming Banking and arithmetic tips above Other recommendations/advice as may be noted above Continue to utilize previously learned skills ad lib Maintain medication as prescribed and work faithfully with relevant prescriber(s) if any changes are desired or seem indicated Call the clinic on-call service, 988/hotline, 911, or present to [facility] or ER if any life-threatening psychiatric crisis Return for session(s) already scheduled. Already scheduled visit in this office 08/20/2021.  Blanchie Serve, PhD [name], PhD LP Clinical Psychologist, [facility] [address] [place], [place] [address] [phone]

## 2021-08-13 ENCOUNTER — Telehealth: Payer: Self-pay | Admitting: Family Medicine

## 2021-08-13 NOTE — Telephone Encounter (Signed)
Patient notified VIA phone that if he finds a doctor that can seen to call us if they need the referral faxed to them.  Dm/cma

## 2021-08-13 NOTE — Telephone Encounter (Signed)
Pain in right jaw -Pt stated that you referred him to go to [facility] and the dr. There said that they do not that type of exam . So that dr. Referred him to dr Remo Lipps [facility] and pt stated that he don't open until 1:00 today and the other dr. Katharine Look fuller do not accept medicare . What should he do give pt a call

## 2021-08-14 ENCOUNTER — Telehealth: Payer: Self-pay | Admitting: Family Medicine

## 2021-08-14 NOTE — Telephone Encounter (Signed)
error 

## 2021-08-14 NOTE — Telephone Encounter (Signed)
Called patient, he received a call from a home heatlh agency and don't know who it was that called.  Do you have the number I can give him so he can call them back? Please and thank you.   Dm/cma

## 2021-08-14 NOTE — Telephone Encounter (Signed)
Pt stated that he wanted to know did you refer someone to come to his home.. because they wanted to make appointment to come see the pt

## 2021-08-17 ENCOUNTER — Other Ambulatory Visit: Payer: Self-pay | Admitting: Physician Assistant

## 2021-08-17 ENCOUNTER — Other Ambulatory Visit: Payer: Self-pay | Admitting: Family Medicine

## 2021-08-17 DIAGNOSIS — K644 Residual hemorrhoidal skin tags: Secondary | ICD-10-CM

## 2021-08-17 DIAGNOSIS — K602 Anal fissure, unspecified: Secondary | ICD-10-CM

## 2021-08-18 ENCOUNTER — Telehealth: Payer: Self-pay | Admitting: Physician Assistant

## 2021-08-18 ENCOUNTER — Ambulatory Visit (INDEPENDENT_AMBULATORY_CARE_PROVIDER_SITE_OTHER): Payer: Medicare Other | Admitting: Psychiatry

## 2021-08-18 DIAGNOSIS — F331 Major depressive disorder, recurrent, moderate: Secondary | ICD-10-CM | POA: Diagnosis not present

## 2021-08-18 DIAGNOSIS — F411 Generalized anxiety disorder: Secondary | ICD-10-CM

## 2021-08-18 DIAGNOSIS — T887XXA Unspecified adverse effect of drug or medicament, initial encounter: Secondary | ICD-10-CM

## 2021-08-18 DIAGNOSIS — F341 Dysthymic disorder: Secondary | ICD-10-CM

## 2021-08-18 DIAGNOSIS — G479 Sleep disorder, unspecified: Secondary | ICD-10-CM

## 2021-08-18 DIAGNOSIS — Z1589 Genetic susceptibility to other disease: Secondary | ICD-10-CM

## 2021-08-18 DIAGNOSIS — G3184 Mild cognitive impairment, so stated: Secondary | ICD-10-CM

## 2021-08-18 DIAGNOSIS — F4010 Social phobia, unspecified: Secondary | ICD-10-CM

## 2021-08-18 MED ORDER — LAMOTRIGINE 100 MG PO TABS: 100.0000 mg | ORAL_TABLET | Freq: Every day | ORAL | 1 refills | Status: DC

## 2021-08-18 NOTE — Telephone Encounter (Signed)
Rx sent 

## 2021-08-18 NOTE — Telephone Encounter (Signed)
I reviewed meds with Claiborne Billings and what he has in the home matches our list.

## 2021-08-18 NOTE — Telephone Encounter (Signed)
Kelly-nurse with home health called to go over meds to make sure Pt taking correct meds.PCP referred to Home health. Please return call @ [phone]

## 2021-08-18 NOTE — Progress Notes (Signed)
Psychotherapy Progress Note [facility] [name], PhD LP  Patient ID: Richard Wilson [facility] [name])    MRN: [id number] Therapy format: Individual psychotherapy Date: 08/18/2021      Start: 1:12p     Stop: 1:56p     Time Spent: 44 min Location: In-person   Session narrative (presenting needs, interim history, self-report of stressors and symptoms, applications of prior therapy, status changes, and interventions made in session) Scheduled on short notice, presenting to the office distressed late this morning.  Home health nurse visited Sunday, and for some reason he asked her to check on the pain referral, but really, now he's just over it, resents his physician for perceived runaround about his condition.  Also claims to be off Lamictal (?), possibly an interrupted supply in the course of last week's inventory and reissue, apparently never picked up renewal at the pharmacy.  Per psychiatrist's advice in session, do not pick up, just simplify and stay off Lamictal until next visit and await further decision, in order to prevent risk of SJS.  Interpreted this to PT, assured that if he is to have withdrawal sxs, he is in or through the worst of it now, and that may be powering his anger.  Honestly, though, he feels like a lifetime failure and would rather die.  No suicidal plans, but quietly and intensely distressed.  Probed concerns, validated layers of worry, pain, and uncertainty, interpreted some of his distress as, actually, abrupt Lamictal withdrawal, with the hope that his brain is actually making its adjustment already, and he can expect his thoughts to become less "noisy" in the coming days.  Continued to encourage to separate issues rather than kitchen-sink them.  Actually, feels it is 40 years ago when he felt like he knew what he was doing in life, not stigmatized for mental health concerns (tells of his 1st breakdown and hospitalization, and his Secondary school teacher  telling him not to waste his time on psychiatry), and when he truly had friends.    Probed concerns with medical service and noted more overwhelming feeling of loneliness at this point, acknowledging how, in the rush of other concerns, we have not mounted a solid effort to put down some roots in this community he only came to because he found affordable housing and maybe felt the city would be friendlier to an older gay man.  Informed of outreach to a gay-knowledgeable primary care physician Jill Alexanders), with offer to see him on 2nd opinion if he is actually at the point of feeling the need.  Grateful, says he has questions to ask that he does not feel he can broach with his current physician.  Encouraged to treat it as a provisional visit, don't make any decision to switch yet, and provided Dr. Lanice Shirts instructions how to make entry.  Re. IADLs, says he is balancing checkbook the past week, working much better now that he is using a calculator.  Therapeutic modalities: Cognitive Behavioral Therapy, Solution-Oriented/Positive Psychology, and Ego-Supportive  Mental Status/Observations:  Appearance:   Casual     Behavior:  Appropriate  Motor:  Normal  Speech/Language:   Clear and Coherent and impressionistic  Affect:  A bit dramatic  Mood:  depressed  Thought process:  tangential  Thought content:    Obsessions  Sensory/Perceptual disturbances:    pain  Orientation:  Fully oriented  Attention:  Fair    Concentration:  Fair  Memory:  grossly intact  Insight:    Fair  Judgment:  Fair  Impulse Control:  Fair   Risk Assessment: Danger to Self: No Self-injurious Behavior: No Danger to Others: No Physical Aggression / Violence: No Duty to Warn: No Access to Firearms a concern: No  Assessment of progress:  situational setback(s)  Diagnosis:   ICD-10-CM   1. Major depressive disorder, recurrent episode, moderate (HCC)  F33.1     2. Generalized anxiety disorder  F41.1     3. Early  onset dysthymia  F34.1     4. Mild cognitive impairment of uncertain or unknown etiology -- suspect combination of nutritional deficiency, anxiety, sedative effects, and dyssomnia with possible apnea  G31.84     5. 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 Heterozygous MTHFR mutation C677T  Z15.89     7. Social anxiety disorder  F40.10     8. Medication side effects (dry mouth)  T88.7XXA      Plan:  Financial organization -- Continue banking/arithmetic measures Medication organization -- Use a standard pillbox to organize meds rather than rely on daily concentration and organization Nutrition -- Prefer Cerefolin to simple B12 based on history and assessment, but understand prohibitive cost.  Still recommend OTC B complex as preferable to B12 only, given indications of broader nutritional issues.  Maintain varied food sources, try to improve protein content, regulate vitamin supplementation, restrain late carbs.  Continue improved vegetable content but go for reduced-sodium if canned juice. Mouth pain problem -- Address with approved use of OTC pain relief, PCP's advice, and see through given referral and feedback to PCP.  Maintain hydration to combat dry mouth and work out suspected salivary gland inflammation.  May need Biotene or a similar remedy if he should expect to contend with medication-induced dry mouth.  (See PCP or psychiatry if connected.) PCP relationship and medical organization -- Work with PCP in good faith to fully inform and ask necessary questions, fully comprehend medical advice, written instructions may help.  Care coordination with psychiatry and PCP ad lib. Other recommendations/advice as may be noted above Continue to utilize previously learned skills ad lib Maintain medication as prescribed and work faithfully with relevant prescriber(s) if any changes are desired or seem indicated Call the clinic on-call service, 988/hotline, 911, or present to [facility] or ER if any life-threatening  psychiatric crisis Return for session(s) already scheduled, but will cancel Thurs. Already scheduled visit in this office 08/31/2021.  Blanchie Serve, PhD [name], PhD LP Clinical Psychologist, [facility] [address] [place], [place] [address] [phone]

## 2021-08-18 NOTE — Telephone Encounter (Addendum)
Pt requesting RF for Lamictal 100 mg 1/d #90 with 1-RF. Looks like it was printed 7/10. Apt 8/8. [place]

## 2021-08-20 ENCOUNTER — Ambulatory Visit: Payer: Medicare Other | Admitting: Psychiatry

## 2021-08-24 NOTE — Telephone Encounter (Signed)
Form for home health was signed and sent to them today. Dm/cma

## 2021-08-25 ENCOUNTER — Ambulatory Visit (INDEPENDENT_AMBULATORY_CARE_PROVIDER_SITE_OTHER): Payer: Medicare Other | Admitting: Psychiatry

## 2021-08-25 ENCOUNTER — Ambulatory Visit (INDEPENDENT_AMBULATORY_CARE_PROVIDER_SITE_OTHER): Payer: Medicare Other | Admitting: Family Medicine

## 2021-08-25 ENCOUNTER — Encounter: Payer: Self-pay | Admitting: Family Medicine

## 2021-08-25 VITALS — BP 116/70 | HR 95 | Temp 97.5°F | Ht 68.5 in | Wt 185.6 lb

## 2021-08-25 DIAGNOSIS — G479 Sleep disorder, unspecified: Secondary | ICD-10-CM

## 2021-08-25 DIAGNOSIS — F99 Mental disorder, not otherwise specified: Secondary | ICD-10-CM

## 2021-08-25 DIAGNOSIS — F331 Major depressive disorder, recurrent, moderate: Secondary | ICD-10-CM | POA: Diagnosis not present

## 2021-08-25 DIAGNOSIS — Z1589 Genetic susceptibility to other disease: Secondary | ICD-10-CM

## 2021-08-25 DIAGNOSIS — M26621 Arthralgia of right temporomandibular joint: Secondary | ICD-10-CM | POA: Diagnosis not present

## 2021-08-25 DIAGNOSIS — F4010 Social phobia, unspecified: Secondary | ICD-10-CM | POA: Diagnosis not present

## 2021-08-25 DIAGNOSIS — T887XXA Unspecified adverse effect of drug or medicament, initial encounter: Secondary | ICD-10-CM

## 2021-08-25 DIAGNOSIS — F5105 Insomnia due to other mental disorder: Secondary | ICD-10-CM

## 2021-08-25 DIAGNOSIS — F341 Dysthymic disorder: Secondary | ICD-10-CM | POA: Diagnosis not present

## 2021-08-25 DIAGNOSIS — G3184 Mild cognitive impairment, so stated: Secondary | ICD-10-CM

## 2021-08-25 DIAGNOSIS — F411 Generalized anxiety disorder: Secondary | ICD-10-CM | POA: Diagnosis not present

## 2021-08-25 NOTE — Progress Notes (Signed)
Psychotherapy Progress Note [facility] Richard Moore, PhD LP  Patient ID: Richard Wilson [facility] [name])    MRN: [id number] Therapy format: Individual psychotherapy Date: 08/25/2021      Start: 1:10p     Stop: 2:00p     Time Spent: 50 min Location: In-person   Session narrative (presenting needs, interim history, self-report of stressors and symptoms, applications of prior therapy, status changes, and interventions made in session) Been particularly depressed, was wondering Sunday what his life will come to.  Did schedule a 2nd opinion with Dr. Redmond School, coming this afternoon.  Not optimistic about it.  Reviewed purposes, reinforced the particular value of a 2nd opinion to feel solid about what to do with jaw/mouth pain and the opportunity -- much-appreciated last week -- to get knowledgeable local insights about living gay and older here.    Sunday went to [place].  Didn't feel so out of place, but did come home tearful, largely for returning home to nothing at home.  Denies warming up to another suicide attempt, but feels himself more and more hopeless.  One concern now is for rent raising in Jan 2024.  Bothered by the informality of worship at [facility].  Reviewed need for community, encouraged again to consider the gay community organization he turned up last fall.  Reviewed various church choices again, resolved to attend [facility] again.  Therapeutic modalities: Cognitive Behavioral Therapy, Solution-Oriented/Positive Psychology, and Ego-Supportive  Mental Status/Observations:  Appearance:   Casual     Behavior:  Appropriate  Motor:  Normal  Speech/Language:   Clear and Coherent and impressionistic  Affect:  Appropriate and a bit dramatic  Mood:  depressed  Thought process:  normal  Thought content:    Rumination  Sensory/Perceptual disturbances:    WNL  Orientation:  Fully oriented  Attention:  Good     Concentration:  Fair  Memory:  grossly intact  Insight:    Good  Judgment:   Fair  Impulse Control:  Fair   Risk Assessment: Danger to Self:  thoughts of death, no SI  Self-injurious Behavior: No Danger to Others: No Physical Aggression / Violence: No Duty to Warn: No Access to Firearms a concern: No  Assessment of progress:  stabilized  Diagnosis:   ICD-10-CM   1. Major depressive disorder, recurrent episode, moderate (HCC)  F33.1     2. 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 Social anxiety disorder  F40.10     4. Generalized anxiety disorder  F41.1     5. Insomnia due to other mental disorder  F51.05    F99     6. Mild cognitive impairment of uncertain or unknown etiology -- suspect combination of nutritional deficiency, anxiety, sedative effects, and dyssomnia with possible apnea  G31.84     7. 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 Heterozygous MTHFR mutation C677T  Z15.89     9. Medication side effects (dry mouth)  T88.7XXA      Plan:  Social connections and morale -- Encourage check further with gay organization found last fall, attend church of choice and continue to assess for possible fit, resists obsessive sense of responsibility to "work" or "contribute" off the Presenter, broadcasting -- Continue banking/arithmetic measures Medication organization -- Use a standard pillbox to organize meds rather than rely on daily concentration and organization Nutrition -- Prefer Cerefolin to simple B12 based on history and assessment, but understand  prohibitive cost.  Still recommend OTC B complex as preferable to B12 only, given indications of broader nutritional issues.  Maintain varied food sources, try to improve protein content, regulate vitamin supplementation, restrain late carbs.  Continue improved vegetable content but go for reduced-sodium if canned juice. Mouth pain problem -- Address with approved use of OTC pain relief, PCP's advice, and see through given referral and feedback  to PCP.  Maintain hydration to combat dry mouth and work out suspected salivary gland inflammation.  May need Biotene or a similar remedy if he should expect to contend with medication-induced dry mouth.  (See PCP or psychiatry if connected.) PCP relationship and medical organization -- Work with PCP in good faith to fully inform and ask necessary questions, fully comprehend medical advice, written instructions may help.  Care coordination with psychiatry and PCP ad lib.Other recommendations/advice as may be noted above Continue to utilize previously learned skills ad lib Maintain medication as prescribed and work faithfully with relevant prescriber(s) if any changes are desired or seem indicated Call the clinic on-call service, 988/hotline, 911, or present to [facility] or ER if any life-threatening psychiatric crisis Return in about 1 week (around 09/01/2021) for session(s) already scheduled. Already scheduled visit in this office 08/31/2021.  Blanchie Serve, PhD Richard Moore, PhD LP Clinical Psychologist, [facility] [address] [place], [place] [address] [phone]

## 2021-08-25 NOTE — Patient Instructions (Signed)
Call [phone] ask about the gait and gray organization

## 2021-08-25 NOTE — Progress Notes (Signed)
   Subjective:    Patient ID: Richard Wilson, male    DOB: 1950-07-09, 71 y.o.   MRN: [id number]  HPI He is here to get established as a new patient.  He has had difficulty recently with TMJ pain.  He was apparently referred to ENT however they did not send him.  He does state that now the TMJ pain in his almost completely diminished.  He is involved in counseling with a therapist.  He is dealing with a lot of issues in his life 1 of which is his sexuality.  He is becoming more more comfortable with that but still not where he wants to be.   Review of Systems     Objective:   Physical Exam Alert and in no distress.  Exam of the right TMJ shows no palpable tenderness.  No crepitus noted.       Assessment & Plan:  Arthralgia of right temporomandibular joint Recommend conservative care with Tylenol as well as eating softer foods.  If need be we can refer to oral surgeon if needed. Then discussed his life and lifestyle explained that this is not an issue here.  We will be happy to take care of him.  Did recommend he set up for complete exam when it is convenient.  Also gave him the phone number for the [employer] to contact him concerning an organization called [employer] and [employer].

## 2021-08-27 ENCOUNTER — Other Ambulatory Visit: Payer: Self-pay | Admitting: Physician Assistant

## 2021-08-31 ENCOUNTER — Ambulatory Visit (INDEPENDENT_AMBULATORY_CARE_PROVIDER_SITE_OTHER): Payer: Medicare Other | Admitting: Psychiatry

## 2021-08-31 ENCOUNTER — Encounter: Payer: Medicare Other | Admitting: Psychology

## 2021-08-31 DIAGNOSIS — F4010 Social phobia, unspecified: Secondary | ICD-10-CM | POA: Diagnosis not present

## 2021-08-31 DIAGNOSIS — F331 Major depressive disorder, recurrent, moderate: Secondary | ICD-10-CM | POA: Diagnosis not present

## 2021-08-31 DIAGNOSIS — F428 Other obsessive-compulsive disorder: Secondary | ICD-10-CM

## 2021-08-31 DIAGNOSIS — F341 Dysthymic disorder: Secondary | ICD-10-CM | POA: Diagnosis not present

## 2021-08-31 DIAGNOSIS — F411 Generalized anxiety disorder: Secondary | ICD-10-CM

## 2021-08-31 DIAGNOSIS — G3184 Mild cognitive impairment, so stated: Secondary | ICD-10-CM

## 2021-08-31 DIAGNOSIS — G479 Sleep disorder, unspecified: Secondary | ICD-10-CM

## 2021-08-31 DIAGNOSIS — Z1589 Genetic susceptibility to other disease: Secondary | ICD-10-CM

## 2021-08-31 NOTE — Progress Notes (Signed)
Psychotherapy Progress Note [facility] Richard Moore, PhD LP  Patient ID: Richard Wilson [name])    MRN: [id number] Therapy format: Individual psychotherapy Date: 08/31/2021      Start: 11:18a     Stop: 12:08p     Time Spent: 50 min Location: In-person   Session narrative (presenting needs, interim history, self-report of stressors and symptoms, applications of prior therapy, status changes, and interventions made in session) Tends to dread the days.  Woke up crying today, feeling useless, life pointless.  Unable to point to particular triggers or issues exacerbating, just generally off.  Not interested in social outlets.  Has been 2nd time to [facility], but really, it's too small and too informal for his tastes.  Not sure he means to be involved in church at all any more.  Reminded he is free to choose, just make sure neither attending nor nonattending have to do with perceived obligations and social expectations, but more clearly about what he wants, and what works, including Scientist, product/process development, meditative experience, and possible spiritual benefits.  Expenses mounting with dental visit and car insurance renewal.  Rent going up $75 in October now, he says (last week Jan. 2024).  Annual income just short of $24k (SSD + annuity + interest), does not feel he will be able to manage.  Queried his annuity, seems ill-informed about it, says it was set for an amount that should last him the rest of his life, but unclear whether that means a fixed payment that will run out at an expected age or a payment that is expected to never touch principal, even late in life.  Encouraged to visit his bank, ask for a banker's orientation and help deciding whether to increase withdrawals.  Will not solve his depression, but it could ease a set of worries to find out he actually has the means to absorb increased cost of living.  Briefly discussed possibility of finding a more collegial  senior community, most likely not a progressive care community (far too expensive), but apartment or duplex dwellers who befriend and look out for each other.  Can still buy things compulsively despite worrying about money.  Says he did go to see Richard Wilson, but it was "nothing much, just 15 minutes", only discussed his mouth pain, which isn't happening any more anyway.  (Point was to get clarity what it is and how to mange, regardless of status.)  Didn't really do anything else, didn't ask any of his questions about living gay and older in [place].  Asked about trying to deal with loneliness somehow, find roots somehow, said he could try to get in touch with Richard Wilson but wouldn't know why, since he is no longer interested in dating.  Reminded there is much more to social connections than dating, he has complained of not even having friends, really, and it would be self-sabotage to prevent himself from even finding out what is available.  Tepid agreement to reach out to them and find out more.  Check of EHR after session reveals he did get treatment advice on mouth pain (assessed arthralgia, rec'd conservative treatment with Tylenol and softer foods if needed, potential referral to oral surgeon, and they did in fact talk about lifestyle and connections, including providing phone number to Richard Wilson and word about their Richard Wilson and Richard Wilson group.    Therapeutic modalities: Cognitive Behavioral Therapy and Solution-Oriented/Positive Psychology  Mental Status/Observations:  Appearance:   Casual  Behavior:  Resistant  Motor:  Normal  Speech/Language:   Clear and Coherent and impressionistic  Affect:  Appropriate  Mood:  depressed  Thought process:  normal  Thought content:    Rumination and hopelessness  Sensory/Perceptual disturbances:    grossly intact  Orientation:  Fully oriented  Attention:  Good    Concentration:  Good  Memory:  Seemingly intact, but lacking and distorted   Insight:    Fair  Judgment:   Good  Impulse Control:  Fair   Risk Assessment: Danger to Self: No Self-injurious Behavior: No Danger to Others: No Physical Aggression / Violence: No Duty to Warn: No Access to Firearms a concern: No  Assessment of progress:  stabilized  Diagnosis:   ICD-10-CM   1. Major depressive disorder, recurrent episode, moderate (HCC)  F33.1     2. 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 Social anxiety disorder  F40.10     4. Generalized anxiety disorder  F41.1     5. Mild cognitive impairment of uncertain or unknown etiology -- suspect combination of nutritional deficiency, anxiety, sedative effects, and dyssomnia with possible apnea  G31.84     6. 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 Heterozygous MTHFR mutation C677T  Z15.89     8. Compulsive shopping  F42.8      Plan:  Social connections and morale -- Check with Richard Wilson.  Options about church attendance, just stay clear that is for curiosity and assessment, social opportunity, not compulsive sense of duty. Financial organization -- Continue banking/arithmetic measures.  If not yet, should approach bank about his annuity, may be able to safely increase withdrawals ahead of expected cost hikes. Medication organization -- Use a standard pillbox to organize meds rather than rely on daily concentration and organization Nutrition -- Prefer Cerefolin to simple B12 based on history and assessment, but understand prohibitive cost.  Still recommend OTC B complex as preferable to B12 only, given indications of broader nutritional issues.  Maintain varied food sources, try to improve protein content, regulate vitamin supplementation, restrain late carbs.  Continue improved vegetable content but go for reduced-sodium if canned juice. Mouth pain problem -- Address with approved use of OTC pain relief, PCP's advice, and see through given referral and feedback to PCP.  Maintain hydration to combat dry mouth and work out suspected  salivary gland inflammation.  May need Biotene or a similar remedy if he should expect to contend with medication-induced dry mouth.  (See PCP or psychiatry if connected.) PCP relationship and medical organization -- Work with PCP in good faith to fully inform and ask necessary questions, fully comprehend medical advice, written instructions may help.  Care coordination with psychiatry and PCP ad lib. Other recommendations/advice as may be noted above Continue to utilize previously learned skills ad lib Maintain medication as prescribed and work faithfully with relevant prescriber(s) if any changes are desired or seem indicated Call the clinic on-call service, 988/hotline, 911, or present to [facility] or ER if any life-threatening psychiatric crisis Return for session(s) already scheduled. Already scheduled visit in this office 09/08/2021.  Blanchie Serve, PhD Richard Moore, PhD LP Clinical Psychologist, [facility] [address] [place], [place] [address] [phone]

## 2021-09-03 ENCOUNTER — Ambulatory Visit (INDEPENDENT_AMBULATORY_CARE_PROVIDER_SITE_OTHER): Payer: Medicare Other | Admitting: Medical

## 2021-09-03 VITALS — BP 120/80 | HR 105 | Temp 97.9°F | Wt 185.2 lb

## 2021-09-03 DIAGNOSIS — M79641 Pain in right hand: Secondary | ICD-10-CM

## 2021-09-03 DIAGNOSIS — S0300XA Dislocation of jaw, unspecified side, initial encounter: Secondary | ICD-10-CM

## 2021-09-03 MED ORDER — TIZANIDINE HCL 4 MG PO TABS: 4.0000 mg | ORAL_TABLET | Freq: Two times a day (BID) | ORAL | 0 refills | Status: DC | PRN

## 2021-09-03 NOTE — Progress Notes (Signed)
Subjective: Chief Complaint  Patient presents with   right hand pain    Right hand pain at the index finger and thumb x 3 months. Hurts to move or grab things     Jaw Pain    Having jaw pain on right side. Was told [facility] didn't see patients with that issues- hurts to eat x 3 months   Here for 2 issues.  He notes both issues been going on for the last few months.  Having some pains in his right hand around the thumb and index finger.  It is becoming difficult to turn a jar, pain picking up things.  Pain with thumb opposing of the fingers.  No trauma or injury.  No swelling.  Thinks it is arthritis.  He has not tried any particular treatment.  He does get tingly sometimes specifically over the base of the thumb and index finger but no other numbness or tingling.  No wrist pain or numbness.  No history of carpal tunnel syndrome.  He is right-handed.  He does not do a lot of work with his hands so he does not really think carpal tunnel issues.  He also has some right jaw pain.  He has a bridge and he favors eating on the right side which may be contributing to the issue.  No other injury or trauma.  He has not tried any specific treatment.  It does hurt to eat and swallow.  No ear pain specifically.  No congestion, allergy symptoms or sore throat.  No other aggravating or relieving factors. No other complaint.  Past Medical History:  Diagnosis Date   Arthritis of knee 08/31/2017   Chronic idiopathic constipation 10/23/2020   Chronic kidney disease, stage 3a 41/74/0814   Diastolic dysfunction 48/18/5631   Diverticulosis of large intestine without hemorrhage 09/30/2013   Elevated PSA 12/22/2012   Erectile dysfunction 10/23/2020   Generalized anxiety disorder 01/01/2020   Hyperglycemia 09/24/2020   Intention tremor    Major depressive disorder 01/01/2020   Mild cognitive impairment of uncertain or unknown etiology 01/21/2021   Mild mitral regurgitation 08/15/2013    Nephrolithiasis 06/22/2016   Obstructive sleep apnea 04/13/2015   Orthostatic lightheadedness 10/23/2020   Primary osteoarthritis of both hips 07/30/2019   Pure hypercholesterolemia 12/15/2018   RBBB (right bundle branch block) 12/03/2012   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   Thrombocytopenia 04/08/2020   Current Outpatient Medications on File Prior to Visit  Medication Sig Dispense Refill   amitriptyline (ELAVIL) 25 MG tablet Take 1 tablet (25 mg total) by mouth at bedtime. 90 tablet 0   clonazePAM (KLONOPIN) 1 MG tablet Take 1 tablet (1 mg total) by mouth 3 (three) times daily as needed for anxiety. Hold until he requests, he will need filled earlier than expected b/c we increased dose to tid today, 04/22/2021. 90 tablet 1   fluvoxaMINE (LUVOX) 100 MG tablet Take 100 mg by mouth 2 (two) times daily.     hydrocortisone 2.5 % ointment Apply topically daily. Use once a day on eyebrows- dermatitis     lamoTRIgine (LAMICTAL) 100 MG tablet Take 1 tablet (100 mg total) by mouth daily. 90 tablet 1   Probiotic Product (PROBIOTIC-10) CHEW Chew by mouth.     psyllium (METAMUCIL) 58.6 % powder Take 1 packet by mouth in the morning and at bedtime.     sildenafil (VIAGRA) 100 MG tablet Take 1 tablet (100 mg total) by mouth daily as needed for erectile dysfunction. 10 tablet 0  vitamin B-12 (CYANOCOBALAMIN) 1000 MCG tablet Take 1,000 mcg by mouth daily.     No current facility-administered medications on file prior to visit.    ROS as in subjective    Objective: General: Well developed-nourished no acute distress Right hand with tenderness over the base of the thumb, otherwise hand nontender, there is some pain with thumb opposing of the fingers.  He does not have some pain with gripping.  There are some mild arthritic changes noted of the PIPs and DIPs.  Otherwise hand nontender with normal range of motion.  No swelling.  Arms otherwise unremarkable Arms fingers and hands neurovascularly  intact, normal cap refill strength and sensation, negative Phalen's and Tinel's Tender over right TMJ.  Otherwise nontender, no obvious deformity, no tooth abnormality, teeth in good repair, HEENT otherwise unremarkable.       Assessment: Encounter Diagnoses  Name Primary?   Right hand pain Yes   Dislocation of temporomandibular joint, initial encounter     Plan: Discussed symptoms and concerns   Problem 1: hand pain, likely arthritis Your symptoms and exam suggest some arthritis of the right hand, or possibly even some tendinitis  Recommendations: 1-over the weekend consider doing some cold therapy/ice water therapy such as bag of frozen peas or bag of ice water.  Uses on the thumb and index finger 20 minutes at a time.  Use a cloth between the ice and your hand.  I recommend doing the ice therapy several times over the weekend 2-you can use some over-the-counter Aleve twice daily for the next 4 to 5 days for pain and inflammation for both the hand and the TMJ.  We do not want to use this ongoing or any higher doses given that you do have some underlying chronic kidney disease.  However short-term this will be okay 3-as an alternate to Aleve, you can stick with Tylenol over the weekend.  You can take Tylenol every 6 hours as needed for pain.  I would stick with the 500 mg dose or less. 4-another alternative is topical creams such as Aspercreme or capsaicin cream to the hand 5-I recommend you use a thumb spica splint for the next 5 days to help rest the thumb began.  After 5 days use this as needed.  You can purchase this type of splint on [place], possibly local pharmacy or medical supply store    If you start having fairly chronic consistent pain you may need an seen orthopedics  Go for baseline x-ray of your hand   Please go to [facility] for your right hand xray.   Their hours are 8am - 4:30 pm Monday - Friday.  Take your insurance card with you.  [facility] [phone]  [place] [address], [place], [place] [address]  [address], [place] [address]   Problem 2: TMJ dysfunction Recommendations 1-use the same pain medication recommendations as in problem 1 above (Tylenol or Aleve).  You can also use Tizanidine muscle relaxer once or twice daily as needed short term to relax the muscle tension in your jaw.  This is a short term medication.  It can cause drowsiness 2-avoid lots of chewing such as chewing gum or tough portions of meat such as beef 3-May be switched to chewing on the left for the time being 4-you can use cool food, ice water pack to your TMJ area 20 minutes at a time  If this continues to be an issue we sometimes refer to  ENT   F/u prn

## 2021-09-03 NOTE — Patient Instructions (Signed)
Encounter Diagnoses  Name Primary?   Right hand pain Yes   Dislocation of temporomandibular joint, initial encounter     Problem 1: hand pain, likely arthritis Your symptoms and exam suggest some arthritis of the right hand, or possibly even some tendinitis  Recommendations: 1-over the weekend consider doing some cold therapy/ice water therapy such as bag of frozen peas or bag of ice water.  Uses on the thumb and index finger 20 minutes at a time.  Use a cloth between the ice and your hand.  I recommend doing the ice therapy several times over the weekend 2-you can use some over-the-counter Aleve twice daily for the next 4 to 5 days for pain and inflammation for both the hand and the TMJ.  We do not want to use this ongoing or any higher doses given that you do have some underlying chronic kidney disease.  However short-term this will be okay 3-as an alternate to Aleve, you can stick with Tylenol over the weekend.  You can take Tylenol every 6 hours as needed for pain.  I would stick with the 500 mg dose or less. 4-another alternative is topical creams such as Aspercreme or capsaicin cream to the hand 5-I recommend you use a thumb spica splint for the next 5 days to help rest the thumb began.  After 5 days use this as needed.  You can purchase this type of splint on [place], possibly local pharmacy or medical supply store    If you start having fairly chronic consistent pain you may need an seen orthopedics  Go for baseline x-ray of your hand   Please go to [facility] for your right hand xray.   Their hours are 8am - 4:30 pm Monday - Friday.  Take your insurance card with you.  [facility] [phone]  [address], [place], [place] [address]  [address], [place] [address]   Problem 2: TMJ dysfunction Recommendations 1-use the same pain medication recommendations as in problem 1 above (Tylenol or Aleve).  You can also use Tizanidine muscle  relaxer once or twice daily as needed short term to relax the muscle tension in your jaw.  This is a short term medication.  It can cause drowsiness 2-avoid lots of chewing such as chewing gum or tough portions of meat such as beef 3-May be switched to chewing on the left for the time being 4-you can use cool food, ice water pack to your TMJ area 20 minutes at a time  If this continues to be an issue we sometimes refer to ENT   Temporomandibular Joint Syndrome  Temporomandibular joint syndrome (TMJ syndrome) is a condition that causes pain in the temporomandibular joints. These joints are located near your ears and allow your jaw to open and close. For people with TMJ syndrome, chewing, biting, or other movements of the jaw can be difficult or painful. TMJ syndrome is often mild and goes away within a few weeks. However, sometimes the condition becomes a long-term (chronic) problem. What are the causes? This condition may be caused by: Grinding your teeth or clenching your jaw. Some people do this when they are stressed. Arthritis. An injury to the jaw. A head or neck injury. Teeth or dentures that are not aligned well. In some cases, the cause of TMJ syndrome may not be known. What are the signs or symptoms? The most common symptom of this condition is aching pain on the side of the head  in the area of the TMJ. Other symptoms may include: Pain when moving your jaw, such as when chewing or biting. Not being able to open your jaw all the way. Making a clicking sound when you open your mouth. Headache. Earache. Neck or shoulder pain. How is this diagnosed? This condition may be diagnosed based on: Your symptoms and medical history. A physical exam. Your health care provider may check the range of motion of your jaw. Imaging tests, such as X-rays or an MRI. You may also need to see your dentist, who will check if your teeth and jaw are lined up correctly. How is this treated? TMJ  syndrome often goes away on its own. If treatment is needed, it may include: Eating soft foods and applying ice or heat. Medicines to relieve pain or inflammation. Medicines or massage to relax the muscles. A splint, bite plate, or mouthpiece to prevent teeth grinding or jaw clenching. Relaxation techniques or counseling to help reduce stress. A therapy for pain in which an electrical current is applied to the nerves through the skin (transcutaneous electrical nerve stimulation). Acupuncture. This may help to relieve pain. Jaw surgery. This is rarely needed. Follow these instructions at home:  Eating and drinking Eat a soft diet if you are having trouble chewing. Avoid foods that require a lot of chewing. Do not chew gum. General instructions Take over-the-counter and prescription medicines only as told by your health care provider. If directed, put ice on the painful area. To do this: Put ice in a plastic bag. Place a towel between your skin and the bag. Leave the ice on for 20 minutes, 2-3 times a day. Remove the ice if your skin turns bright red. This is very important. If you cannot feel pain, heat, or cold, you have a greater risk of damage to the area. Apply a warm, wet cloth (warm compress) to the painful area as told. Massage your jaw area and do any jaw stretching exercises as told by your health care provider. If you were given a splint, bite plate, or mouthpiece, wear it as told by your health care provider. Keep all follow-up visits. This is important. Where to find more information [place]: [email or web address]-[email or web address]/ Contact a health care provider if: You have trouble eating. You have new or worsening symptoms. Get help right away if: Your jaw locks. Summary Temporomandibular joint syndrome (TMJ syndrome) is a condition that causes pain in the temporomandibular joints. These joints are located near your ears and allow your jaw to  open and close. TMJ syndrome is often mild and goes away within a few weeks. However, sometimes the condition becomes a long-term (chronic) problem. Symptoms include an aching pain on the side of the head in the area of the TMJ, pain when chewing or biting, and being unable to open your jaw all the way. You may also make a clicking sound when you open your mouth. TMJ syndrome often goes away on its own. If treatment is needed, it may include medicines to relieve pain, reduce inflammation, or relax the muscles. A splint, bite plate, or mouthpiece may also be used to prevent teeth grinding or jaw clenching. This information is not intended to replace advice given to you by your health care provider. Make sure you discuss any questions you have with your health care provider. Document Revised: 08/31/2020 Document Reviewed: 08/31/2020 Elsevier Patient Education  [place].

## 2021-09-07 ENCOUNTER — Ambulatory Visit (INDEPENDENT_AMBULATORY_CARE_PROVIDER_SITE_OTHER): Payer: Medicare Other | Admitting: Family Medicine

## 2021-09-07 ENCOUNTER — Other Ambulatory Visit: Payer: Self-pay | Admitting: Family Medicine

## 2021-09-07 ENCOUNTER — Ambulatory Visit
Admission: RE | Admit: 2021-09-07 | Discharge: 2021-09-07 | Disposition: A | Discharge status: Home / Self Care | Payer: Medicare Other | Source: Ambulatory Visit | Attending: Medical | Admitting: Medical

## 2021-09-07 ENCOUNTER — Ambulatory Visit
Admission: RE | Admit: 2021-09-07 | Discharge: 2021-09-07 | Disposition: A | Discharge status: Home / Self Care | Payer: Medicare Other | Source: Ambulatory Visit | Attending: Family Medicine | Admitting: Family Medicine

## 2021-09-07 ENCOUNTER — Encounter: Payer: Medicare Other | Admitting: Psychology

## 2021-09-07 ENCOUNTER — Encounter: Payer: Self-pay | Admitting: Family Medicine

## 2021-09-07 VITALS — BP 120/80 | HR 111 | Wt 188.2 lb

## 2021-09-07 DIAGNOSIS — M26621 Arthralgia of right temporomandibular joint: Secondary | ICD-10-CM

## 2021-09-07 DIAGNOSIS — M79641 Pain in right hand: Secondary | ICD-10-CM

## 2021-09-07 MED ORDER — MELOXICAM 7.5 MG PO TABS: 7.5000 mg | ORAL_TABLET | Freq: Every day | ORAL | 0 refills | Status: DC

## 2021-09-07 NOTE — Progress Notes (Signed)
Chief Complaint  Patient presents with   Acute Visit    Right jaw pain since June. It hurts to swallow and chew.   He saw Dr. Redmond School 08/25/21 as new pt with complaint of R TMJ. Notes report that pain had improved some by time of his visit. He recommended Tylenol, softer foods. Refer to oral surgeon if needed.  He reports ongoing pain, with pain currently 7/10. Denies clicking/popping, locking. Has been having pain since May, no long-standing issues.  He continues to have pain at R TMJ, pain with opening mouth, swallowing, hurts to swish water around in mouth (when rinsing after brushing teeth). He denies clenching/grinding his teeth.  Doesn't waking up with headaches. He denies any trauma or injury.  Denies contraindications to NSAIDs, hasn't tried for this pain. Has tolerated OTC NSAIDs without issues in the past.   PMH, PSH, SH reviewed  Outpatient Encounter Medications as of 09/07/2021  Medication Sig   amitriptyline (ELAVIL) 25 MG tablet Take 1 tablet (25 mg total) by mouth at bedtime.   clonazePAM (KLONOPIN) 1 MG tablet Take 1 tablet (1 mg total) by mouth 3 (three) times daily as needed for anxiety. Hold until he requests, he will need filled earlier than expected b/c we increased dose to tid today, 04/22/2021.   fluvoxaMINE (LUVOX) 100 MG tablet Take 100 mg by mouth 2 (two) times daily.   hydrocortisone 2.5 % ointment Apply topically daily. Use once a day on eyebrows- dermatitis   lamoTRIgine (LAMICTAL) 100 MG tablet Take 1 tablet (100 mg total) by mouth daily.   Probiotic Product (PROBIOTIC-10) CHEW Chew by mouth.   psyllium (METAMUCIL) 58.6 % powder Take 1 packet by mouth in the morning and at bedtime.   sildenafil (VIAGRA) 100 MG tablet Take 1 tablet (100 mg total) by mouth daily as needed for erectile dysfunction.   vitamin B-12 (CYANOCOBALAMIN) 1000 MCG tablet Take 1,000 mcg by mouth daily.   tiZANidine (ZANAFLEX) 4 MG tablet Take 1 tablet (4 mg total) by mouth 2 (two) times  daily as needed for muscle spasms. (Patient not taking: Reported on 09/07/2021)   No facility-administered encounter medications on file as of 09/07/2021.   Allergies  Allergen Reactions   Garamycin [Gentamicin] Other (See Comments)    Eyes itched and were edematous   Penicillins     Pt reports is childhood allergy and does not remember the reaction   ROS: no fever, chills, URI symptoms, sinus or teeth pain.  See HPI   PHYSICAL EXAM:  BP 120/80   Pulse (!) 111   Wt 188 lb 3.2 oz (85.4 kg)   SpO2 96%   BMI 28.20 kg/m   Well-appearing, pleasant male, who is in no acute distress HEENT: conjunctiva and sclera are clear, EOMI Tender at R TMJ, extending anteriorly into muscle (masseter). No clicking/popping of the TMJ with movement. No locking. Nontender over temporalis muscles. R TM and EAC normal. OP is clear. Neck: no lymphadenopathy, thyromegaly or mass   ASSESSMENT/PLAN:  Arthralgia of right temporomandibular joint - check x-ray to eval for degenerative changes. Poss muscular component. Trial NSAIDs, risks/SE reviewed. Refer to oral surgeon per request. Consider PT - Plan: meloxicam (MOBIC) 7.5 MG tablet, Ambulatory referral to Oral Maxillofacial Surgery, CANCELED: DG TMJ Open & Close Unilateral Right   XR R TMJ Trial of NSAID Refer to oral surgeon per pt request If pain not significantly improved with NSAID, can refer to PT (vs waiting for oral surgery eval, if not too far out).

## 2021-09-07 NOTE — Patient Instructions (Addendum)
Go to [facility] for x-rays of the right TMJ. You can go to either [address] [place].  Take the meloxicam one pill with food every day until your pain has resolved (you can have some leftover if your pain is better before 15 days). Do NOT take any ibuprofen/motrin/advil/aleve/naproxen while taking this prescription. You MAY take Tylenol (acetaminophen) along with this medication if you need additional pain relief. You may want to try ice or heat to see if either of these help with your jaw pain.  Avoid opening mouth widely (cut your apples), and use softer foods when you are in pain.  Try the exercises on the handout.  Stop them if they make it worse. We are referring you to oral surgery for further evaluation per your request.  I'm going to hold off on the physical therapy to see if the anti-inflammatory medication and the home exercises help.  If it doesn't, just call us or send a MyChart message to let us know that you'd like the referral and we can put it through. Or you can wait and see what the oral surgeons have to say, depending on how long it will take for you to see them.

## 2021-09-08 ENCOUNTER — Ambulatory Visit (INDEPENDENT_AMBULATORY_CARE_PROVIDER_SITE_OTHER): Payer: Medicare Other | Admitting: Physician Assistant

## 2021-09-08 ENCOUNTER — Encounter: Payer: Self-pay | Admitting: Physician Assistant

## 2021-09-08 DIAGNOSIS — F99 Mental disorder, not otherwise specified: Secondary | ICD-10-CM | POA: Diagnosis not present

## 2021-09-08 DIAGNOSIS — F5105 Insomnia due to other mental disorder: Secondary | ICD-10-CM | POA: Diagnosis not present

## 2021-09-08 DIAGNOSIS — F411 Generalized anxiety disorder: Secondary | ICD-10-CM | POA: Diagnosis not present

## 2021-09-08 DIAGNOSIS — F3341 Major depressive disorder, recurrent, in partial remission: Secondary | ICD-10-CM

## 2021-09-08 MED ORDER — CLONAZEPAM 1 MG PO TABS: 1.0000 mg | ORAL_TABLET | Freq: Three times a day (TID) | ORAL | 2 refills | Status: DC | PRN

## 2021-09-08 MED ORDER — FLUVOXAMINE MALEATE 100 MG PO TABS: 100.0000 mg | ORAL_TABLET | Freq: Two times a day (BID) | ORAL | 2 refills | Status: DC

## 2021-09-08 NOTE — Progress Notes (Signed)
[facility] Med Check  Patient ID: Richard Wilson,  MRN: [id number]  PCP: Denita Lung, MD  Date of Evaluation: 09/08/2021 Tme spent:20 minutes  Chief Complaint:  Chief Complaint   Anxiety; Depression; Insomnia; Follow-up    HISTORY/CURRENT STATUS: HPI For routine med check.  Richard Wilson states he is doing pretty well.  "I think we are doing the best we can with her medications.  I do not want to change anything.  I know there is no happy pill."  States he is able to enjoy things, he is reading off and on.  Energy and motivation are fair to good depending on the day.  Appetite has increased and he has gained some weight.  ADLs and personal hygiene are normal.  States he is sleeping well with the amitriptyline.  Still has anxiety, if he does not take the Klonopin it is much worse.  He has been on Klonopin for several decades, we have tried to decrease the dose with the intent of weaning off and he did not tolerate it well so we have left it at the same most recently.  No suicidal or homicidal thoughts.  Patient denies increased energy with decreased need for sleep, increased talkativeness, racing thoughts, impulsivity or risky behaviors, increased spending, increased libido, grandiosity, increased irritability or anger, paranoia, and no hallucinations.  Still has a mild tremor in hands bilaterally, but tolerable.  Worse when he is holding something or reaching for something.  It does not affect his writing or ability to eat.  Denies dizziness, syncope, seizures, numbness, tingling, tics, unsteady gait, slurred speech, confusion. Denies muscle or joint pain, stiffness, or dystonia.  Individual Medical History/ Review of Systems: Changes? :Yes    still having a right TMJ pain.  Saw his PCP yesterday, had x-rays.  Results pending.  Started on meloxicam and feels better this morning.  Past medications for mental health diagnoses include: Rozerem, Trazodone caused vivid dreams, Xanax, Buspar,  mirtazapine caused excessive somnolence and fatigue with flulike symptoms, Belsomra, propranolol for tremor, Zoloft, Abilify caused tremor, Klonopin, Effexor, Ativan, Wellbutrin, Nardil, gabapentin, Librium, Luvox, Amitriptyline   Pertinent info after reviewing Dr. Evelena Leyden notes, his previous psychiatrist in [facility]. Past suicide attempt in September 1996, by taking Tylenol.  He was admitted at that time to a psychiatric hospital in [facility]  Admitted to psych hospital multiple times in the past for "nervous breakdowns."  January 1985, February 1986, October 1991, September 1996, July 2000.  Allergies: Garamycin [gentamicin] and Penicillins  Current Medications:  Current Outpatient Medications:    amitriptyline (ELAVIL) 25 MG tablet, Take 1 tablet (25 mg total) by mouth at bedtime., Disp: 90 tablet, Rfl: 0   hydrocortisone 2.5 % ointment, Apply topically daily. Use once a day on eyebrows- dermatitis, Disp: , Rfl:    lamoTRIgine (LAMICTAL) 100 MG tablet, Take 1 tablet (100 mg total) by mouth daily., Disp: 90 tablet, Rfl: 1   meloxicam (MOBIC) 7.5 MG tablet, Take 1 tablet (7.5 mg total) by mouth daily. Take until pain has resolved.  Take with food, Disp: 15 tablet, Rfl: 0   Probiotic Product (PROBIOTIC-10) CHEW, Chew by mouth., Disp: , Rfl:    psyllium (METAMUCIL) 58.6 % powder, Take 1 packet by mouth in the morning and at bedtime., Disp: , Rfl:    sildenafil (VIAGRA) 100 MG tablet, Take 1 tablet (100 mg total) by mouth daily as needed for erectile dysfunction., Disp: 10 tablet, Rfl: 0   vitamin B-12 (CYANOCOBALAMIN) 1000 MCG  tablet, Take 1,000 mcg by mouth daily., Disp: , Rfl:    clonazePAM (KLONOPIN) 1 MG tablet, Take 1 tablet (1 mg total) by mouth 3 (three) times daily as needed for anxiety., Disp: 90 tablet, Rfl: 2   fluvoxaMINE (LUVOX) 100 MG tablet, Take 1 tablet (100 mg total) by mouth 2 (two) times daily., Disp: 60 tablet, Rfl: 2   tiZANidine  (ZANAFLEX) 4 MG tablet, Take 1 tablet (4 mg total) by mouth 2 (two) times daily as needed for muscle spasms. (Patient not taking: Reported on 09/08/2021), Disp: 14 tablet, Rfl: 0 Medication Side Effects: sexual dysfunction   Family Medical/ Social History: Changes?  no  MENTAL HEALTH EXAM:  There were no vitals taken for this visit.There is no height or weight on file to calculate BMI.  General Appearance: Casual, Guarded, and Well Groomed  Eye Contact:  Good  Speech:  Clear and Coherent and Normal Rate  Volume:  Normal  Mood:  Euthymic  Affect:  Congruent  Thought Process:  Goal Directed and Descriptions of Associations: Circumstantial  Orientation:  Full (Time, Place, and Person)  Thought Content: Logical   Suicidal Thoughts:  No  Homicidal Thoughts:  No  Memory:  WNL  Judgement:  Good  Insight:  Good  Psychomotor Activity:  Normal and no tremor present at this time.  Concentration:  Concentration: Good and Attention Span: Good  Recall:  Good  Fund of Knowledge: Good  Language: Good  Assets:  Desire for Improvement Financial Resources/Insurance Housing Transportation  ADL's:  Intact  Cognition: WNL  Prognosis:  Good   Gene site test results are on chart under media. See neuro psych test results on chart.[name] [name] 01/21/2020  Note from Sharene Butters, PA-C from 07/21/2021 reviewed.  DIAGNOSES:    ICD-10-CM   1. Recurrent major depression in partial remission (HCC)  F33.41     2. Generalized anxiety disorder  F41.1     3. Insomnia due to other mental disorder  F51.05    F99        Receiving Psychotherapy: Yes    Dr. Jonni Sanger [place].   RECOMMENDATIONS:  PDMP was reviewed.  Klonopin last filled 08/17/2021. I provided 20 minutes of face to face time during this encounter, including time spent before and after the visit in records review, medical decision making, counseling pertinent to today's visit, and charting.   Richard Wilson brought his medications today for my  review.  Is taking all meds properly as per med list.  We discussed the medications and his response to them.  He is sleeping much better and anxiety and depression are pretty controlled.  He does not want to change any medications at this time since he is stable.  He continues to "work on" himself in counseling and doing things he enjoys to also help his mood.  Continue amitriptyline 25 mg, 1 p.o. nightly. Continue Klonopin 1 mg, 1 p.o. 3 times daily as needed.  (He takes routinely.) Continue Luvox 100 mg, 1 p.o. twice daily. Continue Lamictal 100 mg daily. Continue therapy with Dr. [name]. Return in 6 weeks.  Donnal Moat, PA-C

## 2021-09-10 ENCOUNTER — Telehealth: Payer: Self-pay

## 2021-09-10 ENCOUNTER — Ambulatory Visit (INDEPENDENT_AMBULATORY_CARE_PROVIDER_SITE_OTHER): Payer: Medicare Other | Admitting: Psychiatry

## 2021-09-10 DIAGNOSIS — F331 Major depressive disorder, recurrent, moderate: Secondary | ICD-10-CM | POA: Diagnosis not present

## 2021-09-10 DIAGNOSIS — F411 Generalized anxiety disorder: Secondary | ICD-10-CM

## 2021-09-10 DIAGNOSIS — F428 Other obsessive-compulsive disorder: Secondary | ICD-10-CM

## 2021-09-10 DIAGNOSIS — T887XXA Unspecified adverse effect of drug or medicament, initial encounter: Secondary | ICD-10-CM

## 2021-09-10 DIAGNOSIS — G3184 Mild cognitive impairment, so stated: Secondary | ICD-10-CM

## 2021-09-10 DIAGNOSIS — F4010 Social phobia, unspecified: Secondary | ICD-10-CM | POA: Diagnosis not present

## 2021-09-10 DIAGNOSIS — Z1589 Genetic susceptibility to other disease: Secondary | ICD-10-CM

## 2021-09-10 DIAGNOSIS — F5105 Insomnia due to other mental disorder: Secondary | ICD-10-CM | POA: Diagnosis not present

## 2021-09-10 DIAGNOSIS — F99 Mental disorder, not otherwise specified: Secondary | ICD-10-CM

## 2021-09-10 NOTE — Progress Notes (Signed)
Psychotherapy Progress Note [facility] [name], PhD LP  Patient ID: Richard Wilson [facility] [name])    MRN: [id number] Therapy format: Individual psychotherapy Date: 09/10/2021      Start: 1:10p     Stop: 2:00p     Time Spent: 50 min Location: In-person   Session narrative (presenting needs, interim history, self-report of stressors and symptoms, applications of prior therapy, status changes, and interventions made in session) Says he called Coralyn Helling but felt brushed off when he told them he's 71 and they recommended something in [place] (?).  Reminded of [place], in [place], encouraged to contact, enough to actually know what they do and whether it suits him, and dispel tendencies to imagine disappointment or disdain.  [place] again Sunday, figures to stick it out a month.  Irritated by a child's dysfluent reading, volunteered himself to read in the future.  Stopped himself from compulsively buying more acceptable, higher quality candles, recognizing it would come across as snobbery and control.  Went back to Dr. Lanice Shirts practice, twice, saw PAs, Rx'd Meloxicam, which is helpful.  Got xrays, results yesterday and conflicting interpretation of completely negative vs. a little degenerated joints in his hand.  Reviewed findings available on EHR and interpreted.  Feels left behind by technology, incompetent in modern life.  Encouraged re possibility of getting computer tutoring, e.g., through [employer].  Family news recently that nephew's wife attempted suicide.  Back story of a shotgun wedding and maybe emotionally barren parents of hers.  Suggestion of PPD.  Support/empathy provided.   Therapeutic modalities: Cognitive Behavioral Therapy, Solution-Oriented/Positive Psychology, Ego-Supportive, and Faith-sensitive  Mental Status/Observations:  Appearance:   Casual and Neat     Behavior:  Complaining, responsive  Motor:  Normal   Speech/Language:   Clear and Coherent  Affect:  Appropriate and mildly affected  Mood:  anxious and dysthymic  Thought process:  Some flight  Thought content:    Worries and dissatisfactions, largely  Sensory/Perceptual disturbances:    WNL  Orientation:  Fully oriented  Attention:  Good    Concentration:  Fair  Memory:  Some limitation  Insight:    Fair  Judgment:   Good  Impulse Control:  Fair   Risk Assessment: Danger to Self: No Self-injurious Behavior: No Danger to Others: No Physical Aggression / Violence: No Duty to Warn: No Access to Firearms a concern: No  Assessment of progress:  stabilized  Diagnosis:   ICD-10-CM   1. Generalized anxiety disorder  F41.1     2. Major depressive disorder, recurrent episode, moderate (HCC)  F33.1     3. Social anxiety disorder  F40.10     4. Insomnia due to other mental disorder  F51.05    F99     5. Mild cognitive impairment of uncertain or unknown etiology -- suspect combination of nutritional deficiency, anxiety, sedative effects, and dyssomnia with possible apnea  G31.84     6. Heterozygous MTHFR mutation C677T  Z15.89     7. Compulsive shopping  F42.8     8. Medication side effects (dry mouth)  T88.7XXA      Plan:  Social connections and morale -- Check with Coralyn Helling.  Options about church attendance, just stay clear that is for curiosity and assessment, social opportunity, not compulsive sense of duty. Financial organization -- Continue banking/arithmetic measures.  If not yet, should approach bank about his annuity, may be able to safely increase withdrawals ahead of expected cost  hikes. Medication organization -- Use a standard pillbox to organize meds rather than rely on daily concentration and organization Nutrition -- Prefer Cerefolin to simple B12 based on history and assessment, but understand prohibitive cost.  Still recommend OTC B complex as preferable to B12 only, given indications of broader nutritional  issues.  Maintain varied food sources, try to improve protein content, regulate vitamin supplementation, restrain late carbs.  Continue improved vegetable content but go for reduced-sodium if canned juice. Mouth and hand pain problems -- Address with approved use of OTC and Meloxicam per PCP's advice.  See through given referral and feedback to PCP.  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 PCP relationship and medical organization -- Work with PCP in good faith to fully inform and ask necessary questions, fully comprehend medical advice, written instructions may help.  Care coordination with psychiatry and PCP ad lib. Other recommendations/advice as may be noted above Continue to utilize previously learned skills ad lib Maintain medication as prescribed and work faithfully with relevant prescriber(s) if any changes are desired or seem indicated Call the clinic on-call service, 988/hotline, 911, or present to [facility] or ER if any life-threatening psychiatric crisis Return for session(s) already scheduled. Already scheduled visit in this office 09/24/2021.  Blanchie Serve, PhD [name], PhD LP Clinical Psychologist, [facility] [address], [place], [place] [address] [phone]

## 2021-09-10 NOTE — Telephone Encounter (Signed)
Pt call to find out about his x ray for his jaw. Dr. Tomi Bamberger has given pt meloxicam and referred pt to Dr. Consuella Lose oral surgeon. Pt was advised . Pt also let me know that the meloxiacm is working for is hand as well and will not need the referral to hand ortho now. [place]

## 2021-09-15 ENCOUNTER — Other Ambulatory Visit: Payer: Self-pay | Admitting: Internal Medicine

## 2021-09-15 ENCOUNTER — Ambulatory Visit (INDEPENDENT_AMBULATORY_CARE_PROVIDER_SITE_OTHER): Payer: Medicare Other | Admitting: Psychiatry

## 2021-09-15 DIAGNOSIS — M79641 Pain in right hand: Secondary | ICD-10-CM

## 2021-09-15 DIAGNOSIS — F4010 Social phobia, unspecified: Secondary | ICD-10-CM

## 2021-09-15 DIAGNOSIS — F411 Generalized anxiety disorder: Secondary | ICD-10-CM

## 2021-09-15 DIAGNOSIS — F5105 Insomnia due to other mental disorder: Secondary | ICD-10-CM | POA: Diagnosis not present

## 2021-09-15 DIAGNOSIS — G3184 Mild cognitive impairment, so stated: Secondary | ICD-10-CM

## 2021-09-15 DIAGNOSIS — F331 Major depressive disorder, recurrent, moderate: Secondary | ICD-10-CM | POA: Diagnosis not present

## 2021-09-15 DIAGNOSIS — F99 Mental disorder, not otherwise specified: Secondary | ICD-10-CM

## 2021-09-15 NOTE — Progress Notes (Signed)
Psychotherapy Progress Note [facility] Richard Moore, PhD LP  Patient ID: Richard Wilson [facility] "Richard Wilson")    MRN: [id number] Therapy format: Individual psychotherapy Date: 09/15/2021      Start: 1:08p     Stop: 1:55p     Time Spent: 47 min Location: In-person   Session narrative (presenting needs, interim history, self-report of stressors and symptoms, applications of prior therapy, status changes, and interventions made in session) Scheduled short notice, partly for knowing there would be a 3 week break to next appt.  EMR shows TC after last appt to his secondary PCP's office shows report of Meloxicam for jaw is also working for hand and no need for hand specialist referral.  (Nevertheless, order on chart a/o today for hand surgeon consultation, and 9/15 appt with otolaryngologist in the [facility].)  Says he called sister Friday, told her about his food stamp reduction ($200 to $23).  When she made a political aside, he went off what he thinks of Trump (her choice), would up hanging up, then cried 20 minutes feeling guilty.  Felt he could not abide, couldn't enjoy church shamed as he felt.  Yesterday called Richard Wilson and apologized, went very well.  Affirmed and encouraged in holding onto her ability to let him have differing opinions and to receive him graciously.  Again turns to broad pronouncements of his own incapacity.  Feels he doesn't know how to manage his life, and ashamed to be 33 without a clue.  Probed what may be triggering the mild histrionics, and church seems to be the focus.  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  Blames God for losing his career after 4 nervous breakdowns.  Lightly reminded there were a collection of influences, and he was trying  to make his way the best he could figure.  Has taken to telling people he has no emergency contact at all, in light of feeling distant with KJuliann Wilson though he also admits being in regular enough touch to get family news from her, and yesterday's reconciliation was potent.  Attempted to get him to designate someone who could make emergency decisions in his interest if incapacitated, but he misunderstood the difference between POA and next of kin.  Re. His hand, will begin PT (OT?) next week, but wonders what's it all for, if it doesn't hurt any more.  Educated that good therapy would help prevent it from hurting in the future, give him more ability to prevent.  Therapeutic modalities: Cognitive Behavioral Therapy, Solution-Oriented/Positive Psychology, Ego-Supportive, Psycho-education/Bibliotherapy, and Faith-sensitive  Mental Status/Observations:  Appearance:   Casual     Behavior:  Appropriate  Motor:  Normal  Speech/Language:   Clear and Coherent  Affect:  Appropriate  Mood:  anxious and dysthymic, better  Thought process:  Mild flight  Thought content:    Worries and disappointments, less intense  Sensory/Perceptual disturbances:    WNL  Orientation:  Fully oriented  Attention:  Good    Concentration:  Fair  Memory:  grossly intact  Insight:    Fair  Judgment:   Fair  Impulse Control:  Good   Risk Assessment: Danger to Self: No Self-injurious Behavior: No Danger to Others: No Physical Aggression / Violence: No Duty to Warn: No Access to Firearms a concern: No  Assessment of progress:  progressing  Diagnosis:   ICD-10-CM   1. Generalized anxiety disorder  F41.1     2. Major depressive disorder, recurrent episode, moderate (HCC)  F33.1     3. Social anxiety disorder  F40.10     4. Insomnia due to other mental disorder  F51.05    F99     5. Mild cognitive impairment of uncertain or unknown etiology -- suspect combination of nutritional deficiency, anxiety, sedative  effects, and dyssomnia with possible apnea  G31.84      Plan:  Social connections and morale -- Check with [facility] group.  Free choice about church, just stay clear he is free to assess, free to dig in a while, and free to decided if/when to offer his work in [employer] when truly ready, not compelled by overwrought sense of duty.  Consider reinstating Richard Wilson as his next-of-kin, possibly POA. Financial organization -- Continue banking/arithmetic measures.  If not yet, should approach bank about his annuity, may be able to safely increase withdrawals ahead of expected cost hikes. Medication organization -- Use a standard pillbox to organize meds rather than rely on daily concentration and organization Nutrition -- Prefer Cerefolin to simple B12 based on history and assessment, but understand prohibitive cost.  Still recommend OTC B complex as preferable to B12 only, given indications of broader nutritional issues.  Maintain varied food sources, try to improve protein content, regulate vitamin supplementation, restrain late carbs.  Continue improved vegetable content but go for reduced-sodium if canned juice. Mouth and hand pain problems -- Address with approved OTC and Meloxicam per PCP's advice.  See through given referrals including PT for hand and otolaryngology for TMJ evaluation.  Maintain hydration to combat dry mouth and prevent any suspected salivary gland inflammation. PCP relationship and medical organization -- Work with PCP in good faith to fully inform and ask necessary questions, fully comprehend medical advice, written instructions may help.  Care coordination with psychiatry and PCP ad lib. Other recommendations/advice as may be noted above Continue to utilize previously learned skills ad lib Maintain medication as prescribed and work faithfully with relevant prescriber(s) if any changes are desired or seem indicated Call the clinic on-call service, 988/hotline, 911, or  present to [facility] or ER if any life-threatening psychiatric crisis Return for session(s) already scheduled. Already scheduled visit in this office 09/24/2021.  Blanchie Serve, PhD Richard Moore, PhD LP Clinical Psychologist, [facility] [address] [place], [place] [address] [phone]

## 2021-09-23 ENCOUNTER — Ambulatory Visit: Payer: Medicare Other | Admitting: Orthopaedic Surgery

## 2021-09-23 ENCOUNTER — Ambulatory Visit (INDEPENDENT_AMBULATORY_CARE_PROVIDER_SITE_OTHER): Payer: Medicare Other | Admitting: Psychiatry

## 2021-09-23 DIAGNOSIS — Z1589 Genetic susceptibility to other disease: Secondary | ICD-10-CM

## 2021-09-23 DIAGNOSIS — F411 Generalized anxiety disorder: Secondary | ICD-10-CM

## 2021-09-23 DIAGNOSIS — F331 Major depressive disorder, recurrent, moderate: Secondary | ICD-10-CM

## 2021-09-23 DIAGNOSIS — G3184 Mild cognitive impairment, so stated: Secondary | ICD-10-CM

## 2021-09-23 DIAGNOSIS — F428 Other obsessive-compulsive disorder: Secondary | ICD-10-CM

## 2021-09-23 DIAGNOSIS — F4010 Social phobia, unspecified: Secondary | ICD-10-CM

## 2021-09-23 DIAGNOSIS — Z91148 Patient's other noncompliance with medication regimen for other reason: Secondary | ICD-10-CM

## 2021-09-23 DIAGNOSIS — G479 Sleep disorder, unspecified: Secondary | ICD-10-CM

## 2021-09-23 NOTE — Progress Notes (Signed)
Psychotherapy Progress Note [facility] [name], PhD LP  Patient ID: [facility] "Richard Wilson")    MRN: [id number] Therapy format: Individual psychotherapy Date: 09/23/2021      Start: 5:15p     Stop: 6:00p     Time Spent: 45 min Location: In-person   Session narrative (presenting needs, interim history, self-report of stressors and symptoms, applications of prior therapy, status changes, and interventions made in session) Rashly canceled a PT appt today, convincing himself it's not any use if he isn't in pain, and Meloxicam has worked.  Off it now, too.  Says he was told, to his annoyance, that the two PT appts on the books were for different services, so he kept the 2nd one (8/25) which will act as the first now.  Confronted conclusion-jumping and redirected to ask questions of his providers rather than make unfounded assumptions about what should be done.  Meanwhile, says he made a huge mistake signing up for a dating site and found out it is notorious for not letting people leave their contract.  Has already made customer service calls and visited his bank to stop payment, so commended on his assertiveness and encouraged him to dwell more on having taken good action than to ruminate about getting into it in the first place.  Challenged shame and catastrophizing about going on it in the first place, affirming it was and is a valid choice he can make, not just "being desperate".    No indication he has looked further into the [facility] group.  Did go back to [facility] past Sunday but agonizing again about his choice, whether he even cares about church, whether he'd feel obligated or ashamed showing back up at [facility] in [place].  Reviewed and re-validated his thinking about going to a large enough, "broad" church in good commuting distance.    Therapeutic modalities: Cognitive Behavioral Therapy, Solution-Oriented/Positive Psychology, and  Faith-sensitive  Mental Status/Observations:  Appearance:   Casual     Behavior:  Appropriate  Motor:  Normal  Speech/Language:   Clear and Coherent  Affect:  Mildly labile  Mood:  anxious, dysthymic, and irritable  Thought process:  Some flight  Thought content:    Rumination and conclusion-jumping  Sensory/Perceptual disturbances:    WNL  Orientation:  Fully oriented  Attention:  Good    Concentration:  Fair  Memory:  WNL  Insight:    Fair  Judgment:   Fair  Impulse Control:  Fair   Risk Assessment: Danger to Self: No Self-injurious Behavior: No Danger to Others: No Physical Aggression / Violence: No Duty to Warn: No Access to Firearms a concern: No  Assessment of progress:  stabilized  Diagnosis:   ICD-10-CM   1. Generalized anxiety disorder  F41.1     2. Major depressive disorder, recurrent episode, moderate (HCC)  F33.1     3. Social anxiety disorder  F40.10     4. Mild cognitive impairment of uncertain or unknown etiology -- suspect combination of nutritional deficiency, anxiety, sedative effects, and dyssomnia with possible apnea  G31.84     5. Heterozygous MTHFR mutation C677T  Z15.89     6. Compulsive shopping  F42.8     7. 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 Variable compliance with medication and medical advice  Z91.148      Plan:  Social connections and morale -- Check with [facility] group.  Free choice about church, just stay  clear he is free to assess, free to dig in a while, and free to decide if/when to offer his work in [employer] when truly ready, not compelled by overwrought sense of duty.  Consider reinstating Juliann Pulse as his next-of-kin, possibly POA. Financial organization -- Continue banking/arithmetic measures.  If not yet, should approach bank about his annuity, may be able to safely increase withdrawals ahead of expected cost hikes. Medication organization -- Use a standard pillbox to organize meds rather than rely on daily concentration  and organization, which vary Nutrition -- Prefer Cerefolin to simple B12 based on history and assessment, but understand prohibitive cost.  Still recommend OTC B complex as preferable to B12 only, given indications of broader nutritional issues.  Maintain varied food sources, try to improve protein content, regulate vitamin supplementation, restrain late carbs.  Continue improved vegetable content but go for reduced-sodium if canned juice. Sleep hygiene -- maintain reasonably regular, timely bedtime, have electronics curfew.  Accept rest rather than sleep when not coming easy. Mouth and hand pain problems -- Address with approved OTC, Meloxicam, and PT per PCP's advice.  Make sure to comprehend referrals and instructions rather than let uncomfortable feelings drive irrational conclusion-jumping.  Maintain hydration to combat dry mouth and prevent any suspected salivary gland inflammation. PCP relationship and medical organization -- Work with PCP in good faith to fully inform and ask necessary questions, fully comprehend medical advice, written instructions may help.  Care coordination with psychiatry and PCP ad lib. Other recommendations/advice as may be noted above Continue to utilize previously learned skills ad lib Maintain medication as prescribed and work faithfully with relevant prescriber(s) if any changes are desired or seem indicated Call the clinic on-call service, 988/hotline, 911, or present to [facility] or ER if any life-threatening psychiatric crisis Return for session(s) already scheduled. Already scheduled visit in this office 09/24/2021.  Blanchie Serve, PhD [name], PhD LP Clinical Psychologist, [facility] [address], [place], [place] [address] [phone]

## 2021-09-23 NOTE — Progress Notes (Deleted)
Office Visit Note   Patient: Richard Wilson           Date of Birth: 12/03/1950           MRN: [id number] Visit Date: 09/23/2021              Requested by: Carlena Hurl, PA-C [address] [place],  [place] [address] PCP: Denita Lung, MD   Assessment & Plan: Visit Diagnoses: No diagnosis found.  Plan: ***  Follow-Up Instructions: No follow-ups on file.   Orders:  No orders of the defined types were placed in this encounter.  No orders of the defined types were placed in this encounter.     Procedures: No procedures performed   Clinical Data: No additional findings.   Subjective: No chief complaint on file.   HPI  Review of Systems  Constitutional: Negative.   All other systems reviewed and are negative.   Objective: Vital Signs: There were no vitals taken for this visit.  Physical Exam Vitals and nursing note reviewed.  Constitutional:      Appearance: He is well-developed.  Pulmonary:     Effort: Pulmonary effort is normal.  Abdominal:     Palpations: Abdomen is soft.  Skin:    General: Skin is warm.  Neurological:     Mental Status: He is alert and oriented to person, place, and time.  Psychiatric:        Behavior: Behavior normal.        Thought Content: Thought content normal.        Judgment: Judgment normal.   Ortho Exam  Specialty Comments:  No specialty comments available.  Imaging: No results found.   PMFS History: Patient Active Problem List   Diagnosis Date Noted  . 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  . Mild cognitive impairment of uncertain or unknown etiology 01/21/2021  . Intention tremor   . Chronic idiopathic constipation 10/23/2020  . Orthostatic lightheadedness 10/23/2020  . Erectile dysfunction 10/23/2020  . Hyperglycemia 09/24/2020  . 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  . 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  . Thrombocytopenia 04/08/2020  . Generalized anxiety disorder 01/01/2020  . Major depressive disorder  01/01/2020  . Primary osteoarthritis of both hips 07/30/2019  . Chronic kidney disease, stage 3a 03/19/2019  . Hyperlipidemia 12/15/2018  . Arthritis of knee 08/31/2017  . Nephrolithiasis 06/22/2016  . Obstructive sleep apnea 04/13/2015  . Diverticulosis of large intestine without hemorrhage 09/30/2013  . Mild mitral regurgitation 08/15/2013  . Diastolic dysfunction 60/73/7106  . Elevated PSA 12/22/2012  . RBBB (right bundle branch block) 12/03/2012   Past Medical History:  Diagnosis Date  . Arthritis of knee 08/31/2017  . Chronic idiopathic constipation 10/23/2020  . Chronic kidney disease, stage 3a 03/19/2019  . Diastolic dysfunction 26/94/8546  . Diverticulosis of large intestine without hemorrhage 09/30/2013  . Elevated PSA 12/22/2012  . Erectile dysfunction 10/23/2020  . Generalized anxiety disorder 01/01/2020  . Hyperglycemia 09/24/2020  . Intention tremor   . Major depressive disorder 01/01/2020  . Mild cognitive impairment of uncertain or unknown etiology 01/21/2021  . Mild mitral regurgitation 08/15/2013  . Nephrolithiasis 06/22/2016  . Obstructive sleep apnea 04/13/2015  . Orthostatic lightheadedness 10/23/2020  . Primary osteoarthritis of both hips 07/30/2019  . Pure hypercholesterolemia 12/15/2018  . RBBB (right bundle branch block) 12/03/2012  . 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  . Thrombocytopenia 04/08/2020    Family History  Problem Relation Age of Onset  . Cancer Mother        Ovarian  . Heart  disease Mother   . Memory loss Mother        with advanced age  . Prostate cancer Father   . Throat cancer Maternal Grandfather   . Heart attack Paternal Grandfather   . Cancer Maternal Aunt        Breast, metastatic    Past Surgical History:  Procedure Laterality Date  . APPENDECTOMY    . FOOT TENDON SURGERY Right   . TONSILLECTOMY     Social History   Occupational History  . Occupation: Disability    Comment: 2001 for GAD  . Occupation: Licensed conveyancer   . Occupation: Retired    Comment: Former Art therapist  Tobacco Use  . Smoking status: Never  . Smokeless tobacco: Never  Vaping Use  . Vaping Use: Never used  Substance and Sexual Activity  . Alcohol use: Not Currently    Comment: occasional glass of wine  . Drug use: Never  . Sexual activity: Not Currently

## 2021-09-24 ENCOUNTER — Ambulatory Visit (INDEPENDENT_AMBULATORY_CARE_PROVIDER_SITE_OTHER): Payer: Medicare Other | Admitting: Physician Assistant

## 2021-09-24 ENCOUNTER — Encounter: Payer: Self-pay | Admitting: Physician Assistant

## 2021-09-24 DIAGNOSIS — F411 Generalized anxiety disorder: Secondary | ICD-10-CM | POA: Diagnosis not present

## 2021-09-24 DIAGNOSIS — F5105 Insomnia due to other mental disorder: Secondary | ICD-10-CM | POA: Diagnosis not present

## 2021-09-24 DIAGNOSIS — F99 Mental disorder, not otherwise specified: Secondary | ICD-10-CM | POA: Diagnosis not present

## 2021-09-24 DIAGNOSIS — F3341 Major depressive disorder, recurrent, in partial remission: Secondary | ICD-10-CM | POA: Diagnosis not present

## 2021-09-24 MED ORDER — FLUVOXAMINE MALEATE 100 MG PO TABS: 200.0000 mg | ORAL_TABLET | Freq: Every day | ORAL | 2 refills | Status: DC

## 2021-09-24 NOTE — Progress Notes (Signed)
[facility]  Patient ID: Manfred Laspina,  MRN: [id number]  PCP: Denita Lung, MD  Date of Evaluation: 09/24/2021 Tme spent:20 minutes  Chief Complaint:  Chief Complaint   Anxiety; Depression; Insomnia; Follow-up    HISTORY/CURRENT STATUS: HPI For routine med check.    Last seen 09/08/2021 with recommendation to return in 6 weeks.  This appointment must have already been scheduled, and he just kept it.   Doing well.  He feels like medications are still working well.  "I think we have the right combination."  He is sleeping well most of the time.  The amitriptyline has been very helpful.  He feels rested when he gets up.  Anxiety is well controlled.  He does take the Klonopin 3 times daily routinely.  We have been unsuccessful in weaning in the past.  He is not having any falls or confusion that could be related to the benzodiazepine.  Patient is able to enjoy things.  Energy and motivation are good.  He is still reading and enjoys that.  Continues to look for a new church.  No extreme sadness, tearfulness, or feelings of hopelessness. ADLs and personal hygiene are normal.   Denies any changes in concentration, making decisions, or remembering things.  Appetite has not changed.  Weight is stable.  Denies suicidal or homicidal thoughts.  Patient denies increased energy with decreased need for sleep, increased talkativeness, racing thoughts, impulsivity or risky behaviors, increased spending, increased libido, grandiosity, increased irritability or anger, paranoia, and no hallucinations.  Denies dizziness, syncope, seizures, numbness, tremor, tics, unsteady gait, slurred speech, confusion.  Denies dystonia.  Individual Medical History/ Review of Systems: Changes? :Yes    will be seeing Dr. Sherrian Divers tomorrow for paresthesia in right hand.  TMJ symptoms are better.  Currently not taking meloxicam, for 3 to 4 days now.  Past medications for mental health diagnoses include: Rozerem,  Trazodone caused vivid dreams, Xanax, Buspar, mirtazapine caused excessive somnolence and fatigue with flulike symptoms, Belsomra, propranolol for tremor, Zoloft, Abilify caused tremor, Klonopin, Effexor, Ativan, Wellbutrin, Nardil, gabapentin, Librium, Luvox, Amitriptyline   Pertinent info after reviewing Dr. Evelena Leyden notes, his previous psychiatrist in [facility]. Past suicide attempt in September 1996, by taking Tylenol.  He was admitted at that time to a psychiatric hospital in [facility].  Admitted to psych hospital multiple times in the past for "nervous breakdowns."  January 1985, February 1986, October 1991, September 1996, July 2000.  Allergies: Garamycin [gentamicin] and Penicillins  Current Medications:  Current Outpatient Medications:    amitriptyline (ELAVIL) 25 MG tablet, Take 1 tablet (25 mg total) by mouth at bedtime., Disp: 90 tablet, Rfl: 0   clonazePAM (KLONOPIN) 1 MG tablet, Take 1 tablet (1 mg total) by mouth 3 (three) times daily as needed for anxiety., Disp: 90 tablet, Rfl: 2   hydrocortisone 2.5 % ointment, Apply topically daily. Use once a day on eyebrows- dermatitis, Disp: , Rfl:    lamoTRIgine (LAMICTAL) 100 MG tablet, Take 1 tablet (100 mg total) by mouth daily., Disp: 90 tablet, Rfl: 1   meloxicam (MOBIC) 7.5 MG tablet, Take 1 tablet (7.5 mg total) by mouth daily. Take until pain has resolved.  Take with food, Disp: 15 tablet, Rfl: 0   Probiotic Product (PROBIOTIC-10) CHEW, Chew by mouth., Disp: , Rfl:    psyllium (METAMUCIL) 58.6 % powder, Take 1 packet by mouth in the morning and at bedtime., Disp: , Rfl:    sildenafil (VIAGRA) 100 MG tablet,  Take 1 tablet (100 mg total) by mouth daily as needed for erectile dysfunction., Disp: 10 tablet, Rfl: 0   vitamin B-12 (CYANOCOBALAMIN) 1000 MCG tablet, Take 1,000 mcg by mouth daily., Disp: , Rfl:    fluvoxaMINE (LUVOX) 100 MG tablet, Take 2 tablets (200 mg total) by mouth at bedtime., Disp:  60 tablet, Rfl: 2   tiZANidine (ZANAFLEX) 4 MG tablet, Take 1 tablet (4 mg total) by mouth 2 (two) times daily as needed for muscle spasms. (Patient not taking: Reported on 09/08/2021), Disp: 14 tablet, Rfl: 0 Medication Side Effects: sexual dysfunction   Family Medical/ Social History: Changes?  no  MENTAL HEALTH EXAM:  There were no vitals taken for this visit.There is no height or weight on file to calculate BMI.  General Appearance: Casual and Well Groomed  Eye Contact:  Good  Speech:  Clear and Coherent and Normal Rate  Volume:  Normal  Mood:  Euthymic  Affect:  Congruent  Thought Process:  Goal Directed and Descriptions of Associations: Circumstantial  Orientation:  Full (Time, Place, and Person)  Thought Content: Logical   Suicidal Thoughts:  No  Homicidal Thoughts:  No  Memory:  WNL  Judgement:  Good  Insight:  Good  Psychomotor Activity:  Normal and no tremor present at this time.  Concentration:  Concentration: Good and Attention Span: Good  Recall:  Good  Fund of Knowledge: Good  Language: Good  Assets:  Desire for Improvement Financial Resources/Insurance Housing Transportation  ADL's:  Intact  Cognition: WNL  Prognosis:  Good   Gene site test results are on chart under media. See neuro psych test results on chart.[name] [name] 01/21/2020  Note from neuro, Sharene Butters, PA-C from 07/21/2021.   DIAGNOSES:    ICD-10-CM   1. Generalized anxiety disorder  F41.1     2. Recurrent major depression in partial remission ([place])  F33.41     3. Insomnia due to other mental disorder  F51.05    F99       Receiving Psychotherapy: Yes    Dr. Jonni Sanger Mitchum.   RECOMMENDATIONS:  PDMP was reviewed.  Klonopin last filled 09/13/2021.   I provided 20 minutes of face to face time during this encounter, including time spent before and after the visit in records review, medical decision making, counseling pertinent to today's visit, and charting.   He is doing really well  with his current medications so no changes will be made.  Continue amitriptyline 25 mg, 1 p.o. nightly. Continue Klonopin 1 mg, 1 p.o. 3 times daily as needed.  (He takes routinely.) Continue Luvox 100 mg, 2 p.o. nightly.   Continue Lamictal 100 mg daily. Continue vitamins as per med list. Continue therapy with Dr. [name]. Return in 6 weeks.  Donnal Moat, PA-C

## 2021-09-25 ENCOUNTER — Ambulatory Visit: Payer: Medicare Other | Admitting: Orthopaedic Surgery

## 2021-09-25 ENCOUNTER — Ambulatory Visit (INDEPENDENT_AMBULATORY_CARE_PROVIDER_SITE_OTHER): Payer: Medicare Other | Admitting: Orthopaedic Surgery

## 2021-09-25 ENCOUNTER — Ambulatory Visit: Payer: Medicare Other | Admitting: Psychiatry

## 2021-09-25 DIAGNOSIS — M25531 Pain in right wrist: Secondary | ICD-10-CM

## 2021-09-25 MED ORDER — IBUPROFEN 800 MG PO TABS: 800.0000 mg | ORAL_TABLET | Freq: Three times a day (TID) | ORAL | 2 refills | Status: DC | PRN

## 2021-09-25 NOTE — Progress Notes (Signed)
Office Visit Note   Patient: Richard Wilson           Date of Birth: 11/10/1950           MRN: [id number] Visit Date: 09/25/2021              Requested by: Carlena Hurl, PA-C [address] [place],  [place] [address] PCP: Denita Lung, MD   Assessment & Plan: Visit Diagnoses:  1. Pain in right wrist     Plan: Impression is mild de Quervain's tenosynovitis.  We will send in prescription for ibuprofen and immobilized in a thumb spica brace which she can wean as symptoms improved.  Follow-up as needed.  Follow-Up Instructions: No follow-ups on file.   Orders:  No orders of the defined types were placed in this encounter.  Meds ordered this encounter  Medications   ibuprofen (ADVIL) 800 MG tablet    Sig: Take 1 tablet (800 mg total) by mouth every 8 (eight) hours as needed.    Dispense:  30 tablet    Refill:  2      Procedures: No procedures performed   Clinical Data: No additional findings.   Subjective: Chief Complaint  Patient presents with   Right Hand - Pain    HPI Richard Wilson is a 71 year old gentleman who reports pain in the radial aspect of the wrist and the base of the thumb as well as the first webspace.  Feels like there is pins-and-needles all the time.  Cannot unscrew jar without pain.  Pain started about 6 months ago.  Does not interfere with sleeping.  Denies any injuries.  Review of Systems  Constitutional: Negative.   All other systems reviewed and are negative.    Objective: Vital Signs: There were no vitals taken for this visit.  Physical Exam Vitals and nursing note reviewed.  Constitutional:      Appearance: He is well-developed.  HENT:     Head: Normocephalic and atraumatic.  Eyes:     Pupils: Pupils are equal, round, and reactive to light.  Pulmonary:     Effort: Pulmonary effort is normal.  Abdominal:     Palpations: Abdomen is soft.  Musculoskeletal:        General: Normal range of motion.     Cervical back: Neck  supple.  Skin:    General: Skin is warm.  Neurological:     Mental Status: He is alert and oriented to person, place, and time.  Psychiatric:        Behavior: Behavior normal.        Thought Content: Thought content normal.        Judgment: Judgment normal.     Ortho Exam Examination of the right hand shows normal capillary refill and sensation.  Mildly positive Finkelstein's.  Negative grind test.  There is no triggering.  Negative Tinel of the superficial radial nerve branches. Specialty Comments:  No specialty comments available.  Imaging: No results found.   PMFS History: Patient Active Problem List   Diagnosis Date Noted   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   Mild cognitive impairment of uncertain or unknown etiology 01/21/2021   Intention tremor    Chronic idiopathic constipation 10/23/2020   Orthostatic lightheadedness 10/23/2020   Erectile dysfunction 10/23/2020   Hyperglycemia 09/24/2020   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   Thrombocytopenia 04/08/2020   Generalized anxiety disorder 01/01/2020   Major depressive disorder 01/01/2020   Primary osteoarthritis of both hips 07/30/2019  Chronic kidney disease, stage 3a 03/19/2019   Hyperlipidemia 12/15/2018   Arthritis of knee 08/31/2017   Nephrolithiasis 06/22/2016   Obstructive sleep apnea 04/13/2015   Diverticulosis of large intestine without hemorrhage 09/30/2013   Mild mitral regurgitation 00/92/3300   Diastolic dysfunction 76/22/6333   Elevated PSA 12/22/2012   RBBB (right bundle branch block) 12/03/2012   Past Medical History:  Diagnosis Date   Arthritis of knee 08/31/2017   Chronic idiopathic constipation 10/23/2020   Chronic kidney disease, stage 3a 54/56/2563   Diastolic dysfunction 89/37/3428   Diverticulosis of large intestine without hemorrhage 09/30/2013   Elevated PSA 12/22/2012   Erectile dysfunction 10/23/2020   Generalized anxiety disorder 01/01/2020    Hyperglycemia 09/24/2020   Intention tremor    Major depressive disorder 01/01/2020   Mild cognitive impairment of uncertain or unknown etiology 01/21/2021   Mild mitral regurgitation 08/15/2013   Nephrolithiasis 06/22/2016   Obstructive sleep apnea 04/13/2015   Orthostatic lightheadedness 10/23/2020   Primary osteoarthritis of both hips 07/30/2019   Pure hypercholesterolemia 12/15/2018   RBBB (right bundle branch block) 12/03/2012   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   Thrombocytopenia 04/08/2020    Family History  Problem Relation Age of Onset   Cancer Mother        Ovarian   Heart disease Mother    Memory loss Mother        with advanced age   Prostate cancer Father    Throat cancer Maternal Grandfather    Heart attack Paternal Grandfather    Cancer Maternal Aunt        Breast, metastatic    Past Surgical History:  Procedure Laterality Date   APPENDECTOMY     FOOT TENDON SURGERY Right    TONSILLECTOMY     Social History   Occupational History   Occupation: Disability    Comment: 2001 for GAD   Occupation: Licensed conveyancer   Occupation: Retired    Comment: Former Art therapist  Tobacco Use   Smoking status: Never   Smokeless tobacco: Never  Vaping Use   Vaping Use: Never used  Substance and Sexual Activity   Alcohol use: Not Currently    Comment: occasional glass of wine   Drug use: Never   Sexual activity: Not Currently

## 2021-09-29 ENCOUNTER — Ambulatory Visit (INDEPENDENT_AMBULATORY_CARE_PROVIDER_SITE_OTHER): Payer: Medicare Other | Admitting: Psychiatry

## 2021-09-29 DIAGNOSIS — F4010 Social phobia, unspecified: Secondary | ICD-10-CM | POA: Diagnosis not present

## 2021-09-29 DIAGNOSIS — F331 Major depressive disorder, recurrent, moderate: Secondary | ICD-10-CM

## 2021-09-29 DIAGNOSIS — G3184 Mild cognitive impairment, so stated: Secondary | ICD-10-CM

## 2021-09-29 DIAGNOSIS — F99 Mental disorder, not otherwise specified: Secondary | ICD-10-CM

## 2021-09-29 DIAGNOSIS — F411 Generalized anxiety disorder: Secondary | ICD-10-CM

## 2021-09-29 DIAGNOSIS — F5105 Insomnia due to other mental disorder: Secondary | ICD-10-CM

## 2021-09-29 NOTE — Progress Notes (Signed)
Psychotherapy Progress Note [facility] [name], PhD LP  Patient ID: Richard Wilson [facility] [name])    MRN: [id number] Therapy format: Individual psychotherapy Date: 09/29/2021      Start: 9:13a     Stop: 10:00a     Time Spent: 47 min Location: In-person   Session narrative (presenting needs, interim history, self-report of stressors and symptoms, applications of prior therapy, status changes, and interventions made in session) Erroneously went to church 90 min early Sunday, greeted the priest, decided not to stay, went home to bed, felt guilty.  Sunday night looked up another [employer], [place], Monday visited midday mass.  Thinking it may be the thing he does for a while -- take in an intimate gathering of older folks, some history lesson in the process.  Caught and restrained impulse to figure out how to "pay dues" by signing up for lay duties.  Thinking through things like what he could do with his time instead of shop, sleep, or watch porn.  Affirmed as constructive, engaging his recovery better.  Bookkeeping has been going fine since adopting a calculator for arithmetic.  Concerned to find his balance off again, this time conservatively, but just can't make it work.  Suggested there are probably a couple automatic transactions or deposits he has not yet become aware of, but if he'd like to review with banker, he may.  Considering the course of his life, recalling father lived to 33, M to 32.  Recalls "mistake" moving from [facility] to [place], [place] [facility]) with his mother to attain the help of his sister.  M was never particularly happy, declined, eventually went to assisted living and loved it, then dies 11 days into it.  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  Therapeutic modalities: Cognitive Behavioral Therapy, Solution-Oriented/Positive Psychology, and Narrative  Mental Status/Observations:  Appearance:   Casual     Behavior:  Appropriate  Motor:  Normal  Speech/Language:   Clear and Coherent  Affect:  Appropriate  Mood:  anxious, sad, and nonlabile  Thought process:  normal and some flight  Thought content:    WNL  Sensory/Perceptual disturbances:    WNL  Orientation:  Fully oriented  Attention:  Good    Concentration:  Fair  Memory:  grossly intact  Insight:    Fair  Judgment:   Good  Impulse Control:  Fair   Risk Assessment: Danger to Self: No Self-injurious Behavior: No Danger to Others: No Physical Aggression / Violence: No Duty to Warn: No Access to Firearms a concern: No  Assessment of progress:  progressing  Diagnosis:   ICD-10-CM   1. Generalized anxiety disorder  F41.1     2. Insomnia due to other mental disorder  F51.05    F99     3. Major depressive disorder, recurrent episode, moderate (HCC)  F33.1     4. Social anxiety disorder  F40.10     5. Mild cognitive impairment of uncertain or unknown etiology -- suspect combination of nutritional deficiency, anxiety, sedative effects, and dyssomnia with possible apnea  G31.84      Plan:  Social connections and morale -- Check with [place] group.  Free choice about church, just stay clear he is free to assess, free to dig in a while,  and free to decide if/when to offer his work in [employer] when truly ready, not compelled by overwrought sense of duty.  Consider reinstating Juliann Pulse as his next-of-kin, possibly POA. Financial organization -- Continue banking/arithmetic measures.  If not yet, should approach bank about his annuity, may be able to safely increase withdrawals ahead of expected cost hikes. Medication organization -- Use a standard pillbox to organize meds rather than rely on daily concentration and organization, which  vary Nutrition -- Prefer Cerefolin to simple B12 based on history and assessment, but understand prohibitive cost.  Still recommend OTC B complex as preferable to B12 only, given indications of broader nutritional issues.  Maintain varied food sources, try to improve protein content, regulate vitamin supplementation, restrain late carbs.  Continue improved vegetable content but go for reduced-sodium if canned juice. Sleep hygiene -- maintain reasonably regular, timely bedtime, have electronics curfew.  Accept rest rather than sleep when not coming easy. Mouth and hand pain problems -- Address with approved OTC, Meloxicam, and PT per PCP's advice.  Make sure to comprehend referrals and instructions rather than let uncomfortable feelings drive irrational conclusion-jumping.  Maintain hydration to combat dry mouth and prevent any suspected salivary gland inflammation. PCP relationship and medical organization -- Work with PCP in good faith to fully inform and ask necessary questions, fully comprehend medical advice, written instructions may help.  Care coordination with psychiatry and PCP ad lib. Regrets -- review reframes, reaffirm the good he was trying to do Other recommendations/advice as may be noted above Continue to utilize previously learned skills ad lib Maintain medication as prescribed and work faithfully with relevant prescriber(s) if any changes are desired or seem indicated Call the clinic on-call service, 988/hotline, 911, or present to [facility] or ER if any life-threatening psychiatric crisis No follow-ups on file. Already scheduled visit in this office 10/16/2021.  Blanchie Serve, PhD [name], PhD LP Clinical Psychologist, [facility] [address], [place], [place] [address] [phone]

## 2021-10-07 ENCOUNTER — Ambulatory Visit (INDEPENDENT_AMBULATORY_CARE_PROVIDER_SITE_OTHER): Payer: Medicare Other | Admitting: Psychiatry

## 2021-10-07 ENCOUNTER — Encounter: Payer: Self-pay | Admitting: Internal Medicine

## 2021-10-07 DIAGNOSIS — F331 Major depressive disorder, recurrent, moderate: Secondary | ICD-10-CM

## 2021-10-07 DIAGNOSIS — F4010 Social phobia, unspecified: Secondary | ICD-10-CM | POA: Diagnosis not present

## 2021-10-07 DIAGNOSIS — F411 Generalized anxiety disorder: Secondary | ICD-10-CM | POA: Diagnosis not present

## 2021-10-07 DIAGNOSIS — F99 Mental disorder, not otherwise specified: Secondary | ICD-10-CM

## 2021-10-07 DIAGNOSIS — F5105 Insomnia due to other mental disorder: Secondary | ICD-10-CM | POA: Diagnosis not present

## 2021-10-07 DIAGNOSIS — Z79899 Other long term (current) drug therapy: Secondary | ICD-10-CM

## 2021-10-07 NOTE — Progress Notes (Signed)
Psychotherapy Progress Note [facility] [name], PhD LP  Patient ID: Richard Wilson [facility] [name])    MRN: [id number] Therapy format: Individual psychotherapy Date: 10/07/2021      Start: 8:15a     Stop: 9:01a     Time Spent: 46 min Location: In-person   Session narrative (presenting needs, interim history, self-report of stressors and symptoms, applications of prior therapy, status changes, and interventions made in session) Scheduled early on request.  Frustrated with medications and feeling off.  Currently Elavil '25mg'$  and only one '100mg'$  Luvox QHS, Lamictal '100mg'$  QD, Klonopin TID as he has been since the winter, and B complex midday.  Has just started omitting the middle dose of Klonopin, hoping for more clarity.  Reviewed hx of trying to evaluate dementia, then wean Klonopin, encouraged in making it gradual, option to go with half dose midday if it turns distressing.  Sleeping OK but still a late pattern, 12-1, rising 6:45 - 7am, with a nap of about an hour in the afternoon.  Validated that that may be enough with the nap, better to be in bed earlier and get the whole 7-9 hrs together, but nap can be fine.  Knows he sometimes just wants to avoid being awake and feeling bad.  Further encouraged trim the video watching at night, consent to end the day and invest in better tomorrow.  Still wants to find a gay friend, clarified to mean a companion or lover.  Fears he is too old and undesirable, and sexually unable, but assured there has to be more to it that sexual functioning, and plenty of gay men will have reckoned with that by this stage, hopefully.    Now feeling [employer] in [place] will be his favored church.  Large enough, finds the 4 priests approachable, OK with the commute.  Interested in two men he's seen there, actually, and confesses he has wanted to meet one of them as a prospect.  Discussed the etiquette of attending a new church and seeking to hook up,  encouraged discretion, and openness to making friends first, lest he come across like he's just on the make.  Priest informed him there is a GLAAD group, baffled that the priest thought to volunteer that, but probably obvious from his affect and bearing that he is gay and could be interested.  Not interested in championing a cause, but agreed that the group could be a hub of support, too, and he is of course free to involve or detach as he sees fit once he knows more.    Noted more spontaneously catching himself before uttering exaggerated or histrionic ideas, and more spontaneously talking himself through suspending judgment, gathering information before jumping.  Affirmed and encouraged.  Therapeutic modalities: Cognitive Behavioral Therapy, Solution-Oriented/Positive Psychology, and Ego-Supportive  Mental Status/Observations:  Appearance:   Casual     Behavior:  Appropriate  Motor:  Normal  Speech/Language:   Clear and Coherent  Affect:  Appropriate and nonlabile  Mood:  anxious, dysthymic, and less  Thought process:  normal  Thought content:    WNL  Sensory/Perceptual disturbances:    WNL  Orientation:  Fully oriented  Attention:  Good    Concentration:  Fair  Memory:  WNL  Insight:    Fair  Judgment:   Good  Impulse Control:  Good   Risk Assessment: Danger to Self: No Self-injurious Behavior: No Danger to Others: No Physical Aggression / Violence: No Duty to Warn: No Access to Firearms  a concern: No  Assessment of progress:  progressing  Diagnosis:   ICD-10-CM   1. Generalized anxiety disorder  F41.1     2. Social anxiety disorder  F40.10     3. Major depressive disorder, recurrent episode, moderate (HCC)  F33.1     4. Insomnia due to other mental disorder  F51.05    F99     5. Long-term current use of benzodiazepine  Z79.899      Plan:  Sedation -- Endorse Klonopin weaning, favor starting with midday dose to ward of oversedation and napping Social connections  and morale -- Check with [place] group.  Free choice about church, just stay clear he is free to assess, free to dig in a while, and free to decide if/when to offer his work in [employer] when truly ready, not compelled by overwrought sense of duty.  Consider reinstating Juliann Pulse as his next-of-kin, possibly POA. Financial organization -- Continue banking/arithmetic measures.  If not yet, should approach bank about his annuity, may be able to safely increase withdrawals ahead of expected cost hikes. Medication organization -- Use a standard pillbox to organize meds rather than rely on daily concentration and organization, which vary Nutrition -- Prefer Cerefolin to simple B12 based on history and assessment, but understand prohibitive cost.  Still recommend OTC B complex as preferable to B12 only, given indications of broader nutritional issues.  Maintain varied food sources, try to improve protein content, regulate vitamin supplementation, restrain late carbs.  Continue improved vegetable content but go for reduced-sodium if canned juice. Sleep hygiene -- maintain reasonably regular, timely bedtime, have electronics curfew.  Accept rest rather than sleep when not coming easy. Mouth and hand pain problems -- Address with approved OTC, Meloxicam, and PT per PCP's advice.  Make sure to comprehend referrals and instructions rather than let uncomfortable feelings drive irrational conclusion-jumping.  Maintain hydration to combat dry mouth and prevent any suspected salivary gland inflammation. PCP relationship and medical organization -- Work with PCP in good faith to fully inform and ask necessary questions, fully comprehend medical advice, written instructions may help.  Care coordination with psychiatry and PCP ad lib. Regrets -- review reframes, reaffirm the good he was trying to do Other recommendations/advice as may be noted above Continue to utilize previously learned skills ad  lib Maintain medication as prescribed and work faithfully with relevant prescriber(s) if any changes are desired or seem indicated Call the clinic on-call service, 988/hotline, 911, or present to [facility] or ER if any life-threatening psychiatric crisis Return for session(s) already scheduled. Already scheduled visit in this office 10/16/2021.  Blanchie Serve, PhD [name], PhD LP Clinical Psychologist, [facility] [address] [place], [place] [address] [phone]

## 2021-10-08 ENCOUNTER — Encounter (HOSPITAL_COMMUNITY): Payer: Self-pay | Admitting: Emergency Medicine

## 2021-10-08 ENCOUNTER — Ambulatory Visit (HOSPITAL_COMMUNITY)
Admission: EM | Admit: 2021-10-08 | Discharge: 2021-10-08 | Disposition: A | Discharge status: Home / Self Care | Payer: Medicare Other | Attending: Family Medicine | Admitting: Family Medicine

## 2021-10-08 DIAGNOSIS — R059 Cough, unspecified: Secondary | ICD-10-CM | POA: Insufficient documentation

## 2021-10-08 DIAGNOSIS — J069 Acute upper respiratory infection, unspecified: Secondary | ICD-10-CM | POA: Diagnosis present

## 2021-10-08 DIAGNOSIS — Z20822 Contact with and (suspected) exposure to covid-19: Secondary | ICD-10-CM | POA: Insufficient documentation

## 2021-10-08 LAB — RESP PANEL BY RT-PCR (FLU A&B, COVID) ARPGX2
Influenza A by PCR: NEGATIVE
Influenza B by PCR: NEGATIVE
SARS Coronavirus 2 by RT PCR: NEGATIVE

## 2021-10-08 MED ORDER — CHERATUSSIN AC 100-10 MG/5ML PO SOLN: 5.0000 mL | Freq: Four times a day (QID) | ORAL | 0 refills | Status: DC | PRN

## 2021-10-08 NOTE — ED Triage Notes (Signed)
Pt had cough, not feeling well and unable to sleep that is dry since Tuesday.  Had flu shot last week.

## 2021-10-08 NOTE — ED Provider Notes (Signed)
[place]    CSN: [id number] Arrival date & time: 10/08/21  6301      History   Chief Complaint Chief Complaint  Patient presents with   Cough    HPI Richard Wilson is a 71 y.o. male.    Cough  Here with a 2-day history of cough and nasal congestion.  He has had a lot of malaise but no shortness of breath.  No nausea, vomiting, or diarrhea.  No sore throat or ear pain.  He did have a flu shot last week, but no known exposures.  He has had COVID boosters  Past Medical History:  Diagnosis Date   Arthritis of knee 08/31/2017   Chronic idiopathic constipation 10/23/2020   Chronic kidney disease, stage 3a 60/11/9321   Diastolic dysfunction 55/73/2202   Diverticulosis of large intestine without hemorrhage 09/30/2013   Elevated PSA 12/22/2012   Erectile dysfunction 10/23/2020   Generalized anxiety disorder 01/01/2020   Hyperglycemia 09/24/2020   Intention tremor    Major depressive disorder 01/01/2020   Mild cognitive impairment of uncertain or unknown etiology 01/21/2021   Mild mitral regurgitation 08/15/2013   Nephrolithiasis 06/22/2016   Obstructive sleep apnea 04/13/2015   Orthostatic lightheadedness 10/23/2020   Primary osteoarthritis of both hips 07/30/2019   Pure hypercholesterolemia 12/15/2018   RBBB (right bundle branch block) 12/03/2012   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   Thrombocytopenia 04/08/2020    Patient Active Problem List   Diagnosis Date Noted   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   Mild cognitive impairment of uncertain or unknown etiology 01/21/2021   Intention tremor    Chronic idiopathic constipation 10/23/2020   Orthostatic lightheadedness 10/23/2020   Erectile dysfunction 10/23/2020   Hyperglycemia 09/24/2020   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   Thrombocytopenia 04/08/2020   Generalized anxiety disorder 01/01/2020   Major depressive disorder 01/01/2020   Primary osteoarthritis of both hips  07/30/2019   Chronic kidney disease, stage 3a 03/19/2019   Hyperlipidemia 12/15/2018   Arthritis of knee 08/31/2017   Nephrolithiasis 06/22/2016   Obstructive sleep apnea 04/13/2015   Diverticulosis of large intestine without hemorrhage 09/30/2013   Mild mitral regurgitation 54/27/0623   Diastolic dysfunction 76/28/3151   Elevated PSA 12/22/2012   RBBB (right bundle branch block) 12/03/2012    Past Surgical History:  Procedure Laterality Date   APPENDECTOMY     FOOT TENDON SURGERY Right    TONSILLECTOMY         Home Medications    Prior to Admission medications   Medication Sig Start Date End Date Taking? Authorizing Provider  guaiFENesin-codeine (CHERATUSSIN AC) 100-10 MG/5ML syrup Take 5 mLs by mouth 4 (four) times daily as needed for cough. 10/08/21  Yes Barrett Henle, MD  amitriptyline (ELAVIL) 25 MG tablet Take 1 tablet (25 mg total) by mouth at bedtime. 08/10/21   Donnal Moat T, PA-C  clonazePAM (KLONOPIN) 1 MG tablet Take 1 tablet (1 mg total) by mouth 3 (three) times daily as needed for anxiety. 09/08/21   Donnal Moat T, PA-C  fluvoxaMINE (LUVOX) 100 MG tablet Take 2 tablets (200 mg total) by mouth at bedtime. 09/24/21   Donnal Moat T, PA-C  hydrocortisone 2.5 % ointment Apply topically daily. Use once a day on eyebrows- dermatitis    [provider]  ibuprofen (ADVIL) 800 MG tablet Take 1 tablet (800 mg total) by mouth every 8 (eight) hours as needed. 09/25/21   Leandrew Koyanagi, MD  lamoTRIgine (LAMICTAL) 100 MG tablet Take 1 tablet (  100 mg total) by mouth daily. 08/18/21   Addison Lank, PA-C  meloxicam (MOBIC) 7.5 MG tablet Take 1 tablet (7.5 mg total) by mouth daily. Take until pain has resolved.  Take with food 09/07/21   Rita Ohara, MD  Probiotic Product (PROBIOTIC-10) CHEW Chew by mouth.    [provider]  psyllium (METAMUCIL) 58.6 % powder Take 1 packet by mouth in the morning and at bedtime.    [provider]  sildenafil (VIAGRA) 100  MG tablet Take 1 tablet (100 mg total) by mouth daily as needed for erectile dysfunction. 08/10/21   Donnal Moat T, PA-C  tiZANidine (ZANAFLEX) 4 MG tablet Take 1 tablet (4 mg total) by mouth 2 (two) times daily as needed for muscle spasms. Patient not taking: Reported on 09/08/2021 09/03/21   Tysinger, Camelia Eng, PA-C  vitamin B-12 (CYANOCOBALAMIN) 1000 MCG tablet Take 1,000 mcg by mouth daily.    [provider]    Family History Family History  Problem Relation Age of Onset   Cancer Mother        Ovarian   Heart disease Mother    Memory loss Mother        with advanced age   Prostate cancer Father    Throat cancer Maternal Grandfather    Heart attack Paternal Grandfather    Cancer Maternal Aunt        Breast, metastatic    Social History Social History   Tobacco Use   Smoking status: Never   Smokeless tobacco: Never  Vaping Use   Vaping Use: Never used  Substance Use Topics   Alcohol use: Not Currently    Comment: occasional glass of wine   Drug use: Never     Allergies   Garamycin [gentamicin] and Penicillins   Review of Systems Review of Systems  Respiratory:  Positive for cough.      Physical Exam Triage Vital Signs ED Triage Vitals  Enc Vitals Group     BP 10/08/21 1910 (!) 148/84     Pulse Rate 10/08/21 1910 98     Resp 10/08/21 1910 20     Temp 10/08/21 1910 98.1 F (36.7 C)     Temp Source 10/08/21 1910 Oral     SpO2 10/08/21 1910 96 %     Weight --      Height --      Head Circumference --      Peak Flow --      Pain Score 10/08/21 1909 0     Pain Loc --      Pain Edu? --      Excl. in [place]? --    No data found.  Updated Vital Signs BP (!) 148/84 (BP Location: Right Arm)   Pulse 98   Temp 98.1 F (36.7 C) (Oral)   Resp 20   SpO2 96%   Visual Acuity Right Eye Distance:   Left Eye Distance:   Bilateral Distance:    Right Eye Near:   Left Eye Near:    Bilateral Near:     Physical Exam Vitals reviewed.  Constitutional:       General: He is not in acute distress.    Appearance: He is not toxic-appearing.  HENT:     Right Ear: Tympanic membrane and ear canal normal.     Left Ear: Tympanic membrane and ear canal normal.     Nose: Nose normal.     Mouth/Throat:     Mouth: Mucous  membranes are moist.     Pharynx: No oropharyngeal exudate or posterior oropharyngeal erythema.  Eyes:     Extraocular Movements: Extraocular movements intact.     Conjunctiva/sclera: Conjunctivae normal.     Pupils: Pupils are equal, round, and reactive to light.  Cardiovascular:     Rate and Rhythm: Normal rate and regular rhythm.     Heart sounds: No murmur heard. Pulmonary:     Effort: Pulmonary effort is normal. No respiratory distress.     Breath sounds: No stridor. No wheezing, rhonchi or rales.  Musculoskeletal:     Cervical back: Neck supple.  Lymphadenopathy:     Cervical: No cervical adenopathy.  Skin:    Capillary Refill: Capillary refill takes less than 2 seconds.     Coloration: Skin is not jaundiced or pale.  Neurological:     General: No focal deficit present.     Mental Status: He is alert and oriented to person, place, and time.  Psychiatric:        Behavior: Behavior normal.      UC Treatments / Results  Labs (all labs ordered are listed, but only abnormal results are displayed) Labs Reviewed  RESP PANEL BY RT-PCR (FLU A&B, COVID) [id number]    EKG   Radiology No results found.  Procedures Procedures (including critical care time)  Medications Ordered in UC Medications - No data to display  Initial Impression / Assessment and Plan / UC Course  I have reviewed the triage vital signs and the nursing notes.  Pertinent labs & imaging results that were available during my care of the patient were reviewed by me and considered in my medical decision making (see chart for details).     He is swabbed for COVID and we will also swab for flu since he had the shot last week.  We will notify him if  positive, and he should have a prescription for Paxlovid with renal dosing.  His last EGFR was 48.      Final Clinical Impressions(s) / UC Diagnoses   Final diagnoses:  Viral URI with cough     Discharge Instructions       You have been swabbed for COVID and flu, and the test will result in the next 24 hours. Our staff will call you if positive. If the COVID test is positive, you should quarantine for 5 days from the start of your symptoms  Robitussin with codeine cough syrup--take 1 teaspoon or 5 mL every 6 hours as needed for cough      ED Prescriptions     Medication Sig Dispense Auth. Provider   guaiFENesin-codeine (CHERATUSSIN AC) 100-10 MG/5ML syrup Take 5 mLs by mouth 4 (four) times daily as needed for cough. 120 mL Barrett Henle, MD      I have reviewed the PDMP during this encounter.   Barrett Henle, MD 10/08/21 [phone]

## 2021-10-08 NOTE — Discharge Instructions (Addendum)
  You have been swabbed for COVID and flu, and the test will result in the next 24 hours. Our staff will call you if positive. If the COVID test is positive, you should quarantine for 5 days from the start of your symptoms  Robitussin with codeine cough syrup--take 1 teaspoon or 5 mL every 6 hours as needed for cough

## 2021-10-09 ENCOUNTER — Encounter: Payer: Self-pay | Admitting: Family Medicine

## 2021-10-09 ENCOUNTER — Ambulatory Visit (INDEPENDENT_AMBULATORY_CARE_PROVIDER_SITE_OTHER): Payer: Medicare Other | Admitting: Family Medicine

## 2021-10-09 VITALS — BP 108/72 | HR 96 | Temp 97.7°F | Wt 190.0 lb

## 2021-10-09 DIAGNOSIS — R051 Acute cough: Secondary | ICD-10-CM

## 2021-10-09 MED ORDER — HYDROCOD POLI-CHLORPHE POLI ER 10-8 MG/5ML PO SUER: 5.0000 mL | Freq: Two times a day (BID) | ORAL | 0 refills | Status: DC | PRN

## 2021-10-09 NOTE — Progress Notes (Signed)
   Subjective:    Patient ID: Richard Wilson, male    DOB: 03/14/1950, 71 y.o.   MRN: [id number]  HPI He states that on Tuesday he had the onset of cough, myalgias, fatigue and slight rhinorrhea.  On Thursday he went to an urgent care center.  COVID and flu test was negative.  He was instructed to use a cough med but he states it did not help.  No sore throat, earache, shortness of breath.   Review of Systems     Objective:   Physical Exam Alert and in no distress. Tympanic membranes and canals are normal. Pharyngeal area is normal. Neck is supple without adenopathy or thyromegaly. Cardiac exam shows a regular sinus rhythm without murmurs or gallops. Lungs are clear to auscultation.        Assessment & Plan:

## 2021-10-14 ENCOUNTER — Telehealth: Payer: Self-pay | Admitting: Family Medicine

## 2021-10-14 NOTE — Telephone Encounter (Signed)
Oral Surgery Referral

## 2021-10-16 ENCOUNTER — Ambulatory Visit (INDEPENDENT_AMBULATORY_CARE_PROVIDER_SITE_OTHER): Payer: Medicare Other | Admitting: Psychiatry

## 2021-10-16 DIAGNOSIS — G3184 Mild cognitive impairment, so stated: Secondary | ICD-10-CM

## 2021-10-16 DIAGNOSIS — F411 Generalized anxiety disorder: Secondary | ICD-10-CM | POA: Diagnosis not present

## 2021-10-16 DIAGNOSIS — F331 Major depressive disorder, recurrent, moderate: Secondary | ICD-10-CM | POA: Diagnosis not present

## 2021-10-16 DIAGNOSIS — F4010 Social phobia, unspecified: Secondary | ICD-10-CM | POA: Diagnosis not present

## 2021-10-16 DIAGNOSIS — Z79899 Other long term (current) drug therapy: Secondary | ICD-10-CM

## 2021-10-16 DIAGNOSIS — R69 Illness, unspecified: Secondary | ICD-10-CM

## 2021-10-16 DIAGNOSIS — G479 Sleep disorder, unspecified: Secondary | ICD-10-CM

## 2021-10-16 NOTE — Progress Notes (Unsigned)
Psychotherapy Progress Note [facility] [name], PhD LP  Patient ID: Hawkin [name]    MRN: [id number] Therapy format: Individual psychotherapy Date: 10/16/2021      Start: 10:16a     Stop: 11:04a     Time Spent: 48 min Location: In-person   Session narrative (presenting needs, interim history, self-report of stressors and symptoms, applications of prior therapy, status changes, and interventions made in session) Dismayed to find checkbook screwed up again.  Got so aggravated he stayed up to 4am one night banging his head on the math, cycling through anxiety and self-recriminations.  Coincided with trying to eliminate midday Klonopin, and going through the aggravation of trying to return a purchase at [facility] and being disallowed from exchanging (?) for an upgraded item.  Guided to drop shaming himself and recognize that it took a combination of factors to get in the weeds again with money management, and affirmed actions already taken, including resolving to get in touch with the bank, get a fresh balance, and cut losses trying to figure it out.  Still positive about the priests at [facility], very approachable.  Seems to be more like his likely church home going forward.  Going to midday mass today, in fact, and resolved not to make any premature offers of service, just enjoy worship and if moved seek an audience.  Knows he wants most to get hold of financial accuracy and nutrition.  In addition to seeing banker, which needed encouragement to prevent conclusion-jumping, discussed a few easy, nutritious meal ideas and promoted meal planning.  Therapeutic modalities: Cognitive Behavioral Therapy, Solution-Oriented/Positive Psychology, and Ego-Supportive  Mental Status/Observations:  Appearance:   Casual and Neat     Behavior:  Appropriate  Motor:  Normal  Speech/Language:   Clear and Coherent and less impressionistic  Affect:  Appropriate and  nonlabile, nonhistrionic  Mood:  anxious, dysthymic, and more responsive and hopeful  Thought process:  normal and exc conclusion-jumping  Thought content:    WNL and worry  Sensory/Perceptual disturbances:    WNL  Orientation:  Fully oriented  Attention:  Good    Concentration:  Fair  Memory:  grossly intact  Insight:    Good  Judgment:   Good  Impulse Control:  Fair   Risk Assessment: Danger to Self: No Self-injurious Behavior: No Danger to Others: No Physical Aggression / Violence: No Duty to Warn: No Access to Firearms a concern: No  Assessment of progress:  progressing  Diagnosis:   ICD-10-CM   1. Generalized anxiety disorder  F41.1     2. Long-term current use of benzodiazepine  Z79.899     3. Social anxiety disorder  F40.10     4. Major depressive disorder, recurrent episode, moderate (HCC)  F33.1     5. Mild cognitive impairment of uncertain or unknown etiology -- suspect combination of nutritional deficiency, anxiety, sedative effects, and dyssomnia with possible apnea  G31.84     6. 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 r/o MCI, predementia vs. 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  Endorse weaning, but try much more gradually, e.g., 1/4 mg at a time, starting with midday or morning, grade out from there.  Seek psychiatry's advice and partnership on the schedule for weaning. Social connections and morale -- Check with [place] group.  Free choice  about church, just stay clear he is free to assess, free to dig in a while, and free to decide if/when to offer his work in [employer] when truly ready, not compelled by overwrought sense of duty.  Consider reinstating Juliann Pulse as his next-of-kin, possibly POA. Financial organization -- Continue banking/arithmetic measures, including use of calculator and willingness to fresh start with the bank's balance.  If not yet,  should approach bank about his annuity, may be able to safely increase withdrawals ahead of expected housing cost hike. Medication organization -- Use a standard pillbox to organize meds rather than rely on daily concentration and organization, which vary Nutrition -- Prefer Cerefolin to simple B12 based on history and assessment, but understand prohibitive cost.  Still recommend OTC B complex as preferable to B12 only, given indications of broader nutritional issues.  Maintain varied food sources, try to improve protein content, regulate vitamin supplementation, restrain late carbs.  Continue improved vegetable content but go for reduced-sodium if canned juice. Sleep hygiene -- maintain reasonably regular, timely bedtime, have electronics curfew.  Accept rest rather than sleep when not coming easy. Mouth and hand pain problems -- Address with approved OTC, Meloxicam, and PT per PCP's advice.  Make sure to comprehend referrals and instructions rather than let uncomfortable feelings drive irrational conclusion-jumping.  Maintain hydration to combat dry mouth and prevent any suspected salivary gland inflammation. PCP relationship and medical organization -- Work with PCP in good faith to fully inform and ask necessary questions, fully comprehend medical advice, written instructions may help.  Care coordination with psychiatry and PCP ad lib. Regret/shame -- review reframes, reaffirm the good he tries to do, partial successes, good efforts, permission to learn Other recommendations/advice as may be noted above Continue to utilize previously learned skills ad lib Maintain medication as prescribed and work faithfully with relevant prescriber(s) if any changes are desired or seem indicated Call the clinic on-call service, 988/hotline, 911, or present to [facility] or ER if any life-threatening psychiatric crisis Return for session(s) already scheduled. Already scheduled visit in this office 10/27/2021.  Blanchie Serve, PhD [name], PhD LP Clinical Psychologist, [facility] [address], [place], [place] [address] [phone]

## 2021-10-20 ENCOUNTER — Ambulatory Visit: Payer: Medicare Other | Admitting: Physician Assistant

## 2021-10-25 ENCOUNTER — Ambulatory Visit (HOSPITAL_COMMUNITY)
Admission: EM | Admit: 2021-10-25 | Discharge: 2021-10-25 | Disposition: A | Discharge status: Home / Self Care | Payer: Medicare Other | Attending: Emergency Medicine | Admitting: Emergency Medicine

## 2021-10-25 ENCOUNTER — Encounter (HOSPITAL_COMMUNITY): Payer: Self-pay | Admitting: *Deleted

## 2021-10-25 ENCOUNTER — Ambulatory Visit (INDEPENDENT_AMBULATORY_CARE_PROVIDER_SITE_OTHER): Payer: Medicare Other

## 2021-10-25 DIAGNOSIS — S39012A Strain of muscle, fascia and tendon of lower back, initial encounter: Secondary | ICD-10-CM

## 2021-10-25 DIAGNOSIS — M545 Low back pain, unspecified: Secondary | ICD-10-CM | POA: Diagnosis not present

## 2021-10-25 DIAGNOSIS — M62830 Muscle spasm of back: Secondary | ICD-10-CM | POA: Diagnosis not present

## 2021-10-25 MED ORDER — CYCLOBENZAPRINE HCL 5 MG PO TABS: 5.0000 mg | ORAL_TABLET | Freq: Two times a day (BID) | ORAL | 0 refills | Status: AC | PRN

## 2021-10-25 MED ORDER — LIDOCAINE 5 % EX PTCH: 1.0000 | MEDICATED_PATCH | CUTANEOUS | 0 refills | Status: DC

## 2021-10-25 MED ORDER — ACETAMINOPHEN 325 MG PO TABS
ORAL_TABLET | ORAL | Status: AC
  Filled 2021-10-25: qty 2

## 2021-10-25 MED ORDER — ACETAMINOPHEN 325 MG PO TABS
650.0000 mg | ORAL_TABLET | Freq: Once | ORAL | Status: AC
  Administered 2021-10-25: 650 mg via ORAL

## 2021-10-25 NOTE — ED Triage Notes (Signed)
Pt reports back pain started yesterday . Pt has taken tylenol with out relief. Pt reports back pain worse when moving and getting out of chair.

## 2021-10-25 NOTE — Discharge Instructions (Addendum)
I recommend using tylenol every 6 hours as needed for pain. You can take the muscle relaxer up to two times daily. If this medicine makes you drowsy, take only at bedtime. This can help with muscle spasm.  You can try the lidocaine patches on the low back to decrease pain and improve movement.  You may need to try this combination of therapies consistently over the next few days until pain resolves.   Please go to the emergency department if symptoms worsen.

## 2021-10-25 NOTE — ED Provider Notes (Signed)
[place]    CSN: [id number] Arrival date & time: 10/25/21  1006      History   Chief Complaint Chief Complaint  Patient presents with   Back Pain    HPI Richard Wilson is a 71 y.o. male.  Presents with low back pain that began yesterday morning. Reports he was moving a box when he developed pain and spasm Reports 10/10 pain this morning, worse with moving Feels it when standings straight up  He took Tylenol last night that resolved his pain, but pain returned when medication wore off. No medication taken today. Tried hot pad  Denies any bowel or bladder symptoms No numbness/tingling or weakness No history of back injury  Past Medical History:  Diagnosis Date   Arthritis of knee 08/31/2017   Chronic idiopathic constipation 10/23/2020   Chronic kidney disease, stage 3a 58/10/9831   Diastolic dysfunction 82/50/5397   Diverticulosis of large intestine without hemorrhage 09/30/2013   Elevated PSA 12/22/2012   Erectile dysfunction 10/23/2020   Generalized anxiety disorder 01/01/2020   Hyperglycemia 09/24/2020   Intention tremor    Major depressive disorder 01/01/2020   Mild cognitive impairment of uncertain or unknown etiology 01/21/2021   Mild mitral regurgitation 08/15/2013   Nephrolithiasis 06/22/2016   Obstructive sleep apnea 04/13/2015   Orthostatic lightheadedness 10/23/2020   Primary osteoarthritis of both hips 07/30/2019   Pure hypercholesterolemia 12/15/2018   RBBB (right bundle branch block) 12/03/2012   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   Thrombocytopenia 04/08/2020    Patient Active Problem List   Diagnosis Date Noted   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   Mild cognitive impairment of uncertain or unknown etiology 01/21/2021   Intention tremor    Chronic idiopathic constipation 10/23/2020   Orthostatic lightheadedness 10/23/2020   Erectile dysfunction 10/23/2020   Hyperglycemia 09/24/2020   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   Thrombocytopenia 04/08/2020   Generalized anxiety disorder 01/01/2020   Major depressive disorder 01/01/2020   Primary osteoarthritis of both hips 07/30/2019   Chronic kidney disease, stage 3a 03/19/2019   Hyperlipidemia 12/15/2018   Arthritis of knee 08/31/2017   Nephrolithiasis 06/22/2016   Obstructive sleep apnea 04/13/2015   Diverticulosis of large intestine without hemorrhage 09/30/2013   Mild mitral regurgitation 67/34/1937   Diastolic dysfunction 90/24/0973   Elevated PSA 12/22/2012   RBBB (right bundle branch block) 12/03/2012    Past Surgical History:  Procedure Laterality Date   APPENDECTOMY     FOOT TENDON SURGERY Right    TONSILLECTOMY         Home Medications    Prior to Admission medications   Medication Sig Start Date End Date Taking? Authorizing Provider  cyclobenzaprine (FLEXERIL) 5 MG tablet Take 1 tablet (5 mg total) by mouth 2 (two) times daily as needed for up to 5 days for muscle spasms. 10/25/21 10/30/21 Yes Dezaria Methot, PA-C  lidocaine (LIDODERM) 5 % Place 1 patch onto the skin daily. Remove & Discard patch within 12 hours 10/25/21  Yes Crystian Frith, Wells Guiles, PA-C  amitriptyline (ELAVIL) 25 MG tablet Take 1 tablet (25 mg total) by mouth at bedtime. 08/10/21   Addison Lank, PA-C  chlorpheniramine-HYDROcodone (TUSSIONEX) 10-8 MG/5ML Take 5 mLs by mouth every 12 (twelve) hours as needed for cough. 10/09/21   Denita Lung, MD  clonazePAM (KLONOPIN) 1 MG tablet Take 1 tablet (1 mg total) by mouth 3 (three) times daily as needed for anxiety. 09/08/21   Donnal Moat T, PA-C  fluvoxaMINE (LUVOX) 100 MG tablet  Take 2 tablets (200 mg total) by mouth at bedtime. 09/24/21   Donnal Moat T, PA-C  hydrocortisone 2.5 % ointment Apply topically daily. Use once a day on eyebrows- dermatitis    [provider]  lamoTRIgine (LAMICTAL) 100 MG tablet Take 1 tablet (100 mg total) by mouth daily. 08/18/21   Addison Lank, PA-C  Probiotic Product  (PROBIOTIC-10) CHEW Chew by mouth.    [provider]  psyllium (METAMUCIL) 58.6 % powder Take 1 packet by mouth in the morning and at bedtime.    [provider]  vitamin B-12 (CYANOCOBALAMIN) 1000 MCG tablet Take 1,000 mcg by mouth daily.    [provider]    Family History Family History  Problem Relation Age of Onset   Cancer Mother        Ovarian   Heart disease Mother    Memory loss Mother        with advanced age   Prostate cancer Father    Throat cancer Maternal Grandfather    Heart attack Paternal Grandfather    Cancer Maternal Aunt        Breast, metastatic    Social History Social History   Tobacco Use   Smoking status: Never   Smokeless tobacco: Never  Vaping Use   Vaping Use: Never used  Substance Use Topics   Alcohol use: Not Currently    Comment: occasional glass of wine   Drug use: Never     Allergies   Garamycin [gentamicin] and Penicillins   Review of Systems Review of Systems  Musculoskeletal:  Positive for back pain.     Physical Exam Triage Vital Signs ED Triage Vitals  Enc Vitals Group     BP 10/25/21 1027 128/87     Pulse Rate 10/25/21 1027 95     Resp 10/25/21 1027 18     Temp 10/25/21 1027 98.7 F (37.1 C)     Temp src --      SpO2 10/25/21 1027 95 %     Weight --      Height --      Head Circumference --      Peak Flow --      Pain Score 10/25/21 1026 10     Pain Loc --      Pain Edu? --      Excl. in [place]? --    No data found.  Updated Vital Signs BP 128/87   Pulse 95   Temp 98.7 F (37.1 C)   Resp 18   SpO2 95%     Physical Exam Vitals and nursing note reviewed.  Constitutional:      Comments: Standing in exam room, slightly bent over  HENT:     Mouth/Throat:     Pharynx: Oropharynx is clear.  Eyes:     Conjunctiva/sclera: Conjunctivae normal.  Cardiovascular:     Rate and Rhythm: Normal rate and regular rhythm.     Pulses: Normal pulses.     Heart sounds: Normal heart sounds.   Pulmonary:     Effort: Pulmonary effort is normal.     Breath sounds: Normal breath sounds.  Musculoskeletal:        General: Tenderness present. No swelling or deformity.     Cervical back: Normal range of motion.     Comments: Some tenderness/spasm low lumbar spine. No bony tenderness. Decreased ROM due to pain. Gait intact  Skin:    General: Skin is warm and dry.  Neurological:  Mental Status: He is oriented to person, place, and time.     Motor: No weakness.     UC Treatments / Results  Labs (all labs ordered are listed, but only abnormal results are displayed) Labs Reviewed - No data to display  EKG   Radiology DG Lumbar Spine Complete  Result Date: 10/25/2021 CLINICAL DATA:  Acute low back pain following injury yesterday. Initial encounter. EXAM: LUMBAR SPINE - COMPLETE 4+ VIEW COMPARISON:  None Available. FINDINGS: Five non rib-bearing lumbar type vertebra are identified in normal alignment. There is no evidence of acute fracture or subluxation. Mild-to-moderate degenerative disc disease, spondylosis, and moderate facet arthropathy throughout the lumbar spine identified. There is no evidence of spondylolysis or focal bony lesion. IMPRESSION: 1. No evidence of acute abnormality. 2. Mild-to-moderate degenerative changes throughout the lumbar spine. Electronically Signed   By: Margarette Canada M.D.   On: 10/25/2021 11:47    Procedures Procedures (including critical care time)  Medications Ordered in UC Medications  acetaminophen (TYLENOL) tablet 650 mg (650 mg Oral Given by Other 10/25/21 1139)    Initial Impression / Assessment and Plan / UC Course  I have reviewed the triage vital signs and the nursing notes.  Pertinent labs & imaging results that were available during my care of the patient were reviewed by me and considered in my medical decision making (see chart for details).  Likely muscular given MOI and spasm Reassuring that tylenol improved pain.  No neuro or  bowel/bladder symptoms, no other red flags  Tylenol dose given. Lumbar spine xray negative for acute abnormality. Degenerative changes lumbar spine.  Discussed use of tylenol every 4-6 hours as needed, with hot pad if this helps Lidocaine patches for low back Muscle relaxer low dose 2x daily as needed, side effects discussed Return precautions discussed. Patient agrees to plan  Final Clinical Impressions(s) / UC Diagnoses   Final diagnoses:  Muscle spasm of back  Strain of lumbar region, initial encounter     Discharge Instructions      I recommend using tylenol every 6 hours as needed for pain. You can take the muscle relaxer up to two times daily. If this medicine makes you drowsy, take only at bedtime. This can help with muscle spasm.  You can try the lidocaine patches on the low back to decrease pain and improve movement.  You may need to try this combination of therapies consistently over the next few days until pain resolves.   Please go to the emergency department if symptoms worsen.     ED Prescriptions     Medication Sig Dispense Auth. Provider   cyclobenzaprine (FLEXERIL) 5 MG tablet Take 1 tablet (5 mg total) by mouth 2 (two) times daily as needed for up to 5 days for muscle spasms. 10 tablet Bleu Minerd, PA-C   lidocaine (LIDODERM) 5 % Place 1 patch onto the skin daily. Remove & Discard patch within 12 hours 15 patch Marcella Dunnaway, Wells Guiles, PA-C      PDMP not reviewed this encounter.   Rudolfo Brandow, Vernice Jefferson 10/25/21 1202

## 2021-10-26 ENCOUNTER — Ambulatory Visit (INDEPENDENT_AMBULATORY_CARE_PROVIDER_SITE_OTHER): Payer: Medicare Other | Admitting: Family Medicine

## 2021-10-26 ENCOUNTER — Encounter: Payer: Self-pay | Admitting: Family Medicine

## 2021-10-26 VITALS — BP 124/82 | HR 93 | Temp 98.3°F | Wt 190.6 lb

## 2021-10-26 DIAGNOSIS — M533 Sacrococcygeal disorders, not elsewhere classified: Secondary | ICD-10-CM

## 2021-10-26 NOTE — Progress Notes (Signed)
   Subjective:    Patient ID: Richard Wilson, male    DOB: 03-Jun-1950, 71 y.o.   MRN: [id number]  HPI He states that Saturday while moving something he performed a labral motion and developed some left-sided low back pain.  He was seen in an urgent care center and given Flexeril and told to take Tylenol which she apparently did not take much of.  He is here for follow-up.   Review of Systems     Objective:   Physical Exam Alert and complaining of back pain.  Tender to palpation in the left SI joint area.  Splinting noted well flexing the lumbar area.  Negative straight leg raising and normal hip motion.       Assessment & Plan:  Sacroiliac pain I explained where his problem list and recommended using the Flexeril mainly at night but taking the Tylenol 2 tablets 4 times per day as well as NSAID of choice and using heat.  He will keep me informed as to how he is doing.

## 2021-10-26 NOTE — Patient Instructions (Signed)
Use of Flexeril mainly at night to help with sleep.  During the day take 2 Tylenol 4 times per day and also take as many as 4 Advil 3 times per day.  Heat to your back for 20 minutes 3 times per day

## 2021-10-27 ENCOUNTER — Ambulatory Visit: Payer: Medicare Other | Admitting: Psychiatry

## 2021-10-28 ENCOUNTER — Ambulatory Visit (INDEPENDENT_AMBULATORY_CARE_PROVIDER_SITE_OTHER): Payer: Medicare Other | Admitting: Family Medicine

## 2021-10-28 ENCOUNTER — Encounter: Payer: Self-pay | Admitting: Family Medicine

## 2021-10-28 VITALS — BP 124/82 | HR 78 | Temp 96.8°F | Wt 194.0 lb

## 2021-10-28 DIAGNOSIS — G20 Parkinson's disease: Secondary | ICD-10-CM

## 2021-10-28 DIAGNOSIS — G3184 Mild cognitive impairment, so stated: Secondary | ICD-10-CM | POA: Diagnosis not present

## 2021-10-28 NOTE — Progress Notes (Signed)
   Subjective:    Patient ID: Richard Wilson, male    DOB: 1950/07/11, 71 y.o.   MRN: [id number]  HPI He is here for consult concerning forgetfulness and word searching.  Review of his record indicates he has been seen by neurology and the diagnosis of Parkinson's disease as well as tremor was entertained.  He was supposed to get a follow-up in 6 months however he does not remember that.  He drinks 2 or 3 drinks several times per week.  Review of the record indicates his thyroid function is normal.  He continues on his psychotropic medications.   Review of Systems     Objective:   Physical Exam Alert and in no distress.  Oriented x3.  EOMI.  Cardiac and lung exam is normal. Note from neurology was reviewed.  Lab data also reviewed.      Assessment & Plan:  Mild cognitive impairment of uncertain or unknown etiology - Plan: Vitamin B12, RPR  Parkinson's disease I explained that since he is already seen neurology, follow-up appointment there would be appropriate.  I will do some screening on his B12 level since it was slightly low and also check RPR to eliminate that as a possible cause of this.  Explained that the psychotropic meds that he is on and his alcohol can play a role in the cognitive dysfunction. I also discussed the fact that since he has already been evaluated there, appropriate follow-up to compare his previous evaluation with the 1 he will get in December would be very appropriate.

## 2021-10-29 LAB — RPR: RPR Ser Ql: NONREACTIVE

## 2021-10-29 LAB — VITAMIN B12: Vitamin B-12: 896 pg/mL (ref 232–1245)

## 2021-10-30 ENCOUNTER — Ambulatory Visit: Payer: Medicare Other | Admitting: Psychiatry

## 2021-11-02 ENCOUNTER — Other Ambulatory Visit: Payer: Self-pay

## 2021-11-02 ENCOUNTER — Ambulatory Visit (INDEPENDENT_AMBULATORY_CARE_PROVIDER_SITE_OTHER): Payer: Medicare Other | Admitting: Psychiatry

## 2021-11-02 ENCOUNTER — Encounter (HOSPITAL_COMMUNITY): Payer: Self-pay

## 2021-11-02 ENCOUNTER — Emergency Department (HOSPITAL_COMMUNITY)
Admission: EM | Admit: 2021-11-02 | Discharge: 2021-11-02 | Disposition: A | Discharge status: Home / Self Care | Payer: Medicare Other | Attending: Emergency Medicine | Admitting: Emergency Medicine

## 2021-11-02 DIAGNOSIS — Z79899 Other long term (current) drug therapy: Secondary | ICD-10-CM | POA: Diagnosis not present

## 2021-11-02 DIAGNOSIS — F411 Generalized anxiety disorder: Secondary | ICD-10-CM

## 2021-11-02 DIAGNOSIS — Z91148 Patient's other noncompliance with medication regimen for other reason: Secondary | ICD-10-CM

## 2021-11-02 DIAGNOSIS — R42 Dizziness and giddiness: Secondary | ICD-10-CM

## 2021-11-02 DIAGNOSIS — F331 Major depressive disorder, recurrent, moderate: Secondary | ICD-10-CM

## 2021-11-02 DIAGNOSIS — I959 Hypotension, unspecified: Secondary | ICD-10-CM | POA: Insufficient documentation

## 2021-11-02 DIAGNOSIS — R638 Other symptoms and signs concerning food and fluid intake: Secondary | ICD-10-CM

## 2021-11-02 DIAGNOSIS — G3184 Mild cognitive impairment, so stated: Secondary | ICD-10-CM | POA: Diagnosis not present

## 2021-11-02 DIAGNOSIS — R69 Illness, unspecified: Secondary | ICD-10-CM

## 2021-11-02 LAB — COMPREHENSIVE METABOLIC PANEL
ALT: 34 U/L (ref 0–44)
AST: 22 U/L (ref 15–41)
Albumin: 3.4 g/dL — ABNORMAL LOW (ref 3.5–5.0)
Alkaline Phosphatase: 102 U/L (ref 38–126)
Anion gap: 4 — ABNORMAL LOW (ref 5–15)
BUN: 18 mg/dL (ref 8–23)
CO2: 25 mmol/L (ref 22–32)
Calcium: 8.7 mg/dL — ABNORMAL LOW (ref 8.9–10.3)
Chloride: 110 mmol/L (ref 98–111)
Creatinine, Ser: 1.24 mg/dL (ref 0.61–1.24)
GFR, Estimated: >60 mL/min (ref >60)
Glucose, Bld: 145 mg/dL — ABNORMAL HIGH (ref 70–99)
Potassium: 4.2 mmol/L (ref 3.5–5.1)
Sodium: 139 mmol/L (ref 135–145)
Total Bilirubin: 0.7 mg/dL (ref 0.3–1.2)
Total Protein: 5.8 g/dL — ABNORMAL LOW (ref 6.5–8.1)

## 2021-11-02 LAB — CBC WITH DIFFERENTIAL/PLATELET
Abs Immature Granulocytes: 0.02 10*3/uL (ref 0.00–0.07)
Basophils Absolute: 0 10*3/uL (ref 0.0–0.1)
Basophils Relative: 1 %
Eosinophils Absolute: 0.2 10*3/uL (ref 0.0–0.5)
Eosinophils Relative: 4 %
HCT: 47.8 % (ref 39.0–52.0)
Hemoglobin: 15.6 g/dL (ref 13.0–17.0)
Immature Granulocytes: 0 %
Lymphocytes Relative: 20 %
Lymphs Abs: 1.1 10*3/uL (ref 0.7–4.0)
MCH: 28.3 pg (ref 26.0–34.0)
MCHC: 32.6 g/dL (ref 30.0–36.0)
MCV: 86.6 fL (ref 80.0–100.0)
Monocytes Absolute: 0.6 10*3/uL (ref 0.1–1.0)
Monocytes Relative: 11 %
Neutro Abs: 3.5 10*3/uL (ref 1.7–7.7)
Neutrophils Relative %: 64 %
Platelets: 145 10*3/uL — ABNORMAL LOW (ref 150–400)
RBC: 5.52 MIL/uL (ref 4.22–5.81)
RDW: 16.7 % — ABNORMAL HIGH (ref 11.5–15.5)
WBC: 5.5 10*3/uL (ref 4.0–10.5)
nRBC: 0 % (ref 0.0–0.2)

## 2021-11-02 LAB — ETHANOL: Alcohol, Ethyl (B): <10 mg/dL (ref <10)

## 2021-11-02 LAB — TROPONIN I (HIGH SENSITIVITY): Troponin I (High Sensitivity): 4 ng/L (ref <18)

## 2021-11-02 MED ORDER — SODIUM CHLORIDE 0.9 % IV BOLUS
1000.0000 mL | Freq: Once | INTRAVENOUS | Status: AC
  Administered 2021-11-02: 1000 mL via INTRAVENOUS

## 2021-11-02 NOTE — ED Provider Notes (Signed)
[name] Provider Note   CSN: [id number] Arrival date & time: 11/02/21  0415     History  Chief Complaint  Patient presents with   Hypotension    Richard Wilson is a 71 y.o. male.  Patient is a 71 year old male with past medical history of generalized anxiety disorder, major depressive disorder, chronic renal insufficiency, hyperlipidemia.  Patient presenting today with complaints of "feeling weird".  Patient states that he has been having some low back pain recently and was prescribed Flexeril by his primary doctor.  Yesterday evening, he took 2 of these tablets prior to going to bed.  He woke up in the night having a bad dream, then noticed his mouth had "no saliva".  The history is provided by the patient.       Home Medications Prior to Admission medications   Medication Sig Start Date End Date Taking? Authorizing Provider  amitriptyline (ELAVIL) 25 MG tablet Take 1 tablet (25 mg total) by mouth at bedtime. 08/10/21   Addison Lank, PA-C  chlorpheniramine-HYDROcodone (TUSSIONEX) 10-8 MG/5ML Take 5 mLs by mouth every 12 (twelve) hours as needed for cough. Patient not taking: Reported on 10/28/2021 10/09/21   Denita Lung, MD  clonazePAM (KLONOPIN) 1 MG tablet Take 1 tablet (1 mg total) by mouth 3 (three) times daily as needed for anxiety. 09/08/21   Donnal Moat T, PA-C  fluvoxaMINE (LUVOX) 100 MG tablet Take 2 tablets (200 mg total) by mouth at bedtime. 09/24/21   Donnal Moat T, PA-C  hydrocortisone 2.5 % ointment Apply topically daily. Use once a day on eyebrows- dermatitis    [provider]  lamoTRIgine (LAMICTAL) 100 MG tablet Take 1 tablet (100 mg total) by mouth daily. 08/18/21   Donnal Moat T, PA-C  lidocaine (LIDODERM) 5 % Place 1 patch onto the skin daily. Remove & Discard patch within 12 hours Patient not taking: Reported on 10/28/2021 10/25/21   Rising, Wells Guiles, PA-C  Probiotic Product (PROBIOTIC-10) CHEW Chew by mouth.     [provider]  psyllium (METAMUCIL) 58.6 % powder Take 1 packet by mouth in the morning and at bedtime.    [provider]  vitamin B-12 (CYANOCOBALAMIN) 1000 MCG tablet Take 1,000 mcg by mouth daily.    [provider]      Allergies    Garamycin [gentamicin] and Penicillins    Review of Systems   Review of Systems  Physical Exam Updated Vital Signs BP 96/75 (BP Location: Right Arm)   Pulse 69   Temp (!) 97.5 F (36.4 C) (Oral)   Resp 19   SpO2 94%  Physical Exam  ED Results / Procedures / Treatments   Labs (all labs ordered are listed, but only abnormal results are displayed) Labs Reviewed  COMPREHENSIVE METABOLIC PANEL  CBC WITH DIFFERENTIAL/PLATELET  ETHANOL  TROPONIN I (HIGH SENSITIVITY)    EKG EKG Interpretation  Date/Time:  Monday November 02 2021 05:22:05 EDT Ventricular Rate:  66 PR Interval:  151 QRS Duration: 152 QT Interval:  393 QTC Calculation: 412 R Axis:   57 Text Interpretation: Sinus rhythm Right bundle branch block Confirmed by Veryl Speak [phone]) on 11/02/2021 5:37:38 AM  Radiology No results found.  Procedures Procedures    Medications Ordered in ED Medications  sodium chloride 0.9 % bolus 1,000 mL (has no administration in time range)    ED Course/ Medical Decision Making/ A&P  Patient brought by EMS for evaluation of feeling generally unwell.  He reports  taking an extra dose of Flexeril this evening, then waking up having bad dreams and dry mouth.  I suspect that this is a side effect of the medication.  He did have an episode of hypotension for EMS, then was given normal saline and blood pressures have improved.  Patient's work-up shows no evidence for a cardiac etiology.  His EKG shows a right bundle branch block, but is otherwise unremarkable.  Troponin is negative.  CBC and metabolic panel are unremarkable.  Patient has been observed and has had no further issues that I feel warrant admission.  I  feels the patient can safely be discharged.  He has a doctor's appointment this morning at 10:00 and I have advised him to keep this appointment.  Final Clinical Impression(s) / ED Diagnoses Final diagnoses:  None    Rx / DC Orders ED Discharge Orders     None         Veryl Speak, MD 11/02/21 [phone]

## 2021-11-02 NOTE — Progress Notes (Signed)
Psychotherapy Progress Note [facility] Richard Moore, PhD LP  Patient ID: Richard Wilson [name]    MRN: [id number] Therapy format: Individual psychotherapy Date: 11/02/2021      Start: 10:20a     Stop: 10:55a     Time Spent: 35 min Location: In-person   Session narrative (presenting needs, interim history, self-report of stressors and symptoms, applications of prior therapy, status changes, and interventions made in session) Fresh off trip to the hospital [facility]), still wearing bracelet and electrodes.  Dry mouth, somnolent.  C/o dizziness, stumbling on waking.  Walked home from [facility], 90 minutes staggering.  ED note says he took 2 Flexeril before bed last night, apparently as an impromptu sleep aid, and has low BP (78/50).  Knows he took regular meds last night.  Is on a probiotic OTC now.  I suspect dehydration, offered water during session, consumed about 4 oz in half hour.  Acknowledges he's been forgetting midday Klonopin, and pain has been flaring up last week, couldn't reach PCP about pain, went out and bought rum to sleep through the pain (did not finish).  Was on 4 Advil TID for pain, and Tylenol, 3 pills TID, he thinks, beginning of last week, not lately.  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  B12 was fine 5 days ago, but he has MTHFR, unknown degree of problem.  Ethanol test this morning negative.  Begins to tell of going to church yesterday and offering that he would like to read a lesson.  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  Says he is realizing there isn't a magic pill that's going to make everything better.  Supportively confronted that there certainly are basic nursing issues that can make it a good deal worse, so hydration, air supply (possible apnea?), nutrition, and med regulation all still need to be  assured to get the health he wants.    All told, it's clear he is weary and dried out this morning, in more acute need of rest and fluids.  Assures he is competent to drive, lives close by, and will not be taking chances.    Therapeutic modalities: Cognitive Behavioral Therapy, Solution-Oriented/Positive Psychology, and Ego-Supportive  Mental Status/Observations:  Appearance:   Disheveled     Behavior:  Drowsy  Motor:  Normal  Speech/Language:   Slurred and improved with water  Affect:  Appropriate  Mood:  tired  Thought process:  concrete  Thought content:    WNL  Sensory/Perceptual disturbances:    WNL  Orientation:  grossly intact  Attention:  Good    Concentration:  Fair  Memory:  grossly intact  Insight:    Good  Judgment:   Fair  Impulse Control:  Fair   Risk Assessment: Danger to Self: No Self-injurious Behavior:  none intentional Danger to Others: No Physical Aggression / Violence: No Duty to Warn: No Access to Firearms a concern: No  Assessment of progress:  situational setback(s)  Diagnosis:   ICD-10-CM   1. Generalized anxiety disorder  F41.1     2. Mild cognitive impairment of uncertain or unknown etiology -- suspect combination of nutritional deficiency, anxiety, sedative effects, and dyssomnia with possible apnea  G31.84     3. Dehydration symptoms  R63.8     4. Variable compliance with medication and medical advice  Z91.148     5. Major depressive disorder,  recurrent episode, moderate (HCC)  F33.1     6. r/o MCI, predementia vs. nutritional, stress, and sedation effects  R69      Plan:  Today -- priority rests and fluids Medication organization -- Use a standard pillbox to organize meds rather than rely on daily concentration and organization, which vary Nutrition -- Prefer Cerefolin to simple B12 based on history and assessment, but understand prohibitive cost.  Still recommend OTC B complex as preferable to B12 only, given indications of broader  nutritional issues.  Maintain varied food sources, try to improve protein content, regulate vitamin supplementation, restrain late carbs.  Continue improved vegetable content but go for reduced-sodium if canned juice. Hydration -- Maintain adequate fluids, best use a measurable cup or jug to see, rather than trust intuition and sense of thirst Sleep hygiene -- maintain reasonably regular, timely bedtime, have electronics curfew.  Accept rest rather than sleep when not coming easy. 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  Endorse weaning, but try much more gradually, e.g., 1/4 mg at a time, starting with midday or morning, grade out from there.  Seek psychiatry's advice and partnership on the schedule for weaning. Financial organization -- Continue banking/arithmetic measures, including use of calculator and willingness to fresh start with the bank's balance.  If not yet, should approach bank about his annuity, may be able to safely increase withdrawals ahead of expected housing cost hike. Mouth and hand pain problems -- Address with approved OTC, Meloxicam, and PT per PCP's advice.  Make sure to comprehend referrals and instructions rather than let uncomfortable feelings drive irrational conclusion-jumping.  Maintain hydration to combat dry mouth and prevent any suspected salivary gland inflammation. PCP relationship and medical organization -- Work with PCP in good faith to fully inform and ask necessary questions, fully comprehend medical advice, written instructions may help.  Care coordination with psychiatry and PCP ad lib. Regret/shame -- review reframes, reaffirm the good he tries to do, partial successes, good efforts, permission to learn Social connections and morale -- Check with [place] group.  Free choice about church, just stay clear he is free to assess, free to dig in a while, and free to decide if/when to offer his work in [employer]  when truly ready, not compelled by overwrought sense of duty.  Consider reinstating Juliann Pulse as his next-of-kin, possibly POA. Other recommendations/advice as may be noted above Continue to utilize previously learned skills ad lib Maintain medication as prescribed and work faithfully with relevant prescriber(s) if any changes are desired or seem indicated Call the clinic on-call service, 988/hotline, 911, or present to [facility] or ER if any life-threatening psychiatric crisis Return for session(s) already scheduled. Already scheduled visit in this office 11/05/2021.  Blanchie Serve, PhD Richard Moore, PhD LP Clinical Psychologist, [facility] [address], [place], [place] [address] [phone]

## 2021-11-02 NOTE — Discharge Instructions (Signed)
Take your medications only as directed.  Follow-up with your appointment today that was previously scheduled.

## 2021-11-02 NOTE — ED Triage Notes (Signed)
Pt. BIB [facility] for hypotension. Pt. C/o dizziness and was noted to have slurred speech per EMS. Pt. Took an extra dose of his prescribed muscle relaxer for back pain. Initial BP was 78/50.Given 500 ml NS by EMS  EMS VS:  BP: 104/68 HR: 71 O2: 94% CBG:131

## 2021-11-04 ENCOUNTER — Telehealth: Payer: Self-pay

## 2021-11-04 NOTE — Telephone Encounter (Signed)
Transition Care Management Follow-up Telephone Call Date of discharge and from where: [facility] 11/02/21 How have you been since you were released from the hospital? [place] Any questions or concerns? No  Items Reviewed: Did the pt receive and understand the discharge instructions provided? Yes  Medications obtained and verified? Yes  Other? No  Any new allergies since your discharge? No  Dietary orders reviewed? Yes Do you have support at home? No   Home Care and Equipment/Supplies: Were home health services ordered? no   Follow up appointments reviewed:  PCP Hospital f/u appt confirmed? Yes  Scheduled to see Richard Wilson on 11/10/21 @ 2:15. [facility] f/u appt confirmed? No   Are transportation arrangements needed? No  If their condition worsens, is the pt aware to call PCP or go to the Emergency Dept.? Yes Was the patient provided with contact information for the PCP's office or ED? Yes Was to pt encouraged to call back with questions or concerns? Yes

## 2021-11-05 ENCOUNTER — Other Ambulatory Visit: Payer: Self-pay | Admitting: Physician Assistant

## 2021-11-05 ENCOUNTER — Ambulatory Visit (INDEPENDENT_AMBULATORY_CARE_PROVIDER_SITE_OTHER): Payer: Medicare Other | Admitting: Physician Assistant

## 2021-11-05 ENCOUNTER — Encounter: Payer: Self-pay | Admitting: Physician Assistant

## 2021-11-05 DIAGNOSIS — F411 Generalized anxiety disorder: Secondary | ICD-10-CM | POA: Diagnosis not present

## 2021-11-05 DIAGNOSIS — F99 Mental disorder, not otherwise specified: Secondary | ICD-10-CM

## 2021-11-05 DIAGNOSIS — F5105 Insomnia due to other mental disorder: Secondary | ICD-10-CM | POA: Diagnosis not present

## 2021-11-05 DIAGNOSIS — F331 Major depressive disorder, recurrent, moderate: Secondary | ICD-10-CM

## 2021-11-05 MED ORDER — LAMOTRIGINE 150 MG PO TABS: 150.0000 mg | ORAL_TABLET | Freq: Every day | ORAL | 1 refills | Status: DC

## 2021-11-05 NOTE — Progress Notes (Signed)
[facility]  Patient ID: Richard Wilson,  MRN: [id number]  PCP: Denita Lung, MD  Date of Evaluation: 11/05/2021 Tme spent:30 minutes  Chief Complaint:  Chief Complaint   Anxiety; Depression; Insomnia; Follow-up    HISTORY/CURRENT STATUS: HPI For routine med check.    Depressed, doesn't want to do anything, not sleeping well lately, takes 1.5 hr nap in the afternoon. "I shut down. I need the nap badly." Then can't go to sleep at night, will end up getting up and last night, for example, didn't go to sleep until 3 AM.  States it is his choice to stay up late though.  He gets on his computer late at night and does not want to go to sleep.  ADLs and personal hygiene are normal.   Denies any changes in remembering things.  Appetite has not changed.  Weight is stable. Isolates.   Denies suicidal or homicidal thoughts.  States he's not sure how the anxiety is, he takes Klonopin routinely. No PA. Has been on it for many years and we've tried to wean off without success of even decreasing to 0.25 mg.   Patient denies increased energy with decreased need for sleep, increased talkativeness, racing thoughts, impulsivity or risky behaviors, increased spending, increased libido, grandiosity, increased irritability or anger, paranoia, or hallucinations.  Denies dizziness, syncope, seizures, numbness, tremor, tics, unsteady gait, slurred speech, confusion.  Denies dystonia.  Individual Medical History/ Review of Systems: Changes? :Yes    went to ER 3 times since LOV. Had URI, then back pain, dizziness.  Past medications for mental health diagnoses include: Rozerem, Trazodone caused vivid dreams, Xanax, Buspar, mirtazapine caused excessive somnolence and fatigue with flulike symptoms, Belsomra, propranolol for tremor, Zoloft, Abilify caused tremor, Klonopin, Effexor, Ativan, Wellbutrin, Nardil, gabapentin, Librium, Luvox, Amitriptyline   Pertinent info after reviewing Dr. Evelena Leyden  notes, his previous psychiatrist in [facility]. Past suicide attempt in September 1996, by taking Tylenol.  He was admitted at that time to a psychiatric hospital in [facility].  Admitted to psych hospital multiple times in the past for "nervous breakdowns."  January 1985, February 1986, October 1991, September 1996, July 2000.  Allergies: Garamycin [gentamicin] and Penicillins  Current Medications:  Current Outpatient Medications:    amitriptyline (ELAVIL) 25 MG tablet, Take 1 tablet (25 mg total) by mouth at bedtime., Disp: 90 tablet, Rfl: 0   B Complex Vitamins (VITAMIN B COMPLEX) TABS, Take 1 tablet by mouth daily., Disp: , Rfl:    clonazePAM (KLONOPIN) 1 MG tablet, Take 1 tablet (1 mg total) by mouth 3 (three) times daily as needed for anxiety. (Patient taking differently: Take 1 mg by mouth in the morning, at noon, and at bedtime.), Disp: 90 tablet, Rfl: 2   fluvoxaMINE (LUVOX) 100 MG tablet, Take 2 tablets (200 mg total) by mouth at bedtime., Disp: 60 tablet, Rfl: 2   lamoTRIgine (LAMICTAL) 150 MG tablet, Take 1 tablet (150 mg total) by mouth daily., Disp: 30 tablet, Rfl: 1   vitamin B-12 (CYANOCOBALAMIN) 1000 MCG tablet, Take 1,000 mcg by mouth daily., Disp: , Rfl:    chlorpheniramine-HYDROcodone (TUSSIONEX) 10-8 MG/5ML, Take 5 mLs by mouth every 12 (twelve) hours as needed for cough. (Patient not taking: Reported on 10/28/2021), Disp: 70 mL, Rfl: 0   Cholecalciferol (VITAMIN D3) 50 MCG (2000 UT) TABS, Take 1 tablet by mouth daily., Disp: , Rfl:    hydrocortisone 2.5 % cream, Apply 1 Application topically daily. (Patient not taking: Reported on  11/05/2021), Disp: , Rfl:    lidocaine (LIDODERM) 5 %, Place 1 patch onto the skin daily. Remove & Discard patch within 12 hours (Patient not taking: Reported on 10/28/2021), Disp: 15 patch, Rfl: 0 Medication Side Effects: sexual dysfunction   Family Medical/ Social History: Changes?  no  MENTAL HEALTH EXAM:  There  were no vitals taken for this visit.There is no height or weight on file to calculate BMI.  General Appearance: Casual and Well Groomed  Eye Contact:  Good  Speech:  Clear and Coherent, Normal Rate, and Talkative  Volume:  Normal  Mood:  Euthymic  Affect:  Congruent  Thought Process:  Goal Directed and Descriptions of Associations: Circumstantial  Orientation:  Full (Time, Place, and Person)  Thought Content: Logical   Suicidal Thoughts:  No  Homicidal Thoughts:  No  Memory:  WNL  Judgement:  Good  Insight:  Good  Psychomotor Activity:  Normal and no tremor present at this time.  Concentration:  Concentration: Good and Attention Span: Good  Recall:  Good  Fund of Knowledge: Good  Language: Good  Assets:  Desire for Improvement Financial Resources/Insurance Housing Transportation  ADL's:  Intact  Cognition: WNL  Prognosis:  Good   Reviewed ER visit notes from 10/08/2021, 10/25/2021, and 11/02/2021.  Labs from 10/08/2020 and 11/02/2021 reviewed.  Gene site test results are on chart under media. See neuro psych test results on chart.[name] [name] 01/21/2020  Note from neuro, Sharene Butters, PA-C from 07/21/2021.   DIAGNOSES:    ICD-10-CM   1. Major depressive disorder, recurrent episode, moderate (HCC)  F33.1     2. Generalized anxiety disorder  F41.1     3. Insomnia due to other mental disorder  F51.05    F99       Receiving Psychotherapy: Yes    Dr. Jonni Sanger Mitchum.   RECOMMENDATIONS:  PDMP was reviewed.  Klonopin last filled 10/14/2021. I provided 30 minutes of face to face time during this encounter, including time spent before and after the visit in records review, medical decision making, counseling pertinent to today's visit, and charting.   Discussed the sleep problems and depression. Recommend increasing Lamictal versus changing medications.  There has been a lot of confusion on his part with medications and doses.  He brought his medicines in today, I put the 2 bottles  of Lamictal 100 mg in a brown paper bag and wrote on it not to take those pills.  I am sending in a prescription for the higher dose.  Hopefully that will cut down on the confusion and will help with the depression.  Patient verbalized understanding.  Believes his PCP is working on a home health evaluation and hopefully they can help with dispensing of the correct medications, doses, and times.  As far as lack of sleep goes, in his words, "I do it to myself" so no medication will help him with that.  He verbalizes understanding.  Continue amitriptyline 25 mg, 1 p.o. nightly. Continue Klonopin 1 mg, 1 p.o. 3 times daily as needed.  (He takes routinely.) Continue Luvox 100 mg, 2 p.o. nightly.   Increase Lamictal to 150 mg, 1 p.o. daily. Continue vitamins as per med list. Continue therapy with Dr. [name]. Return in 4 weeks.  Donnal Moat, PA-C

## 2021-11-06 ENCOUNTER — Other Ambulatory Visit: Payer: Self-pay | Admitting: Physician Assistant

## 2021-11-10 ENCOUNTER — Encounter: Payer: Self-pay | Admitting: Psychiatry

## 2021-11-10 ENCOUNTER — Encounter: Payer: Self-pay | Admitting: Family Medicine

## 2021-11-10 ENCOUNTER — Ambulatory Visit: Payer: Medicare Other | Admitting: Psychiatry

## 2021-11-10 ENCOUNTER — Encounter: Payer: Self-pay | Admitting: Internal Medicine

## 2021-11-10 ENCOUNTER — Ambulatory Visit (INDEPENDENT_AMBULATORY_CARE_PROVIDER_SITE_OTHER): Payer: Medicare Other | Admitting: Family Medicine

## 2021-11-10 VITALS — BP 126/80 | HR 89 | Temp 96.8°F | Ht 69.0 in | Wt 191.2 lb

## 2021-11-10 DIAGNOSIS — R739 Hyperglycemia, unspecified: Secondary | ICD-10-CM

## 2021-11-10 DIAGNOSIS — Z23 Encounter for immunization: Secondary | ICD-10-CM | POA: Diagnosis not present

## 2021-11-10 DIAGNOSIS — Z1159 Encounter for screening for other viral diseases: Secondary | ICD-10-CM

## 2021-11-10 DIAGNOSIS — F411 Generalized anxiety disorder: Secondary | ICD-10-CM

## 2021-11-10 DIAGNOSIS — F331 Major depressive disorder, recurrent, moderate: Secondary | ICD-10-CM

## 2021-11-10 DIAGNOSIS — N522 Drug-induced erectile dysfunction: Secondary | ICD-10-CM

## 2021-11-10 DIAGNOSIS — E785 Hyperlipidemia, unspecified: Secondary | ICD-10-CM

## 2021-11-10 LAB — POCT GLYCOSYLATED HEMOGLOBIN (HGB A1C): Hemoglobin A1C: 5.5 % (ref 4.0–5.6)

## 2021-11-10 NOTE — Progress Notes (Deleted)
Richard Wilson is a 71 y.o. male who presents for annual wellness visit and follow-up on chronic medical conditions.  He has the following concerns:   Immunizations and Health Maintenance Immunization History  Administered Date(s) Administered   Influenza Split 12/15/2016, 11/02/2019   Influenza-Unspecified 12/07/2013, 11/18/2017, 11/01/2018, 11/06/2020   PFIZER(Purple Top)SARS-COV-2 Vaccination 05/03/2019, 05/24/2019, 01/02/2020   Pfizer Covid-19 Vaccine Bivalent Booster 62yr & up 10/09/2020   Pneumococcal Conjugate-13 03/31/2021   Pneumococcal Polysaccharide-23 03/24/2020   Tdap 03/24/2020   Zoster, Live 10/27/2011   Health Maintenance Due  Topic Date Due   Hepatitis C Screening  Never done   Zoster Vaccines- Shingrix (1 of 2) Never done   COVID-19 Vaccine (5 - Pfizer risk series) 12/04/2020   INFLUENZA VACCINE  09/01/2021    Last colonoscopy: Last PSA: Dentist: Ophtho: Exercise:  Other doctors caring for patient include:  Advanced Directives:    Depression screen:  See questionnaire below.        10/28/2021   11:06 AM 07/06/2021    2:42 PM 06/25/2021    9:31 AM 03/31/2021   10:51 AM 09/23/2020    3:42 PM  Depression screen PHQ 2/9  Decreased Interest '1 2 1 1 2  '$ Down, Depressed, Hopeless '3 1 1 3 3  '$ PHQ - 2 Score '4 3 2 4 5  '$ Altered sleeping 1 0  2 0  Tired, decreased energy '3 1  3 1  '$ Change in appetite 3 0  2 1  Feeling bad or failure about yourself  '1 2  2 3  '$ Trouble concentrating 0 0  0 0  Moving slowly or fidgety/restless 2 0  0 0  Suicidal thoughts 0 0  0 0  PHQ-9 Score '14 6  13 10  '$ Difficult doing work/chores  Not difficult at all  Somewhat difficult Very difficult    Fall Screen: See Questionaire below.      07/21/2021    9:24 AM 06/25/2021    9:31 AM 01/08/2021    2:32 PM 01/21/2020    9:21 AM  Fall Risk   Falls in the past year? 0 0 0 1  Number falls in past yr: 0 0 0 0  Injury with Fall? 0 0 0 0    ADL screen:  See questionnaire below.   Functional Status Survey:     Review of Systems  Constitutional: -, -unexpected weight change, -anorexia, -fatigue Allergy: -sneezing, -itching, -congestion Dermatology: denies changing moles, rash, lumps ENT: -runny nose, -ear pain, -sore throat,  Cardiology:  -chest pain, -palpitations, -orthopnea, Respiratory: -cough, -shortness of breath, -dyspnea on exertion, -wheezing,  Gastroenterology: -abdominal pain, -nausea, -vomiting, -diarrhea, -constipation, -dysphagia Hematology: -bleeding or bruising problems Musculoskeletal: -arthralgias, -myalgias, -joint swelling, -back pain, - Ophthalmology: -vision changes,  Urology: -dysuria, -difficulty urinating,  -urinary frequency, -urgency, incontinence Neurology: -, -numbness, , -memory loss, -falls, -dizziness    PHYSICAL EXAM:  There were no vitals taken for this visit.  General Appearance: Alert, cooperative, no distress, appears stated age Head: Normocephalic, without obvious abnormality, atraumatic Eyes: PERRL, conjunctiva/corneas clear, EOM's intact, fundi benign Ears: Normal TM's and external ear canals Nose: Nares normal, mucosa normal, no drainage or sinus   tenderness Throat: Lips, mucosa, and tongue normal; teeth and gums normal Neck: Supple, no lymphadenopathy, thyroid:no enlargement/tenderness/nodules; no carotid bruit or JVD Lungs: Clear to auscultation bilaterally without wheezes, rales or ronchi; respirations unlabored Heart: Regular rate and rhythm, S1 and S2 normal, no murmur, rub or gallop Abdomen: Soft, non-tender, nondistended, normoactive bowel sounds, no  masses, no hepatosplenomegaly Extremities: No clubbing, cyanosis or edema Pulses: 2+ and symmetric all extremities Skin: Skin color, texture, turgor normal, no rashes or lesions Lymph nodes: Cervical, supraclavicular, and axillary nodes normal Neurologic: CNII-XII intact, normal strength, sensation and gait; reflexes 2+ and symmetric throughout   Psych:  Normal mood, affect, hygiene and grooming  ASSESSMENT/PLAN: No diagnosis found.    Discussed PSA screening (risks/benefits), recommended at least 30 minutes of aerobic activity at least 5 days/week; proper sunscreen use reviewed; healthy diet and alcohol recommendations (less than or equal to 2 drinks/day) reviewed; regular seatbelt use; changing batteries in smoke detectors. Immunization recommendations discussed.  Colonoscopy recommendations reviewed.   Medicare Attestation I have personally reviewed: The patient's medical and social history Their use of alcohol, tobacco or illicit drugs Their current medications and supplements The patient's functional ability including ADLs,fall risks, home safety risks, cognitive, and hearing and visual impairment Diet and physical activities Evidence for depression or mood disorders  The patient's weight, height, and BMI have been recorded in the chart.  I have made referrals, counseling, and provided education to the patient based on review of the above and I have provided the patient with a written personalized care plan for preventive services.     Jill Alexanders, MD   11/10/2021

## 2021-11-10 NOTE — Progress Notes (Signed)
Admin note for non-service contact  Patient ID: Richard Wilson  MRN: [id number] DATE: 11/10/2021  NS for 9am appt.  Courtesy call from staff reveals he did not have it on his calendar, which is unusual, and appt was scheduled in August.  Given last week's altered mental status and follow from ER trip, 90 minute walk home, disturbed sleep cycle, and initiating home health consultation with PCP, waived fee and RS to an opening tomorrow.  Blanchie Serve, PhD [name], PhD LP Clinical Psychologist, [facility] [address] [place], [place] [address] [phone]

## 2021-11-10 NOTE — Patient Instructions (Signed)
Health Maintenance, Male Adopting a healthy lifestyle and getting preventive care are important in promoting health and wellness. Ask your health care provider about: The right schedule for you to have regular tests and exams. Things you can do on your own to prevent diseases and keep yourself healthy. What should I know about diet, weight, and exercise? Eat a healthy diet  Eat a diet that includes plenty of vegetables, fruits, low-fat dairy products, and lean protein. Do not eat a lot of foods that are high in solid fats, added sugars, or sodium. Maintain a healthy weight Body mass index (BMI) is a measurement that can be used to identify possible weight problems. It estimates body fat based on height and weight. Your health care provider can help determine your BMI and help you achieve or maintain a healthy weight. Get regular exercise Get regular exercise. This is one of the most important things you can do for your health. Most adults should: Exercise for at least 150 minutes each week. The exercise should increase your heart rate and make you sweat (moderate-intensity exercise). Do strengthening exercises at least twice a week. This is in addition to the moderate-intensity exercise. Spend less time sitting. Even light physical activity can be beneficial. Watch cholesterol and blood lipids Have your blood tested for lipids and cholesterol at 69 years of age, then have this test every 5 years. You may need to have your cholesterol levels checked more often if: Your lipid or cholesterol levels are high. You are older than 69 years of age. You are at high risk for heart disease. What should I know about cancer screening? Many types of cancers can be detected early and may often be prevented. Depending on your health history and family history, you may need to have cancer screening at various ages. This may include screening for: Colorectal cancer. Prostate cancer. Skin cancer. Lung  cancer. What should I know about heart disease, diabetes, and high blood pressure? Blood pressure and heart disease High blood pressure causes heart disease and increases the risk of stroke. This is more likely to develop in people who have high blood pressure readings or are overweight. Talk with your health care provider about your target blood pressure readings. Have your blood pressure checked: Every 3-5 years if you are 18-39 years of age. Every year if you are 40 years old or older. If you are between the ages of 65 and 75 and are a current or former smoker, ask your health care provider if you should have a one-time screening for abdominal aortic aneurysm (AAA). Diabetes Have regular diabetes screenings. This checks your fasting blood sugar level. Have the screening done: Once every three years after age 45 if you are at a normal weight and have a low risk for diabetes. More often and at a younger age if you are overweight or have a high risk for diabetes. What should I know about preventing infection? Hepatitis B If you have a higher risk for hepatitis B, you should be screened for this virus. Talk with your health care provider to find out if you are at risk for hepatitis B infection. Hepatitis C Blood testing is recommended for: Everyone born from 1945 through 1965. Anyone with known risk factors for hepatitis C. Sexually transmitted infections (STIs) You should be screened each year for STIs, including gonorrhea and chlamydia, if: You are sexually active and are younger than 69 years of age. You are older than 69 years of age and your   health care provider tells you that you are at risk for this type of infection. Your sexual activity has changed since you were last screened, and you are at increased risk for chlamydia or gonorrhea. Ask your health care provider if you are at risk. Ask your health care provider about whether you are at high risk for HIV. Your health care provider  may recommend a prescription medicine to help prevent HIV infection. If you choose to take medicine to prevent HIV, you should first get tested for HIV. You should then be tested every 3 months for as long as you are taking the medicine. Follow these instructions at home: Alcohol use Do not drink alcohol if your health care provider tells you not to drink. If you drink alcohol: Limit how much you have to 0-2 drinks a day. Know how much alcohol is in your drink. In the [place] one drink equals one 12 oz bottle of beer (355 mL), one 5 oz glass of wine (148 mL), or one 1 oz glass of hard liquor (44 mL). Lifestyle Do not use any products that contain nicotine or tobacco. These products include cigarettes, chewing tobacco, and vaping devices, such as e-cigarettes. If you need help quitting, ask your health care provider. Do not use street drugs. Do not share needles. Ask your health care provider for help if you need support or information about quitting drugs. General instructions Schedule regular health, dental, and eye exams. Stay current with your vaccines. Tell your health care provider if: You often feel depressed. You have ever been abused or do not feel safe at home. Summary Adopting a healthy lifestyle and getting preventive care are important in promoting health and wellness. Follow your health care provider's instructions about healthy diet, exercising, and getting tested or screened for diseases. Follow your health care provider's instructions on monitoring your cholesterol and blood pressure. This information is not intended to replace advice given to you by your health care provider. Make sure you discuss any questions you have with your health care provider. Document Revised: 06/09/2020 Document Reviewed: 06/09/2020 Elsevier Patient Education  2023 [employer]  

## 2021-11-10 NOTE — Progress Notes (Signed)
Richard Wilson is a 71 y.o. male who presents for annual wellness visit and follow-up on chronic medical conditions.  He continues to be followed by psychiatry.  He does have questions over continuing with this particular therapy and I encouraged him to continue with this.  He does have a history of hyperlipidemia and most recently had elevated blood sugar.  He also has erectile dysfunction which is most likely secondary to his medications.  Recent blood work did show evidence of poor nutrition.   Immunizations and Health Maintenance Immunization History  Administered Date(s) Administered   Influenza Split 12/15/2016, 11/02/2019   Influenza-Unspecified 12/07/2013, 11/18/2017, 11/01/2018, 11/06/2020   PFIZER(Purple Top)SARS-COV-2 Vaccination 05/03/2019, 05/24/2019, 01/02/2020   Pfizer Covid-19 Vaccine Bivalent Booster 68yr & up 10/09/2020   Pneumococcal Conjugate-13 03/31/2021   Pneumococcal Polysaccharide-23 03/24/2020   Tdap 03/24/2020   Zoster, Live 10/27/2011   Health Maintenance Due  Topic Date Due   Hepatitis C Screening  Never done   Zoster Vaccines- Shingrix (1 of 2) Never done   COVID-19 Vaccine (5 - Pfizer risk series) 12/04/2020   INFLUENZA VACCINE  09/01/2021    Last colonoscopy: 2014 Q five years  Last PSA:03/31/21 (6.01) Dentist: Q six months  Ophtho: three or for years ago Exercise: N/A  Other doctors caring for patient include: Dr. MRica [facility] Advanced Directives: Does Patient Have a Medical Advance Directive?: Yes Type of Advance Directive: Living will Does patient want to make changes to medical advance directive?: No - Patient declined Would patient like information on creating a medical advance directive?: No - Patient declined  Depression screen:  See questionnaire below.        10/28/2021   11:06 AM 07/06/2021    2:42 PM 06/25/2021    9:31 AM 03/31/2021   10:51 AM 09/23/2020    3:42 PM  Depression screen PHQ 2/9  Decreased Interest '1 2 1 1 2  '$ Down,  Depressed, Hopeless '3 1 1 3 3  '$ PHQ - 2 Score '4 3 2 4 5  '$ Altered sleeping 1 0  2 0  Tired, decreased energy '3 1  3 1  '$ Change in appetite 3 0  2 1  Feeling bad or failure about yourself  '1 2  2 3  '$ Trouble concentrating 0 0  0 0  Moving slowly or fidgety/restless 2 0  0 0  Suicidal thoughts 0 0  0 0  PHQ-9 Score '14 6  13 10  '$ Difficult doing work/chores  Not difficult at all  Somewhat difficult Very difficult    Fall Screen: See Questionaire below.      11/10/2021    2:18 PM 07/21/2021    9:24 AM 06/25/2021    9:31 AM 01/08/2021    2:32 PM 01/21/2020    9:21 AM  Fall Risk   Falls in the past year? 0 0 0 0 1  Number falls in past yr: 0 0 0 0 0  Injury with Fall? 0 0 0 0 0  Risk for fall due to : No Fall Risks      Follow up Falls evaluation completed        ADL screen:  See questionnaire below.  Functional Status Survey:     Review of Systems  Constitutional: -, -unexpected weight change, -anorexia, -fatigue Allergy: -sneezing, -itching, -congestion Dermatology: denies changing moles, rash, lumps ENT: -runny nose, -ear pain, -sore throat,  Cardiology:  -chest pain, -palpitations, -orthopnea, Respiratory: -cough, -shortness of breath, -dyspnea on exertion, -wheezing,  Gastroenterology: -abdominal  pain, -nausea, -vomiting, -diarrhea, -constipation, -dysphagia Hematology: -bleeding or bruising problems Musculoskeletal: -arthralgias, -myalgias, -joint swelling, -back pain, - Ophthalmology: -vision changes,  Urology: -dysuria, -difficulty urinating,  -urinary frequency, -urgency, incontinence Neurology: -, -numbness, , -memory loss, -falls, -dizziness    PHYSICAL EXAM: BP 126/80   Pulse 89   Temp (!) 96.8 F (36 C)   Ht '5\' 9"'$  (1.753 m)   Wt 191 lb 3.2 oz (86.7 kg)   SpO2 96%   BMI 28.24 kg/m   General Appearance: Alert, cooperative, no distress, appears stated age Head: Normocephalic, without obvious abnormality, atraumatic Eyes: PERRL, conjunctiva/corneas clear,  EOM's intact,  Ears: Normal TM's and external ear canals Nose: Nares normal, mucosa normal, no drainage or sinus   tenderness Throat: Lips, mucosa, and tongue normal; teeth and gums normal Neck: Supple, no lymphadenopathy, thyroid:no enlargement/tenderness/nodules; no carotid bruit or JVD Lungs: Clear to auscultation bilaterally without wheezes, rales or ronchi; respirations unlabored Heart: Regular rate and rhythm, S1 and S2 normal, no murmur, rub or gallop Abdomen: Soft, non-tender, nondistended, normoactive bowel sounds, no masses, no hepatosplenomegaly Skin: Skin color, texture, turgor normal, no rashes or lesions Lymph nodes: Cervical, supraclavicular, and axillary nodes normal Neurologic: CNII-XII intact, normal strength, sensation and gait; reflexes 2+ and symmetric throughout   Psych: Normal mood, affect, hygiene and grooming  ASSESSMENT/PLAN: Moderate episode of recurrent major depressive disorder (HCC)  Need for COVID-19 vaccine - Plan: PFIZER Comirnaty(GRAY TOP)COVID-19 Vaccine  Need for hepatitis C screening test - Plan: Hepatitis C antibody  Generalized anxiety disorder  Drug-induced erectile dysfunction  Hyperlipidemia, unspecified hyperlipidemia type  Hyperglycemia  Encouraged him to get Shingrix and RSV at the drugstore.  Encouraged him to continue in counseling and with psychiatry.  Also discussed sexual activity and since he has ADD, that is limited.  Immunization recommendations discussed.  Colonoscopy recommendations reviewed.   Medicare Attestation I have personally reviewed: The patient's medical and social history Their use of alcohol, tobacco or illicit drugs Their current medications and supplements The patient's functional ability including ADLs,fall risks, home safety risks, cognitive, and hearing and visual impairment Diet and physical activities Evidence for depression or mood disorders  The patient's weight, height, and BMI have been recorded in  the chart.  I have made referrals, counseling, and provided education to the patient based on review of the above and I have provided the patient with a written personalized care plan for preventive services.     Jill Alexanders, MD   11/10/2021

## 2021-11-11 ENCOUNTER — Ambulatory Visit (INDEPENDENT_AMBULATORY_CARE_PROVIDER_SITE_OTHER): Payer: Medicare Other | Admitting: Psychiatry

## 2021-11-11 DIAGNOSIS — G3184 Mild cognitive impairment, so stated: Secondary | ICD-10-CM

## 2021-11-11 DIAGNOSIS — F331 Major depressive disorder, recurrent, moderate: Secondary | ICD-10-CM

## 2021-11-11 DIAGNOSIS — F4010 Social phobia, unspecified: Secondary | ICD-10-CM | POA: Diagnosis not present

## 2021-11-11 DIAGNOSIS — F411 Generalized anxiety disorder: Secondary | ICD-10-CM

## 2021-11-11 DIAGNOSIS — F99 Mental disorder, not otherwise specified: Secondary | ICD-10-CM

## 2021-11-11 DIAGNOSIS — F5105 Insomnia due to other mental disorder: Secondary | ICD-10-CM

## 2021-11-11 DIAGNOSIS — Z79899 Other long term (current) drug therapy: Secondary | ICD-10-CM

## 2021-11-11 DIAGNOSIS — Z91148 Patient's other noncompliance with medication regimen for other reason: Secondary | ICD-10-CM

## 2021-11-11 LAB — HEPATITIS C ANTIBODY: Hep C Virus Ab: NONREACTIVE

## 2021-11-11 NOTE — Progress Notes (Signed)
Psychotherapy Progress Note [facility] Richard Moore, PhD LP  Patient ID: Richard Wilson [facility] "Richard Wilson")    MRN: [id number] Therapy format: Individual psychotherapy Date: 11/11/2021      Start: 10:20a     Stop: 11:06a     Time Spent: 46 min Location: In-person   Session narrative (presenting needs, interim history, self-report of stressors and symptoms, applications of prior therapy, status changes, and interventions made in session) Thinks he left meds here yesterday, but he was never here yesterday -- noshowed, in fact -- but did not remember that.  Eventually realizes he left them at Dr. Lanice Wilson office yesterday.  Will go by afterward.    For now, animated, energetic to get to other things.  For one, preoccupied all of a sudden with getting a new dating account set up.  Anxiety attack last night, got flustered trying to get his picture onto the dating site, but before he can elaborate says these meds "don't work" for anxiety and psychiatry keeps changing his mix (?).  Allowed to vent then supportively challenged his attributions, and probed his willingness to seek volunteer tech help working with the dating site, but he dismisses.  States he is feeling overwhelmed, and impatient for God to do something!, he's been feeling like dying, and now he is abandoning the church in [place] that seemed fairly promising as a new religious home.  Eventually admits that he was forgetting morning meds, then catching them back up at noon, which often meant doubling his Klonopin midday (recently reported omitting it), getting oversedated for afternoon, and potentially going through w/d effects late morning.  Challenged to regulate them, as this pattern only will exaggerate psychological dependency, cognitive deficits, and difficulty telling what the benefits and drawbacks are.    Discussed the dating site issue, confirmed he is willing to actually meet people if he succeeds.   Referred him to public tech help, and advocated finally acting on a longstanding suggestion to get acquainted with Richard Wilson, the [place] [facility] on [address] to make acquaintances and begin networking to see who might be of help learning the town and working through life admin needs like the dating account.  Also recommended approach his priest about any referring he might do for fellow parishioners who could be open to a new friendship.  Agrees.  Therapeutic modalities: Cognitive Behavioral Therapy, Solution-Oriented/Positive Psychology, Ego-Supportive, and directive  Mental Status/Observations:  Appearance:   Casual     Behavior:  Monopolizing  Motor:  Normal  Speech/Language:   Clear and Coherent  Affect:  Appropriate  Mood:  anxious and dysthymic  Thought process:  Some flight  Thought content:    Rumination  Sensory/Perceptual disturbances:    WNL  Orientation:  Fully oriented  Attention:  Good    Concentration:  Fair  Memory:  Some impairment  Insight:    Fair  Judgment:   Fair  Impulse Control:  Fair   Risk Assessment: Danger to Self: No Self-injurious Behavior: No Danger to Others: No Physical Aggression / Violence: No Duty to Warn: No Access to Firearms a concern: No  Assessment of progress:  stabilized  Diagnosis:   ICD-10-CM   1. Major depressive disorder, recurrent episode, moderate (HCC)  F33.1     2. Generalized anxiety disorder  F41.1     3. Social anxiety disorder  F40.10     4. Insomnia due to other mental disorder  F51.05    F99     5. Mild cognitive  impairment of uncertain or unknown etiology -- suspect combination of nutritional deficiency, anxiety, sedative effects, and dyssomnia with possible apnea  G31.84     6. Variable compliance with medication and medical advice  Z91.148     7. Long-term current use of benzodiazepine  Z79.899      Plan:  Safety -- pledge no rash dosing nor overdose Dating -- OK with using app but temper expectations,  get friendly tech help Social connections and morale -- Check with [place] group.  Free choice about church, just stay clear he may have to dig in a while to truly know what can be.  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  May inquire with priest about connecting to others in search of a friend, not necessarily dating. Responsible party -- Consider reinstating Richard Wilson as his next-of-kin, possibly POA. Medication organization -- Use a standard pillbox to organize meds rather than rely on daily concentration and organization, which vary Nutrition -- Prefer Cerefolin to simple B12 based on history and assessment, but understand prohibitive cost.  Still recommend OTC B complex as preferable to B12 only, given indications of broader nutritional issues.  Maintain varied food sources, try to improve protein content, regulate vitamin supplementation, restrain late carbs.  Continue improved vegetable content but go for reduced-sodium if canned juice. Hydration -- Maintain adequate fluids, best use a measurable cup or jug to see, rather than trust intuition and sense of thirst Sleep hygiene -- Maintain reasonably regular, timely bedtime, and electronics curfew.  Accept rest rather than sleep when not coming easy. 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  Absolutely must re-regulate dosing before trying to wean, and then try much more gradually, e.g., 1/4 mg at a time, starting with midday or morning, grade out from there.  Seek psychiatry's advice and partnership on the schedule for weaning. Financial organization -- Continue banking/arithmetic measures, including use of calculator and willingness to fresh start with the bank's balance.  If not yet, should approach bank about his annuity and budgeting help. Mouth and hand pain problems -- Address with approved OTC, Meloxicam, and PT per PCP's advice.  Make sure to comprehend  referrals and instructions rather than let uncomfortable feelings drive irrational conclusion-jumping.  Maintain hydration to combat dry mouth and prevent any suspected salivary gland inflammation. PCP relationship and medical organization -- Work with PCP in good faith to fully inform and ask necessary questions, fully comprehend medical advice, written instructions may help.  Care coordination with psychiatry and PCP ad lib. Regret/shame -- reaffirm the good he tries to do, partial successes, good efforts, and permission to learn, and check drastic decisions Other recommendations/advice as may be noted above Continue to utilize previously learned skills ad lib Maintain medication as prescribed and work faithfully with relevant prescriber(s) if any changes are desired or seem indicated Call the clinic on-call service, 988/hotline, 911, or [facility] if any life-threatening psychiatric crisis Return for session(s) already scheduled. Already scheduled visit in this office 11/17/2021.  RBlanchie Serve PhD ALuan Moore PhD LP Clinical Psychologist, [facility] [address] [place] [phone]

## 2021-11-13 ENCOUNTER — Telehealth: Payer: Self-pay

## 2021-11-13 NOTE — Telephone Encounter (Signed)
     Patient  visit on 11/02/2021  at [facility] was for Dizziness and giddiness, hypotension.  Have you been able to follow up with your primary care physician? Yes  The patient was or was not able to obtain any needed medicine or equipment. Patient has obtained all presribed meds.  Are there diet recommendations that you are having difficulty following? No  Patient expresses understanding of discharge instructions and education provided has no other needs at this time.    [place] Resource Care Guide   ??[email or web address]  ?? [phone]   Website: [email or web address]  [email or web address]

## 2021-11-17 ENCOUNTER — Ambulatory Visit: Payer: Medicare Other | Admitting: Psychiatry

## 2021-11-17 ENCOUNTER — Ambulatory Visit (INDEPENDENT_AMBULATORY_CARE_PROVIDER_SITE_OTHER): Payer: Medicare Other | Admitting: Family Medicine

## 2021-11-17 VITALS — BP 122/80 | HR 104 | Ht 69.0 in | Wt 192.2 lb

## 2021-11-17 DIAGNOSIS — M7711 Lateral epicondylitis, right elbow: Secondary | ICD-10-CM | POA: Diagnosis not present

## 2021-11-17 NOTE — Progress Notes (Signed)
   Subjective:    Patient ID: Richard Wilson, male    DOB: 03/11/1950, 71 y.o.   MRN: [id number]  HPI He has approximately 3-week history of right elbow discomfort but last night it got much worse.  He does note that it gets worse with grabbing anything with his palm down.   Review of Systems     Objective:   Physical Exam Full motion of the right elbow with pain over the lateral epicondyle.  Provocative testing was positive.       Assessment & Plan:  Lateral epicondylitis of right elbow Information given concerning lateral epicondylitis.  Heat for 20 minutes 3 times per day, 2 Aleve twice per day and do as many things as possible palms up and open.

## 2021-11-17 NOTE — Patient Instructions (Signed)
Heat for 20 minutes 3 times per day 2 Aleve twice per day.  Do as many things as you can palms up and open.  Do this for couple weeks and that should clear it up  Tennis Elbow  Tennis elbow is irritation and swelling (inflammation) in your outer forearm, near your elbow. Swelling affects the tissues that connect muscle to bone (tendons). Tennis elbow can happen playing any sport or doing any job where you use your elbow too much. It is caused by doing the same motion over and over. What are the causes? This condition is often caused by playing sports or doing work where you need to keep moving your forearm the same way. Sometimes, it may be caused by a sudden injury. What increases the risk? You are more likely to get tennis elbow if you play tennis or another racket sport. You also have a higher risk if you often use your hands for work. This includes: People who use computers. Architect workers. People who work in a factory. Musicians. Cooks. Cashiers. What are the signs or symptoms? Pain and tenderness in your forearm and the outer part of your elbow. You may have pain all the time or only when you use your arm. A burning feeling. This starts in your elbow and spreads down your arm. A weak grip in your hand. How is this treated? Resting and icing your arm is often the first treatment. Your doctor may also recommend: Medicines to reduce pain and swelling. An elbow strap. Physical therapy. This may include massage or exercises or both. An elbow brace. If these do not help your symptoms get better, your doctor may recommend surgery. Follow these instructions at home: If you have a brace or strap: Wear the brace or strap as told by your doctor. Take it off only as told by your doctor. Check the skin around the brace or strap every day. Tell your doctor if you see problems. Loosen it if your fingers: Tingle. Become numb. Turn cold and blue. Keep the brace or strap clean. If the  brace or strap is not waterproof: Do not let it get wet. Cover it with a watertight covering when you take a bath or a shower. Managing pain, stiffness, and swelling  If told, put ice on the injured area. To do this: If you have a removable brace or strap, take it off as told by your doctor. Put ice in a plastic bag. Place a towel between your skin and the bag. Leave the ice on for 20 minutes, 2-3 times a day. Take off the ice if your skin turns bright red. This is very important. If you cannot feel pain, heat, or cold, you have a greater risk of damage to the area. Move your fingers often. Activity Rest your elbow and wrist. Avoid activities that can cause elbow problems as told by your doctor. Do exercises as told by your doctor. If you lift an object, lift it with your palm facing up. Lifestyle If your tennis elbow is caused by sports, check your equipment and make sure that: You are using it the right way. It fits you well. If your tennis elbow is caused by work or computer use, take breaks often to stretch your arm. Talk with your manager about how you can make your condition better at work. General instructions Take over-the-counter and prescription medicines only as told by your doctor. Do not smoke or use any products that contain nicotine or tobacco. If you  need help quitting, ask your doctor. Keep all follow-up visits. How is this prevented? Before and after being active: Warm up and stretch before being active. Cool down and stretch after being active. Give your body time to rest between activities. While being active: Make sure to use equipment that fits you. If you play tennis, put power in your stroke with your lower body. Avoid using your arm only. Maintain physical fitness. This includes: Strength. Flexibility. Endurance. Do exercises to strengthen the forearm muscles. Contact a doctor if: Your pain does not get better with treatment. Your pain gets worse. You  have weakness in your forearm, hand, or fingers. You cannot feel your forearm, hand, or fingers. Get help right away if: Your pain is very bad. You cannot move your wrist. Summary Tennis elbow is irritation and swelling (inflammation) in your outer forearm, near your elbow. Tennis elbow is caused by doing the same motion over and over. Rest your elbow and wrist. Avoid activities as told by your doctor. If told, put ice on the injured area for 20 minutes, 2-3 times a day. This information is not intended to replace advice given to you by your health care provider. Make sure you discuss any questions you have with your health care provider. Document Revised: 07/31/2019 Document Reviewed: 07/31/2019 Elsevier Patient Education  [place].

## 2021-11-20 ENCOUNTER — Other Ambulatory Visit: Payer: Self-pay | Admitting: Family Medicine

## 2021-11-20 ENCOUNTER — Other Ambulatory Visit: Payer: Self-pay | Admitting: Physician Assistant

## 2021-11-20 DIAGNOSIS — B356 Tinea cruris: Secondary | ICD-10-CM

## 2021-11-23 ENCOUNTER — Encounter: Payer: Self-pay | Admitting: Internal Medicine

## 2021-11-24 ENCOUNTER — Ambulatory Visit (INDEPENDENT_AMBULATORY_CARE_PROVIDER_SITE_OTHER): Payer: Medicare Other | Admitting: Psychiatry

## 2021-11-24 DIAGNOSIS — Z91148 Patient's other noncompliance with medication regimen for other reason: Secondary | ICD-10-CM

## 2021-11-24 DIAGNOSIS — F4010 Social phobia, unspecified: Secondary | ICD-10-CM

## 2021-11-24 DIAGNOSIS — F5105 Insomnia due to other mental disorder: Secondary | ICD-10-CM

## 2021-11-24 DIAGNOSIS — F331 Major depressive disorder, recurrent, moderate: Secondary | ICD-10-CM | POA: Diagnosis not present

## 2021-11-24 DIAGNOSIS — G3184 Mild cognitive impairment, so stated: Secondary | ICD-10-CM

## 2021-11-24 DIAGNOSIS — F99 Mental disorder, not otherwise specified: Secondary | ICD-10-CM

## 2021-11-24 DIAGNOSIS — Z79899 Other long term (current) drug therapy: Secondary | ICD-10-CM

## 2021-11-24 DIAGNOSIS — F411 Generalized anxiety disorder: Secondary | ICD-10-CM | POA: Diagnosis not present

## 2021-11-24 NOTE — Progress Notes (Signed)
Psychotherapy Progress Note [facility] Richard Moore, PhD LP  Patient ID: Richard Wilson [facility] "Richard Wilson")    MRN: [id number] Therapy format: Individual psychotherapy Date: 11/24/2021      Start: 9:04a     Stop: 9:54a     Time Spent: 50 min Location: In-person   Session narrative (presenting needs, interim history, self-report of stressors and symptoms, applications of prior therapy, status changes, and interventions made in session) More sedate today, inquiring about [place] and caregiver situation.  Has been some phone support lateyl to his cousin's Richard Wilson, caregiver for a wife with Parkinson's.   Returned to his focus on dating, apparently no longer desperate, dysphoric, or tempted to die or risk health.  Went to [employer] to get a photo made and uploaded to his dating site.  Seeking now for the first time in 5 or 6 years, when [place] [facility] and M still alive.  Has found several matches, some too far away or too out of his league.  Tempted to feel overexposed with it.  Encouraged to participate or not participate at discretion, just don't agonize over it.  Says he's feeling wistful, but the fact is, he's doing things he hasn't done in a while, like put himself out there for dating and clean his apartment, and it must be anxiety-provoking.  Affirmed and encouraged that taking care of these things is taking charge of his life more, and part of recovery.  Worrying he may have to be checked for dementia -- finds himself forgetting what he came into a room for.  Recalls having been tested last year but not what results were.  To be fair, he was plenty scattered then, too, and not prone to encode it well.  Recalled for him the main findings and recommendations.  Hopefully he has stabilized med dosing -- none mentioned, nor asked.  C/o gaining weight back he lost summer before last, , poor body image.  Assured it's not obvious, worth refocusing but  also worth improving his diet to include more greens and proteins.  Re. social connections, honestly admits he is not interested right now in checking out Richard Wilson or [employer] at this time.  Is continuing to attend [facility], believes he may stick.  Encouraged in planting one way or another and in reconsidering the two agencies after the present moment passes, as he has repeatedly complained of not feeling he fits into this community yet, and both are opportunities to have ready social support if he will only take the plunge.  Therapeutic modalities: Cognitive Behavioral Therapy, Solution-Oriented/Positive Psychology, and Ego-Supportive  Mental Status/Observations:  Appearance:   Casual     Behavior:  Resistant  Motor:  Normal  Speech/Language:   Clear and Coherent  Affect:  Appropriate  Mood:  dysthymic  Thought process:  grossly intact  Thought content:    Rumination  Sensory/Perceptual disturbances:    WNL  Orientation:  Fully oriented  Attention:  Good    Concentration:  Fair  Memory:  grossly intact  Insight:    Fair  Judgment:   Fair  Impulse Control:  Fair   Risk Assessment: Danger to Self: No Self-injurious Behavior: No Danger to Others: No Physical Aggression / Violence: No Duty to Warn: No Access to Firearms a concern: No  Assessment of progress:  stabilized  Diagnosis:   ICD-10-CM   1. Major depressive disorder, recurrent episode, moderate (HCC)  F33.1  2. Generalized anxiety disorder  F41.1     3. Social anxiety disorder  F40.10     4. Insomnia due to other mental disorder  F51.05    F99     5. Mild cognitive impairment of uncertain or unknown etiology -- suspect combination of nutritional deficiency, anxiety, sedative effects, and dyssomnia with possible apnea  G31.84     6. Variable compliance with medication and medical advice  Z91.148     7. Long-term current use of benzodiazepine  Z79.899      Plan:  Safety --  continuing pledge no rash dosing nor overdose Dating -- OK with using app but temper expectations, get friendly tech help.  Don't have to agonize, and don't even have to prioritized dating -- better to make friends first, especially if frightening sexual history is weighing. Social connections and morale -- Check with [place] group.  Free choice about church, just stay clear he may have to dig in a while to truly know what can be.  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  May inquire with priest about connecting to others in search of a friend, not necessarily dating. Responsible party -- Consider reinstating Richard Wilson as his next-of-kin, possibly POA. Medication organization -- Use a standard pillbox to organize meds rather than rely on daily concentration and organization, which vary Nutrition -- Prefer Cerefolin to simple B12 based on history and assessment, but understand prohibitive cost.  Still recommend OTC B complex as preferable to B12 only, given indications of broader nutritional issues.  Maintain varied food sources, try to improve protein content, regulate vitamin supplementation, restrain late carbs.  Continue improved vegetable content but go for reduced-sodium if canned juice. Hydration -- Maintain adequate fluids, best use a measurable cup or jug to see, rather than trust intuition and sense of thirst Sleep hygiene -- Maintain reasonably regular, timely bedtime, and electronics curfew.  Accept rest rather than sleep when not coming easy. 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  Absolutely must re-regulate dosing before trying to wean, and then try much more gradually, e.g., 1/4 mg at a time, starting with midday or morning, grade out from there.  Seek psychiatry's advice and partnership on the schedule for weaning. Financial organization -- Continue banking/arithmetic measures, including use of calculator  and willingness to fresh start with the bank's balance.  If not yet, should approach bank about his annuity and budgeting help. Mouth and hand pain problems -- Address with approved OTC, Meloxicam, and PT per PCP's advice.  Make sure to comprehend referrals and instructions rather than let uncomfortable feelings drive irrational conclusion-jumping.  Maintain hydration to combat dry mouth and prevent any suspected salivary gland inflammation. PCP relationship and medical organization -- Work with PCP in good faith to fully inform and ask necessary questions, fully comprehend medical advice, written instructions may help.  Care coordination with psychiatry and PCP ad lib. Regret/shame -- reaffirm the good he tries to do, partial successes, good efforts, and permission to learn, and check drastic decisions Other recommendations/advice as may be noted above Continue to utilize previously learned skills ad lib Maintain medication as prescribed and work faithfully with relevant prescriber(s) if any changes are desired or seem indicated Call the clinic on-call service, 988/hotline, 911, or [facility] if any life-threatening psychiatric crisis Return for time as available. Already scheduled visit in this office 12/07/2021.  RBlanchie Serve PhD ALuan Moore  PhD LP Clinical Psychologist, [facility] [address], [place], [place] [address] [phone]

## 2021-11-28 ENCOUNTER — Other Ambulatory Visit: Payer: Self-pay | Admitting: Physician Assistant

## 2021-12-01 ENCOUNTER — Ambulatory Visit (INDEPENDENT_AMBULATORY_CARE_PROVIDER_SITE_OTHER): Payer: Medicare Other | Admitting: Psychiatry

## 2021-12-01 DIAGNOSIS — F331 Major depressive disorder, recurrent, moderate: Secondary | ICD-10-CM

## 2021-12-01 DIAGNOSIS — F341 Dysthymic disorder: Secondary | ICD-10-CM

## 2021-12-01 DIAGNOSIS — Z91148 Patient's other noncompliance with medication regimen for other reason: Secondary | ICD-10-CM

## 2021-12-01 DIAGNOSIS — F411 Generalized anxiety disorder: Secondary | ICD-10-CM | POA: Diagnosis not present

## 2021-12-01 DIAGNOSIS — F4010 Social phobia, unspecified: Secondary | ICD-10-CM

## 2021-12-01 NOTE — Progress Notes (Addendum)
Psychotherapy Progress Note [facility] Richard Moore, PhD LP  Patient ID: Richard Wilson [facility] "Richard Wilson")    MRN: [id number] Therapy format: Individual psychotherapy Date: 12/01/2021      Start: 8:15a     Stop: 9:05a     Time Spent: 50 min Location: In-person   Session narrative (presenting needs, interim history, self-report of stressors and symptoms, applications of prior therapy, status changes, and interventions made in session) Wants to stop therapy, feels all he does is complain.  Supportively confronted as further negativism about his own functioning and behavior, actually, reflecting back moves made and work under way settling questions of how and where to make more of a social life, having transplanted to [place] without really integrating yet.  Ultimately, allowed that he may choose, and that he does have realistic choices to make where to spend.    Asserts that he is back on top of his finances and is working on nutrition.  Resigned the dating site, having come to feel he can't trust anyone on it, and knocked back by receiving solicitations from as far as [place].  Impulsively made an appt with a sex specialist, which would have been fully out of pocket and quite expensive, but he was so daunted by the pages of applications that he is backing out, which is also sensible from a financial standpoint.  Assured that whatever he may want from a sex therapist is also probably within reach here.  Hit with a $300 OOP dental bill, tempted to leave that dentist but understands better that he probably has limited choice without dental coverage.  Encouraged he investigate what the relatively new dental benefit is supposed to be under Medicare.  Working on establishing a walking habit.  No further report of medical issues nor outcome of home health visit purportedly arranged by PCP in response to emergency visit last month.  Remains resolved to stop therapy at this time.  Oriented  to open-door policy, commended to his further efforts, and confirmed ongoing med management.  Therapeutic modalities: Solution-Oriented/Positive Psychology and Ego-Supportive  Mental Status/Observations:  Appearance:   Casual and Neat     Behavior:  Appropriate  Motor:  Normal  Speech/Language:   Clear and Coherent  Affect:  Appropriate  Mood:  dysthymic  Thought process:  normal  Thought content:    Overvalued ideas  Sensory/Perceptual disturbances:    WNL  Orientation:  Fully oriented  Attention:  Good    Concentration:  Fair  Memory:  grossly intact  Insight:    Fair  Judgment:   Fair  Impulse Control:  Fair   Risk Assessment: Danger to Self: No Self-injurious Behavior: No Danger to Others: No Physical Aggression / Violence: No Duty to Warn: No Access to Firearms a concern: No  Assessment of progress:  stabilized  Diagnosis:   ICD-10-CM   1. Major depressive disorder, recurrent episode, moderate (HCC)  F33.1     2. Social anxiety disorder  F40.10     3. Generalized anxiety disorder  F41.1     4. 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 Variable compliance with medication and medical advice  Z91.148      Plan:  Presently, allow to suspend therapy, aware he is welcome back any time Medication organization -- Use a standard pillbox to organize meds rather than rely on daily concentration and organization, which vary Nutrition -- Prefer Cerefolin to simple B12 based on history and assessment, but understand prohibitive cost.  Still recommend OTC B complex as preferable to B12 only, given indications of broader nutritional issues.  Maintain varied food sources, try to improve protein content, regulate vitamin supplementation, restrain late carbs.  Continue improved vegetable content but go for reduced-sodium if canned juice. Hydration -- Maintain adequate fluids, best use a measurable cup or jug to see, rather than trust intuition and sense of thirst Sleep hygiene --  maintain reasonably regular, timely bedtime, have electronics curfew.  Accept rest rather than sleep when not coming easy. 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  Endorse weaning when ready, but recommend try much more gradually, e.g., 1/4 mg at a time, starting with midday or morning dose, grade out from there.  Seek psychiatry's advice and partnership on the schedule for weaning. Financial organization -- Continue banking/arithmetic measures, including use of calculator and willingness to fresh start with the bank's balance.  If not yet, should approach bank about his annuity, may be able to safely increase withdrawals ahead of expected housing cost hike. PCP relationship and medical organization -- Work with PCP in good faith to fully inform and ask necessary questions, fully comprehend medical advice, written instructions may help.  Care coordination with psychiatry and PCP ad lib. Regret/shame -- review interpretations and reframes, reaffirm the good he tries to do, partial successes, good efforts, permission to learn Social connections and morale -- Check with [employer].  Free choice about church, just stay clear that he is free to assess, free to review a while before volunteering, and free to decide if/when to offer his work in t[employer] Corporationwhen truly ready, not compelled by an overwrought sense of duty.  Consider reinstating SRockie Neighboursas his next-of-kin, possibly POA. Other recommendations/advice as may be noted above Continue to utilize previously learned skills ad lib Maintain medication as prescribed and work faithfully with relevant prescriber(s) if any changes are desired or seem indicated Call the clinic on-call service, 988/hotline, 911, or [facility] if any life-threatening psychiatric crisis Return for time at discretion. Already scheduled visit in this office 12/07/2021.  Richard Serve PhD ALuan Moore  PhD LP Clinical Psychologist, [facility] [address] [place] [phone]

## 2021-12-07 ENCOUNTER — Ambulatory Visit (INDEPENDENT_AMBULATORY_CARE_PROVIDER_SITE_OTHER): Payer: Medicare Other | Admitting: Physician Assistant

## 2021-12-07 ENCOUNTER — Encounter: Payer: Self-pay | Admitting: Physician Assistant

## 2021-12-07 DIAGNOSIS — F332 Major depressive disorder, recurrent severe without psychotic features: Secondary | ICD-10-CM | POA: Diagnosis not present

## 2021-12-07 DIAGNOSIS — Z1589 Genetic susceptibility to other disease: Secondary | ICD-10-CM

## 2021-12-07 DIAGNOSIS — F419 Anxiety disorder, unspecified: Secondary | ICD-10-CM | POA: Diagnosis not present

## 2021-12-07 DIAGNOSIS — F5105 Insomnia due to other mental disorder: Secondary | ICD-10-CM

## 2021-12-07 DIAGNOSIS — Z79899 Other long term (current) drug therapy: Secondary | ICD-10-CM

## 2021-12-07 DIAGNOSIS — F99 Mental disorder, not otherwise specified: Secondary | ICD-10-CM

## 2021-12-07 DIAGNOSIS — R45851 Suicidal ideations: Secondary | ICD-10-CM | POA: Diagnosis not present

## 2021-12-07 MED ORDER — LITHIUM CARBONATE 150 MG PO CAPS: 150.0000 mg | ORAL_CAPSULE | Freq: Two times a day (BID) | ORAL | 1 refills | Status: DC

## 2021-12-07 NOTE — Progress Notes (Addendum)
[facility]  Patient ID: Richard Wilson,  MRN: [id number]  PCP: Richard Lung, MD  Date of Evaluation: 12/07/2021 Tme spent:45 minutes  Chief Complaint:  Chief Complaint   Depression; Anxiety; Follow-up    HISTORY/CURRENT STATUS: HPI For routine med check.    Very depressed. Anhedonia. Likes to read still. Not going out. Cries easily. ADLs are nl. Personal hygiene is nl. Appetite is the same. Not eating healthy. Example-vanilla ice cream, vanilla extract, chocolate syrup over it, for supper sometimes. Can't go to sleep till around 3 AM, is able to stay asleep.   Last week he tried to commit suicide. States he didn't want to tell me b/c he knew I would commit him. He dumped 'a lot of his pills together and took several at a time' until he went to sleep. States he woke up and said 'well, that didn't work.' He has no idea what meds he took or how many. I asked 'more than 5' and he said yes, then 'more than 10' he said yes, but couldn't say anything more definitive. Took them over a few hours time. He's so tired of being depressed all the time. "I can't go on living like this." He misses his mom terribly, she died in 2015/05/14. He thinks she will walk through the door sometime but then he knows that has not true, he gets even more depressed.  "I cannot get over it." He's upset with himself b/c he told Richard Wilson last week during his appointment that he didn't need therapy at this point,  but now he wants to go back. Is nervous about telling him that he made a mistake and would like to continue seeing him.  He's thought about finding a therapist who is gay but now "I had something good and I might not be able to have it again."  He does not want to live and states he may take pills again.  But feels defeated with that because it did not work last week so "why try it again?"  No access to a firearm.  States he would not cut himself or shoot himself because he is too chicken.   Anxiety is  controlled.  Is tolerant to the Klonopin, we have tried to wean off or at least decrease the dose in the past but he reported withdrawals even at 0.25 mg drop in dose.  Review of Systems  Constitutional: Negative.   HENT: Negative.    Eyes: Negative.   Respiratory: Negative.    Cardiovascular: Negative.   Gastrointestinal: Negative.   Genitourinary: Negative.   Musculoskeletal: Negative.   Skin: Negative.   Neurological: Negative.   Endo/Heme/Allergies: Negative.   Psychiatric/Behavioral:         See HPI   Individual Medical History/ Review of Systems: Changes? :No     Past medications for mental health diagnoses include: Rozerem, Trazodone caused vivid dreams, Xanax, Buspar, mirtazapine caused excessive somnolence and fatigue with flulike symptoms, Belsomra, propranolol for tremor, Zoloft, Abilify caused tremor, Klonopin, Effexor, Ativan, Wellbutrin, Nardil, gabapentin, Librium, Luvox, Amitriptyline   Pertinent info after reviewing Richard Wilson notes, his previous psychiatrist in [facility].  Past suicide attempt in September 1996, by taking Tylenol.  He was admitted at that time to a psychiatric hospital in [facility].  Admitted to psych hospital multiple times in the past for "nervous breakdowns."  January 1985, February 1986, October 1991, September 1996, July 2000.  Allergies: Garamycin [gentamicin] and Penicillins  Current Medications:  Current Outpatient Medications:    amitriptyline (ELAVIL) 25 MG tablet, TAKE 1 TABLET BY MOUTH EVERYDAY AT BEDTIME, Disp: 90 tablet, Rfl: 0   B Complex Vitamins (VITAMIN B COMPLEX) TABS, Take 1 tablet by mouth daily., Disp: , Rfl:    clonazePAM (KLONOPIN) 1 MG tablet, Take 1 tablet (1 mg total) by mouth 3 (three) times daily as needed for anxiety. (Patient taking differently: Take 1 mg by mouth in the morning, at noon, and at bedtime.), Disp: 90 tablet, Rfl: 2   fluvoxaMINE (LUVOX) 100 MG tablet, Take 2  tablets (200 mg total) by mouth at bedtime., Disp: 60 tablet, Rfl: 2   lamoTRIgine (LAMICTAL) 150 MG tablet, TAKE 1 TABLET BY MOUTH EVERY DAY, Disp: 90 tablet, Rfl: 0   lithium carbonate 150 MG capsule, Take 1 capsule (150 mg total) by mouth 2 (two) times daily with a meal., Disp: 60 capsule, Rfl: 1   vitamin B-12 (CYANOCOBALAMIN) 1000 MCG tablet, Take 1,000 mcg by mouth daily., Disp: , Rfl:    Cholecalciferol (VITAMIN D3) 50 MCG (2000 UT) TABS, Take 1 tablet by mouth daily., Disp: , Rfl:    hydrocortisone 2.5 % cream, Apply 1 Application topically daily. (Patient not taking: Reported on 11/05/2021), Disp: , Rfl:    ibuprofen (ADVIL) 200 MG tablet, Take 600 mg by mouth every 6 (six) hours as needed. (Patient not taking: Reported on 12/07/2021), Disp: , Rfl:    lidocaine (LIDODERM) 5 %, Place 1 patch onto the skin daily. Remove & Discard patch within 12 hours (Patient not taking: Reported on 10/28/2021), Disp: 15 patch, Rfl: 0 Medication Side Effects: sexual dysfunction   Family Medical/ Social History: Changes?  no  MENTAL HEALTH EXAM:  There were no vitals taken for this visit.There is no height or weight on file to calculate BMI.  General Appearance: Casual and Well Groomed  Eye Contact:  Good  Speech:  Clear and Coherent, Normal Rate, and Talkative  Volume:  Increased  Mood:  Anxious, Depressed, and Hopeless  Affect:  Depressed, Labile, Tearful, and Anxious  Thought Process:  Goal Directed and Descriptions of Associations: Circumstantial  Orientation:  Full (Time, Place, and Person)  Thought Content: Logical   Suicidal Thoughts:  Yes.  with intent/plan  Homicidal Thoughts:  No  Memory:  WNL  Judgement:  Good  Insight:  Good  Psychomotor Activity:  Normal  Concentration:  Concentration: Good and Attention Span: Good  Recall:  Good  Fund of Knowledge: Good  Language: Good  Assets:  Desire for Improvement Financial Resources/Insurance Housing Transportation  ADL's:  Intact   Cognition: WNL  Prognosis:  Good   Gene site test results are on chart under media. See neuro psych test results on chart.Richard Wilson Dr. Carles Wilson 01/21/2020  Note from neuro, Sharene Butters, PA-C from 07/21/2021.   DIAGNOSES:    ICD-10-CM   1. Severe episode of recurrent major depressive disorder, without psychotic features ([place])  F33.2     2. Suicidal ideation  R45.851     3. Severe anxiety  F41.9     4. Insomnia due to other mental disorder  F51.05    F99     5. Long-term current use of benzodiazepine  Z79.899     6. Heterozygous MTHFR mutation C677T  Z15.89       Receiving Psychotherapy: Yes    Dr. Jonni Sanger Mitchum.   RECOMMENDATIONS:  PDMP was reviewed.  Klonopin last filled 11/21/2021. I provided 45 minutes of face to face time during this  encounter, including time spent before and after the visit in records review, medical decision making, counseling pertinent to today's visit, and charting.   When discussing his SI, I told him he had to go to Behavioral health urgent care.  He started sobbing, saying "please do not send me there.  They will take away my razor and I won't be able to shave. The food will be horrible and I won't have anything good to eat." He was wailing so loudly that Richard Wilson next door could hear him. He graciously came in and talked with [name], he told the patient that he is welcome to continue therapy with him. Patient calmed down and stated he really didn't want to kill himself, he just didn't care if he lived or died.  He does not have access to a firearm.  States he would never cut himself, he is scared to death of needles or blood.  Reports that he is not thinking any longer of hurting himself.  No homicidal thoughts.  Wants to talk to a priest that he knows and even made a phone call while in my office to arrange a meeting this afternoon.  [name] has definite short-term plans for the future, and did not want to go inpatient for suicidal ideations because of  caring about his appearance and the food he would get.  Stated he wanted to continue to look halfway decent.  For these reasons, I will not involuntarily commit him.  I think that would actually do more harm than good.    He is able to contract for safety.  Call the office on-call service, 988/hotline, 911, or present to [facility] or ER if any life-threatening psychiatric crisis. Patient verbalizes understanding.   As far as his medications go, there has been much confusion in the past with him taking wrong doses or incorrect medicines altogether.  For those reasons I do not think changing the dose of any of his current medications is reasonable.  I recommend starting low-dose lithium which can help with severe depression, suicidal thoughts and hopefully impulse control. Counseled patient regarding potential benefits, risks, and side effects of lithium to include potential risk of lithium affecting thyroid and renal function.  Discussed need for periodic lab monitoring to determine drug level and to assess for potential adverse effects.  Counseled patient regarding signs and symptoms of lithium toxicity which include worsening tremor, unsteadiness, slurred speech, confusion, nausea and vomiting, or diarrhea.  Patient was advised to notify office immediately or seek urgent medical attention if experiencing these signs and symptoms.  They should contact the office with any questions or concerns.  He understands that at the low dose hopefully he will have no side effects but to let me know if they occur. No other changes will be made.  Continue amitriptyline 25 mg, 1 p.o. nightly. Continue Klonopin 1 mg, 1 p.o. 3 times daily as needed.  (He takes routinely.) Continue Luvox 100 mg, 2 p.o. nightly.   Continue Lamictal 150 mg, 1 p.o. daily. Start lithium 150 mg, 1 p.o. twice daily. Continue vitamins as per med list. Continue therapy with Dr. [name]. Return in 3 days.  Donnal Moat, PA-C

## 2021-12-08 ENCOUNTER — Ambulatory Visit: Payer: Medicare Other | Admitting: Psychiatry

## 2021-12-10 ENCOUNTER — Ambulatory Visit (INDEPENDENT_AMBULATORY_CARE_PROVIDER_SITE_OTHER): Payer: Medicare Other | Admitting: Physician Assistant

## 2021-12-10 ENCOUNTER — Encounter: Payer: Self-pay | Admitting: Psychiatry

## 2021-12-10 ENCOUNTER — Encounter: Payer: Self-pay | Admitting: Physician Assistant

## 2021-12-10 DIAGNOSIS — F332 Major depressive disorder, recurrent severe without psychotic features: Secondary | ICD-10-CM | POA: Diagnosis not present

## 2021-12-10 DIAGNOSIS — Z79899 Other long term (current) drug therapy: Secondary | ICD-10-CM

## 2021-12-10 DIAGNOSIS — Z1589 Genetic susceptibility to other disease: Secondary | ICD-10-CM

## 2021-12-10 DIAGNOSIS — F419 Anxiety disorder, unspecified: Secondary | ICD-10-CM | POA: Diagnosis not present

## 2021-12-10 DIAGNOSIS — R37 Sexual dysfunction, unspecified: Secondary | ICD-10-CM

## 2021-12-10 NOTE — Progress Notes (Signed)
[facility]  Patient ID: Richard Wilson,  MRN: [id number]  PCP: Denita Lung, MD  Date of Evaluation: 12/10/2021 Tme spent:30 minutes  Chief Complaint:  Chief Complaint   Depression    HISTORY/CURRENT STATUS: HPI For routine med check.    Feels much better than he did 3 days ago. He went to talk with a priest after he left the office which made him feel better. Has had no thoughts of hurting himself since then. Has been reading for pleasure and watching documentaries about [employer] and [employer]. He started the lithium as directed on 11/6. Has had no SE. Still not sleeping well but partly b/c he wants to stay up late. Appetite is fair, eats a lot of junk food or things for convenience. Personal hygiene is nl. No SI or HI.   Anxiety has not changed. Klonopin effective, but also if he doesn't take it, he gets jittery and anxious. Has been on it for years. No PA.  Patient denies increased energy with decreased need for sleep, increased talkativeness, racing thoughts, impulsivity or risky behaviors, increased spending, increased libido, grandiosity, increased irritability or anger, paranoia, or hallucinations.  Reports having an appointment with a new therapist tomorrow. He wants to see someone weekly. He's aware that Dr. [name], on 11/6,  offered to work him in but he is embarrassed that he'd told Dr. [name] he felt like he was ok and didn't need to see him now. Even though Dr. [name] told him he was welcome to come back.   Denies dizziness, syncope, seizures, numbness, tremor, tics, unsteady gait, slurred speech, confusion.  Denies dystonia.  Individual Medical History/ Review of Systems: Changes? :No      Past medications for mental health diagnoses include: Rozerem, Trazodone caused vivid dreams, Xanax, Buspar, mirtazapine caused excessive somnolence and fatigue with flulike symptoms, Belsomra, propranolol for tremor, Zoloft, Abilify caused tremor, Klonopin,  Effexor, Ativan, Wellbutrin, Nardil, gabapentin, Librium, Luvox, Amitriptyline   Pertinent info after reviewing Dr. Evelena Leyden notes, his previous psychiatrist in [facility]. Past suicide attempt in September 1996, by taking Tylenol.  He was admitted at that time to a psychiatric hospital in [facility].  Admitted to psych hospital multiple times in the past for "nervous breakdowns."  January 1985, February 1986, October 1991, September 1996, July 2000.  Allergies: Garamycin [gentamicin] and Penicillins  Current Medications:  Current Outpatient Medications:    amitriptyline (ELAVIL) 25 MG tablet, TAKE 1 TABLET BY MOUTH EVERYDAY AT BEDTIME, Disp: 90 tablet, Rfl: 0   B Complex Vitamins (VITAMIN B COMPLEX) TABS, Take 1 tablet by mouth daily., Disp: , Rfl:    Cholecalciferol (VITAMIN D3) 50 MCG (2000 UT) TABS, Take 1 tablet by mouth daily., Disp: , Rfl:    clonazePAM (KLONOPIN) 1 MG tablet, Take 1 tablet (1 mg total) by mouth 3 (three) times daily as needed for anxiety. (Patient taking differently: Take 1 mg by mouth in the morning, at noon, and at bedtime.), Disp: 90 tablet, Rfl: 2   fluvoxaMINE (LUVOX) 100 MG tablet, Take 2 tablets (200 mg total) by mouth at bedtime., Disp: 60 tablet, Rfl: 2   lamoTRIgine (LAMICTAL) 150 MG tablet, TAKE 1 TABLET BY MOUTH EVERY DAY, Disp: 90 tablet, Rfl: 0   lithium carbonate 150 MG capsule, Take 1 capsule (150 mg total) by mouth 2 (two) times daily with a meal., Disp: 60 capsule, Rfl: 1   vitamin B-12 (CYANOCOBALAMIN) 1000 MCG tablet, Take 1,000 mcg by mouth  daily., Disp: , Rfl:    hydrocortisone 2.5 % cream, Apply 1 Application topically daily. (Patient not taking: Reported on 11/05/2021), Disp: , Rfl:    ibuprofen (ADVIL) 200 MG tablet, Take 600 mg by mouth every 6 (six) hours as needed. (Patient not taking: Reported on 12/07/2021), Disp: , Rfl:    lidocaine (LIDODERM) 5 %, Place 1 patch onto the skin daily. Remove & Discard patch  within 12 hours (Patient not taking: Reported on 10/28/2021), Disp: 15 patch, Rfl: 0 Medication Side Effects: sexual dysfunction   Family Medical/ Social History: Changes?  no  MENTAL HEALTH EXAM:  There were no vitals taken for this visit.There is no height or weight on file to calculate BMI.  General Appearance: Casual and Well Groomed  Eye Contact:  Good  Speech:  Clear and Coherent, Normal Rate, and Talkative  Volume:  Normal  Mood:  Euthymic  Affect:  Congruent  Thought Process:  Goal Directed and Descriptions of Associations: Circumstantial  Orientation:  Full (Time, Place, and Person)  Thought Content: Logical   Suicidal Thoughts:  No  Homicidal Thoughts:  No  Memory:  WNL  Judgement:  Good  Insight:  Good  Psychomotor Activity:  Normal and no tremor present at this time.  Concentration:  Concentration: Good and Attention Span: Good  Recall:  Good  Fund of Knowledge: Good  Language: Good  Assets:  Desire for Improvement Financial Resources/Insurance Housing Transportation  ADL's:  Intact  Cognition: WNL  Prognosis:  Good   Gene site test results are on chart under media. See neuro psych test results on chart.[name] [name] 01/21/2020  Note from neuro, Sharene Butters, PA-C from 07/21/2021.   DIAGNOSES:    ICD-10-CM   1. Severe episode of recurrent major depressive disorder, without psychotic features ([place])  F33.2 CANCELED: TSH    CANCELED: Basic metabolic panel    CANCELED: Lithium level    2. Encounter for long-term (current) use of medications  Z79.899 CANCELED: TSH    CANCELED: Basic metabolic panel    CANCELED: Lithium level    3. Severe anxiety  F41.9     4. Heterozygous MTHFR mutation C677T  Z15.89     5. Sexual dysfunction  R37      Receiving Psychotherapy: Yes    Dr. Jonni Sanger Mitchum.   RECOMMENDATIONS:  PDMP was reviewed.  Klonopin last filled 11/21/2021. I provided 30 minutes of face to face time during this encounter, including time spent before  and after the visit in records review, medical decision making, counseling pertinent to today's visit, and charting.   [name] acts as if there was no crisis last week or earlier this week. I doubt the Nicoletta Dress has worked this quickly, but maybe so, after 3 days. Still contracts for safety. Call the office on-call service, 988/hotline, 911, or present to [facility] or ER if any life-threatening psychiatric crisis. Patient verbalizes understanding.   Discussed the Lithium, even though this is a very low dose, will make sure level is ok, and TSH and kidney functions nl. He understands.  Again told him Dr. [name] has been willing to work him in, and he's welcome to see him again. Pt understands.   Continue amitriptyline 25 mg, 1 p.o. nightly. Continue Klonopin 1 mg, 1 p.o. 3 times daily as needed.  (He takes routinely.) Continue Luvox 100 mg, 2 p.o. nightly.   Continue Lamictal 150 mg, 1 p.o. daily. Continue Lithium 150 mg, 1 po bid. Continue vitamins as per  med list. Nutrition discussed.  Continue therapy, with new therapist or Dr. [name]. For now, he wants to keep 02/09/2021 appt w/ Dr. Jerilynn Mages.  Labs ordered as above.  Return in 1 month.  Donnal Moat, PA-C

## 2021-12-10 NOTE — Progress Notes (Signed)
Admin note for non-service contact  Patient ID: Richard Wilson  MRN: [id number] DATE: 12/10/2021  Separately noted by Ms. Hurst, sat in at no separate charge on a distressed visit with her 3 days ago to help assess and de-escalate emotional crisis and potential emergency referral for hospitalization.  Pt divulged having been crying every day, being completely wrung out and tired of it, desperate for something to work, as well as mortified and shattered for having made the decision to stop therapy the previous Tuesday.  No signs at the time (10/31) of Borderline dynamics, Histrionic decision-making, or dissociation, and in fact had seemed to soberly -- if impulsively -- made the decision and very clearly received and responded to assurance that the door remains open wth [facility] to be seen further if/when he changes his mind, and that urgent service would still be available.  As disclosed, he had no memory of that assurance and had convinced himself he had irreparably damaged rapport and cost himself all welcome, written a note prostrating himself and asking to be allowed to come back.  In the encounter, became distressed enough to require firm direction to "wake up" and direct feedback that he has a problem with "hypnotizing" himself into overly dramatic and in fact untrue versions of what is going on, and it probably the greatest adversary to his health and happiness.  Grounded in reassurance that he is welcome, can return, and how.  Meanwhile, we had to address the revelation that he mixed up his pills in a cup and grazed on them (with club soda, definitely not alcohol, by his report) last week in what was apparently a desperate attempt to feel different, not necessarily die, but willing to flirt with death at the time.  Pledged safety now and no intention of suicide or taking such chances again.  Also stated positive future plans and aversion to hospital on grounds that he would not be allowed to do things that matter  to him, like shave, enough to satisfy fears of self-harm and maintain on an outpatient level, with quick return visit to Ms. Hurst and available scheduling with [facility].  First available with me Jan 9.  As of today, seen again by Ms. Hurst, who reports he scheduled with a therapist in [place] in hopes of resuming weekly therapy but wants to keep Jan 9, and no repeat of labile mood or dramatic self-presentation today.  Current interpretation, he is still prone to pursue desperate measures to feel attached and taken care of, in Cluster B fashion, but he may still make decisions independently and is still welcome to return.  The interaction cognitive issues and Cluster B traits, however, may make his continuing care tricky, including keeping a consistent understanding of his issues and how to respond to them, and the ongoing tendency to fragmented and sometimes impulsive self-care decisions.  Blanchie Serve, PhD [name], PhD LP Clinical Psychologist, [facility] [address] [place], [place] [address] [phone]

## 2021-12-17 ENCOUNTER — Other Ambulatory Visit: Payer: Self-pay | Admitting: Medical

## 2021-12-17 ENCOUNTER — Ambulatory Visit (INDEPENDENT_AMBULATORY_CARE_PROVIDER_SITE_OTHER): Payer: Medicare Other | Admitting: Medical

## 2021-12-17 VITALS — BP 110/70 | Wt 196.4 lb

## 2021-12-17 DIAGNOSIS — N1831 Chronic kidney disease, stage 3a: Secondary | ICD-10-CM | POA: Diagnosis not present

## 2021-12-17 DIAGNOSIS — F411 Generalized anxiety disorder: Secondary | ICD-10-CM

## 2021-12-17 DIAGNOSIS — G4733 Obstructive sleep apnea (adult) (pediatric): Secondary | ICD-10-CM

## 2021-12-17 DIAGNOSIS — R270 Ataxia, unspecified: Secondary | ICD-10-CM | POA: Diagnosis not present

## 2021-12-17 DIAGNOSIS — R4701 Aphasia: Secondary | ICD-10-CM | POA: Diagnosis not present

## 2021-12-17 DIAGNOSIS — R4182 Altered mental status, unspecified: Secondary | ICD-10-CM

## 2021-12-17 DIAGNOSIS — Z114 Encounter for screening for human immunodeficiency virus [HIV]: Secondary | ICD-10-CM

## 2021-12-17 DIAGNOSIS — G3184 Mild cognitive impairment, so stated: Secondary | ICD-10-CM

## 2021-12-17 NOTE — Progress Notes (Signed)
Subjective:  Richard Wilson is a 71 y.o. male who presents for Chief Complaint  Patient presents with   speech issues    Speech issues. Can't get the word he wants to say out. This has been going on for 5 months     Here for concerns about his speech.  Patient Care Team: Denita Lung, MD as PCP - General (Family Medicine) [name], MD as Consulting Physician (Hematology and Oncology) Alen Blew as Physician Assistant (Psychiatry) Leandrew Koyanagi, MD as Attending Physician (Orthopedic Surgery) Blanchie Serve, PhD (Psychiatry)  For several months having problems getting the right words.  He will be speaking and either a different word will come out or he will forget the word he was going to use.   Also is forgetfully, forgetting where he placed items in his house.  Happens at least 3-4 times daily or more.    Mouth completely dry sometimes, feels like no liquid in mouth.  When he walks sometimes feels a little off balance. No falls but seems a little off at times.  Lipsing at times as mouth is so dry.  No increased urination but increased thirst.   No blurred vision, hasn't seen eye doctor in 4 years.  Sees psychiatry, and he reports Lithium was added 2 weeks.  No drug use.  Drinks some alcohol, Sherry typically but none of late.    Sometimes gets tingling in right hand, but no other weakness or paresthesia.  No chest pain or dyspnea.    No other aggravating or relieving factors.    No other c/o.  Past Medical History:  Diagnosis Date   Arthritis of knee 08/31/2017   Chronic idiopathic constipation 10/23/2020   Chronic kidney disease, stage 3a 17/91/5056   Diastolic dysfunction 97/94/8016   Diverticulosis of large intestine without hemorrhage 09/30/2013   Elevated PSA 12/22/2012   Erectile dysfunction 10/23/2020   Generalized anxiety disorder 01/01/2020   Hyperglycemia 09/24/2020   Intention tremor    Major depressive disorder 01/01/2020   Mild cognitive  impairment of uncertain or unknown etiology 01/21/2021   Mild mitral regurgitation 08/15/2013   Nephrolithiasis 06/22/2016   Obstructive sleep apnea 04/13/2015   Orthostatic lightheadedness 10/23/2020   Primary osteoarthritis of both hips 07/30/2019   Pure hypercholesterolemia 12/15/2018   RBBB (right bundle branch block) 12/03/2012   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   Thrombocytopenia 04/08/2020   Current Outpatient Medications on File Prior to Visit  Medication Sig Dispense Refill   amitriptyline (ELAVIL) 25 MG tablet TAKE 1 TABLET BY MOUTH EVERYDAY AT BEDTIME 90 tablet 0   B Complex Vitamins (VITAMIN B COMPLEX) TABS Take 1 tablet by mouth daily.     clonazePAM (KLONOPIN) 1 MG tablet Take 1 tablet (1 mg total) by mouth 3 (three) times daily as needed for anxiety. (Patient taking differently: Take 1 mg by mouth in the morning, at noon, and at bedtime.) 90 tablet 2   fluvoxaMINE (LUVOX) 100 MG tablet Take 2 tablets (200 mg total) by mouth at bedtime. 60 tablet 2   hydrocortisone 2.5 % cream Apply 1 Application topically daily.     lamoTRIgine (LAMICTAL) 150 MG tablet TAKE 1 TABLET BY MOUTH EVERY DAY 90 tablet 0   lithium carbonate 150 MG capsule Take 1 capsule (150 mg total) by mouth 2 (two) times daily with a meal. 60 capsule 1   vitamin B-12 (CYANOCOBALAMIN) 1000 MCG tablet Take 1,000 mcg by mouth daily.     Cholecalciferol (VITAMIN D3) 50 MCG (  2000 UT) TABS Take 1 tablet by mouth daily. (Patient not taking: Reported on 12/17/2021)     No current facility-administered medications on file prior to visit.     The following portions of the patient's history were reviewed and updated as appropriate: allergies, current medications, past family history, past medical history, past social history, past surgical history and problem list.  ROS Otherwise as in subjective above    Objective: BP 110/70   Wt 196 lb 6.4 oz (89.1 kg)   BMI 29.00 kg/m   Wt Readings from Last 3 Encounters:   12/17/21 196 lb 6.4 oz (89.1 kg)  11/17/21 192 lb 3.2 oz (87.2 kg)  11/10/21 191 lb 3.2 oz (86.7 kg)   BP Readings from Last 3 Encounters:  12/17/21 110/70  11/17/21 122/80  11/10/21 126/80   General appearance: alert, no distress, well developed, well nourished HEENT: normocephalic, sclerae anicteric, conjunctiva pink and moist, TMs pearly, nares patent, no discharge or erythema, pharynx normal Oral cavity: dryMM, no lesions Neck: supple, no lymphadenopathy, no thyromegaly, no masses Heart: RRR, normal S1, S2, no murmurs Lungs: CTA bilaterally, no wheezes, rhonchi, or rales Abdomen: +bs, soft, non tender, non distended, no masses, no hepatomegaly, no splenomegaly Pulses: 2+ radial pulses, 2+ pedal pulses, normal cap refill Ext: no edema Neuro: CN2-12 intact, trouble following directions with neuro testing at times, but neg romberg,   Assessment: Encounter Diagnoses  Name Primary?   Aphasia Yes   Ataxia    Obstructive sleep apnea    Chronic kidney disease, stage 3a    Generalized anxiety disorder    Mild cognitive impairment of uncertain or unknown etiology    Screening for HIV (human immunodeficiency virus)    Altered mental status, unspecified altered mental status type      Plan: I reviewed his concerns.  MMSE score of 22/29  Reviewed extensive labs he had done in September and October.  Comprehensive metabolic panel in October showed elevated glucose at 145, decreased calcium, slightly decreased albumin, decreased protein, alcohol level negative, CBC normal except for platelets low at 1 45,000 and RDW 16.7, troponin normal  Hemoglobin A1c was 5.5% a month ago  RPR negative on October 28, 2021, B12 level normal also in September 27  TSH within normal limits 6 months ago.  Looking back folate has been low in the past including March 2023   Brain MRI 02/09/21: IMPRESSION: No evidence of acute intracranial abnormality.   Minimal generalized parenchymal volume  loss and cerebral white matter chronic small vessel ischemic changes.   Paranasal sinus disease, as described.    We discussed possible differential (dementia, prior stroke?, electrolyte disturbance, vitamin deficiency, infectious disease or other).  F/u pending labs.     Richard Wilson was seen today for speech issues.  Diagnoses and all orders for this visit:  Aphasia -     Folate -     Vitamin B1 -     VITAMIN D 25 Hydroxy (Vit-D Deficiency, Fractures) -     Comprehensive metabolic panel -     Ammonia -     ANA -     Sjogrens syndrome-A extractable nuclear antibody  Ataxia -     Folate -     Vitamin B1 -     VITAMIN D 25 Hydroxy (Vit-D Deficiency, Fractures) -     Comprehensive metabolic panel -     Ammonia -     ANA -     Sjogrens syndrome-A extractable nuclear antibody  Obstructive  sleep apnea -     Folate -     Vitamin B1 -     VITAMIN D 25 Hydroxy (Vit-D Deficiency, Fractures) -     Comprehensive metabolic panel -     Ammonia -     ANA -     Sjogrens syndrome-A extractable nuclear antibody  Chronic kidney disease, stage 3a -     Folate -     Vitamin B1 -     VITAMIN D 25 Hydroxy (Vit-D Deficiency, Fractures) -     Comprehensive metabolic panel -     Ammonia -     ANA -     Sjogrens syndrome-A extractable nuclear antibody  Generalized anxiety disorder -     Folate -     Vitamin B1 -     VITAMIN D 25 Hydroxy (Vit-D Deficiency, Fractures) -     Comprehensive metabolic panel -     Ammonia -     ANA -     Sjogrens syndrome-A extractable nuclear antibody  Mild cognitive impairment of uncertain or unknown etiology -     Folate -     Vitamin B1 -     VITAMIN D 25 Hydroxy (Vit-D Deficiency, Fractures) -     Comprehensive metabolic panel -     Ammonia -     ANA -     Sjogrens syndrome-A extractable nuclear antibody  Screening for HIV (human immunodeficiency virus)  Altered mental status, unspecified altered mental status type    Follow up: pending labs

## 2021-12-18 ENCOUNTER — Other Ambulatory Visit: Payer: Self-pay | Admitting: Medical

## 2021-12-18 LAB — HIV ANTIBODY (ROUTINE TESTING W REFLEX): HIV Screen 4th Generation wRfx: NONREACTIVE

## 2021-12-18 MED ORDER — VITAMIN D3 50 MCG (2000 UT) PO TABS: 1.0000 | ORAL_TABLET | Freq: Every day | ORAL | 0 refills | Status: DC

## 2021-12-18 MED ORDER — THIAMINE HCL 100 MG PO TABS: 100.0000 mg | ORAL_TABLET | Freq: Every day | ORAL | 1 refills | Status: DC

## 2021-12-18 MED ORDER — FOLIC ACID 1 MG PO TABS: 1.0000 mg | ORAL_TABLET | Freq: Every day | ORAL | 1 refills | Status: DC

## 2021-12-19 ENCOUNTER — Other Ambulatory Visit: Payer: Self-pay | Admitting: Physician Assistant

## 2021-12-21 ENCOUNTER — Telehealth: Payer: Self-pay | Admitting: Family Medicine

## 2021-12-21 LAB — COMPREHENSIVE METABOLIC PANEL
ALT: 23 IU/L (ref 0–44)
AST: 18 IU/L (ref 0–40)
Albumin/Globulin Ratio: 2 (ref 1.2–2.2)
Albumin: 4.3 g/dL (ref 3.9–4.9)
Alkaline Phosphatase: 153 IU/L — ABNORMAL HIGH (ref 44–121)
BUN/Creatinine Ratio: 14 (ref 10–24)
BUN: 18 mg/dL (ref 8–27)
Bilirubin Total: 0.5 mg/dL (ref 0.0–1.2)
CO2: 25 mmol/L (ref 20–29)
Calcium: 9.6 mg/dL (ref 8.6–10.2)
Chloride: 104 mmol/L (ref 96–106)
Creatinine, Ser: 1.3 mg/dL — ABNORMAL HIGH (ref 0.76–1.27)
Globulin, Total: 2.2 g/dL (ref 1.5–4.5)
Glucose: 127 mg/dL — ABNORMAL HIGH (ref 70–99)
Potassium: 4.4 mmol/L (ref 3.5–5.2)
Sodium: 141 mmol/L (ref 134–144)
Total Protein: 6.5 g/dL (ref 6.0–8.5)
eGFR: 59 mL/min/{1.73_m2} — ABNORMAL LOW (ref >59)

## 2021-12-21 LAB — ANA: Anti Nuclear Antibody (ANA): NEGATIVE

## 2021-12-21 LAB — VITAMIN B1: Thiamine: 147.6 nmol/L (ref 66.5–200.0)

## 2021-12-21 LAB — FOLATE: Folate: 4 ng/mL (ref >3.0)

## 2021-12-21 LAB — SJOGRENS SYNDROME-A EXTRACTABLE NUCLEAR ANTIBODY: ENA SSA (RO) Ab: <0.2 AI (ref 0.0–0.9)

## 2021-12-21 LAB — VITAMIN D 25 HYDROXY (VIT D DEFICIENCY, FRACTURES): Vit D, 25-Hydroxy: 24.8 ng/mL — ABNORMAL LOW (ref 30.0–100.0)

## 2021-12-21 LAB — AMMONIA: Ammonia: 65 ug/dL (ref 40–200)

## 2021-12-21 NOTE — Telephone Encounter (Signed)
Pt called and is interested in his recent lab results. I advised you would give him a call when they are ready.

## 2021-12-22 NOTE — Telephone Encounter (Signed)
Pt came in and I gave him copy of results and advised negative.

## 2021-12-29 ENCOUNTER — Other Ambulatory Visit: Payer: Self-pay | Admitting: Physician Assistant

## 2021-12-30 ENCOUNTER — Ambulatory Visit (INDEPENDENT_AMBULATORY_CARE_PROVIDER_SITE_OTHER): Payer: Medicare Other | Admitting: Psychiatry

## 2021-12-30 DIAGNOSIS — F332 Major depressive disorder, recurrent severe without psychotic features: Secondary | ICD-10-CM

## 2021-12-30 DIAGNOSIS — F5105 Insomnia due to other mental disorder: Secondary | ICD-10-CM | POA: Diagnosis not present

## 2021-12-30 DIAGNOSIS — F99 Mental disorder, not otherwise specified: Secondary | ICD-10-CM

## 2021-12-30 DIAGNOSIS — F4010 Social phobia, unspecified: Secondary | ICD-10-CM | POA: Diagnosis not present

## 2021-12-30 DIAGNOSIS — F411 Generalized anxiety disorder: Secondary | ICD-10-CM | POA: Diagnosis not present

## 2021-12-30 DIAGNOSIS — Z91148 Patient's other noncompliance with medication regimen for other reason: Secondary | ICD-10-CM

## 2021-12-30 DIAGNOSIS — Z1589 Genetic susceptibility to other disease: Secondary | ICD-10-CM

## 2021-12-30 DIAGNOSIS — F341 Dysthymic disorder: Secondary | ICD-10-CM

## 2021-12-30 DIAGNOSIS — G3184 Mild cognitive impairment, so stated: Secondary | ICD-10-CM

## 2021-12-30 NOTE — Progress Notes (Signed)
Psychotherapy Progress Note [facility] [name], PhD LP  Patient ID: Richard Wilson [facility] [name])    MRN: [id number] Therapy format: Individual psychotherapy Date: 12/30/2021      Start: 8:14a     Stop: 9:11a     Time Spent: 57 min Location: In-person   Session narrative (presenting needs, interim history, self-report of stressors and symptoms, applications of prior therapy, status changes, and interventions made in session) After 11/6 combined visit with Ms. Adelene Idler and despairing outcry about having wrecked therapy and surely alienated [place], is back on schedule without explanation today.  Begins without reintroduction, goes straight into having word-finding and balance issues, and seeing some other doctor as Dr. Redmond School seems to be fading his practice, preoccupied with why he might have been given an HIV test.  Persuaded to slow down and let me investigate.  EHR investigation turned up his complaint of aphasia, and that he saw a PA in [place] office, and it turns out a slew of tests were run.  Explained clinical decision-making for those tests, provided results, which include moderately low vit D, low normal folate, high ALP, probable false positive for hyperglycemia, and normal tests for Sjogren's, HIV, other items.  Assured it's all WNL for medical decision-making given the complaints he brought to them, and he should not agonize about that.    Socially, says he approached his former priest in [facility], who tried to fix him up with a man from the church there, whom [name] had seen before and found dry, dull, and reluctant.  Allowed to vent, did not query why he went to old church instead of new one for priest's help.  Reinforced staying honest, confronted inappropriate guilt, reinforced his absolute right to choose whether to to date or not and whom.  Encouraged not to throw off on the priest's judgment, as he probably was doing what he thought she was asked, and  it's not even that clear he was trying to fix them up as dates, since the advice was to seek a friend.  Important not to conflate those, even if a man may be both.  Still would be helpful to connect with a community organization like [employer].    C/o being "behind" on technology and noone to teach him, disaffected with church, and feeling God isn't doing anything for him.  Admittedly he would rather die and see his mother soon.  Denies suicidal impulses or any flirting with overdose again.  Money is tight, and wondering if he can keep up paying for his Medicare supplement.  Sold silver recently to keep from dipping into savings.  Finances are ambiguous, hard to tell what his means really are at this point, but assured he can get through this.  Denies difficulty tracking banking at his point, having reckoned with that months ago.  Therapeutic modalities: Cognitive Behavioral Therapy, Solution-Oriented/Positive Psychology, and Ego-Supportive  Mental Status/Observations:  Appearance:   Casual     Behavior:  Restless  Motor:  Normal  Speech/Language:   Clear and Coherent  Affect:  Histrionic in places  Mood:  dysthymic and irritable  Thought process:  Some flight  Thought content:    Rumination  Sensory/Perceptual disturbances:    WNL  Orientation:  Fully oriented  Attention:  Good    Concentration:  Fair  Memory:  grossly intact  Insight:    Fair  Judgment:   Fair  Impulse Control:  Fair   Risk Assessment: Danger to Self: No Self-injurious  Behavior: No Danger to Others: No Physical Aggression / Violence: No Duty to Warn: No Access to Firearms a concern: No  Assessment of progress:  stabilized  Diagnosis:   ICD-10-CM   1. Severe episode of recurrent major depressive disorder, without psychotic features ([place])  F33.2     2. Generalized anxiety disorder  F41.1     3. Insomnia due to other mental disorder  F51.05    F99     4. Social anxiety disorder  F40.10     5.  Heterozygous MTHFR mutation C677T  Z15.89     6. Mild cognitive impairment of uncertain or unknown etiology -- suspect combination of nutritional deficiency, anxiety, sedative effects, and dyssomnia with possible apnea  G31.84     7. 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     8. Variable compliance with medication and medical advice  Z91.148      Plan:  Social connections and morale -- Still should emphasize finding friends over finding a lover.  Check with [place] group.  Free choice about church, just stay clear he may have to dig in a while to truly know what can be.  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  May inquire with new or old priest at discretion about connecting to others in churches, but de-emphasize dating.  If dating, don't have to use app, don't have to agonize, just keep it low key, see what compatibilities and don't rush.especially given frightening sexual history and great inexperience. Medication organization -- Keep meds organized, using a standard pillbox rather than rely on daily dispensing decisions and concentration. Nutrition and hydration -- Prefer methylated [id number] and folic acid based on MTHFR assessment, but understand cost.  Still recommend OTC B complex preferable to B12, and should see if superior doses achieve benefit without methylated supply.  Maintain varied food sources, try to improve protein and vegetable content, restrain late carbs and sodium-laden juices.  Maintain adequate fluids, best use a measurable cup or jug to see, rather than trust intuition and sense of thirst, how much taken in during a day. Sleep hygiene -- Maintain reasonably regular, timely bedtime, and electronics curfew.  Accept rest rather than sleep when not coming easy. 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  Absolutely must re-regulate dosing before trying to wean, and then try much more gradually,  e.g., 1/4 mg at a time, starting with midday or morning, and grade out from there.  Seek psychiatry's advice and partnership on the schedule for weaning. Financial organization -- Continue banking/arithmetic measures, including use of calculator, legible handwriting, and willingness to fresh start with the bank's balance if he strays.  Should approach bank about his annuity and help budgeting. Regret/shame/impulsivity -- Reaffirm the good he tries to do, take heart in partial successes and good efforts, grant self permission to learn, and check drastic decisions before acting on them PCP relationship and medical organization -- Work with PCP in good faith to fully inform and ask necessary questions, fully comprehend medical advice, written instructions may help.  Care coordination with psychiatry and PCP ad lib.  Make sure to comprehend referrals and instructions rather than let uncomfortable feelings drive irrational conclusion-jumping.   Responsible party -- Consider reinstating Juliann Pulse as his next-of-kin, possibly POA. Other recommendations/advice as may be noted above Continue to utilize previously learned skills ad lib Maintain medication as prescribed and work faithfully with relevant prescriber(s) if any changes are  desired or seem indicated Call the clinic on-call service, 988/hotline, 911, or present to [facility] or ER if any life-threatening psychiatric crisis Return for time as available, recommend sched ahead. Already scheduled visit in this office 01/07/2022.  Blanchie Serve, PhD [name], PhD LP Clinical Psychologist, [facility] [address], [place], [place] [address] [phone]

## 2022-01-04 ENCOUNTER — Ambulatory Visit: Payer: Medicare Other | Admitting: Psychiatry

## 2022-01-05 ENCOUNTER — Ambulatory Visit (INDEPENDENT_AMBULATORY_CARE_PROVIDER_SITE_OTHER): Payer: Medicare Other | Admitting: Family Medicine

## 2022-01-05 ENCOUNTER — Inpatient Hospital Stay: Payer: Medicare Other

## 2022-01-05 ENCOUNTER — Inpatient Hospital Stay (HOSPITAL_BASED_OUTPATIENT_CLINIC_OR_DEPARTMENT_OTHER): Payer: Medicare Other | Admitting: Hematology and Oncology

## 2022-01-05 ENCOUNTER — Encounter: Payer: Self-pay | Admitting: Hematology and Oncology

## 2022-01-05 ENCOUNTER — Encounter: Payer: Self-pay | Admitting: Family Medicine

## 2022-01-05 ENCOUNTER — Inpatient Hospital Stay: Payer: Medicare Other | Attending: Hematology and Oncology

## 2022-01-05 ENCOUNTER — Other Ambulatory Visit: Payer: Self-pay

## 2022-01-05 VITALS — BP 110/72 | HR 107 | Wt 194.0 lb

## 2022-01-05 VITALS — BP 123/67 | HR 83 | Temp 97.8°F | Resp 18 | Ht 69.0 in | Wt 196.8 lb

## 2022-01-05 VITALS — BP 135/89 | HR 86 | Resp 18

## 2022-01-05 DIAGNOSIS — F331 Major depressive disorder, recurrent, moderate: Secondary | ICD-10-CM | POA: Diagnosis not present

## 2022-01-05 DIAGNOSIS — G4733 Obstructive sleep apnea (adult) (pediatric): Secondary | ICD-10-CM | POA: Diagnosis not present

## 2022-01-05 DIAGNOSIS — D751 Secondary polycythemia: Secondary | ICD-10-CM | POA: Diagnosis present

## 2022-01-05 DIAGNOSIS — G3184 Mild cognitive impairment, so stated: Secondary | ICD-10-CM

## 2022-01-05 DIAGNOSIS — D45 Polycythemia vera: Secondary | ICD-10-CM

## 2022-01-05 LAB — CBC WITH DIFFERENTIAL/PLATELET
Abs Immature Granulocytes: 0.03 10*3/uL (ref 0.00–0.07)
Basophils Absolute: 0.1 10*3/uL (ref 0.0–0.1)
Basophils Relative: 1 %
Eosinophils Absolute: 0.2 10*3/uL (ref 0.0–0.5)
Eosinophils Relative: 3 %
HCT: 55.6 % — ABNORMAL HIGH (ref 39.0–52.0)
Hemoglobin: 18.7 g/dL — ABNORMAL HIGH (ref 13.0–17.0)
Immature Granulocytes: 0 %
Lymphocytes Relative: 21 %
Lymphs Abs: 1.5 10*3/uL (ref 0.7–4.0)
MCH: 28.9 pg (ref 26.0–34.0)
MCHC: 33.6 g/dL (ref 30.0–36.0)
MCV: 86.1 fL (ref 80.0–100.0)
Monocytes Absolute: 0.6 10*3/uL (ref 0.1–1.0)
Monocytes Relative: 9 %
Neutro Abs: 4.6 10*3/uL (ref 1.7–7.7)
Neutrophils Relative %: 66 %
Platelets: 152 10*3/uL (ref 150–400)
RBC: 6.46 MIL/uL — ABNORMAL HIGH (ref 4.22–5.81)
RDW: 17.2 % — ABNORMAL HIGH (ref 11.5–15.5)
WBC: 7.1 10*3/uL (ref 4.0–10.5)
nRBC: 0 % (ref 0.0–0.2)

## 2022-01-05 NOTE — Progress Notes (Signed)
   Subjective:    Patient ID: Richard Wilson, male    DOB: Nov 12, 1950, 71 y.o.   MRN: [id number]  HPI He is here for follow-up visit.  He is being followed by mental health, seeing Clinton Quant and Donnal Moat for his psychiatric care.  He is followed by Dr. Alvy Bimler for his polycythemia.  He has also been seen in the past by [name].  His previous MRI was done.  He also has a history of OSA but presently is not on CPAP.  This was done when he was living in the [place] area.  He is on multiple psychotropic medications at the present time and recently did have a suicide attempt.  There is a real question as to his medications and him taking them properly.  He admits to missing dosing.  He complains of being quite forgetful and in word searching.   Review of Systems     Objective:   Physical Exam Alert and in no distress.  He did demonstrate some word searching.       Assessment & Plan:  Mild cognitive impairment of uncertain or unknown etiology - Plan: AMB Referral to Chronic Care Management Services  Obstructive sleep apnea  Moderate episode of recurrent major depressive disorder ([place]) - Plan: AMB Referral to Chronic Care Management Services Difficult to assess exactly what is going on.  I think there is some definite cognitive impairment as well as depression.  He will continue to be followed by psychiatry as well as getting counseling.  I will need to follow-up on his sleep apnea and find out what the status of that is as well as get him set up for chronic care management to assess what his needs are at home and to help with medication management.

## 2022-01-05 NOTE — Progress Notes (Signed)
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   The total time spent in the appointment was 20 minutes encounter with patients including review of chart and various tests results, discussions about plan of care and coordination of care plan   All questions were answered. The patient knows to call the clinic with any problems, questions or concerns. No barriers to learning was detected.    [name], MD 12/5/20239:25 AM  INTERVAL HISTORY: Richard Wilson 71 y.o. male returns for further evaluation and treatment for secondary erythrocytosis He feels fine Denies any headaches  SUMMARY OF HEMATOLOGIC HISTORY: Oncology History  Secondary erythrocytosis  04/25/2020 Bone Marrow Biopsy   BONE MARROW, ASPIRATE, CLOT, CORE:  -  Mildly hypercellular bone marrow with trilineage hematopoiesis with adequate maturation  -  See comment   PERIPHERAL BLOOD:  -  Erythrocytosis  -  Minimal thrombocytopenia  -  See CBC data   COMMENT:   The bone marrow shows trilineage hematopoiesis with a full spectrum of maturation, without significant dysplasia or increase in blasts.  There is no definitive morphologic evidence of involvement by a myeloid  neoplasm.  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  Correlation with clinical impressions, other laboratory data (eg. CBC trends), and pending cytogenetic results is recommended.  Cytogenetics are normal     I have reviewed the past medical history, past surgical history, social history and family history with the patient and they are unchanged from previous note.  ALLERGIES:  is allergic to garamycin [gentamicin] and penicillins.  MEDICATIONS:  Current Outpatient Medications  Medication Sig Dispense Refill   clonazePAM (KLONOPIN) 1 MG tablet TAKE 1 TABLET BY MOUTH 3 TIMES A DAY AS NEEDED FOR ANXIETY 90 tablet 1   amitriptyline (ELAVIL) 25 MG tablet TAKE 1 TABLET BY MOUTH EVERYDAY AT BEDTIME 90 tablet 0   B Complex Vitamins (VITAMIN B COMPLEX) TABS Take 1 tablet by mouth daily.     Cholecalciferol (VITAMIN D3) 50 MCG (2000 UT) TABS Take 1 tablet by mouth daily. 90 tablet 0   fluvoxaMINE (LUVOX) 100 MG tablet Take 2 tablets (200 mg total) by mouth at bedtime. 60 tablet 2   folic acid (FOLVITE) 1 MG tablet Take 1 tablet (1 mg total) by mouth daily. 30 tablet 1   hydrocortisone 2.5 % cream Apply 1 Application topically daily.     lamoTRIgine (LAMICTAL) 150 MG tablet TAKE 1 TABLET BY MOUTH EVERY DAY 90 tablet 0   lithium carbonate 150 MG capsule Take 1 capsule (150 mg total) by mouth 2 (two) times daily with a meal. 60 capsule 1   thiamine (VITAMIN B1) 100 MG tablet Take 1 tablet (100 mg total) by mouth daily. 30 tablet 1   vitamin B-12 (CYANOCOBALAMIN) 1000  MCG tablet Take 1,000 mcg by mouth daily.     No current facility-administered medications for this visit.     REVIEW OF SYSTEMS:   Constitutional: Denies fevers, chills or night sweats Eyes: Denies blurriness of vision Ears, nose, mouth, throat, and face: Denies mucositis or sore throat Respiratory: Denies cough, dyspnea or wheezes Cardiovascular: Denies palpitation, chest discomfort or lower extremity  swelling Gastrointestinal:  Denies nausea, heartburn or change in bowel habits Skin: Denies abnormal skin rashes Lymphatics: Denies new lymphadenopathy or easy bruising Neurological:Denies numbness, tingling or new weaknesses Behavioral/Psych: Mood is stable, no new changes  All other systems were reviewed with the patient and are negative.  PHYSICAL EXAMINATION: ECOG PERFORMANCE STATUS: 0 - Asymptomatic  Vitals:   01/05/22 0841  BP: 123/67  Pulse: 83  Resp: 18  Temp: 97.8 F (36.6 C)  SpO2: 95%   Filed Weights   01/05/22 0841  Weight: 196 lb 12.8 oz (89.3 kg)    GENERAL:alert, no distress and comfortable NEURO: alert & oriented x 3 with fluent speech, no focal motor/sensory deficits  LABORATORY DATA:  I have reviewed the data as listed     Component Value Date/Time   NA 141 12/17/2021 1527   K 4.4 12/17/2021 1527   CL 104 12/17/2021 1527   CO2 25 12/17/2021 1527   GLUCOSE 127 (H) 12/17/2021 1527   GLUCOSE 145 (H) 11/02/2021 0459   BUN 18 12/17/2021 1527   CREATININE 1.30 (H) 12/17/2021 1527   CALCIUM 9.6 12/17/2021 1527   PROT 6.5 12/17/2021 1527   ALBUMIN 4.3 12/17/2021 1527   AST 18 12/17/2021 1527   ALT 23 12/17/2021 1527   ALKPHOS 153 (H) 12/17/2021 1527   BILITOT 0.5 12/17/2021 1527   GFRNONAA >60 11/02/2021 0459    No results found for: "SPEP", "UPEP"  Lab Results  Component Value Date   WBC 7.1 01/05/2022   NEUTROABS 4.6 01/05/2022   HGB 18.7 (H) 01/05/2022   HCT 55.6 (H) 01/05/2022   MCV 86.1 01/05/2022   PLT 152 01/05/2022      Chemistry      Component Value Date/Time   NA 141 12/17/2021 1527   K 4.4 12/17/2021 1527   CL 104 12/17/2021 1527   CO2 25 12/17/2021 1527   BUN 18 12/17/2021 1527   CREATININE 1.30 (H) 12/17/2021 1527      Component Value Date/Time   CALCIUM 9.6 12/17/2021 1527   ALKPHOS 153 (H) 12/17/2021 1527   AST 18 12/17/2021 1527   ALT 23 12/17/2021 1527   BILITOT 0.5 12/17/2021 1527

## 2022-01-05 NOTE — Progress Notes (Signed)
0925 Phlebotomy for 500 gm=1 unit of blood via L antecubital over 10 minutes.  Tol well.  Pt had not eating breakfast, was fasting.  Informed not to fast for labs.  Pt given refreshments before phlebotomy.  VS stable.  D/C to home ambulatory & driving.  IV site unremarkable.

## 2022-01-05 NOTE — Assessment & Plan Note (Signed)
His bone marrow biopsy is not consistent with polycythemia vera Secondary erythrocytosis from other causes such as undiagnosed sleep apnea could be a potential cause He would need phlebotomy due to elevated hemoglobin at greater 18 We discussed the risk and benefits of phlebotomy and he is in agreement I recommend him to continue taking 81 mg aspirin daily I will see him again in 6 months for further follow-up

## 2022-01-07 ENCOUNTER — Ambulatory Visit: Payer: Medicare Other | Admitting: Physician Assistant

## 2022-01-08 ENCOUNTER — Telehealth: Payer: Self-pay | Admitting: Family Medicine

## 2022-01-08 NOTE — Telephone Encounter (Signed)
Richard Wilson called and wants to know if you have been able to speak with his psychiatric PA? I did let him know you were out of the office today and would get back with him as soon as you can

## 2022-01-09 ENCOUNTER — Other Ambulatory Visit: Payer: Self-pay | Admitting: Physician Assistant

## 2022-01-11 ENCOUNTER — Telehealth: Payer: Self-pay | Admitting: Family Medicine

## 2022-01-11 ENCOUNTER — Ambulatory Visit (INDEPENDENT_AMBULATORY_CARE_PROVIDER_SITE_OTHER): Payer: Medicare Other | Admitting: Physician Assistant

## 2022-01-11 ENCOUNTER — Encounter: Payer: Self-pay | Admitting: Physician Assistant

## 2022-01-11 VITALS — BP 131/84 | HR 87 | Resp 20 | Ht 69.0 in | Wt 198.0 lb

## 2022-01-11 DIAGNOSIS — R413 Other amnesia: Secondary | ICD-10-CM | POA: Diagnosis not present

## 2022-01-11 DIAGNOSIS — G4733 Obstructive sleep apnea (adult) (pediatric): Secondary | ICD-10-CM

## 2022-01-11 NOTE — Patient Instructions (Addendum)
It was a pleasure to see you today at our office.   Recommendations:  Follow up in 1 y after neuropsych evaluation  Neuropsych evaluation  Drink more water  Agree with tapering Klonopin off  Continue B12 supplements    Whom to call:  Memory  decline, memory medications: Call our office [phone]       For assessment of decision of mental capacity and competency:  Call Dr. Anthoney Harada, geriatric psychiatrist at [phone]  For guidance in geriatric dementia issues please call [facility] [phone]     If you have any severe symptoms of a stroke, or other severe issues such as confusion,severe chills or fever, etc call 911 or go to the ER as you may need to be evaluated further        RECOMMENDATIONS FOR ALL PATIENTS WITH MEMORY PROBLEMS: 1. Continue to exercise (Recommend 30 minutes of walking everyday, or 3 hours every week) 2. Increase social interactions - continue going to [place] and enjoy social gatherings with friends and family 3. Eat healthy, avoid fried foods and eat more fruits and vegetables 4. Maintain adequate blood pressure, blood sugar, and blood cholesterol level. Reducing the risk of stroke and cardiovascular disease also helps promoting better memory. 5. Avoid stressful situations. Live a simple life and avoid aggravations. Organize your time and prepare for the next day in anticipation. 6. Sleep well, avoid any interruptions of sleep and avoid any distractions in the bedroom that may interfere with adequate sleep quality 7. Avoid sugar, avoid sweets as there is a strong link between excessive sugar intake, diabetes, and cognitive impairment We discussed the Mediterranean diet, which has been shown to help patients reduce the risk of progressive memory disorders and reduces cardiovascular risk. This includes eating fish, eat fruits and green leafy vegetables, nuts like almonds and hazelnuts, walnuts, and also use olive oil. Avoid fast  foods and fried foods as much as possible. Avoid sweets and sugar as sugar use has been linked to worsening of memory function.  There is always a concern of gradual progression of memory problems. If this is the case, then we may need to adjust level of care according to patient needs. Support, both to the patient and caregiver, should then be put into place.    FALL PRECAUTIONS: Be cautious when walking. Scan the area for obstacles that may increase the risk of trips and falls. When getting up in the mornings, sit up at the edge of the bed for a few minutes before getting out of bed. Consider elevating the bed at the head end to avoid drop of blood pressure when getting up. Walk always in a well-lit room (use night lights in the walls). Avoid area rugs or power cords from appliances in the middle of the walkways. Use a walker or a cane if necessary and consider physical therapy for balance exercise. Get your eyesight checked regularly.  FINANCIAL OVERSIGHT: Supervision, especially oversight when making financial decisions or transactions is also recommended.  HOME SAFETY: Consider the safety of the kitchen when operating appliances like stoves, microwave oven, and blender. Consider having supervision and share cooking responsibilities until no longer able to participate in those. Accidents with firearms and other hazards in the house should be identified and addressed as well.   ABILITY TO BE LEFT ALONE: If patient is unable to contact 911 operator, consider using LifeLine, or when the need is there, arrange for someone to stay with patients. Smoking is a Estate agent hazard,  consider supervision or cessation. Risk of wandering should be assessed by caregiver and if detected at any point, supervision and safe proof recommendations should be instituted.  MEDICATION SUPERVISION: Inability to self-administer medication needs to be constantly addressed. Implement a mechanism to ensure safe administration of the  medications.   DRIVING: Regarding driving, in patients with progressive memory problems, driving will be impaired. We advise to have someone else do the driving if trouble finding directions or if minor accidents are reported. Independent driving assessment is available to determine safety of driving.   If you are interested in the driving assessment, you can contact the following:  [employer] in [place]  [place] [phone]  [place]  [facility] [phone] or [phone]

## 2022-01-11 NOTE — Progress Notes (Signed)
Assessment/Plan:   Mild Cognitive Impairment   Richard Wilson is a very pleasant 71 y.o. RH male  with prior history of medically induced tremors, secondary polycythemia, and memory impairment presenting today in follow-up for evaluation of memory loss.  Neuropsych evaluation on December 2022 yielded the memory difficulty to be of unclear etiology, but likely a combination of medication side effects, moderate to severe psychiatric distress and acute testing anxiety as well as B12 deficiency among other contributors.  Personally reviewed MRI of the brain in January 2023 was remarkable for minimal parenchymal generalized volume loss, without acute intracranial abnormalities.  He is not on antidementia medications at this time; his MMSE today is 28/30, stable from the cognitive standpoint.    Recommendations:   Follow up in 1 year after Neuropsych evaluation  Repeat Neuropsych evaluation for clarity of diagnosis and disease progression No indication for antidementia medication at this time Agree with tapering Klonopin off as it can contribute to memory changes. Continue to control mood as per Psych  Recommend good control of cardiovascular risk factors.   Continue B12 supplements  Subjective:   This patient is here alone Previous records as well as any outside records available were reviewed prior to todays visit.  He was last seen on 07/21/2021  Any changes in memory since last visit? "May have changed a little bit, sometimes I confuse one word with another, such as, for ex  "redistributing". " He reads frequently but does not do any crossword puzzles or word finding.  repeats oneself?  Endorsed Disoriented when walking into a room? Sometimes " I have no idea what I am looking for especially since last summer" Leaving objects in unusual places?  Patient denies   Ambulates  with difficulty?   Patient denies   Recent falls?  Patient denies   Any head injuries?  Patient denies   History of  seizures?   Patient denies   Wandering behavior?  Patient denies   Patient drives?  No issues driving  Any mood changes since last visit?  Patient denies  Due to see psychotherapist this week. Trying to taper Klonopin off Any worsening depression?:  Patient denies   Hallucinations?  Patient denies   Paranoia?  Patient denies   Patient reports that sleeps well without vivid dreams, REM behavior or sleepwalking   History of sleep apnea?  Patient denies   Any hygiene concerns?  Patient denies   Independent of bathing and dressing?  Endorsed  Does the patient needs help with medications? Patient in charge  Who is in charge of the finances? Patient  is in charge    Any changes in appetite?  Patient denies    Patient have trouble swallowing? Patient denies   Does the patient cook?  Patient denies   Any kitchen accidents such as leaving the stove on? Patient denies   Any headaches?  Patient denies   Double vision? Patient denies   Any focal numbness or tingling?  Patient denies   Chronic back pain Patient denies   Unilateral weakness?  Patient denies   Any tremors?  Patient denies   Any history of anosmia?  Patient denies   Any incontinence of urine?  Patient denies   Any bowel dysfunction?   Patient denies      Patient lives  alone  Last visit June 2023 Richard Wilson is a very pleasant 71 y.o. RH male  seen today in follow up for memory loss. This patient is here alone  Previous  records as well as any outside records available were reviewed prior to todays visit.   Any changes in memory since last visit? About the same.  He reports confusing one word with another, Patient lives with: Patient lives alone repeats oneself?  Denies  Disoriented when walking into a room?  Patient denies   Leaving objects in unusual places?  Patient denies   Ambulates  with difficulty?   Patient denies   Recent falls?  Patient denies   Any head injuries?  Patient denies   History of seizures?   Patient  denies   Wandering behavior?  Patient denies   Patient drives? Endorsed Any mood changes?  Patient denies.  He is under the care of behavioral health.  He is on multiple psychiatric medications, including clonazepam 1 mg 3 times daily (increased on 04/22/2021).  He reports to be more withdrawn  Any worsening depression?:  Patient denies   Hallucinations?  Patient denies   Paranoia?  Patient denies   Patient reports that he sleeps well without vivid dreams, REM behavior or sleepwalking.  He takes 2 naps a day. Any hygiene concerns?  Patient denies   Independent of bathing and dressing?  Endorsed  Does the patient needs help with medications?  Patient in charge  who is in charge of the finances?  Patient is in charge  Any changes in appetite?  Patient denies   Patient have trouble swallowing? Patient denies   Does the patient cook?  Patient denies   Any kitchen accidents such as leaving the stove on? Patient denies   Any headaches?  Patient denies   The double vision? Patient denies   Any focal numbness or tingling?  Patient denies   Chronic back pain Patient denies   Unilateral weakness?  Patient denies   Any tremors?  Patient reports tremors, right greater than left.  However, during this visit, no tremors are noted.  He is on clonazepam, wonder if when he is off of clonazepam the tremors appear.  He is off of Abilify by now. Any history of anosmia?  Patient denies   Any incontinence of urine?  Patient denies   Any bowel dysfunction?   Patient denies    MRI brain wo contrast 02/09/21 No evidence of acute intracranial abnormality.Minimal generalized parenchymal volume loss    Neuropsychological evaluation 01/21/21  Briefly, results suggested a few somewhat isolated and non-specific impairments. These included cognitive flexibility, performance on a simulated bill paying task, encoding (i.e., learning) aspects of verbal memory, and delayed retrieval/recognition across a list-based memory task.  An additional weakness was exhibited across processing speed. While a normative weakness was exhibited across a receptive language task, he only missed a few items overall, making this less likely to be a current functional impairment. Performances were adequate when compared to age-matched peers across domains of attention/concentration, abstract reasoning, safety/judgment, expressive language, visuospatial abilities, and both story and figure-based retrieval/recognition aspects of memory. The etiology for ongoing dysfunction is unclear. There certainly remains the potential that weaknesses are due to a combination of medication side effects, moderate to severe psychiatric distress, and acute testing anxiety. Despite prior efforts to discontinue Klonopin/clonazepam, he continues to be on a fairly large dose ('1mg'$ , three times per day). Cognitive side effects are well established with this medication. Additionally, acute levels of severe anxiety and moderate depression would also negatively impact cognitive abilities, especially across processing speed, cognitive flexibility, and memory. This combined etiology should remain on his differential.    Labs 04/20/21  B12 308, Folate low 4.2, TSH nl 0.85, Hb/Hct 18/53.1  Past Medical History:  Diagnosis Date   Arthritis of knee 08/31/2017   Chronic idiopathic constipation 10/23/2020   Chronic kidney disease, stage 3a 08/01/1599   Diastolic dysfunction 09/32/3557   Diverticulosis of large intestine without hemorrhage 09/30/2013   Elevated PSA 12/22/2012   Erectile dysfunction 10/23/2020   Generalized anxiety disorder 01/01/2020   Hyperglycemia 09/24/2020   Intention tremor    Major depressive disorder 01/01/2020   Mild cognitive impairment of uncertain or unknown etiology 01/21/2021   Mild mitral regurgitation 08/15/2013   Nephrolithiasis 06/22/2016   Obstructive sleep apnea 04/13/2015   Orthostatic lightheadedness 10/23/2020   Primary osteoarthritis of  both hips 07/30/2019   Pure hypercholesterolemia 12/15/2018   RBBB (right bundle branch block) 12/03/2012   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   Thrombocytopenia 04/08/2020     Past Surgical History:  Procedure Laterality Date   APPENDECTOMY     FOOT TENDON SURGERY Right    TONSILLECTOMY       PREVIOUS MEDICATIONS:   CURRENT MEDICATIONS:  Outpatient Encounter Medications as of 01/11/2022  Medication Sig   amitriptyline (ELAVIL) 25 MG tablet TAKE 1 TABLET BY MOUTH EVERYDAY AT BEDTIME   B Complex Vitamins (VITAMIN B COMPLEX) TABS Take 1 tablet by mouth daily.   Cholecalciferol (VITAMIN D3) 50 MCG (2000 UT) TABS Take 1 tablet by mouth daily.   clonazePAM (KLONOPIN) 1 MG tablet TAKE 1 TABLET BY MOUTH 3 TIMES A DAY AS NEEDED FOR ANXIETY   fluvoxaMINE (LUVOX) 100 MG tablet Take 2 tablets (200 mg total) by mouth at bedtime.   folic acid (FOLVITE) 1 MG tablet Take 1 tablet (1 mg total) by mouth daily.   hydrocortisone 2.5 % cream Apply 1 Application topically daily.   lamoTRIgine (LAMICTAL) 150 MG tablet TAKE 1 TABLET BY MOUTH EVERY DAY   lithium carbonate 150 MG capsule Take 1 capsule (150 mg total) by mouth 2 (two) times daily with a meal.   thiamine (VITAMIN B1) 100 MG tablet Take 1 tablet (100 mg total) by mouth daily.   vitamin B-12 (CYANOCOBALAMIN) 1000 MCG tablet Take 1,000 mcg by mouth daily.   No facility-administered encounter medications on file as of 01/11/2022.     Objective:     PHYSICAL EXAMINATION:    VITALS:   Vitals:   01/11/22 0925  BP: 131/84  Pulse: 87  Resp: 20  SpO2: 97%  Weight: 198 lb (89.8 kg)  Height: '5\' 9"'$  (1.753 m)    GEN:  The patient appears stated age and is in NAD. HEENT:  Normocephalic, atraumatic.   Neurological examination:  General: NAD, well-groomed, appears stated age. Orientation: The patient is alert. Oriented to person, place and date Cranial nerves: There is good facial symmetry. no hypomimia.  The speech is fluent and  clear. No aphasia or dysarthria. Fund of knowledge is appropriate. Recent memory impaired and remote memory is normal.  Attention and concentration are normal.  Able to name objects and repeat phrases.  Hearing is intact to conversational tone.    Sensation: Sensation is intact to light touch throughout Motor: Strength is at least antigravity x4. DTR's 2/4 in UE/LE       No data to display             01/11/2022    9:00 AM 01/05/2022    1:29 PM 10/28/2021   12:03 PM  MMSE - Mini Mental State Exam  Orientation to time '5 4 5  '$ Orientation  to Place '5 5 4  '$ Registration '3 3 3  '$ Attention/ Calculation '5 5 5  '$ Recall '2 3 1  '$ Language- name 2 objects '2 2 2  '$ Language- repeat '1 1 1  '$ Language- follow 3 step command '3 2 2  '$ Language- read & follow direction '1 1 1  '$ Write a sentence '1 1 1  '$ Copy design 0 1 1  Total score '28 28 26       '$ Movement examination: Tone: There is normal tone in the UE/LE Abnormal movements: No resting or postural or intention tremors are noted on exam today.  No myoclonus.  No asterixis.   Coordination:  There is no decremation with RAM's. Normal finger to nose  Gait and Station: The patient has no difficulty arising out of a deep-seated chair without the use of the hands. The patient's stride length is good.  Gait is cautious and narrow.   Thank you for allowing [place] the opportunity to participate in the care of this nice patient. Please do not hesitate to contact us for any questions or concerns.   Total time spent on today's visit was 23 minutes dedicated to this patient today, preparing to see patient, examining the patient, ordering tests and/or medications and counseling the patient, documenting clinical information in the EHR or other health record, independently interpreting results and communicating results to the patient/family, discussing treatment and goals, answering patient's questions and coordinating care.  Cc:  Denita Lung, MD  Sharene Butters 01/11/2022 9:56 AM

## 2022-01-11 NOTE — Addendum Note (Signed)
Addended by: Sharene Butters E on: 01/11/2022 03:58 PM   Modules accepted: Level of Service

## 2022-01-11 NOTE — Telephone Encounter (Signed)
He wants to know why you want to speak to his psych, PA

## 2022-01-12 ENCOUNTER — Ambulatory Visit: Payer: Medicare Other | Admitting: Psychiatry

## 2022-01-13 ENCOUNTER — Other Ambulatory Visit: Payer: Self-pay | Admitting: Medical

## 2022-01-13 ENCOUNTER — Ambulatory Visit (INDEPENDENT_AMBULATORY_CARE_PROVIDER_SITE_OTHER): Payer: Medicare Other | Admitting: Psychiatry

## 2022-01-13 DIAGNOSIS — G3184 Mild cognitive impairment, so stated: Secondary | ICD-10-CM

## 2022-01-13 DIAGNOSIS — F99 Mental disorder, not otherwise specified: Secondary | ICD-10-CM

## 2022-01-13 DIAGNOSIS — F411 Generalized anxiety disorder: Secondary | ICD-10-CM | POA: Diagnosis not present

## 2022-01-13 DIAGNOSIS — F5105 Insomnia due to other mental disorder: Secondary | ICD-10-CM

## 2022-01-13 DIAGNOSIS — F331 Major depressive disorder, recurrent, moderate: Secondary | ICD-10-CM | POA: Diagnosis not present

## 2022-01-13 DIAGNOSIS — R37 Sexual dysfunction, unspecified: Secondary | ICD-10-CM

## 2022-01-13 DIAGNOSIS — F4010 Social phobia, unspecified: Secondary | ICD-10-CM

## 2022-01-13 DIAGNOSIS — Z91148 Patient's other noncompliance with medication regimen for other reason: Secondary | ICD-10-CM

## 2022-01-13 DIAGNOSIS — F341 Dysthymic disorder: Secondary | ICD-10-CM | POA: Diagnosis not present

## 2022-01-13 DIAGNOSIS — Z1589 Genetic susceptibility to other disease: Secondary | ICD-10-CM

## 2022-01-13 NOTE — Telephone Encounter (Signed)
Is this okay to refill? 

## 2022-01-13 NOTE — Progress Notes (Signed)
Psychotherapy Progress Note [facility] [name], PhD LP  Patient ID: Dixie Schnipke [facility] [name]    MRN: [id number] Therapy format: Individual psychotherapy Date: 01/13/2022      Start: 8:15a     Stop: 9:15a     Time Spent: 60 min Location: In-person   Session narrative (presenting needs, interim history, self-report of stressors and symptoms, applications of prior therapy, status changes, and interventions made in session) Unexpectedly complains of seeing another doctor now, turns out to be neurology PA at [facility], on referral of PCP.  Doesn't understand why or what it's about.  Discussed medical needs, clarifying his ROI to PCP (Dr. Redmond School, phone call yesterday).  Reviewed medical records at some length to clarify his needs, filled in about followup neurology given that he complained of aphasia and memory lapses a couple weeks back to PCP.  Also delved into referral for sleep study and historical information about possible sleep apnea.  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  Encouraged to get instructions clear and follow through with home sleep study, don't count on being able to insist on one in the lab, as the home study may be operable, valid, and sufficient, don't catastrophize based on fragmentary memory of past experience.  Meanwhile, past 3 days or so he has suddenly been been trying to skip midday Klonopin, but is finding that he gets agitated by nightfall.  Supportively confronted that, while it is laudable to want to get off Klonopin, he is once again trying to do too much, too fast.  Rec'd try a half dose, only midday, and let the other two doses remain as they have been, for reinforcing sleep and starting the day steady.  Fielded questions about his porn habit, which remains active in the evenings, even if he is sexually nonfunctional.   Suggested it is a Retail banker for loneliness as an inexperienced and now older gay man with intimidating experiences, but that it is harmless unless it stokes up agonizing or resentment.  Probed whether it is violent, subservient, or otherwise demeaning in nature, says it's mostly just eye candy and a chance to feel lust, though it is pretty much every night.  Encouraged discretion, notice if it seems to erode or corrode him.  Therapeutic modalities: Cognitive Behavioral Therapy, Solution-Oriented/Positive Psychology, Ego-Supportive, Psycho-education/Bibliotherapy, and Faith-sensitive  Mental Status/Observations:  Appearance:   Casual     Behavior:  Appropriate  Motor:  Normal  Speech/Language:   Clear and Coherent  Affect:  Depressed  Mood:  depressed and irritable  Thought process:  normal  Thought content:    Rumination  Sensory/Perceptual disturbances:    WNL  Orientation:  Fully oriented  Attention:  Good    Concentration:  Fair  Memory:  grossly intact  Insight:    Fair  Judgment:   Fair  Impulse Control:  Fair   Risk Assessment: Danger to Self: No Self-injurious Behavior: No Danger to Others: No Physical Aggression / Violence: No Duty to Warn: No Access to Firearms a concern: No  Assessment of progress:  stabilized  Diagnosis:   ICD-10-CM   1. Major depressive disorder, recurrent episode, moderate (HCC)  F33.1     2. Generalized anxiety disorder  F41.1     3. 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 Social anxiety disorder  F40.10     5. Insomnia due to other mental disorder  F51.05    F99     6. Mild cognitive impairment of uncertain or unknown etiology -- suspect combination of nutritional deficiency, anxiety, sedative effects, and dyssomnia with possible apnea  G31.84     7. Variable compliance with medication and medical advice  Z91.148     8. Heterozygous MTHFR mutation C677T  Z15.89     9. Sexual dysfunction  R37      Plan:  Social connections and morale  -- Still should emphasize finding friends over finding a lover.  Check with [place] group.  Free choice about church, just stay clear he may have to dig in a while to truly know what can be.  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  May inquire with new or old priest at discretion about connecting to others in churches, but de-emphasize dating.  If dating, don't have to use app, don't have to agonize, just keep it low key, see what compatibilities and don't rush.especially given frightening sexual history and great inexperience. Medication organization -- Keep meds organized, using a standard pillbox rather than rely on daily dispensing decisions and concentration. Nutrition and hydration -- Prefer methylated [id number] and folic acid based on MTHFR assessment, but understand cost.  Still recommend OTC B complex preferable to B12, and should see if superior doses achieve benefit without methylated supply.  Maintain varied food sources, try to improve protein and vegetable content, restrain late carbs and sodium-laden juices.  Maintain adequate fluids, best use a measurable cup or jug to see, rather than trust intuition and sense of thirst, how much taken in during a day. Sleep hygiene -- Maintain reasonably regular, timely bedtime, and electronics curfew.  Accept rest rather than sleep when not coming easy.  Curfew video, including porn watching. 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  Absolutely must re-regulate dosing before trying to wean, and then try much more gradually, e.g., 1/4 mg at a time, starting with midday or morning, and grade out from there.  Seek psychiatry's advice and partnership on the schedule for weaning. Financial organization -- Continue banking/arithmetic measures, including use of calculator, legible handwriting, and willingness to fresh start with the bank's balance if he strays.  Should  approach bank about his annuity and help budgeting. Regret/shame/impulsivity -- Reaffirm the good he tries to do, take heart in partial successes and good efforts, grant self permission to learn, and check drastic decisions before acting on them.  Notice if porn watching becomes apoint of self-castigation and/or addiction. PCP relationship and medical organization -- Work with PCP in good faith to fully inform and ask necessary questions, fully comprehend medical advice, written instructions may help.  Care coordination with psychiatry and PCP ad lib.  Make sure to comprehend referrals and instructions rather than let uncomfortable feelings drive irrational conclusion-jumping.   Responsible party -- Consider reinstating Juliann Pulse as his next-of-kin, possibly POA. Other recommendations/advice as may be noted above Continue to utilize previously learned skills ad lib Maintain medication as prescribed and work faithfully with relevant prescriber(s) if any changes are desired or seem indicated Call the clinic on-call service, 988/hotline, 911, or present to [facility] or ER if any life-threatening psychiatric crisis Return in about 1 week (around 01/20/2022) for time as available. Already scheduled visit in this office 02/09/2022.  Blanchie Serve, PhD [name], PhD LP Clinical Psychologist, [place] [facility] [phone]  [place], [place] [address] ([phone]

## 2022-01-19 ENCOUNTER — Ambulatory Visit: Payer: Medicare Other | Admitting: Psychiatry

## 2022-01-19 ENCOUNTER — Telehealth: Payer: Self-pay | Admitting: Family Medicine

## 2022-01-19 NOTE — Telephone Encounter (Signed)
Will keep my eye out for it to come in.  Dm/cma

## 2022-01-19 NOTE — Telephone Encounter (Signed)
Caller Name: Annabell Sabal w/[facility] Call back phone #: [phone]  Reason for Call: She is sending back addendum order for 7/16-9/13 [facility] episode. This was based on referral/order from 08/07/2021 by Dr. Gena Fray. [facility] cannot have the order signed by new PCP as it was ordered by Dr. Gena Fray. If he does not sign they are unable to bill Medicare for care provided.

## 2022-01-19 NOTE — Telephone Encounter (Signed)
error 

## 2022-01-20 ENCOUNTER — Ambulatory Visit (INDEPENDENT_AMBULATORY_CARE_PROVIDER_SITE_OTHER): Payer: Medicare Other | Admitting: Psychiatry

## 2022-01-20 DIAGNOSIS — F341 Dysthymic disorder: Secondary | ICD-10-CM

## 2022-01-20 DIAGNOSIS — F331 Major depressive disorder, recurrent, moderate: Secondary | ICD-10-CM

## 2022-01-20 DIAGNOSIS — F411 Generalized anxiety disorder: Secondary | ICD-10-CM

## 2022-01-20 DIAGNOSIS — Z91148 Patient's other noncompliance with medication regimen for other reason: Secondary | ICD-10-CM

## 2022-01-20 DIAGNOSIS — G3184 Mild cognitive impairment, so stated: Secondary | ICD-10-CM

## 2022-01-20 DIAGNOSIS — F4010 Social phobia, unspecified: Secondary | ICD-10-CM | POA: Diagnosis not present

## 2022-01-20 DIAGNOSIS — F99 Mental disorder, not otherwise specified: Secondary | ICD-10-CM

## 2022-01-20 DIAGNOSIS — Z1589 Genetic susceptibility to other disease: Secondary | ICD-10-CM

## 2022-01-20 DIAGNOSIS — Z79899 Other long term (current) drug therapy: Secondary | ICD-10-CM

## 2022-01-20 DIAGNOSIS — F5105 Insomnia due to other mental disorder: Secondary | ICD-10-CM

## 2022-01-20 NOTE — Telephone Encounter (Signed)
[place], they will re-fax to [place] to be signed. Dm/cma

## 2022-01-20 NOTE — Telephone Encounter (Signed)
Form faxed to [facility] @ [phone].Dm/cma

## 2022-01-20 NOTE — Telephone Encounter (Signed)
Received form and placed on providers desk to sign. Dm/cma

## 2022-01-20 NOTE — Progress Notes (Signed)
Psychotherapy Progress Note [facility] [name], PhD LP  Patient ID: Richard Wilson [facility] [name])    MRN: [id number] Therapy format: Individual psychotherapy Date: 01/20/2022      Start: 10:14a     Stop: 11:00a     Time Spent: 46 min Location: In-person   Session narrative (presenting needs, interim history, self-report of stressors and symptoms, applications of prior therapy, status changes, and interventions made in session) EHR shows activity getting a home health order worked out, presumably serving new interest in sleep study.  Scheduled now for a sleep study appt next Friday he thinks may be a sleep center but seems to be just the initial consultation to get take-home equipment.  Clarified questions to ask.  C/o dry mouth today, interpreted most likely about mouth breathing and very dry indoor air.  Provided water in session.  Decided to go back to his old church in [place], [place], on Sunday.  Was welcomed, and missed, which he appreciated.  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  Mortified thinking about it, believes he is going to be counted on now that he has offered.  Advised he check on that before catastrophizing.  They may not need him, and it does seem to be meant for youth, not adults, let alone older, but surely they will be gracious about it if he needs to change his mind, or own up to a misperception.  Just ask, and find out it's not a big deal to reverse himself.  Meanwhile, say he has felt like dying again.  Been faced with increased rent and a surprise charge for water and utilities.    Has thought about getting a cat, after impulsively thinking he could take in a stray that happened by.  But trying to temper his dreams.  Knows he failed once before trying to adopt a cat.  Still can wish he had taken this one in, but persuaded to research it first, really know what it takes, especially  since it would be further cost, just when he says he is more strapped.  Impulsively asked out again the man whom Father Rob set him up with, but mutually agreed it's not a match, not interested.  Reinforced getting the matter settled.  Would be one thing if he could look at him as friend material and take the pressure off, but does not even see him that way.  Encouraged let it be settled, don't second-guess or agonize about it, he has the perfect right to pursue or not pursue, nothing owed.  Returning to cat idea, agreed to try out a no-obligation visit to a local pet store to just interact with available animals and reality-test his interactions.  Form there, he can get in touch with [employer] and others to price pet adoption without making a premature commitment.  Encouraged to dig in and really inventory costs and what adoption, furnishings, food, and health care really entail.    Also resolved he will get in touch with the church pageant director to clarify expectations and rescind the offer if need be.    Xmas plans are solitary, but says he is OK with that.  Presumably worshipping again at [facility], clear he has no apprehension about being arm-twisted to serve, or and clear he will not drive himself to urgently volunteer and overcommit.    Despite the other night's feeling like dying, no indication  of any renewed suicidal impulses, and he assures safety.  Positive future plans include his interest in sleep assessment and in treating apnea if found.  Noted also to be more freely wearing women's jewelry these days, suggesting he is somewhat more at ease being out as gay at this point, presumably further moral victory over old days of stigma.  Therapeutic modalities: Cognitive Behavioral Therapy, Solution-Oriented/Positive Psychology, and Ego-Supportive  Mental Status/Observations:  Appearance:   Casual     Behavior:  Appropriate  Motor:  Normal  Speech/Language:   Clear and Coherent   Affect:  Appropriate  Mood:  dysthymic but better energy  Thought process:  normal  Thought content:    Delusions and mild tangents  Sensory/Perceptual disturbances:    WNL  Orientation:  Fully oriented  Attention:  Good    Concentration:  Fair  Memory:  grossly intact  Insight:    Fair  Judgment:   Good  Impulse Control:  Fair   Risk Assessment: Danger to Self: No Self-injurious Behavior: No Danger to Others: No Physical Aggression / Violence: No Duty to Warn: No Access to Firearms a concern: No  Assessment of progress:  progressing tentatively  Diagnosis:   ICD-10-CM   1. Major depressive disorder, recurrent episode, moderate (HCC)  F33.1     2. 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 Generalized anxiety disorder  F41.1     4. Social anxiety disorder  F40.10     5. Insomnia due to other mental disorder  F51.05    F99     6. Mild cognitive impairment of uncertain or unknown etiology -- suspect combination of nutritional deficiency, anxiety, sedative effects, and dyssomnia with possible apnea  G31.84     7. Variable compliance with medication and medical advice  Z91.148     8. Heterozygous MTHFR mutation C677T  Z15.89     9. Long-term current use of benzodiazepine  Z79.899      Plan:  Safety -- Pledge no self-harm, has positive interests, self-care directions, and future plans. Social connections and morale -- Still should emphasize finding friends over finding a lover.  Check with [place] [employer] [facility].  Free choice about church, just stay clear he may have to dig in a while to truly know what can be.  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  May inquire with new or old priest at discretion about connecting to others in churches, but de-emphasize dating.  If dating, don't have to use app, don't have to agonize, just keep it low key, see what compatibilities and don't rush.especially given frightening sexual history and great  inexperience.  Do not rush into getting cat -- assess costs and responsibility first.  OK to rescind his offer of Christmas pageant, trust it is not social suicide to do so. Medication organization -- Keep meds organized, using a standard pillbox rather than rely on daily dispensing decisions and concentration. Nutrition and hydration -- Prefer methylated 123456 and folic acid based on MTHFR assessment, but understand cost.  Still recommend OTC B complex preferable to B12, and should see if superior doses achieve benefit without methylated supply.  Maintain varied food sources, try to improve protein and vegetable content, restrain late carbs and sodium-laden juices.  Maintain adequate fluids, best use a measurable cup or jug to see, rather than trust intuition and sense of thirst, how much taken in during a day. Sleep hygiene -- Maintain reasonably regular, timely bedtime, and  electronics curfew.  Accept rest rather than sleep when not coming easy.  Curfew video, including porn watching. 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  Absolutely must re-regulate dosing before trying to wean, and then try much more gradually, e.g., 1/4 mg at a time, starting with midday or morning, and grade out from there.  Seek psychiatry's advice and partnership on the schedule for weaning. Financial organization -- Continue banking/arithmetic measures, including use of calculator, legible handwriting, and willingness to fresh start with the bank's balance if he strays.  Should approach bank about his annuity and help budgeting. Regret/shame/impulsivity -- Reaffirm the good he tries to do, take heart in partial successes and good efforts, grant self permission to learn, and check drastic decisions before acting on them.  Notice if porn watching becomes a point of self-castigation and/or addiction. PCP relationship and medical organization -- Work with PCP in good faith to fully  inform and ask necessary questions, fully comprehend medical advice, written instructions may help.  Care coordination with psychiatry and PCP ad lib.  Make sure to comprehend referrals and instructions rather than let uncomfortable feelings drive irrational conclusion-jumping.   Responsible party -- Consider reinstating Juliann Pulse as his next-of-kin, possibly POA. Other recommendations/advice as may be noted above Continue to utilize previously learned skills ad lib Maintain medication as prescribed and work faithfully with relevant prescriber(s) if any changes are desired or seem indicated Call the clinic on-call service, 988/hotline, 911, or present to [facility] or ER if any life-threatening psychiatric crisis Return for as already scheduled. Already scheduled visit in this office 02/09/2022.  Blanchie Serve, PhD [name], PhD LP Clinical Psychologist, [facility] [address] [place], [place] [address] [phone]

## 2022-01-26 ENCOUNTER — Telehealth: Payer: Self-pay

## 2022-01-26 NOTE — Progress Notes (Signed)
  Chronic Care Management   Note  01/26/2022 Name: Richard Wilson MRN: [id number] DOB: 20-Mar-1950  Richard Wilson is a 71 y.o. year old male who is a primary care patient of Denita Lung, MD. I reached out to Armando Reichert by phone today in response to a referral sent by Richard Wilson PCP.  Richard Wilson was given information about Chronic Care Management services today including:  CCM service includes personalized support from designated clinical staff supervised by the physician, including individualized plan of care and coordination with other care providers 24/7 contact phone numbers for assistance for urgent and routine care needs. Service will only be billed when office clinical staff spend 20 minutes or more in a month to coordinate care. Only one practitioner may furnish and bill the service in a calendar month. The patient may stop CCM services at amy time (effective at the end of the month) by phone call to the office staff. The patient will be responsible for cost sharing (co-pay) or up to 20% of the service fee (after annual deductible is met)  Richard Wilson  declinedto scheduling an appointment with the CCM RN Case Manager and pharm d   Follow up plan: Patient did not agree to scheduling an appointment with the RN Case Manager and pharm d . The ordering provider has been notified.   Noreene Larsson, [place], [place] [address] Direct Dial: [phone] [email or web address]

## 2022-01-29 ENCOUNTER — Ambulatory Visit: Payer: Medicare Other | Admitting: Psychiatry

## 2022-01-29 ENCOUNTER — Ambulatory Visit (HOSPITAL_BASED_OUTPATIENT_CLINIC_OR_DEPARTMENT_OTHER): Payer: Medicare Other | Attending: Family Medicine | Admitting: Internal Medicine

## 2022-01-29 DIAGNOSIS — G4736 Sleep related hypoventilation in conditions classified elsewhere: Secondary | ICD-10-CM | POA: Insufficient documentation

## 2022-01-29 DIAGNOSIS — R0683 Snoring: Secondary | ICD-10-CM | POA: Diagnosis not present

## 2022-01-29 DIAGNOSIS — R400 Somnolence: Secondary | ICD-10-CM | POA: Diagnosis not present

## 2022-01-29 DIAGNOSIS — G4733 Obstructive sleep apnea (adult) (pediatric): Secondary | ICD-10-CM | POA: Diagnosis not present

## 2022-02-03 ENCOUNTER — Ambulatory Visit (INDEPENDENT_AMBULATORY_CARE_PROVIDER_SITE_OTHER): Payer: Medicare Other | Admitting: Psychiatry

## 2022-02-03 DIAGNOSIS — F411 Generalized anxiety disorder: Secondary | ICD-10-CM | POA: Diagnosis not present

## 2022-02-03 DIAGNOSIS — Z79899 Other long term (current) drug therapy: Secondary | ICD-10-CM

## 2022-02-03 DIAGNOSIS — Z91148 Patient's other noncompliance with medication regimen for other reason: Secondary | ICD-10-CM

## 2022-02-03 DIAGNOSIS — F341 Dysthymic disorder: Secondary | ICD-10-CM

## 2022-02-03 DIAGNOSIS — F4010 Social phobia, unspecified: Secondary | ICD-10-CM

## 2022-02-03 DIAGNOSIS — F331 Major depressive disorder, recurrent, moderate: Secondary | ICD-10-CM | POA: Diagnosis not present

## 2022-02-03 DIAGNOSIS — F99 Mental disorder, not otherwise specified: Secondary | ICD-10-CM

## 2022-02-03 DIAGNOSIS — F5105 Insomnia due to other mental disorder: Secondary | ICD-10-CM

## 2022-02-03 DIAGNOSIS — Z1589 Genetic susceptibility to other disease: Secondary | ICD-10-CM

## 2022-02-03 DIAGNOSIS — G3184 Mild cognitive impairment, so stated: Secondary | ICD-10-CM

## 2022-02-03 NOTE — Progress Notes (Signed)
Psychotherapy Progress Note [facility] [name], PhD LP  Patient ID: Richard Wilson [facility] [name])    MRN: [id number] Therapy format: Individual psychotherapy Date: 02/03/2022      Start: 6:11p     Stop: 7:02p     Time Spent: 51 min Location: In-person   Session narrative (presenting needs, interim history, self-report of stressors and symptoms, applications of prior therapy, status changes, and interventions made in session) Scheduled early off wait list today.  Recent coordination with PCP suggested home assessment for meds and compliance, and apparently, per EHR, he had a chronic care contact last Tuesday.  Note indicates he declined services outright.  Not clear if he comprehended the purpose or just felt self-conscious or at risk for expenses.    Sleep study consultation last Friday, no note available as yet.  In person, [name] notes he had the take home sleep test, has a question what to expect.  Discussed likely findings and recommendations, previewed his options for seep apnea treatment, most likely standard CPAP, what assistance he would get with it and support available to learn how to work it.    Still recommend assessing cat ownership realities and getting no-obligation experience at [employer] before making any commitments for a pet, but agree having a companion animal could be very helpful if it works.  Wound up participating in the Christmas pageant after all.  Felt he was honor bound not to back out, and it was awkward being the only adult, older at that, in the production.  Reframed a gift to the kids, then, obviously accommodated by the church, and no harm done.  Compared to [facility], [place] own experiences of adults coming up for children's sermon when no kids are present.  Therapeutic modalities: Cognitive Behavioral Therapy, Solution-Oriented/Positive Psychology, Customer service manager, and Faith-sensitive  Mental Status/Observations:  Appearance:   Casual      Behavior:  Appropriate  Motor:  Normal  Speech/Language:   Clear and Coherent  Affect:  Appropriate  Mood:  anxious and dysthymic  Thought process:  normal  Thought content:    Rumination  Sensory/Perceptual disturbances:    WNL  Orientation:  Fully oriented  Attention:  Good    Concentration:  Fair  Memory:  WNL  Insight:    Fair  Judgment:   Fair  Impulse Control:  Fair   Risk Assessment: Danger to Self: No Self-injurious Behavior: No Danger to Others: No Physical Aggression / Violence: No Duty to Warn: No Access to Firearms a concern: No  Assessment of progress:  progressing  Diagnosis:   ICD-10-CM   1. Major depressive disorder, recurrent episode, moderate (HCC)  F33.1     2. 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 Generalized anxiety disorder  F41.1     4. Social anxiety disorder  F40.10     5. Insomnia due to other mental disorder  F51.05    F99     6. Mild cognitive impairment of uncertain or unknown etiology -- suspect combination of nutritional deficiency, anxiety, sedative effects, and dyssomnia with possible apnea  G31.84     7. Variable compliance with medication and medical advice  Z91.148     8. Heterozygous MTHFR mutation C677T  Z15.89     9. Long-term current use of benzodiazepine  Z79.899      Plan:  Safety -- Seems more sedate, no urges at this time, safer to entrust with positive future plans.  Pledge no self-harm, has positive interests, self-care directions,  and future plans. Social connections and morale -- Still should emphasize finding friends over finding a lover.  Check with [employer] [facility].  Free choice about church, just stay clear he may have to dig in a while to truly know what can be.  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  May inquire with new or old priest at discretion about connecting to others in churches, but de-emphasize dating.  If dating, don't have to use app, don't have to agonize,  just keep it low key, see what compatibilities and don't rush.especially given frightening sexual history and great inexperience.  Do not rush into getting cat -- assess costs and responsibility first.   Medication organization -- Keep meds organized, using a standard pillbox rather than rely on daily dispensing decisions and concentration. Nutrition and hydration -- Prefer methylated 123456 and folic acid based on MTHFR assessment, but understand cost.  Still recommend OTC B complex preferable to B12, and should see if superior doses achieve benefit without methylated supply.  Maintain varied food sources, try to improve protein and vegetable content, restrain late carbs and sodium-laden juices.  Maintain adequate fluids, best use a measurable cup or jug to see, rather than trust intuition and sense of thirst, how much taken in during a day. Sleep hygiene -- Maintain reasonably regular, timely bedtime, and electronics curfew.  Accept rest rather than sleep when not coming easy.  Curfew video, including porn watching. 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  Absolutely must re-regulate dosing before trying to wean, and then try much more gradually, e.g., 1/4 mg at a time, starting with midday or morning, and grade out from there.  Seek psychiatry's advice and partnership on the schedule for weaning. Financial organization -- Continue banking/arithmetic measures, including use of calculator, legible handwriting, and willingness to fresh start with the bank's balance if he strays.  Should approach bank about his annuity and help budgeting. Regret/shame/impulsivity -- Reaffirm the good he tries to do, take heart in partial successes and good efforts, grant self permission to learn, and check drastic decisions before acting on them.  Notice if porn watching becomes a point of self-castigation and/or addiction. PCP relationship and medical organization -- Work with  PCP in good faith to fully inform and ask necessary questions, fully comprehend medical advice, written instructions may help.  Care coordination with psychiatry and PCP ad lib.  Make sure to comprehend referrals and instructions rather than let uncomfortable feelings drive irrational conclusion-jumping.   Responsible party -- Consider reinstating Juliann Pulse as his next-of-kin, possibly POA. Other recommendations/advice as may be noted above Continue to utilize previously learned skills ad lib Maintain medication as prescribed and work faithfully with relevant prescriber(s) if any changes are desired or seem indicated Call the clinic on-call service, 988/hotline, 911, or present to [facility] or ER if any life-threatening psychiatric crisis No follow-ups on file. Already scheduled visit in this office 02/09/2022.  Blanchie Serve, PhD [name], PhD LP Clinical Psychologist, [facility] [address] [place], [place] [address] [phone]

## 2022-02-06 ENCOUNTER — Other Ambulatory Visit: Payer: Self-pay | Admitting: Physician Assistant

## 2022-02-06 DIAGNOSIS — G4733 Obstructive sleep apnea (adult) (pediatric): Secondary | ICD-10-CM

## 2022-02-06 NOTE — Procedures (Signed)
   Patient Name: Richard Wilson, Richard Wilson Date: 01/29/2022 Gender: Male D.O.B: May 28, 1950 Age (years): 10 Referring Provider: Denita Lung Height (inches): 44 Interpreting Physician: Baird Lyons MD, ABSM Weight (lbs): 192 RPSGT: Jacolyn Reedy BMI: 28 MRN: [id number] Neck Size: 16.00  CLINICAL INFORMATION Sleep Study Type: HST Indication for sleep study: Somnolence Epworth Sleepiness Score: 3  SLEEP STUDY TECHNIQUE A multi-channel overnight portable sleep study was performed. The channels recorded were: nasal airflow, thoracic respiratory movement, and oxygen saturation with a pulse oximetry. Snoring was also monitored.  MEDICATIONS Patient self administered medications include: none reported.  SLEEP ARCHITECTURE Patient was studied for 512.5 minutes. The sleep efficiency was 100.0 % and the patient was supine for 0%. The arousal index was 0.0 per hour.  RESPIRATORY PARAMETERS The overall AHI was 19.7 per hour, with a central apnea index of 0 per hour. The oxygen nadir was 32% during sleep.  CARDIAC DATA Mean heart rate during sleep was 76.1 bpm.  IMPRESSIONS - Moderate obstructive sleep apnea occurred during this study (AHI = 19.7/h). - Severe oxygen desaturation was noted during this study (Min O2 = 32%). Mean 79%. Time with O2 saturation 89% or less was 108 minutes. - Patient snored.  DIAGNOSIS - Obstructive Sleep Apnea (G47.33) - Nocturnal Hypoxemia (G47.36)  RECOMMENDATIONS - Suggest CPAP titration sleep study, which will document for insurance if supplemental O2 is also appropriate. - Evaluate reasons for significant nocturnal hypoxemia. - Sleep hygiene should be reviewed to assess factors that may improve sleep quality. - Weight management and regular exercise should be initiated or continued.  [Electronically signed] 02/06/2022 01:04 PM  Baird Lyons MD, [place], American Board of Sleep Medicine NPI: [id number]                           [place], [place] of Sleep Medicine  ELECTRONICALLY SIGNED ON:  02/06/2022, 1:00 PM [place] PH: [phone]   FX: [phone] [place]

## 2022-02-09 ENCOUNTER — Ambulatory Visit (INDEPENDENT_AMBULATORY_CARE_PROVIDER_SITE_OTHER): Payer: Medicare Other | Admitting: Family Medicine

## 2022-02-09 ENCOUNTER — Ambulatory Visit (INDEPENDENT_AMBULATORY_CARE_PROVIDER_SITE_OTHER): Payer: Medicare Other | Admitting: Psychiatry

## 2022-02-09 ENCOUNTER — Telehealth: Payer: Self-pay | Admitting: Physician Assistant

## 2022-02-09 ENCOUNTER — Other Ambulatory Visit: Payer: Self-pay

## 2022-02-09 ENCOUNTER — Encounter: Payer: Self-pay | Admitting: Family Medicine

## 2022-02-09 VITALS — BP 134/86 | HR 74 | Temp 97.4°F | Resp 18 | Wt 197.0 lb

## 2022-02-09 DIAGNOSIS — F411 Generalized anxiety disorder: Secondary | ICD-10-CM | POA: Diagnosis not present

## 2022-02-09 DIAGNOSIS — F5105 Insomnia due to other mental disorder: Secondary | ICD-10-CM

## 2022-02-09 DIAGNOSIS — K219 Gastro-esophageal reflux disease without esophagitis: Secondary | ICD-10-CM | POA: Diagnosis not present

## 2022-02-09 DIAGNOSIS — F4010 Social phobia, unspecified: Secondary | ICD-10-CM | POA: Diagnosis not present

## 2022-02-09 DIAGNOSIS — F99 Mental disorder, not otherwise specified: Secondary | ICD-10-CM

## 2022-02-09 DIAGNOSIS — Z79899 Other long term (current) drug therapy: Secondary | ICD-10-CM

## 2022-02-09 DIAGNOSIS — Z91148 Patient's other noncompliance with medication regimen for other reason: Secondary | ICD-10-CM

## 2022-02-09 DIAGNOSIS — R69 Illness, unspecified: Secondary | ICD-10-CM

## 2022-02-09 DIAGNOSIS — F341 Dysthymic disorder: Secondary | ICD-10-CM | POA: Diagnosis not present

## 2022-02-09 DIAGNOSIS — G4733 Obstructive sleep apnea (adult) (pediatric): Secondary | ICD-10-CM | POA: Diagnosis not present

## 2022-02-09 DIAGNOSIS — N529 Male erectile dysfunction, unspecified: Secondary | ICD-10-CM

## 2022-02-09 DIAGNOSIS — F331 Major depressive disorder, recurrent, moderate: Secondary | ICD-10-CM

## 2022-02-09 DIAGNOSIS — J40 Bronchitis, not specified as acute or chronic: Secondary | ICD-10-CM

## 2022-02-09 DIAGNOSIS — G3184 Mild cognitive impairment, so stated: Secondary | ICD-10-CM

## 2022-02-09 DIAGNOSIS — Z1589 Genetic susceptibility to other disease: Secondary | ICD-10-CM

## 2022-02-09 HISTORY — DX: Gastro-esophageal reflux disease without esophagitis: K21.9

## 2022-02-09 MED ORDER — AZITHROMYCIN 500 MG PO TABS: 500.0000 mg | ORAL_TABLET | Freq: Every day | ORAL | 0 refills | Status: DC

## 2022-02-09 NOTE — Progress Notes (Signed)
Psychotherapy Progress Note [facility] [name], PhD LP  Patient ID: Richard Wilson [facility] [name])    MRN: [id number] Therapy format: Individual psychotherapy Date: 02/09/2022      Start: 9:16a     Stop: 10:03a     Time Spent: 47 min Location: In-person   Session narrative (presenting needs, interim history, self-report of stressors and symptoms, applications of prior therapy, status changes, and interventions made in session) Impulsively got online this weekend seeking a pet, looking to interview.  Found himself panicking about it, then ashamed of himself both for trying and for flaking out.  Tried to reframe and affirm that he actually caught a mistake before committing it, but hard for him to accept.  Encouraged try anyway.  Reviewed some history of shame -- he of course had it instantiated by culture (anti-gay) and by his father's notable anger, but also by his mother's example of thinking herself "not good enough" for the downtown church.  Discussed meanings to him and attempted to contrast with adult freedom to decide what does and does not have to be absolutely mortifying.  Urged continued permission to learn and to notice progress.  Did have the home sleep study, still awaiting results.  Says the equipment stayed on normally, figures there were valid results.  Affirmed and encouraged.  Looks to have PCP visit today.  Therapeutic modalities: Cognitive Behavioral Therapy, Solution-Oriented/Positive Psychology, and Ego-Supportive  Mental Status/Observations:  Appearance:   Casual     Behavior:  Appropriate  Motor:  Normal  Speech/Language:   Clear and Coherent  Affect:  Appropriate  Mood:  dysthymic  Thought process:  normal  Thought content:    WNL  Sensory/Perceptual disturbances:    WNL  Orientation:  Fully oriented  Attention:  Good    Concentration:  Fair  Memory:  WNL  Insight:    Fair  Judgment:   Good  Impulse Control:  Fair   Risk  Assessment: Danger to Self: No Self-injurious Behavior: No Danger to Others: No Physical Aggression / Violence: No Duty to Warn: No Access to Firearms a concern: No  Assessment of progress:  stabilized  Diagnosis:   ICD-10-CM   1. Major depressive disorder, recurrent episode, moderate (HCC)  F33.1     2. 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 Generalized anxiety disorder  F41.1     4. Social anxiety disorder  F40.10     5. Insomnia due to other mental disorder  F51.05    F99     6. Mild cognitive impairment of uncertain or unknown etiology -- suspect combination of nutritional deficiency, anxiety, sedative effects, and dyssomnia with possible apnea  G31.84     7. Variable compliance with medication and medical advice  Z91.148     8. Heterozygous MTHFR mutation C677T  Z15.89     9. Long-term current use of benzodiazepine  Z79.899     10. Suspected sleep apnea  R69      Plan:  Social connections and morale -- Still should emphasize finding friends over finding a lover.  Check with [place] group.  Free choice about church, just stay clear he may have to dig in a while to truly know what can be.  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  May inquire with new or old priest at discretion about connecting to others in churches, but de-emphasize dating.  If dating, don't have to use app, don't have  to agonize, just keep it low key, see what compatibilities and don't rush.especially given frightening sexual history and great inexperience.  Do not rush into getting cat -- assess costs and responsibility first.   Medication organization -- Keep meds organized, using a standard pillbox rather than rely on daily dispensing decisions and concentration. Nutrition and hydration -- Prefer methylated [id number] and folic acid based on MTHFR assessment, but understand cost.  Still recommend OTC B complex preferable to B12, and should see if superior doses achieve  benefit without methylated supply.  Maintain varied food sources, try to improve protein and vegetable content, restrain late carbs and sodium-laden juices.  Maintain adequate fluids, best use a measurable cup or jug to see, rather than trust intuition and sense of thirst, how much taken in during a day. Sleep disorder -- Maintain reasonably regular, timely bedtime, and electronics curfew.  Accept rest rather than sleep when not coming easy.  Curfew video, including porn watching.  Most likely finding of sleep apnea and recommendation for CPAP. 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  Absolutely must re-regulate dosing before trying to wean, and then try much more gradually, e.g., 1/4 mg at a time, starting with midday or morning, and grade out from there.  Seek psychiatry's advice and partnership on the schedule for weaning. Financial organization -- Continue banking/arithmetic measures, including use of calculator, legible handwriting, and willingness to fresh start with the bank's balance if he strays.  Should approach bank about his annuity and help budgeting. Regret/shame/impulsivity -- Reaffirm the good he tries to do, take heart in partial successes and good efforts, grant self permission to learn, and check drastic decisions before acting on them.  Notice if porn watching becomes a point of self-castigation and/or addiction. PCP relationship and medical organization -- Work with PCP in good faith to fully inform and ask necessary questions, fully comprehend medical advice, written instructions may help.  Care coordination with psychiatry and PCP ad lib.  Make sure to comprehend referrals and instructions rather than let uncomfortable feelings drive irrational conclusion-jumping.   Responsible party -- Consider reinstating Juliann Pulse as his next-of-kin, possibly POA. Other recommendations/advice as may be noted above Continue to utilize previously  learned skills ad lib Maintain medication as prescribed and work faithfully with relevant prescriber(s) if any changes are desired or seem indicated Call the clinic on-call service, 988/hotline, 911, or present to [facility] or ER if any life-threatening psychiatric crisis Return for as already scheduled. Already scheduled visit in this office 02/16/2022.  Blanchie Serve, PhD [name], PhD LP Clinical Psychologist, [facility] [address], [place], [place] [address] [phone]

## 2022-02-09 NOTE — Telephone Encounter (Signed)
Please let him know Dr. Redmond School and I spoke about his medications and that they might be affecting his oxygen level at night. I'd like patient to decrease the amitriptyline 25 mg to 1/2 pill p.o. nightly.  No other changes at this time.  He has an appointment with me next week and we can discuss in further detail then.  Please remind him to bring all of his medications that he takes whether they are prescribed, over-the-counter, including vitamins and supplements. Thanks.

## 2022-02-09 NOTE — Patient Instructions (Signed)
Gastroesophageal Reflux Disease, Adult  Gastroesophageal reflux (GER) happens when acid from the stomach flows up into the tube that connects the mouth and the stomach (esophagus). Normally, food travels down the esophagus and stays in the stomach to be digested. With GER, food and stomach acid sometimes move back up into the esophagus. You may have a disease called gastroesophageal reflux disease (GERD) if the reflux: Happens often. Causes frequent or very bad symptoms. Causes problems such as damage to the esophagus. When this happens, the esophagus becomes sore and swollen. Over time, GERD can make small holes (ulcers) in the lining of the esophagus. What are the causes? This condition is caused by a problem with the muscle between the esophagus and the stomach. When this muscle is weak or not normal, it does not close properly to keep food and acid from coming back up from the stomach. The muscle can be weak because of: Tobacco use. Pregnancy. Having a certain type of hernia (hiatal hernia). Alcohol use. Certain foods and drinks, such as coffee, chocolate, onions, and peppermint. What increases the risk? Being overweight. Having a disease that affects your connective tissue. Taking NSAIDs, such a ibuprofen. What are the signs or symptoms? Heartburn. Difficult or painful swallowing. The feeling of having a lump in the throat. A bitter taste in the mouth. Bad breath. Having a lot of saliva. Having an upset or bloated stomach. Burping. Chest pain. Different conditions can cause chest pain. Make sure you see your doctor if you have chest pain. Shortness of breath or wheezing. A long-term cough or a cough at night. Wearing away of the surface of teeth (tooth enamel). Weight loss. How is this treated? Making changes to your diet. Taking medicine. Having surgery. Treatment will depend on how bad your symptoms are. Follow these instructions at home: Eating and drinking  Follow a  diet as told by your doctor. You may need to avoid foods and drinks such as: Coffee and tea, with or without caffeine. Drinks that contain alcohol. Energy drinks and sports drinks. Bubbly (carbonated) drinks or sodas. Chocolate and cocoa. Peppermint and mint flavorings. Garlic and onions. Horseradish. Spicy and acidic foods. These include peppers, chili powder, curry powder, vinegar, hot sauces, and BBQ sauce. Citrus fruit juices and citrus fruits, such as oranges, lemons, and limes. Tomato-based foods. These include red sauce, chili, salsa, and pizza with red sauce. Fried and fatty foods. These include donuts, french fries, potato chips, and high-fat dressings. High-fat meats. These include hot dogs, rib eye steak, sausage, ham, and bacon. High-fat dairy items, such as whole milk, butter, and cream cheese. Eat small meals often. Avoid eating large meals. Avoid drinking large amounts of liquid with your meals. Avoid eating meals during the 2-3 hours before bedtime. Avoid lying down right after you eat. Do not exercise right after you eat. Lifestyle  Do not smoke or use any products that contain nicotine or tobacco. If you need help quitting, ask your doctor. Try to lower your stress. If you need help doing this, ask your doctor. If you are overweight, lose an amount of weight that is healthy for you. Ask your doctor about a safe weight loss goal. General instructions Pay attention to any changes in your symptoms. Take over-the-counter and prescription medicines only as told by your doctor. Do not take aspirin, ibuprofen, or other NSAIDs unless your doctor says it is okay. Wear loose clothes. Do not wear anything tight around your waist. Raise (elevate) the head of your bed about   6 inches (15 cm). You may need to use a wedge to do this. Avoid bending over if this makes your symptoms worse. Keep all follow-up visits. Contact a doctor if: You have new symptoms. You lose weight and you  do not know why. You have trouble swallowing or it hurts to swallow. You have wheezing or a cough that keeps happening. You have a hoarse voice. Your symptoms do not get better with treatment. Get help right away if: You have sudden pain in your arms, neck, jaw, teeth, or back. You suddenly feel sweaty, dizzy, or light-headed. You have chest pain or shortness of breath. You vomit and the vomit is green, yellow, or black, or it looks like blood or coffee grounds. You faint. Your poop (stool) is red, bloody, or black. You cannot swallow, drink, or eat. These symptoms may represent a serious problem that is an emergency. Do not wait to see if the symptoms will go away. Get medical help right away. Call your local emergency services (911 in the [place] Do not drive yourself to the hospital. Summary If a person has gastroesophageal reflux disease (GERD), food and stomach acid move back up into the esophagus and cause symptoms or problems such as damage to the esophagus. Treatment will depend on how bad your symptoms are. Follow a diet as told by your doctor. Take all medicines only as told by your doctor. This information is not intended to replace advice given to you by your health care provider. Make sure you discuss any questions you have with your health care provider. Document Revised: 07/30/2019 Document Reviewed: 07/30/2019 Elsevier Patient Education  [place] or Axid and you can double the dose on that daily for the next week or 2 and see if that will quiet it down if it does not then you can use Prilosec daily for a week or 2 if you need to do something on a daily basis and contact me and we can rediscuss at  Take the antibiotic for the next 3 days but it will last a week so given at least a week and if there is a question call We will set you up to get the CPAP and what we need to do then is to get a reading after you are stable on that for about a month so I can  look at your parameters make sure you are doing okay Check with Donnal Moat on possibly changing her medications around to help with your sleep and erection issues

## 2022-02-09 NOTE — Progress Notes (Signed)
   Subjective:    Patient ID: Richard Wilson, male    DOB: 10-Sep-1950, 72 y.o.   MRN: [id number]  HPI He is here to discuss multiple issues.  He has a 1 month history of a daily cough but no fever, chills, sore throat, earache.  Cough is dry in nature.  It can occur all day long and not just at night.  He also has noted a 32-monthhistory of difficulty with acid reflux.  He cannot relate this to any particular foods.  It can occur during the day as well as at night.  There is no associated difficulty with swallowing or feeling as if the food is getting stuck.  He also notes difficulty over the last several months with inability to achieve an erection.  He is concerned about his medications.  He also was recently rediagnosed with OSA and the results also indicated difficulty with nocturnal hypoxia.   Review of Systems     Objective:   Physical Exam Alert and in no distress. Tympanic membranes and canals are normal. Pharyngeal area is normal. Neck is supple without adenopathy or thyromegaly. Cardiac exam shows a regular sinus rhythm without murmurs or gallops. Lungs are clear to auscultation.        Assessment & Plan:  Bronchitis - Plan: azithromycin (ZITHROMAX) 500 MG tablet  Gastroesophageal reflux disease without esophagitis  Obstructive sleep apnea - Plan: For home use only DME continuous positive airway pressure (CPAP)  Erectile dysfunction, unspecified erectile dysfunction type This sounds like this could be an atypical bronchitis.  Explained that we might need to give another Dosepak of the Z-Pak after 1 week if still not totally better. Then discussed the GERD and gave information concerning this recommend H2 blocker and PPI if no better and if continued difficulty recheck. He will be set up for his CPAP and also overnight oximetry.  He seemed understand this. I then discussed the ED with him.  Explained that he could easily be the various psychotropic medications that he is on and  recommend he discuss this with psychiatry.  [facility] some [facility] around as a could be contributing to his sleep apnea.  Over 40 minutes spent discussing all these issues with him.

## 2022-02-09 NOTE — Telephone Encounter (Signed)
Notified patient and he repeated back to me.

## 2022-02-10 NOTE — Telephone Encounter (Signed)
noted 

## 2022-02-16 ENCOUNTER — Encounter: Payer: Self-pay | Admitting: Physician Assistant

## 2022-02-16 ENCOUNTER — Ambulatory Visit (INDEPENDENT_AMBULATORY_CARE_PROVIDER_SITE_OTHER): Payer: Medicare Other | Admitting: Physician Assistant

## 2022-02-16 DIAGNOSIS — G4733 Obstructive sleep apnea (adult) (pediatric): Secondary | ICD-10-CM

## 2022-02-16 DIAGNOSIS — F5105 Insomnia due to other mental disorder: Secondary | ICD-10-CM

## 2022-02-16 DIAGNOSIS — F3341 Major depressive disorder, recurrent, in partial remission: Secondary | ICD-10-CM

## 2022-02-16 DIAGNOSIS — Z91148 Patient's other noncompliance with medication regimen for other reason: Secondary | ICD-10-CM

## 2022-02-16 DIAGNOSIS — F411 Generalized anxiety disorder: Secondary | ICD-10-CM

## 2022-02-16 DIAGNOSIS — G3184 Mild cognitive impairment, so stated: Secondary | ICD-10-CM

## 2022-02-16 DIAGNOSIS — Z1589 Genetic susceptibility to other disease: Secondary | ICD-10-CM

## 2022-02-16 DIAGNOSIS — F99 Mental disorder, not otherwise specified: Secondary | ICD-10-CM

## 2022-02-16 MED ORDER — FLUVOXAMINE MALEATE 100 MG PO TABS: 200.0000 mg | ORAL_TABLET | Freq: Every day | ORAL | 2 refills | Status: DC

## 2022-02-16 MED ORDER — AMITRIPTYLINE HCL 25 MG PO TABS: 12.5000 mg | ORAL_TABLET | Freq: Every day | ORAL | 0 refills | Status: DC

## 2022-02-16 MED ORDER — CLONAZEPAM 1 MG PO TABS: ORAL_TABLET | ORAL | 1 refills | Status: DC

## 2022-02-16 NOTE — Progress Notes (Signed)
[facility]  Patient ID: Richard Wilson,  MRN: [id number]  PCP: Denita Lung, MD  Date of Evaluation: 02/16/2022 Tme spent:30 minutes  Chief Complaint:  Chief Complaint   Depression; Anxiety; Insomnia; Follow-up     HISTORY/CURRENT STATUS: HPI For routine med check.    Dr. Redmond School communicated with me concerning [name]'s sleep apnea with hypoxia. He'll be checking night pulse ox for further info and then treat him appropriately. Asked about decreasing or d/c any meds that may affect respirations. Pt notified last week to 1/2 Elavil but pt says he didn't know he was supposed to, even after he repeated instruction back to [name]. States he already 1/2 the mid day dose of Klonopin, as suggested by Dr. [name] several visits ago, and pt says it hasn't caused increased anxiety. Not having PA.   Has been taking Lamictal either 100 mg or 150 mg whichever bottle he picks up. Other meds are the same. He brought all meds to the visit for review.   Still doesn't enjoy much, energy/motivation are low. Reads for pleasure, still trying to figure out what church to go to.  No extreme sadness, tearfulness, or feelings of hopelessness.  ADLs and personal hygiene are normal.  Still has trouble remembering things but no worse.   Appetite has not changed.  Weight is stable. Still stays up too late. Trouble falling asleep and staying asleep.   Denies suicidal or homicidal thoughts.  Patient denies increased energy with decreased need for sleep, increased talkativeness, racing thoughts, impulsivity or risky behaviors, increased spending, increased libido, grandiosity, increased irritability or anger, paranoia, or hallucinations.  Denies dizziness, syncope, seizures, numbness, tremor, tics, unsteady gait, slurred speech, confusion.  Denies dystonia.  Individual Medical History/ Review of Systems: Changes? :Yes    see HPI and Dr. Lanice Shirts note  Past medications for mental health diagnoses  include: Rozerem, Trazodone caused vivid dreams, Xanax, Buspar, mirtazapine caused excessive somnolence and fatigue with flulike symptoms, Belsomra, propranolol for tremor, Zoloft, Abilify caused tremor, Klonopin, Effexor, Ativan, Wellbutrin, Nardil, gabapentin, Librium, Luvox, Amitriptyline   Pertinent info after reviewing Dr. Evelena Leyden notes, his previous psychiatrist in [facility]. Past suicide attempt in September 1996, by taking Tylenol.  He was admitted at that time to a psychiatric hospital in [facility].  Admitted to psych hospital multiple times in the past for "nervous breakdowns."  January 1985, February 1986, October 1991, September 1996, July 2000.  Allergies: Garamycin [gentamicin] and Penicillins  Current Medications:  Current Outpatient Medications:    B Complex Vitamins (VITAMIN B COMPLEX) TABS, Take 1 tablet by mouth daily., Disp: , Rfl:    Cholecalciferol (VITAMIN D3) 50 MCG (2000 UT) TABS, Take 1 tablet by mouth daily., Disp: 90 tablet, Rfl: 0   folic acid (FOLVITE) 1 MG tablet, TAKE 1 TABLET BY MOUTH EVERY DAY, Disp: 90 tablet, Rfl: 1   hydrocortisone 2.5 % cream, Apply 1 Application topically daily., Disp: , Rfl:    lithium carbonate 150 MG capsule, TAKE 1 CAPSULE (150 MG TOTAL) BY MOUTH 2 (TWO) TIMES DAILY WITH A MEAL., Disp: 60 capsule, Rfl: 0   thiamine (VITAMIN B1) 100 MG tablet, Take 1 tablet (100 mg total) by mouth daily., Disp: 30 tablet, Rfl: 1   vitamin B-12 (CYANOCOBALAMIN) 1000 MCG tablet, Take 1,000 mcg by mouth daily., Disp: , Rfl:    amitriptyline (ELAVIL) 25 MG tablet, Take 0.5 tablets (12.5 mg total) by mouth at bedtime., Disp: 90 tablet, Rfl: 0   clonazePAM (KLONOPIN)  1 MG tablet, 1 by mouth every morning, 1/2 pill in mid-day, 1/2 pill at bedtime., Disp: 90 tablet, Rfl: 1   fluvoxaMINE (LUVOX) 100 MG tablet, Take 2 tablets (200 mg total) by mouth at bedtime., Disp: 60 tablet, Rfl: 2   lamoTRIgine (LAMICTAL) 150 MG tablet,  TAKE 1 TABLET BY MOUTH EVERY DAY, Disp: 90 tablet, Rfl: 0 Medication Side Effects: sexual dysfunction   Family Medical/ Social History: Changes?  no  MENTAL HEALTH EXAM:  There were no vitals taken for this visit.There is no height or weight on file to calculate BMI.  General Appearance: Casual and Well Groomed  Eye Contact:  Good  Speech:  Clear and Coherent, Normal Rate, and Talkative  Volume:  Normal  Mood:  Euthymic and calm  Affect:  Congruent  Thought Process:  Goal Directed and Descriptions of Associations: Circumstantial  Orientation:  Full (Time, Place, and Person)  Thought Content: Logical   Suicidal Thoughts:  No  Homicidal Thoughts:  No  Memory:  WNL  Judgement:  Good  Insight:  Good  Psychomotor Activity:  Normal and no tremor  Concentration:  Concentration: Fair and Attention Span: Good  Recall:  Good  Fund of Knowledge: Good  Language: Good  Assets:  Desire for Improvement Financial Resources/Insurance Housing Transportation  ADL's:  Intact  Cognition: WNL  Prognosis:  Good   Gene site test results are on chart under media. See neuro psych test results on chart.[name] [name] 01/21/2020  Note from neuro, Sharene Butters, PA-C from 07/21/2021.   DIAGNOSES:    ICD-10-CM   1. Generalized anxiety disorder  F41.1     2. Obstructive sleep apnea  G47.33     3. Insomnia due to other mental disorder  F51.05    F99     4. Recurrent major depression in partial remission ([place])  F33.41     5. Mild cognitive impairment of uncertain or unknown etiology -- suspect combination of nutritional deficiency, anxiety, sedative effects, and dyssomnia with possible apnea  G31.84     6. Heterozygous MTHFR mutation C677T  Z15.89     7. Variable compliance with medication and medical advice  Z91.148       Receiving Psychotherapy: Yes    Dr. Jonni Sanger Mitchum.   RECOMMENDATIONS:  PDMP was reviewed.  Klonopin last filled 01/26/2022. I provided 30 minutes of face to face time  during this encounter, including time spent before and after the visit in records review, medical decision making, counseling pertinent to today's visit, and charting.   I looked at all Rx bottles and pulled out the 2 bottles of Lamictal 100 mg, left him with the 150 mg. Bottles of 100 mg given to OM to dispose of.  Pt wrote down the following instructions and they are listed on AVS: Decrease the Amitriptyline 25 mg to 1/2 pill at bedtime. Decrease Clonazepam 1 mg to 1 pill every morning, 1/2 pill mid-day, and 1/2 pill at bedtime. On the Fluvoxamine 100 mg, take 2 pills at bedtime, instead of 1 twice a day. Continue all other medications as directed.   He states he didn't know he was supposed to f/u with Dr. Redmond School and I've encouraged him to contact his office for further instruction concerning OSA/nocturnal hypoxia.   [name]'s meds are difficult to manage b/c he still gets confused easily, even though he writes down new instructions and/or on AVS. He keeps old med bottles and will take wrong doses even though there's been a change.  I'll continue to work with him on decreasing Elavil and Klonopin, hopefully to get off Elavil and get Klonopin as low a dose possible.  I seriously doubt we will ever be able to stop the Klonopin.  He has been on it for several decades.  We attempted a decrease in dose last year by just 0.125 mg, he did not tolerate it well, became histrionic but today he is amenable to another decrease in dose.  Hopefully it works out well, even if we can decrease the bedtime dose that will help with the nocturnal hypoxia.  I discussed with him some of the confusion and memory issues is likely due to sleep apnea and hypoxia so again encouraged him to follow-up with Dr. Redmond School. Dr. [name] also helps [name] understand his medications and doses, which I appreciate very much.  Decrease Elavil 25 mg to 1/2 pill nightly. Decrease Klonopin 1 mg by continuing 1 p.o. every morning, 1/2 pill  midday, and decrease the evening dose to 1/2 pill. Continue Luvox 100 mg but take both pills at bedtime, not one twice daily as that could be causing confusion and daytime drowsiness. Take Lamictal 150 mg daily. Continue lithium 150 mg, 1 p.o. twice daily. Continue multivitamin, B complex, folic acid, thiamine, and B12. Continue therapy with Dr. [name]. Return in 4 weeks.  Donnal Moat, PA-C

## 2022-02-16 NOTE — Patient Instructions (Addendum)
Decrease the Amitriptyline 25 mg to 1/2 pill at bedtime. Decrease Clonazepam 1 mg to 1 pill every morning, 1/2 pill mid-day, and 1/2 pill at bedtime. On the Fluvoxamine 100 mg, take 2 pills at bedtime, instead of 1 twice a day. Continue all other medications as directed.   It's very important that you get in touch with Dr. Lanice Shirts office to follow up on the sleep test results.

## 2022-02-17 ENCOUNTER — Ambulatory Visit (INDEPENDENT_AMBULATORY_CARE_PROVIDER_SITE_OTHER): Payer: Medicare Other | Admitting: Psychiatry

## 2022-02-17 DIAGNOSIS — Z79899 Other long term (current) drug therapy: Secondary | ICD-10-CM

## 2022-02-17 DIAGNOSIS — G4733 Obstructive sleep apnea (adult) (pediatric): Secondary | ICD-10-CM

## 2022-02-17 DIAGNOSIS — F5105 Insomnia due to other mental disorder: Secondary | ICD-10-CM

## 2022-02-17 DIAGNOSIS — F341 Dysthymic disorder: Secondary | ICD-10-CM | POA: Diagnosis not present

## 2022-02-17 DIAGNOSIS — F331 Major depressive disorder, recurrent, moderate: Secondary | ICD-10-CM

## 2022-02-17 DIAGNOSIS — F411 Generalized anxiety disorder: Secondary | ICD-10-CM

## 2022-02-17 DIAGNOSIS — Z91148 Patient's other noncompliance with medication regimen for other reason: Secondary | ICD-10-CM

## 2022-02-17 DIAGNOSIS — F4010 Social phobia, unspecified: Secondary | ICD-10-CM

## 2022-02-17 DIAGNOSIS — F99 Mental disorder, not otherwise specified: Secondary | ICD-10-CM

## 2022-02-17 DIAGNOSIS — Z1589 Genetic susceptibility to other disease: Secondary | ICD-10-CM

## 2022-02-17 DIAGNOSIS — G3184 Mild cognitive impairment, so stated: Secondary | ICD-10-CM

## 2022-02-17 NOTE — Progress Notes (Signed)
Psychotherapy Progress Note [facility] [name], PhD LP  Patient ID: Furious Writer [facility] [name])    MRN: [id number] Therapy format: Individual psychotherapy Date: 02/17/2022      Start: 6:12p     Stop: 7:02p     Time Spent: 50 min Location: In-person   Session narrative (presenting needs, interim history, self-report of stressors and symptoms, applications of prior therapy, status changes, and interventions made in session) Seen again at late session, more sedate.  Was informed he does have sleep apnea, and he has an out of pocket cost of about $250 to get started with CPAP.  Is willing and interested, working up.  Professes not to know how he will be set up for CPAP, but he has a name -- [employer], or something like it -- to email, or something.  Assured his PCP office most likely will be in touch if not already, and that he will be getting a call from the DME company.  Sounds like it is set up to do a home visit to set up and orient.  Says he needs an updated eye Rx, been 4-5 years since last optometry.  Encouraged to get to it when able.  Apparently not in dire need of better lenses, but update would surely help overall comfort.  Says he is embarking now on his 2nd dose reduction of Klonopin, plan being 1/2 pill morning and afternoon, whole for sleep.  (Check of EHR later shows he has this backwards -- 1 QAM, 1/2 each midday and QHS.)  Re. 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  Endorsed his choice, again encouraging he try to settle for more than a few weeks so he can stand a chance to get to know people.  And he has discovered the [employer] on [address], where he can, for a fee, visit cats.  Tempted again to purchase impulsively, but caught himself and held off.  Has gone ahead and  bought a Armed forces operational officer and litter, actually, but not jumping into ownership yet.  One offered there would be many hundreds.  Affirmed his self-control and judgment, and encouraged further excursions either to the [facility] or to [employer] [facility] (at no charge) for no-commitment interactions and checking his sensibilities.  Momentarily faults himself right now for not snatching up a stray cat who seemed to offer itself a few weeks back, but again reassured it was not a sensible time, it is not at all clear a stray would have been content, it may come back, and if not there will still be multiple "right" choices for a pet -- don't leap into the tragic idea of one right match, forever and stupidly missed.  Unexpectedly offers he had the thought yesterday that he wishes [place] had been his actual father, that he would have been much better prepared for life and its anxieties.  Flattered, without encouraging transference.  Felipa Evener out stories of his parents, how he was received when asking inconvenient personal questions, and learning well of his father's failings and his mother's regret marrying him, albeit dressed up in the dignity she kept about doing her saintly duty.  Back story on how both of his parents were aging singles in their small town, who essentially settled for each other on finding no other choices.  Support/empathy provided.  Therapeutic modalities: Cognitive Behavioral Therapy, Solution-Oriented/Positive Psychology, and Ego-Supportive  Mental Status/Observations:  Appearance:   Casual     Behavior:  Appropriate  Motor:  Normal  Speech/Language:   Clear and Coherent  Affect:  Appropriate  Mood:  dysthymic  Thought process:  normal  Thought content:    Worry, reminiscences  Sensory/Perceptual disturbances:    WNL  Orientation:  Fully oriented  Attention:  Good    Concentration:  Good  Memory:  WNL  Insight:    Good  Judgment:   Fair  Impulse Control:  Fair   Risk Assessment: Danger to  Self: No Self-injurious Behavior: No Danger to Others: No Physical Aggression / Violence: No Duty to Warn: No Access to Firearms a concern: No  Assessment of progress:  progressing  Diagnosis:   ICD-10-CM   1. Major depressive disorder, recurrent episode, moderate (HCC)  F33.1     2. 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 Generalized anxiety disorder  F41.1     4. Social anxiety disorder  F40.10     5. Obstructive sleep apnea  G47.33     6. Insomnia due to other mental disorder  F51.05    F99     7. Variable compliance with medication and medical advice  Z91.148     8. Mild cognitive impairment of uncertain or unknown etiology -- suspect combination of nutritional deficiency, anxiety, sedative effects, and dyssomnia with possible apnea  G31.84     9. Heterozygous MTHFR mutation C677T  Z15.89     10. Long-term current use of benzodiazepine  Z79.899      Plan:  Social connections and morale -- Still should emphasize finding friends over finding a lover.  Check with [place] group.  Free choice about church, just stay clear he may have to dig in a while to truly know what can be available in community and friends, not just priests, or possible single gay men.  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  May inquire with new or old priest at discretion about connecting to others at [employer], but de-emphasize dating.  If dating, don't have to use app, don't have to agonize, just keep it low key, see what compatibilities they have, and don't rush to any expectations of love or lust, especially given frightening sexual history and great inexperience.  Porn, while free, is not a substitute for real-life connections, which are available.  Endorse getting a cat, but only if it is clear what the costs and responsibilities are, and whether he can meet them.  In the meantime, zero-obligation encounters like [facility] or [facility]. Medication organization and sedative dependence -- Keep meds organized, using a standard pillbox rather than rely on daily dispensing decisions and concentration.  Make sure to reconcile Klonopin dose schedule with medical advice, promptly and regularly. Nutrition and hydration -- Prefer methylated 123456 and folic acid based on MTHFR assessment, but understand cost.  Still recommend OTC B complex preferable to B12, and should see if superior doses achieve benefit without methylated supply.  Maintain varied food sources, try to improve protein and vegetable content, restrain late carbs and sodium-laden juices.  Maintain adequate fluids, best use a measurable cup or jug to see, rather than trust intuition and sense of thirst, how much taken in during a day. Sleep disorder -- Maintain reasonably regular, timely bedtime, and electronics curfew.  Accept rest rather than sleep when  it's not coming easy.  Curfew video, including porn watching.  Trust in [place] to get in touch about setting up and orienting for CPAP, but may inquire with PCP office if need refresher on what to expect. Financial organization -- Continue banking/arithmetic measures, including use of calculator, legible handwriting, and willingness to fresh start with the bank's balance if he strays.  Should approach bank about his annuity and help budgeting. Regret/shame/impulsivity -- Reaffirm the good he tries to do, take heart in partial successes and good efforts, grant self permission to learn, and check drastic decisions before acting on them.  Notice if porn watching becomes a point of self-castigation and/or addiction. PCP relationship and medical organization -- Work with PCP in good faith to fully inform and ask necessary questions, fully comprehend medical advice.  Written instructions may help.  If unclear, ask, don't assume.  Make sure to comprehend referrals and instructions rather than let uncomfortable feelings drive irrational  conclusion-jumping.  Care coordination with psychiatry and PCP ad lib. Responsible party -- Consider reinstating Juliann Pulse as his next-of-kin, possibly POA. Other recommendations/advice as may be noted above Continue to utilize previously learned skills ad lib Maintain medication as prescribed and work faithfully with relevant prescriber(s) if any changes are desired or seem indicated Call the clinic on-call service, 988/hotline, 911, or present to [facility] or ER if any life-threatening psychiatric crisis Return in about 1 week (around 02/24/2022). Already scheduled visit in this office 02/24/2022.  Blanchie Serve, PhD [name], PhD LP Clinical Psychologist, [facility] [address], [place], [place] [address] [phone]

## 2022-02-22 ENCOUNTER — Other Ambulatory Visit: Payer: Self-pay | Admitting: Physician Assistant

## 2022-02-24 ENCOUNTER — Ambulatory Visit (INDEPENDENT_AMBULATORY_CARE_PROVIDER_SITE_OTHER): Payer: Medicare Other | Admitting: Psychiatry

## 2022-02-24 DIAGNOSIS — F4010 Social phobia, unspecified: Secondary | ICD-10-CM

## 2022-02-24 DIAGNOSIS — F331 Major depressive disorder, recurrent, moderate: Secondary | ICD-10-CM

## 2022-02-24 DIAGNOSIS — G3184 Mild cognitive impairment, so stated: Secondary | ICD-10-CM

## 2022-02-24 DIAGNOSIS — F5105 Insomnia due to other mental disorder: Secondary | ICD-10-CM

## 2022-02-24 DIAGNOSIS — Z1589 Genetic susceptibility to other disease: Secondary | ICD-10-CM

## 2022-02-24 DIAGNOSIS — F99 Mental disorder, not otherwise specified: Secondary | ICD-10-CM

## 2022-02-24 DIAGNOSIS — Z91148 Patient's other noncompliance with medication regimen for other reason: Secondary | ICD-10-CM

## 2022-02-24 DIAGNOSIS — M79604 Pain in right leg: Secondary | ICD-10-CM

## 2022-02-24 DIAGNOSIS — G4733 Obstructive sleep apnea (adult) (pediatric): Secondary | ICD-10-CM

## 2022-02-24 DIAGNOSIS — F341 Dysthymic disorder: Secondary | ICD-10-CM

## 2022-02-24 DIAGNOSIS — F411 Generalized anxiety disorder: Secondary | ICD-10-CM

## 2022-02-24 DIAGNOSIS — Z79899 Other long term (current) drug therapy: Secondary | ICD-10-CM

## 2022-02-24 NOTE — Progress Notes (Signed)
Psychotherapy Progress Note [facility] [name], PhD LP  Patient ID: Avian Gaster [facility] [name])    MRN: [id number] Therapy format: Individual psychotherapy Date: 02/24/2022      Start: 11:11a     Stop: 12:01p     Time Spent: 50 min Location: In-person   Session narrative (presenting needs, interim history, self-report of stressors and symptoms, applications of prior therapy, status changes, and interventions made in session) Allowed himself an over long nap yesterday, which spoiled his sleep last night, which irritated him and made him anxious, then sparked thoughts of going to a mental hospital.  Been up since 1am at this point, but did manage to occupy himself last night with online window shopping.  Admittedly does continue to buy and sell silver from time to time, as a form of self-soothing/self-indulgence.  Normalized sleep disruption, assured his circadian rhythm will kick back in once he follows through with today's time line, however exhausting it may be, and he will without doubt sleep better tonight as long as he doesn't take a long nap today.  Affirmed efforts to work with his impulse, bargain down the spending, and reduce harm.  It did work to calm down, as did some time thinking about the logistics of handling mail, etc. with noone to turn to should he go to the hospital.  Not suicidal, in fact, more just so disturbed by perceived failure to manage.  It alls started earlier, with a pretty industrious Saturday, with errands and cleaning up, then on Sunday begging off church, because Saturday sleep had been so badly spoiled by right leg pain, which it turns out has been acting up for years intermittently.  (Thought to be a bone spur, but much more likely sciatica.)  Then on his own case again about not going to church, and what his true motivation is, and various perceived failures.  Support/empathy provided on all fronts, assured none of it need be so awful as  all that, still just problems to work.  Says Feb 5 now is when scheduled to have a home visit from CPAP provider.  Briefly oriented what to expect and coached up positive expectations.  Still feels unclear about it, encouraged to   Still interested in getting a cat, again saying he "should have" taken in the stray.  Again told he can't be so sure it would have worked, and it is still worth better knowing what his costs and responsibilities are.  Meanwhile, renewed calls to dig into one church and make contact with social support agency of one kind or another.  Therapeutic modalities: Cognitive Behavioral Therapy, Solution-Oriented/Positive Psychology, and Ego-Supportive  Mental Status/Observations:  Appearance:   Casual     Behavior:  Appropriate and complaining  Motor:  Normal  Speech/Language:   Clear and Coherent  Affect:  Appropriate  Mood:  dysthymic  Thought process:  normal  Thought content:    Rumination  Sensory/Perceptual disturbances:    WNL  Orientation:  Fully oriented  Attention:  Good    Concentration:  Good  Memory:  grossly intact, some forgetfulness from episode to episode  Insight:    Fair  Judgment:   Fair  Impulse Control:  Fair   Risk Assessment: Danger to Self: No Self-injurious Behavior: No Danger to Others: No Physical Aggression / Violence: No Duty to Warn: No Access to Firearms a concern: No  Assessment of progress:  situational setback(s)  Diagnosis:   ICD-10-CM   1. Major depressive disorder, recurrent  episode, moderate (HCC)  F33.1     2. 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 Generalized anxiety disorder  F41.1     4. Social anxiety disorder  F40.10     5. Obstructive sleep apnea  G47.33     6. Insomnia due to other mental disorder  F51.05    F99     7. Variable compliance with medication and medical advice  Z91.148     8. Mild cognitive impairment of uncertain or unknown etiology -- suspect combination of nutritional deficiency,  anxiety, sedative effects, and dyssomnia with possible apnea  G31.84     9. Heterozygous MTHFR mutation C677T  Z15.89     10. Long-term current use of benzodiazepine  Z79.899     11. Right leg pain (susp. sciatica, chronic, exacerbated by housework)  M79.604      Plan:  Social connections and morale -- Still should emphasize finding friends over finding a lover.  Check with [employer] [employer] [facility].  Free choice about church, just stay clear he may have to dig in a while to truly know what can be available in community and friends, not just priests, or possible single gay men.  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  May inquire with new or old priest at discretion about connecting to others at [employer], but de-emphasize dating.  If dating, don't have to use app, don't have to agonize, just keep it low key, see what compatibilities they have, and don't rush to any expectations of love or lust, especially given frightening sexual history and great inexperience.  Endorse getting a cat, but only if it is clear what the costs and responsibilities are, and whether he can meet them.  In the meantime, zero-obligation encounters like [facility] or [facility]. Medication organization and sedative dependence -- Keep meds organized, using a standard pillbox rather than rely on daily dispensing decisions and concentration.  Make sure to reconcile Klonopin dose schedule with medical advice, promptly and regularly. Nutrition and hydration -- Prefer methylated 123456 and folic acid based on MTHFR assessment, but understand cost.  Still recommend OTC B complex preferable to B12, and should see if superior doses achieve benefit without methylated supply.  Maintain varied food sources, try to improve protein and vegetable content, restrain late carbs and sodium-laden juices.  Maintain adequate fluids, best use a measurable cup or jug to see, rather than trust  intuition and sense of thirst, how much taken in during a day. Sleep disorder -- Maintain reasonably regular, timely bedtime, and electronics curfew.  Accept rest rather than sleep when not coming easy.  Curfew video, including porn watching.  Trust in [place] to get in touch about setting up and orienting for CPAP, but may inquire with PCP office if need refresher on what to expect. Financial organization -- Continue banking/arithmetic measures, including use of calculator, legible handwriting, and willingness to fresh start with the bank's balance if he strays.  Should approach bank about his annuity and help budgeting. Regret/shame/impulsivity -- Reaffirm the good he tries to do, take heart in partial successes and good efforts, grant self permission to learn, and check drastic decisions before acting on them.  Notice if porn watching becomes a point of self-castigation and/or addiction. PCP relationship and medical organization -- Work with PCP in good faith to fully inform and ask necessary questions, fully comprehend medical advice.  Written instructions may help.  If  unclear, ask, don't assume.  Make sure to comprehend referrals and instructions rather than let uncomfortable feelings drive irrational conclusion-jumping.  Address pain and sleep issues as noted, be willing to share suspicions here.  Care coordination with psychiatry and PCP ad lib. Responsible party -- Consider reinstating Juliann Pulse as his next-of-kin, possibly POA. Other recommendations/advice as may be noted above Continue to utilize previously learned skills ad lib Maintain medication as prescribed and work faithfully with relevant prescriber(s) if any changes are desired or seem indicated Call the clinic on-call service, 988/hotline, 911, or present to [facility] or ER if any life-threatening psychiatric crisis Return for as already scheduled. Already scheduled visit in this office 03/03/2022.  Blanchie Serve, PhD [name], PhD  LP Clinical Psychologist, [facility] [address], [place], [place] [address] [phone]

## 2022-03-01 ENCOUNTER — Other Ambulatory Visit: Payer: Self-pay | Admitting: Physician Assistant

## 2022-03-03 ENCOUNTER — Ambulatory Visit (INDEPENDENT_AMBULATORY_CARE_PROVIDER_SITE_OTHER): Payer: Medicare Other | Admitting: Psychiatry

## 2022-03-03 DIAGNOSIS — Z1589 Genetic susceptibility to other disease: Secondary | ICD-10-CM

## 2022-03-03 DIAGNOSIS — F5105 Insomnia due to other mental disorder: Secondary | ICD-10-CM

## 2022-03-03 DIAGNOSIS — F341 Dysthymic disorder: Secondary | ICD-10-CM | POA: Diagnosis not present

## 2022-03-03 DIAGNOSIS — Z79899 Other long term (current) drug therapy: Secondary | ICD-10-CM

## 2022-03-03 DIAGNOSIS — F4010 Social phobia, unspecified: Secondary | ICD-10-CM

## 2022-03-03 DIAGNOSIS — F331 Major depressive disorder, recurrent, moderate: Secondary | ICD-10-CM | POA: Diagnosis not present

## 2022-03-03 DIAGNOSIS — F411 Generalized anxiety disorder: Secondary | ICD-10-CM | POA: Diagnosis not present

## 2022-03-03 DIAGNOSIS — F99 Mental disorder, not otherwise specified: Secondary | ICD-10-CM

## 2022-03-03 DIAGNOSIS — Z91148 Patient's other noncompliance with medication regimen for other reason: Secondary | ICD-10-CM

## 2022-03-03 DIAGNOSIS — G4733 Obstructive sleep apnea (adult) (pediatric): Secondary | ICD-10-CM

## 2022-03-03 DIAGNOSIS — G3184 Mild cognitive impairment, so stated: Secondary | ICD-10-CM

## 2022-03-03 NOTE — Progress Notes (Signed)
Psychotherapy Progress Note [facility] [name], PhD LP  Patient ID: Richard Wilson [facility] [name])    MRN: [id number] Therapy format: Individual psychotherapy Date: 03/03/2022      Start: 9:23a     Stop: 10:13a     Time Spent: 50 min Location: In-person   Session narrative (presenting needs, interim history, self-report of stressors and symptoms, applications of prior therapy, status changes, and interventions made in session) Had something of a meltdown yesterday.  Got frustrated with himself not being able to do or cope with a variety of unnamed things.  Impulsive decision to take himself to ED in [place] last night, then left after 4 hrs.  Not suicidal, more felt he could get some support going to where a church friend works.  Pissed at not getting served earlier, and pissed at his tax preparer for asking him questions he couldn't answer.  Pissed at himself for Sunday making the mistake of trying to chat up a man 57 years younger at church.  Shaming himself ever since, though it sounds like the man was actually gracious about it.  Gently chided for thinking approaching a stranger at church would work as a pickup and for more broadly demanding so much of himself and his situation.  Turns out home environment revealed to be more of a mess than previously known, with stacks of boxes.  Encouraged generally in taking humane "bites" out of his tasks and letting it be a job that takes more than one good rush to get through.  Also turns out he has been starting the day with low-dose Klonopin (1/2), dosing high midday (whole), then 1/2 again at bedtime, but dose times can vary by hours midday and bedtime.  Seems to be setting himself up for too much anxiety and/or withdrawal experiences, contributing to loss of patience, maybe loss of sleep, certainly loss of perceived stability, ad then napping, impulsive actions, and monumental self-judgment.  Redressed Klonopin dosing plan, advised  to go ahead today and take 2nd half when he gets home, wait 6 hrs to take another half, then dose evening at a comfortable time before bed.    Adds that he has put away most of the many pictures of his mother he had out.  Endorsed as sensible and helpful, since pining for her has been a feature of his depression and perhaps a frequent trigger to dwell on tragic narratives of her life and his own.    Therapeutic modalities: Cognitive Behavioral Therapy, Solution-Oriented/Positive Psychology, and Ego-Supportive  Mental Status/Observations:  Appearance:   Casual     Behavior:  Appropriate  Motor:  Normal  Speech/Language:   Clear and Coherent  Affect:  Appropriate  Mood:  dysthymic  Thought process:  normal  Thought content:    Rumination  Sensory/Perceptual disturbances:    WNL  Orientation:  Fully oriented  Attention:  Good    Concentration:  Fair  Memory:  WNL  Insight:    Fair  Judgment:   Fair  Impulse Control:  Fair   Risk Assessment: Danger to Self: No Self-injurious Behavior: No Danger to Others: No Physical Aggression / Violence: No Duty to Warn: No Access to Firearms a concern: No  Assessment of progress:  stabilized  Diagnosis:   ICD-10-CM   1. Major depressive disorder, recurrent episode, moderate (HCC)  F33.1     2. 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 Generalized anxiety disorder  F41.1     4. Social  anxiety disorder  F40.10     5. Long-term current use of benzodiazepine  Z79.899     6. Variable compliance with medication and medical advice  Z91.148     7. Insomnia due to other mental disorder  F51.05    F99     8. Mild cognitive impairment of uncertain or unknown etiology -- suspect combination of nutritional deficiency, anxiety, sedative effects, and dyssomnia with possible apnea  G31.84     9. Obstructive sleep apnea  G47.33     10. Heterozygous MTHFR mutation C677T  Z15.89      Plan:  Social connections and morale -- Still should emphasize  finding friends over finding a lover.  Check with [employer] [employer] [facility].  Free choice about church, just stay clear he may have to dig in a while to truly know what can be available in community and friends, not just priests, or possible single gay men.  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  May inquire with new or old priest at discretion about connecting to others at [employer], but de-emphasize dating.  If dating, don't have to use app, don't have to agonize, just keep it low key, see what compatibilities they have, and don't rush to any expectations of love or lust, especially given frightening sexual history and great inexperience.  Endorse getting a cat, but only if it is clear what the costs and responsibilities are, and whether he can meet them.  In the meantime, zero-obligation encounters like [employer] or [employer]. Medication organization and sedative dependence -- Keep meds organized, using a standard pillbox rather than rely on daily dispensing decisions and concentration.  Make sure to reconcile Klonopin dose schedule with medical advice, promptly and regularly. Nutrition and hydration -- Prefer methylated 123456 and folic acid based on MTHFR assessment, but understand cost.  Still recommend OTC B complex preferable to B12, and should see if superior doses achieve benefit without methylated supply.  Maintain varied food sources, try to improve protein and vegetable content, restrain late carbs and sodium-laden juices.  Maintain adequate fluids, best use a measurable cup or jug to see, rather than trust intuition and sense of thirst, how much taken in during a day. Sleep disorder -- Maintain reasonably regular, timely bedtime, and electronics curfew.  Accept rest rather than sleep when not coming easy.  Curfew video, including porn watching, and limit daytime naps to help maintain circadian rhythm.  Trust in [place] to get in touch  about setting up and orienting for CPAP, but may inquire with PCP office if need refresher on what to expect.   Financial organization -- Continue banking/arithmetic measures, including use of calculator, legible handwriting, and willingness to fresh start with the bank's balance if he strays.  Should approach bank about his annuity and help budgeting. Regret/shame/impulsivity -- Reaffirm the good he tries to do, take heart in partial successes and good efforts, grant self permission to learn, and check drastic decisions before acting on them.  Notice if porn watching becomes a point of self-castigation and/or addiction. PCP relationship and medical organization -- Work with PCP in good faith to fully inform and ask necessary questions, fully comprehend medical advice.  Written instructions may help.  If unclear, ask, don't assume.  Make sure to comprehend referrals and instructions rather than let uncomfortable feelings drive irrational conclusion-jumping.  Address pain and sleep issues as noted, be willing to share suspicions here.  Care  coordination with psychiatry and PCP ad lib. Responsible party -- Consider reinstating Juliann Pulse as his next-of-kin, possibly POA. Other recommendations/advice as may be noted above Continue to utilize previously learned skills ad lib Maintain medication as prescribed and work faithfully with relevant prescriber(s) if any changes are desired or seem indicated Call the clinic on-call service, 988/hotline, 911, or present to [facility] or ER if any life-threatening psychiatric crisis Return in about 1 week (around 03/10/2022) for as already scheduled. Already scheduled visit in this office 03/11/2022.  Blanchie Serve, PhD [name], PhD LP Clinical Psychologist, [facility] [address] [place], [place] [address] [phone]

## 2022-03-06 ENCOUNTER — Other Ambulatory Visit: Payer: Self-pay | Admitting: Physician Assistant

## 2022-03-10 ENCOUNTER — Ambulatory Visit: Payer: Medicare Other | Admitting: Psychiatry

## 2022-03-11 ENCOUNTER — Ambulatory Visit (INDEPENDENT_AMBULATORY_CARE_PROVIDER_SITE_OTHER): Payer: Medicare Other | Admitting: Psychiatry

## 2022-03-11 DIAGNOSIS — G3184 Mild cognitive impairment, so stated: Secondary | ICD-10-CM

## 2022-03-11 DIAGNOSIS — Z79899 Other long term (current) drug therapy: Secondary | ICD-10-CM

## 2022-03-11 DIAGNOSIS — F99 Mental disorder, not otherwise specified: Secondary | ICD-10-CM

## 2022-03-11 DIAGNOSIS — F411 Generalized anxiety disorder: Secondary | ICD-10-CM

## 2022-03-11 DIAGNOSIS — G4733 Obstructive sleep apnea (adult) (pediatric): Secondary | ICD-10-CM

## 2022-03-11 DIAGNOSIS — Z1589 Genetic susceptibility to other disease: Secondary | ICD-10-CM

## 2022-03-11 DIAGNOSIS — F341 Dysthymic disorder: Secondary | ICD-10-CM

## 2022-03-11 DIAGNOSIS — F331 Major depressive disorder, recurrent, moderate: Secondary | ICD-10-CM

## 2022-03-11 DIAGNOSIS — F5105 Insomnia due to other mental disorder: Secondary | ICD-10-CM

## 2022-03-11 DIAGNOSIS — Z91148 Patient's other noncompliance with medication regimen for other reason: Secondary | ICD-10-CM

## 2022-03-11 DIAGNOSIS — F4010 Social phobia, unspecified: Secondary | ICD-10-CM

## 2022-03-11 NOTE — Progress Notes (Signed)
Psychotherapy Progress Note [facility] [name], PhD LP  Patient ID: Richard Wilson [facility] [name])    MRN: [id number] Therapy format: Individual psychotherapy Date: 03/11/2022      Start: 9:15a     Stop: 10:15a     Time Spent: 60 min Location: In-person   Session narrative (presenting needs, interim history, self-report of stressors and symptoms, applications of prior therapy, status changes, and interventions made in session) Impulsively wrote to [facility] on Thurs last week -- 1 day after seen, and calmed, 2 days after desperately presenting to ED in [facility] and leaving -- about feeling out of his depth and despairing in his life.  Asked in session to bring his concerns to the office, not count on ambiguous letters to communicate or make decisions.  Acknowledges he made another impulsive decision, trying to take in the stray cat he had interacted with several weeks back.  Scratches evident on his arms, says he spent down the last of his money and started spending on credit card to afford the necessaries.  By this weekend, the cat was uncomfortable and bolted through an open door.  Fortuitous, as it put an end to compulsive spending and racking himself over whether he should be able to pull off pet ownership.  Now deciding he should not even try to keep a pet, he'll just fail at that, too.  Support/empathy provided, and addressed escalating pattern of desperate behavior.    To the positive, he has achieved good bookkeeping, since several months ago, adopting a calculator and being sure to make his figures legible.  Alludes to having savings of some kind that he might still tap in need to settle credit card debt.  Says his ER trip 9 days ago was just for companionship, because he knows someone who works there.  Has ophthalmologist visit today to end a 4-year drought in eye care.  Knows his vision has been degrading over time, and it's time to update his Rx.  Acknowledges CPAP  started on Monday.  The consultation went very well setting up, and it is plenty comfortable so far, feels barely noticeable when lying down for sleep, and beginning to rest better already.  At the same time, wants to discuss medications.  Taking a dim view of them and losing patience, but -- predictbaly -- he has deviated again from the plan for Klonopin timing.  Currently he is timing meds with meals, correctly taking 46m, 0.57m thn 0.11m53mbut finding a 6:30-7pm craving and taking another 0.5 mg.  Baffled by lithium -- 150m18mD, feels it's useless, is going to quit.  Also considering dropping Lamictal.  Confronted rash decision-making, acknowledged complexity to his regimen but urged that he really should work openly and closely with psychiatry to simplify choices, after getting Klonopin where it should be -- 8-hr dosing, not meals, and stick with 1 / 0.5 / 0.5 dosing unless they decide together to raise a does until restabilized.    Assessed vitamins -- says he is taking folic acid and B1 singly, but not taking B12 b/c he thought the others subsumed it.  Reeducated that B12 is actually more implicated in energy, alertness, and depression resistance than B1, so either he should take all three or move to B complex as the way to simplify and be more complete about what he is supplying himself.  Meanwhile, has redecided yet again on a church to attend, having had a favorable experience at [employer] now figuring  he'll attend [facility] [employer].  As for why, says because it doesn't matter (?), but on followup, it's b/c the [place] provide a local support (still odd -- he had claimed friendly experience at [place], which is [place]).  Reiterated that he may choose but he should stick long enough to wear off aggravated impressions and see how a place really is.  Therapeutic modalities: Cognitive Behavioral Therapy, Solution-Oriented/Positive Psychology, Ego-Supportive, and  Interpersonal  Mental Status/Observations:  Appearance:   Casual     Behavior:  Appropriate  Motor:  Normal  Speech/Language:   Clear and Coherent  Affect:  Appropriate  Mood:  dysthymic and irritable  Thought process:  Some flight  Thought content:    Rumination  Sensory/Perceptual disturbances:    WNL  Orientation:  Fully oriented  Attention:  Good    Concentration:  Fair  Memory:  grossly intact  Insight:    Fair  Judgment:   Fair  Impulse Control:  Fair   Risk Assessment: Danger to Self: No Self-injurious Behavior: No Danger to Others: No Physical Aggression / Violence: No Duty to Warn: No Access to Firearms a concern: No  Assessment of progress:  situational setback(s)  Diagnosis:   ICD-10-CM   1. Major depressive disorder, recurrent episode, moderate (HCC)  F33.1     2. 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 Generalized anxiety disorder  F41.1     4. Social anxiety disorder  F40.10     5. Long-term current use of benzodiazepine  Z79.899     6. Variable compliance with medication and medical advice  Z91.148     7. Insomnia due to other mental disorder  F51.05    F99     8. Mild cognitive impairment of uncertain or unknown etiology -- suspect combination of nutritional deficiency, anxiety, sedative effects, and dyssomnia with possible apnea  G31.84     9. Obstructive sleep apnea  G47.33     10. Heterozygous MTHFR mutation C677T  Z15.89      Plan:  Social connections and morale -- Still should emphasize finding friends over finding a lover.  Check with [employer] [employer] [facility].  Free choice about church, just stay clear he may have to dig in a while to truly know what can be available in community and friends, not just priests, or possible single gay men, and not settling for brief, anxiety-dominated experiences of supposed coldness.  And make sure not to impose unrealistic demands on people who don't know him.  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  May inquire with new or old priest at discretion about connecting to others at [employer], but de-emphasize dating.  If dating, don't have to use app, don't have to agonize, just keep it low key, see what compatibilities they have, and don't rush to any expectations of love or lust, especially given frightening sexual history and great inexperience.  Endorse getting a cat, but only if it is clear what the costs and responsibilities are, and whether he can meet them.  In the meantime, zero-obligation encounters like [employer] or [employer]. Medication organization and sedative dependence -- Keep meds organized, using a standard pillbox rather than rely on daily dispensing decisions and concentration.  Make sure to reconcile Klonopin dose schedule with medical advice, promptly and regularly. Nutrition and hydration -- Prefer methylated 123456 and folic acid based on MTHFR assessment, but understand cost.  Still  recommend OTC B complex preferable to B12, and should see if superior doses achieve benefit without methylated supply.  Maintain varied food sources, try to improve protein and vegetable content, restrain late carbs and sodium-laden juices.  Maintain adequate fluids, best use a measurable cup or jug to see, rather than trust intuition and sense of thirst, how much taken in during a day. Sleep disorder -- Maintain reasonably regular, timely bedtime, and electronics curfew.  Accept rest rather than sleep when not coming easy.  Curfew video, including porn watching, and limit daytime naps to help maintain circadian rhythm.  Trust in [place] to get in touch about setting up and orienting for CPAP, but may inquire with PCP office if need refresher on what to expect.   Financial organization -- Continue banking/arithmetic measures, including use of calculator, legible handwriting, and willingness to fresh start with the bank's balance if he strays.  Should  approach bank about his annuity and help budgeting. Regret/shame/impulsivity -- Reaffirm the good he tries to do, take heart in partial successes and good efforts, grant self permission to learn, and check drastic decisions before acting on them.  Notice if porn watching becomes a point of self-castigation and/or addiction. PCP relationship and medical organization -- Work with PCP in good faith to fully inform and ask necessary questions, fully comprehend medical advice.  Written instructions may help.  If unclear, ask, don't assume.  Make sure to comprehend referrals and instructions rather than let uncomfortable feelings drive irrational conclusion-jumping.  Address pain and sleep issues as noted, be willing to share suspicions here.  Care coordination with psychiatry and PCP ad lib. Responsible party -- Consider reinstating Juliann Pulse as his next-of-kin, possibly POA. Other recommendations/advice as may be noted above Continue to utilize previously learned skills ad lib Maintain medication as prescribed and work faithfully with relevant prescriber(s) if any changes are desired or seem indicated Call the clinic on-call service, 988/hotline, 911, or present to [facility] or ER if any life-threatening psychiatric crisis Return in about 1 week (around 03/18/2022). Already scheduled visit in this office 03/17/2022.  Blanchie Serve, PhD [name], PhD LP Clinical Psychologist, [facility] [address], [place], [place] [address] [phone]

## 2022-03-16 ENCOUNTER — Ambulatory Visit (INDEPENDENT_AMBULATORY_CARE_PROVIDER_SITE_OTHER): Payer: Medicare Other | Admitting: Psychiatry

## 2022-03-16 DIAGNOSIS — F332 Major depressive disorder, recurrent severe without psychotic features: Secondary | ICD-10-CM

## 2022-03-16 DIAGNOSIS — F99 Mental disorder, not otherwise specified: Secondary | ICD-10-CM

## 2022-03-16 DIAGNOSIS — F411 Generalized anxiety disorder: Secondary | ICD-10-CM | POA: Diagnosis not present

## 2022-03-16 DIAGNOSIS — Z1589 Genetic susceptibility to other disease: Secondary | ICD-10-CM

## 2022-03-16 DIAGNOSIS — F5105 Insomnia due to other mental disorder: Secondary | ICD-10-CM | POA: Diagnosis not present

## 2022-03-16 DIAGNOSIS — Z91148 Patient's other noncompliance with medication regimen for other reason: Secondary | ICD-10-CM

## 2022-03-16 DIAGNOSIS — F4010 Social phobia, unspecified: Secondary | ICD-10-CM | POA: Diagnosis not present

## 2022-03-16 DIAGNOSIS — R45851 Suicidal ideations: Secondary | ICD-10-CM

## 2022-03-16 DIAGNOSIS — G4733 Obstructive sleep apnea (adult) (pediatric): Secondary | ICD-10-CM

## 2022-03-16 DIAGNOSIS — G3184 Mild cognitive impairment, so stated: Secondary | ICD-10-CM

## 2022-03-16 NOTE — Progress Notes (Addendum)
Psychotherapy Progress Note [facility] Richard Moore, PhD LP  Patient ID: Richard Wilson [facility] "Richard Wilson")    MRN: [id number] Therapy format: Individual psychotherapy Date: 03/16/2022      Start: 10:15a     Stop: 11:15a     Time Spent: 60 min Location: In-person   Session narrative (presenting needs, interim history, self-report of stressors and symptoms, applications of prior therapy, status changes, and interventions made in session) Rough few days, feeling overwhelmed and demoralized.  Friday went to a gay bar nearby, apparently desperate for some company and possible sexual interaction, but felt rejected, then Saturday felt morose through the morning and in the afternoon laid out his meds and looked up whether each was sufficient to overdose.  Finding no clear path to successful suicide, started mulling over which church to attend Sunday, a decision he had apparently made clearly last week to be [facility], but at this point was ruminating on how unwelcome he felt there is no parishioners extended themselves to greet him, and the priest seemed insincere, had a dinner he sat in on.  Attended [facility], on [place], and felt much better report with the priest there, but still, parishioners did not extend themselves to make introduction to the new man in their midst.  As of today, feeling desperate still, heartbreakingly lonely, furious with the CPAP machine for his being unable to figure it out, and generally on edge about making it in this world.  At last report, CPAP had been worked out very smoothly and was comfortable, so apparently he undid a fitting and cannot refitted it himself, to the point of near fury and almost throwing it across the room.  Yesterday began looking up psychiatric hospitals and nursing homes, mainly seeking a place he could retreat to and have company and some reassurance, but, as he has repeatedly so far, coming to the conclusion he cannot afford  either one.  Again professes desperation, and breaks down in tears in the office with dramatic pronouncements made.  Validated distress and supportively confronted again the amount of pressure Richard Wilson puts on himself to perform in one moment and professing desperation in the next, noting that the demands he makes on himself to just know how to do things or make things work or react dramatically to people and things served to amp up his desperation.  Agreed that nursing home is out of the question and inpatient psychiatric is both very costly and unsatisfyingly temporary, broached the idea of IOP or PHP and oriented to DBT skills program as the best combination of distress tolerance skills, camaraderie, and affordability, made for times like this that pushed desperation but could credibly be treated outside the confinement of the hospital, as long as safety was assured.  Call made in session to [facility], which has served several other patients well and would be ideal program but is not covered by Medicare.  Plan made for Richard Wilson to check with the [facility] and [place] outpatient behavioral health services, select the most suitable, and begin a referral on his behalf.  Confirmed that he will attend med management tomorrow with Richard Wilson and keep weekly appointments with me until such time as his involvement in IOP can be confirmed.  Introduced mini mindfulness techniques in session, to good effect (nonjudgmental attention and naming 3 sounds, 3 physical sensations).  Able to report de-escalation in desperate feelings.  Also suggested begin brainstorming a "menu" of activities that help him calm.  Noted that  he can pull stitches out of some embroidery he no longer wants.  Became slightly giddy and more playful after settling this plan, too, telling of rediscovering some silver at home, perhaps the nicest he has, which he thought he had sold but pleased to find out is in hand.  Agreed that admiring it, or polishing  or tending it somehow could be one of items on his pleasant things to do list.  After an initial attempt to reason with him about attending his CPAP machine, agreed to table using it until he is willing to reach out to the DME provider to restore set up.  I feel certain it is merely a matter of mall adjusting his head gear, but in the [facility] is in, he will not have the patience to learn.  Says he is on calendar for PCP tomorrow afternoon to address what he believes is a bone spur on his heel, clearly indicating future plans and willingness to see through them and take the next steps in caring for himself, as opposed to true suicidality.    After session, calls made as noted below to clarify option for IOP with DBT, and staff message to psychiatry and PCP, both of whom are scheduled to see [facility] tomorrow, for continuity of care and acute needs with med management, sleep care, and safety monitoring.  Therapeutic modalities: Cognitive Behavioral Therapy, Solution-Oriented/Positive Psychology, Dialectical Behavioral Therapy, and directive  Mental Status/Observations:  Appearance:   Casual     Behavior:  A bit histrionic  Motor:  Normal  Speech/Language:   Clear and Coherent and loud at times  Affect:  Appropriate  Mood:  anxious, depressed, and irritable  Thought process:  Generally normal, some flight  Thought content:    Rumination  Sensory/Perceptual disturbances:    WNL  Orientation:  Fully oriented  Attention:  Good    Concentration:  Fair  Memory:  Some missing for last session, recent disclosures  Insight:    Fair  Judgment:   Fair  Impulse Control:  Fair   Risk Assessment: Danger to Self:  +SI, -intent/plan, +pledge  Self-injurious Behavior: No Danger to Others: No Physical Aggression / Violence: No Duty to Warn: No Access to Firearms a concern: No  Assessment of progress:  deteriorating -- in need of reassessment  Diagnosis:   ICD-10-CM   1. Severe episode of  recurrent major depressive disorder, without psychotic features ([place])  F33.2     2. Generalized anxiety disorder  F41.1     3. Social anxiety disorder  F40.10     4. Insomnia due to other mental disorder  F51.05    F99     5. Suicidal ideation  R45.851     6. Mild cognitive impairment of uncertain or unknown etiology -- suspect combination of nutritional deficiency, anxiety, sedative effects, and dyssomnia with possible apnea  G31.84     7. Obstructive sleep apnea  G47.33     8. Variable compliance with medication and medical advice  Z91.148    Simultaneosuly in Klonopin taper with unceratin timing/dosing, possibly quit Lamictal/lithium last week, and CPAP out of adjustment/unable to sef-correct    9. Heterozygous MTHFR mutation C677T  Z15.89      Plan:  Safety Pledge no overdose nor other suicide attempt, and willingness to call for help if sorely tempted Upcoming providers notified of present risk and plan IOP referral Pt to initiate contact once best referral is identified Tx to investigate appropriate, affordable day programs and  return message to PT.  Calls made today: [facility] -- Well-organized program with DBT emphasis but does not accept Medicare.  Recommended [facility] on [address] or [place], but they only provide standard outpatient service. [facility] -- Available information directs to [phone], which is voicemail for Dellia Nims, Med, CNA.  Returned call stated this program is still all-virtual (which would be offputting and highly frustrating for Pt).  Her recommendation would be self-/peer-help through [facility] [place] -- Referral number [phone]) turns out to be the ER assessment team, redirected to Outpatient services at [facility] [phone]).  Option 2, speak with a nurse, answered then call dropped on transfer.  Option 3, check on referral status, reached individual  voicemail.  No clear option for initiating a referral or learning about services. [facility] -- they have 6-8 week IOP with DBT emphasis, but Medicare will not cover less than PHP.  Referral would be made via form on website, or Pt may self-schedule assessment at [phone]. Case discussed with Richard Wilson, 2/14.  Agreed that short of PHP or inpt, community mental health nonprofit involvement is very appropriate call.  Passed along Psychologist, counselling for [employer] [phone]). Self-soothing  develop list of activities he can do that are calming Sleep management Continued emphasis on not staying up too late with video, consent to sleep at a reasonable hour Follow through with planned PCP visit tomorrow, and address breakdown of CPAP service PT encouraged to contact his DME provider directly to request a service visit.  Highly suspect his device will need to be reassembled and refit, then Velcro adjustment straps disabled, taped off, or "foolproofed" in some way Social support Should try to settle on a church for the time being and persist -- [place] may be the most emotionally accessible and prepared to be welcoming Should introduce to [facility] and see it through checking out the [employer] program Should introduce to [employer] for phone companion or other social support and possibly activities of interest Other recommendations/advice as may be noted above Continue to utilize previously learned skills ad lib Maintain medication as prescribed and work faithfully with relevant prescriber(s) if any changes are desired or seem indicated Call the clinic on-call service, 988/hotline, 911, or present to [facility] or ER if any life-threatening psychiatric crisis Return for as already scheduled, avail earlier @ PT's need; may CA if admitted to program. Already scheduled visit in this office 03/17/2022. (Psychiatry)  Blanchie Serve, PhD Richard Moore, PhD LP Clinical  Psychologist, [facility] [address], [place],  [address] [phone]

## 2022-03-17 ENCOUNTER — Ambulatory Visit (INDEPENDENT_AMBULATORY_CARE_PROVIDER_SITE_OTHER): Payer: Medicare Other | Admitting: Family Medicine

## 2022-03-17 ENCOUNTER — Ambulatory Visit (INDEPENDENT_AMBULATORY_CARE_PROVIDER_SITE_OTHER): Payer: Medicare Other | Admitting: Physician Assistant

## 2022-03-17 ENCOUNTER — Ambulatory Visit: Payer: Medicare Other | Admitting: Psychiatry

## 2022-03-17 ENCOUNTER — Encounter: Payer: Self-pay | Admitting: Physician Assistant

## 2022-03-17 ENCOUNTER — Encounter: Payer: Self-pay | Admitting: Family Medicine

## 2022-03-17 VITALS — BP 120/78 | HR 100 | Temp 97.9°F | Resp 18 | Wt 193.6 lb

## 2022-03-17 DIAGNOSIS — G4733 Obstructive sleep apnea (adult) (pediatric): Secondary | ICD-10-CM

## 2022-03-17 DIAGNOSIS — F331 Major depressive disorder, recurrent, moderate: Secondary | ICD-10-CM | POA: Diagnosis not present

## 2022-03-17 DIAGNOSIS — F411 Generalized anxiety disorder: Secondary | ICD-10-CM

## 2022-03-17 DIAGNOSIS — M79672 Pain in left foot: Secondary | ICD-10-CM | POA: Diagnosis not present

## 2022-03-17 DIAGNOSIS — Z91148 Patient's other noncompliance with medication regimen for other reason: Secondary | ICD-10-CM

## 2022-03-17 DIAGNOSIS — R45851 Suicidal ideations: Secondary | ICD-10-CM | POA: Diagnosis not present

## 2022-03-17 DIAGNOSIS — Z79899 Other long term (current) drug therapy: Secondary | ICD-10-CM

## 2022-03-17 NOTE — Patient Instructions (Addendum)
Recommended by Dr. [name]: [facility]'s peer support and wellness classes. Phone # [phone]

## 2022-03-17 NOTE — Addendum Note (Signed)
Addended by: Blanchie Serve A on: 03/17/2022 03:42 PM   Modules accepted: Level of Service

## 2022-03-17 NOTE — Progress Notes (Unsigned)
   Subjective:    Patient ID: Richard Wilson, male    DOB: Dec 26, 1950, 72 y.o.   MRN: [id number]  HPI He is here for consult concerning multiple issues.  He does have OSA but has been unable to get the mask to work correctly.  When asked if he had called the DME company he said no.  Then describe some left foot pain in the area of the heel but no history of injury or overuse. He also continues to be followed for his underlying depression.  He has been seen by Clinton Quant and Donnal Moat.  He states that he was given the name of a facility that could help with some of his psychological issues.  He states when he called he did not have a male therapist and he very much would prefer to have a male therapist.  Today's he seems to be in much better spirits.  Review of Systems     Objective:   Physical Exam Alert and in no distress with appropriate affect.  Exam of his left heel shows no palpable tenderness       Assessment & Plan:  Pain of left heel  Obstructive sleep apnea  Moderate episode of recurrent major depressive disorder (HCC) Recommend heel cups to take some the pressure off of his Achilles tendon area.  Reassured him that I did not find anything of major concern there. I then recommend that he call the DME organization that he got his CPAP from and discuss various other options for a mask explained to him that sometimes it takes a while to get one that fits properly. I then recommended that he call Donnal Moat or Blanchie Serve for the names of other facilities that he can then check out to see if they have a male therapist.

## 2022-03-17 NOTE — Progress Notes (Signed)
[facility]  Patient ID: Richard Wilson,  MRN: [id number]  PCP: Denita Lung, MD  Date of Evaluation: 03/17/2022 Tme spent:30 minutes  Chief Complaint:  Chief Complaint   Depression; Other    HISTORY/CURRENT STATUS: HPI For routine med check.    Hasn't been doing well the past week or so. Saw Dr. [name] in counseling yesterday, they discussed his recent behaviors. (See his note) Pt was very depressed to the point he 'googled all my medications to see which ones would kill me if I took too many. I found out that none of them would.' States he threw up his hands and said 'oh well, I guess I'm stuck.' No other plan to commit suicide, but thinks he'd be better off dead.   Has had passive SI for 'awhile.' Has no joy in life except reading, States he didn't go to the hospital a few days ago when he had strong suicidal thoughts b/c "who would bring me books to read?" Trying to get involved in church but hasn't found a good fit yet. Very lonely. Feels hopeless.  Energy and motivation are fair.. Still doesn't sleep well.  Frustrated w/ CPAP, apparently used only a few times.  ADLs and personal hygiene are normal.  Memory is the same, lacking but thinks he remembers things as he should.    Appetite has not changed.  Weight is stable.  No HI.  He's tolerating the lower dose Klonopin well. He correctly states the directions and says he's taking as directed. No PA. Easily overwhelmed though.   Patient denies increased energy with decreased need for sleep, increased talkativeness, racing thoughts, impulsivity or risky behaviors, increased spending, increased libido, grandiosity, increased irritability or anger, paranoia, or hallucinations.  Denies dizziness, syncope, seizures, numbness, tremor, tics, unsteady gait, slurred speech, confusion.  Denies dystonia.  Individual Medical History/ Review of Systems: Changes? :Yes   ER note 03/02/2022  Past medications for mental health diagnoses  include: Rozerem, Trazodone caused vivid dreams, Xanax, Buspar, mirtazapine caused excessive somnolence and fatigue with flulike symptoms, Belsomra, propranolol for tremor, Zoloft, Abilify caused tremor, Klonopin, Effexor, Ativan, Wellbutrin, Nardil, gabapentin, Librium, Luvox, Amitriptyline   Pertinent info after reviewing Dr. Evelena Leyden notes, his previous psychiatrist in [facility]. Past suicide attempt in September 1996, by taking Tylenol.  He was admitted at that time to a psychiatric hospital in [facility]  Admitted to psych hospital multiple times in the past for "nervous breakdowns."  January 1985, February 1986, October 1991, September 1996, July 2000.  Allergies: Gentamicin and Penicillins  Current Medications:  Current Outpatient Medications:    B Complex Vitamins (VITAMIN B COMPLEX) TABS, Take 1 tablet by mouth daily. (Patient not taking: Reported on 03/19/2022), Disp: , Rfl:    Cholecalciferol (VITAMIN D3) 50 MCG (2000 UT) TABS, Take 1 tablet by mouth daily. (Patient not taking: Reported on 03/19/2022), Disp: 90 tablet, Rfl: 0   clonazePAM (KLONOPIN) 1 MG tablet, TAKE 1 TABLET BY MOUTH IN THE MORNING, 1/2 TABLET MIDDAY, 1/2 TABLET AT BEDTIME. (Patient taking differently: Take 0.5-1 mg by mouth See admin instructions. TAKE 1 TABLET BY MOUTH IN THE MORNING, 1/2 TABLET MIDDAY, 1/2 TABLET AT BEDTIME.), Disp: 60 tablet, Rfl: 1   fluvoxaMINE (LUVOX) 100 MG tablet, Take 2 tablets (200 mg total) by mouth at bedtime. (Patient not taking: Reported on 03/23/2022), Disp: 60 tablet, Rfl: 2   folic acid (FOLVITE) 1 MG tablet, TAKE 1 TABLET BY MOUTH EVERY DAY, Disp: 90 tablet, Rfl: 1  hydrocortisone 2.5 % cream, Apply 1 Application topically as needed (itching). (Patient not taking: Reported on 03/23/2022), Disp: , Rfl:    lamoTRIgine (LAMICTAL) 150 MG tablet, TAKE 1 TABLET BY MOUTH EVERY DAY, Disp: 90 tablet, Rfl: 0   lithium carbonate 150 MG capsule, TAKE 1 CAPSULE  (150 MG TOTAL) BY MOUTH 2 (TWO) TIMES DAILY WITH A MEAL., Disp: 180 capsule, Rfl: 0   omeprazole (PRILOSEC) 20 MG capsule, Take 20 mg by mouth daily. prn, Disp: , Rfl:    vitamin B-12 (CYANOCOBALAMIN) 1000 MCG tablet, Take 1,000 mcg by mouth daily. (Patient not taking: Reported on 03/19/2022), Disp: , Rfl:    amitriptyline (ELAVIL) 25 MG tablet, Take 25 mg by mouth daily. (Patient not taking: Reported on 03/23/2022), Disp: , Rfl:    fluvoxaMINE (LUVOX) 50 MG tablet, Take 1 tablet (50 mg total) by mouth at bedtime., Disp: 14 tablet, Rfl: 0   Probiotic Product (DAILY PROBIOTIC PO), Take 1 capsule by mouth daily., Disp: , Rfl:    thiamine (VITAMIN B-1) 100 MG tablet, Take 100 mg by mouth daily., Disp: , Rfl:    thiamine (VITAMIN B1) 100 MG tablet, TAKE 1 TABLET BY MOUTH EVERY DAY, Disp: 100 tablet, Rfl: 0 Medication Side Effects: sexual dysfunction   Family Medical/ Social History: Changes?  no  MENTAL HEALTH EXAM:  There were no vitals taken for this visit.There is no height or weight on file to calculate BMI.  General Appearance: Casual and Well Groomed  Eye Contact:  Good  Speech:  Clear and Coherent, Normal Rate, and Talkative  Volume:  Normal  Mood:  Hopeless and Worthless  Affect:  Congruent  Thought Process:  Goal Directed and Descriptions of Associations: Circumstantial  Orientation:  Full (Time, Place, and Person)  Thought Content: Logical   Suicidal Thoughts:  Yes.  without intent/plan  Homicidal Thoughts:  No  Memory:   at his baseline  Judgement:  Good  Insight:  Good  Psychomotor Activity:  Normal  Concentration:  Concentration: Fair and Attention Span: Fair  Recall:  Good  Fund of Knowledge: Good  Language: Good  Assets:  Desire for Improvement Financial Resources/Insurance Housing Transportation  ADL's:  Intact  Cognition: WNL  Prognosis:  Fair   Gene site test results are on chart under media. See neuro psych test results on chart.[name] [name]  01/21/2020  Note from neuro, Sharene Butters, PA-C from 07/21/2021.   DIAGNOSES:    ICD-10-CM   1. Suicidal ideation  R45.851     2. Obstructive sleep apnea  G47.33     3. Generalized anxiety disorder  F41.1     4. Major depressive disorder, recurrent episode, moderate (HCC)  F33.1     5. Variable compliance with medication and medical advice  Z91.148     6. Long-term current use of benzodiazepine  Z79.899       Receiving Psychotherapy: Yes    Dr. Jonni Sanger Mitchum.   RECOMMENDATIONS:  PDMP was reviewed.  Klonopin last filled 03/08/2022. I provided 30 minutes of face to face time during this encounter, including time spent before and after the visit in records review, medical decision making, counseling pertinent to today's visit, and charting.   Contract for safety in place. Call the office on-call service, 988/hotline, 911, or present to [facility] or ER if any life-threatening psychiatric crisis. Patient verbalizes understanding.   [name] brought his medications for my review. All are there, with correct sig on bottles, except Luvox which he  swore to me he's taking, forgot to put it in the bag.   In order to avoid further confusion, I'm making no med changes. Increasing Li may help w/ SI, but at this point, I feel that making a change will do more harm than good.   Dr. [name] has been working to find resources available for DBT, (see his note 03/16/2022.) As requested, I gave pt info on [facility], put on AVS with instructions for Admin staff to make sure [name] takes that at [place], and to hi-light the info.   Continue Klonopin 1 mg by continuing 1 p.o. every morning, 1/2 pill midday, and continue evening dose,1/2 pill. Continue Luvox 100 mg, 2 qhs. Continue Lamictal 150 mg daily. Continue lithium 150 mg, 1 p.o. twice daily. Continue multivitamin, B complex, folic acid, thiamine, and B12. Continue therapy with Dr. [name]. Return in 6 weeks.  Donnal Moat, PA-C

## 2022-03-18 ENCOUNTER — Encounter (HOSPITAL_COMMUNITY): Payer: Self-pay

## 2022-03-18 ENCOUNTER — Other Ambulatory Visit: Payer: Self-pay

## 2022-03-18 ENCOUNTER — Emergency Department (HOSPITAL_COMMUNITY)
Admission: EM | Admit: 2022-03-18 | Discharge: 2022-03-19 | Disposition: A | Discharge status: Home / Self Care | Payer: Medicare Other | Attending: Emergency Medicine | Admitting: Emergency Medicine

## 2022-03-18 DIAGNOSIS — R4589 Other symptoms and signs involving emotional state: Secondary | ICD-10-CM | POA: Insufficient documentation

## 2022-03-18 DIAGNOSIS — F32A Depression, unspecified: Secondary | ICD-10-CM

## 2022-03-18 DIAGNOSIS — F411 Generalized anxiety disorder: Secondary | ICD-10-CM | POA: Insufficient documentation

## 2022-03-18 DIAGNOSIS — G4700 Insomnia, unspecified: Secondary | ICD-10-CM | POA: Diagnosis not present

## 2022-03-18 DIAGNOSIS — Z79899 Other long term (current) drug therapy: Secondary | ICD-10-CM | POA: Insufficient documentation

## 2022-03-18 DIAGNOSIS — F332 Major depressive disorder, recurrent severe without psychotic features: Secondary | ICD-10-CM | POA: Diagnosis not present

## 2022-03-18 DIAGNOSIS — Z1152 Encounter for screening for COVID-19: Secondary | ICD-10-CM | POA: Diagnosis not present

## 2022-03-18 LAB — CBC WITH DIFFERENTIAL/PLATELET
Abs Immature Granulocytes: 0.03 10*3/uL (ref 0.00–0.07)
Basophils Absolute: 0.1 10*3/uL (ref 0.0–0.1)
Basophils Relative: 1 %
Eosinophils Absolute: 0.3 10*3/uL (ref 0.0–0.5)
Eosinophils Relative: 4 %
HCT: 56.1 % — ABNORMAL HIGH (ref 39.0–52.0)
Hemoglobin: 17.7 g/dL — ABNORMAL HIGH (ref 13.0–17.0)
Immature Granulocytes: 0 %
Lymphocytes Relative: 21 %
Lymphs Abs: 1.5 10*3/uL (ref 0.7–4.0)
MCH: 27.9 pg (ref 26.0–34.0)
MCHC: 31.6 g/dL (ref 30.0–36.0)
MCV: 88.3 fL (ref 80.0–100.0)
Monocytes Absolute: 0.7 10*3/uL (ref 0.1–1.0)
Monocytes Relative: 10 %
Neutro Abs: 4.6 10*3/uL (ref 1.7–7.7)
Neutrophils Relative %: 64 %
Platelets: 208 10*3/uL (ref 150–400)
RBC: 6.35 MIL/uL — ABNORMAL HIGH (ref 4.22–5.81)
RDW: 13.7 % (ref 11.5–15.5)
WBC: 7.2 10*3/uL (ref 4.0–10.5)
nRBC: 0 % (ref 0.0–0.2)

## 2022-03-18 LAB — COMPREHENSIVE METABOLIC PANEL
ALT: 19 U/L (ref 0–44)
AST: 20 U/L (ref 15–41)
Albumin: 4.4 g/dL (ref 3.5–5.0)
Alkaline Phosphatase: 118 U/L (ref 38–126)
Anion gap: 10 (ref 5–15)
BUN: 13 mg/dL (ref 8–23)
CO2: 26 mmol/L (ref 22–32)
Calcium: 9.4 mg/dL (ref 8.9–10.3)
Chloride: 104 mmol/L (ref 98–111)
Creatinine, Ser: 1.52 mg/dL — ABNORMAL HIGH (ref 0.61–1.24)
GFR, Estimated: 49 mL/min — ABNORMAL LOW (ref >60)
Glucose, Bld: 91 mg/dL (ref 70–99)
Potassium: 4.1 mmol/L (ref 3.5–5.1)
Sodium: 140 mmol/L (ref 135–145)
Total Bilirubin: 1.1 mg/dL (ref 0.3–1.2)
Total Protein: 7.2 g/dL (ref 6.5–8.1)

## 2022-03-18 LAB — RAPID URINE DRUG SCREEN, HOSP PERFORMED
Amphetamines: NOT DETECTED
Barbiturates: NOT DETECTED
Benzodiazepines: POSITIVE — AB
Cocaine: NOT DETECTED
Opiates: NOT DETECTED
Tetrahydrocannabinol: NOT DETECTED

## 2022-03-18 LAB — ETHANOL: Alcohol, Ethyl (B): <10 mg/dL (ref <10)

## 2022-03-18 NOTE — ED Notes (Signed)
Pt wanded by security no contraband found belongings place in locker 28.

## 2022-03-18 NOTE — ED Notes (Signed)
Belongings removed from bedside and placed in storage by Paramedic Dylan. Pt is now dressed out in appropriate scrubs

## 2022-03-18 NOTE — ED Notes (Signed)
Pt set up for TTS

## 2022-03-18 NOTE — ED Provider Notes (Addendum)
[facility] Provider Note   CSN: [id number] Arrival date & time: 03/18/22  1804     History  Chief Complaint  Patient presents with   Psychiatric Evaluation    Richard Wilson is a 72 y.o. male.  The history is provided by the patient and medical records. No language interpreter was used.  Mental Health Problem Presenting symptoms: depression   Presenting symptoms: no agitation, no hallucinations, no homicidal ideas, no suicidal thoughts, no suicidal threats and no suicide attempt   Degree of incapacity (severity):  Severe Onset quality:  Gradual Duration:  2 weeks Timing:  Constant Progression:  Waxing and waning Chronicity:  New Context: not recent medication change   Treatment compliance:  Untreated Relieved by:  Nothing Worsened by:  Nothing Ineffective treatments:  Antidepressants Associated symptoms: feelings of worthlessness and insomnia   Associated symptoms: no abdominal pain, no anxiety, no chest pain, no fatigue and no headaches   Risk factors: hx of mental illness and hx of suicide attempts        Home Medications Prior to Admission medications   Medication Sig Start Date End Date Taking? Authorizing Provider  B Complex Vitamins (VITAMIN B COMPLEX) TABS Take 1 tablet by mouth daily.    [provider]  Cholecalciferol (VITAMIN D3) 50 MCG (2000 UT) TABS Take 1 tablet by mouth daily. 12/18/21   Tysinger, Camelia Eng, PA-C  clonazePAM (KLONOPIN) 1 MG tablet TAKE 1 TABLET BY MOUTH IN THE MORNING, 1/2 TABLET MIDDAY, 1/2 TABLET AT BEDTIME. 03/08/22   Hurst, Helene Kelp T, PA-C  fluvoxaMINE (LUVOX) 100 MG tablet Take 2 tablets (200 mg total) by mouth at bedtime. 02/16/22   Donnal Moat T, PA-C  folic acid (FOLVITE) 1 MG tablet TAKE 1 TABLET BY MOUTH EVERY DAY 01/14/22   Denita Lung, MD  hydrocortisone 2.5 % cream Apply 1 Application topically daily. 09/21/21   [provider]  lamoTRIgine (LAMICTAL) 150 MG  tablet TAKE 1 TABLET BY MOUTH EVERY DAY 03/02/22   Donnal Moat T, PA-C  lithium carbonate 150 MG capsule TAKE 1 CAPSULE (150 MG TOTAL) BY MOUTH 2 (TWO) TIMES DAILY WITH A MEAL. 02/23/22   Addison Lank, PA-C  omeprazole (PRILOSEC) 20 MG capsule Take 20 mg by mouth daily. prn    [provider]  Probiotic Product (DAILY PROBIOTIC PO) Take 1 capsule by mouth daily.    [provider]  thiamine (VITAMIN B1) 100 MG tablet Take 1 tablet (100 mg total) by mouth daily. 12/18/21   Tysinger, Camelia Eng, PA-C  vitamin B-12 (CYANOCOBALAMIN) 1000 MCG tablet Take 1,000 mcg by mouth daily.    [provider]      Allergies    Gentamicin and Penicillins    Review of Systems   Review of Systems  Constitutional:  Negative for chills, fatigue and fever.  HENT:  Negative for congestion.   Eyes:  Negative for visual disturbance.  Respiratory:  Negative for cough, chest tightness and shortness of breath.   Cardiovascular:  Negative for chest pain and palpitations.  Gastrointestinal:  Negative for abdominal distention, abdominal pain, constipation, diarrhea, nausea and vomiting.  Genitourinary:  Negative for dysuria and frequency.  Musculoskeletal:  Negative for back pain, neck pain and neck stiffness.  Skin:  Negative for rash and wound.  Neurological:  Negative for dizziness, seizures, speech difficulty, weakness, light-headedness, numbness and headaches.  Hematological:  Negative for adenopathy.  Psychiatric/Behavioral:  Negative for agitation, confusion, hallucinations, homicidal ideas  and suicidal ideas. The patient has insomnia. The patient is not nervous/anxious.   All other systems reviewed and are negative.   Physical Exam Updated Vital Signs BP (!) 148/93   Pulse 81   Temp 98 F (36.7 C)   Resp 18   SpO2 100%  Physical Exam Vitals and nursing note reviewed.  Constitutional:      General: He is not in acute distress.    Appearance: He is well-developed. He is not  ill-appearing, toxic-appearing or diaphoretic.  HENT:     Head: Normocephalic and atraumatic.     Nose: Nose normal.     Mouth/Throat:     Mouth: Mucous membranes are moist.  Eyes:     Extraocular Movements: Extraocular movements intact.     Conjunctiva/sclera: Conjunctivae normal.     Pupils: Pupils are equal, round, and reactive to light.  Cardiovascular:     Rate and Rhythm: Normal rate and regular rhythm.     Heart sounds: No murmur heard. Pulmonary:     Effort: Pulmonary effort is normal. No respiratory distress.     Breath sounds: Normal breath sounds. No wheezing, rhonchi or rales.  Chest:     Chest wall: No tenderness.  Abdominal:     General: Abdomen is flat.     Palpations: Abdomen is soft.     Tenderness: There is no abdominal tenderness. There is no right CVA tenderness, left CVA tenderness, guarding or rebound.  Musculoskeletal:        General: No swelling or tenderness.     Cervical back: Neck supple. No tenderness.  Skin:    General: Skin is warm and dry.     Capillary Refill: Capillary refill takes less than 2 seconds.     Findings: No erythema or rash.  Neurological:     General: No focal deficit present.     Mental Status: He is alert.     Sensory: No sensory deficit.     Motor: No weakness.  Psychiatric:        Attention and Perception: Perception normal.        Mood and Affect: Mood is depressed. Affect is tearful.        Behavior: Behavior is not aggressive.        Thought Content: Thought content does not include homicidal or suicidal ideation. Thought content does not include homicidal plan.     ED Results / Procedures / Treatments   Labs (all labs ordered are listed, but only abnormal results are displayed) Labs Reviewed  COMPREHENSIVE METABOLIC PANEL - Abnormal; Notable for the following components:      Result Value   Creatinine, Ser 1.52 (*)    GFR, Estimated 49 (*)    All other components within normal limits  RAPID URINE DRUG SCREEN,  HOSP PERFORMED - Abnormal; Notable for the following components:   Benzodiazepines POSITIVE (*)    All other components within normal limits  CBC WITH DIFFERENTIAL/PLATELET - Abnormal; Notable for the following components:   RBC 6.35 (*)    Hemoglobin 17.7 (*)    HCT 56.1 (*)    All other components within normal limits  ETHANOL    EKG EKG Interpretation  Date/Time:  Thursday March 18 2022 19:58:34 EST Ventricular Rate:  80 PR Interval:  152 QRS Duration: 136 QT Interval:  382 QTC Calculation: 440 R Axis:   58 Text Interpretation: Normal sinus rhythm Biatrial enlargement Right bundle branch block Abnormal ECG When compared with ECG of 02-Nov-2021 05:22,  PREVIOUS ECG IS PRESENT when compared to prior, overall similar appearance. No STEMI Confirmed by Antony Blackbird [phone]) on 03/18/2022 8:07:25 PM  Radiology No results found.  Procedures Procedures    Medications Ordered in ED Medications - No data to display  ED Course/ Medical Decision Making/ A&P                             Medical Decision Making   Richard Wilson is a 72 y.o. male with a past medical history significant for anxiety, major depression, hyperlipidemia, GERD, CKD, and kidney stones who presents for worsening depression.  According to patient, he has had multiple suicide attempts in the past and despite taking all his medicines, he had worsened depression the last few weeks.  He said that he is not completing his daily tasks such as doing his dishes or cleaning up around the house.  He is not sleeping well and is more irritable.  He said that he is not currently suicidal but he is concerned that he is not "going to be able to go on" if he does not start feeling better.  He is denying any physical symptoms with no fevers, chills, congestion, cough, nausea, vomiting, constipation, diarrhea, or urinary changes.  No trauma.  No pain at this time.  He denies hallucinations or homicidal ideation but says that his  depression is worsening and worsening and he says he has no hope.  He called his primary mental health provider today and they told him to try to get seen as an outpatient at another facility but he is concerned he needs more acute intervention.  Patient tearful during exam.  On exam, lungs were clear.  Chest nontender.  Abdomen nontender.  No focal neurologic deficits.  Good pulses in extremities.  No evidence of acute trauma.  Given the patient's report of multiple suicide attempts and him saying that he does not think he is going to be able to go on with worsening depression despite doing all the right things, I am concerned about this patient.  We will get screening labs but given his lack of any physical complaints of low suspicion for medical problem at this time.  Will go ahead and consult TTS for evaluation and management.  Patient's labs returned and I feel he is now medically clear.  His CBC is reassuring and he may be slightly dehydrated with elevated hemoglobin.  Metabolic panel shows creatinine 1.52 similar to the 1.4 she has had in the past.  UDS shows benzodiazepines and EtOH is undetectable.  Will await psychiatry recommendations for further management of his worsening depression and history of suicide attempts.   11:24 PM Psychiatry message saying inpatient management recommended.  Will await admission.      Final Clinical Impression(s) / ED Diagnoses Final diagnoses:  Depression, unspecified depression type     Clinical Impression: 1. Depression, unspecified depression type     Disposition: Awaiting psychiatric recommendations.  He is here voluntarily at this time.  This note was prepared with assistance of Systems analyst. Occasional wrong-word or sound-a-like substitutions may have occurred due to the inherent limitations of voice recognition software.     Layli Capshaw, Gwenyth Allegra, MD 03/18/22 2107    Noell Shular, Gwenyth Allegra, MD 03/18/22  [phone]

## 2022-03-18 NOTE — ED Triage Notes (Signed)
C/o decreased sleep, racing thoughts, impulsive behaviors, increased irritability/anger.  Pt feels the pressure and need to get things done and has made increased stress on him x2 weeks.  Pt reports seeing therapist recently for depression.  Denies SI/HI

## 2022-03-19 ENCOUNTER — Telehealth: Payer: Self-pay

## 2022-03-19 DIAGNOSIS — F411 Generalized anxiety disorder: Secondary | ICD-10-CM | POA: Diagnosis not present

## 2022-03-19 DIAGNOSIS — F332 Major depressive disorder, recurrent severe without psychotic features: Secondary | ICD-10-CM | POA: Diagnosis not present

## 2022-03-19 LAB — RESP PANEL BY RT-PCR (RSV, FLU A&B, COVID)  RVPGX2
Influenza A by PCR: NEGATIVE
Influenza B by PCR: NEGATIVE
Resp Syncytial Virus by PCR: NEGATIVE
SARS Coronavirus 2 by RT PCR: NEGATIVE

## 2022-03-19 MED ORDER — THIAMINE MONONITRATE 100 MG PO TABS
100.0000 mg | ORAL_TABLET | Freq: Every day | ORAL | Status: DC
  Administered 2022-03-19: 100 mg via ORAL
  Filled 2022-03-19: qty 1

## 2022-03-19 MED ORDER — VITAMIN B-12 1000 MCG PO TABS: 1000.0000 ug | ORAL_TABLET | Freq: Every day | ORAL | Status: DC

## 2022-03-19 MED ORDER — VITAMIN D 25 MCG (1000 UNIT) PO TABS: 2000.0000 [IU] | ORAL_TABLET | Freq: Every day | ORAL | Status: DC

## 2022-03-19 MED ORDER — THIAMINE HCL 100 MG PO TABS: 100.0000 mg | ORAL_TABLET | Freq: Every day | ORAL | Status: DC

## 2022-03-19 MED ORDER — LITHIUM CARBONATE 150 MG PO CAPS
150.0000 mg | ORAL_CAPSULE | Freq: Two times a day (BID) | ORAL | Status: DC
  Administered 2022-03-19: 150 mg via ORAL
  Filled 2022-03-19 (×2): qty 1

## 2022-03-19 MED ORDER — PANTOPRAZOLE SODIUM 40 MG PO TBEC
40.0000 mg | DELAYED_RELEASE_TABLET | Freq: Every day | ORAL | Status: DC
  Administered 2022-03-19: 40 mg via ORAL
  Filled 2022-03-19: qty 1

## 2022-03-19 MED ORDER — CHOLECALCIFEROL 50 MCG (2000 UT) PO TABS: 1.0000 | ORAL_TABLET | Freq: Every day | ORAL | Status: DC

## 2022-03-19 MED ORDER — MELATONIN 3 MG PO TABS
3.0000 mg | ORAL_TABLET | Freq: Every day | ORAL | Status: DC
  Administered 2022-03-19: 3 mg via ORAL
  Filled 2022-03-19: qty 1

## 2022-03-19 MED ORDER — B COMPLEX-C PO TABS: 1.0000 | ORAL_TABLET | Freq: Every day | ORAL | Status: DC

## 2022-03-19 MED ORDER — CLONAZEPAM 1 MG PO TABS
1.0000 mg | ORAL_TABLET | Freq: Three times a day (TID) | ORAL | Status: DC
  Filled 2022-03-19: qty 1

## 2022-03-19 MED ORDER — CLONAZEPAM 1 MG PO TABS
1.0000 mg | ORAL_TABLET | Freq: Every day | ORAL | Status: DC
  Administered 2022-03-19: 1 mg via ORAL

## 2022-03-19 MED ORDER — DAILY PROBIOTIC PO CAPS: ORAL_CAPSULE | Freq: Every day | ORAL | Status: DC

## 2022-03-19 MED ORDER — CLONAZEPAM 0.5 MG PO TABS: 0.5000 mg | ORAL_TABLET | Freq: Every day | ORAL | Status: DC

## 2022-03-19 MED ORDER — LAMOTRIGINE 25 MG PO TABS
150.0000 mg | ORAL_TABLET | Freq: Every day | ORAL | Status: DC
  Administered 2022-03-19: 150 mg via ORAL
  Filled 2022-03-19: qty 2

## 2022-03-19 MED ORDER — FOLIC ACID 1 MG PO TABS
1.0000 mg | ORAL_TABLET | Freq: Every day | ORAL | Status: DC
  Administered 2022-03-19: 1 mg via ORAL
  Filled 2022-03-19: qty 1

## 2022-03-19 MED ORDER — VITAMIN B COMPLEX PO TABS: 1.0000 | ORAL_TABLET | Freq: Every day | ORAL | Status: DC

## 2022-03-19 MED ORDER — RISAQUAD PO CAPS: 1.0000 | ORAL_CAPSULE | Freq: Every day | ORAL | Status: DC

## 2022-03-19 MED ORDER — FLUVOXAMINE MALEATE 50 MG PO TABS
200.0000 mg | ORAL_TABLET | Freq: Every day | ORAL | Status: DC
  Filled 2022-03-19: qty 4

## 2022-03-19 NOTE — ED Notes (Signed)
[facility] arrived to pick up pt.  Explained to pt that transportation was here and that he was ready to leave to go to [facility] for the help he was seeking. Pt stating that he does not want to go at this time and that he is no longer seeking care and wishes to be discharged home.  Will alert psychiatry.

## 2022-03-19 NOTE — ED Provider Notes (Signed)
Emergency Medicine Observation Re-evaluation Note  Richard Wilson is a 71 y.o. male, seen on rounds today.  Pt initially presented to the ED for complaints of Psychiatric Evaluation Currently, the patient is asleep.  Physical Exam  BP (!) 136/90 (BP Location: Right Arm)   Pulse 65   Temp 98.1 F (36.7 C) (Oral)   Resp 17   SpO2 100%  Physical Exam General: Deferred, asleep Cardiac: Deferred, asleep Lungs: Deferred, sleep Psych: Deferred, asleep  ED Course / MDM  EKG:EKG Interpretation  Date/Time:  Thursday March 18 2022 19:58:34 EST Ventricular Rate:  80 PR Interval:  152 QRS Duration: 136 QT Interval:  382 QTC Calculation: 440 R Axis:   58 Text Interpretation: Normal sinus rhythm Biatrial enlargement Right bundle branch block Abnormal ECG When compared with ECG of 02-Nov-2021 05:22, PREVIOUS ECG IS PRESENT when compared to prior, overall similar appearance. No STEMI Confirmed by Antony Blackbird [phone]) on 03/18/2022 8:07:25 PM  I have reviewed the labs performed to date as well as medications administered while in observation.  Recent changes in the last 24 hours include Home meds being ordered this morning.  Plan  Current plan is for geropsych inpatient admission.  Is going to [facility] this morning.    Sherwood Gambler, MD 03/19/22 [phone]

## 2022-03-19 NOTE — Progress Notes (Signed)
Gero-Inpatient Behavioral Health Placement  Pt meets inpatient criteria per [name], NP. There are no available beds at [place] per [facility].  Referral was sent to the following facilities;   Destination  Service Provider Address Phone Fax  [facility]  [address], [place] [address] [phone]  [facility]  [address] [place], [place] [address] [phone]  [place] [address] [place] [address] [phone]  [facility]  [address] [place] [address] [place]  [facility]  [address] [place] [address] [phone]  [facility]  [place], [place] [address] [phone] [facility]  [address] [place], [place] [address] [phone] [place] [address] [place] [address] [place]  [facility]  [address] [place] [address] [phone]  [facility]  [address], [place] [address] [phone]  [place] [address], [place] [address] [id number]  [facility]  [place] [address] [phone] [place]  [address] [place] [address] [phone] [facility]  [address] [place] [address] [phone] [place]  [address], [place] [address] [phone] [facility]  [address], [place] [address] [phone] [place] [phone]  [phone]  [facility]  [place], [place] [address] [id number]  [facility]  [address], [place] [address] [phone] [facility]  [address], [place] [address] [phone] [facility]  [place], [place]  [address] [id number]    Situation ongoing,  CSW will follow up.   Benjaman Kindler, MSW, LCSWA 03/19/2022  @ 1:16 AM

## 2022-03-19 NOTE — ED Notes (Signed)
Reginold Agent, NP at bedside to talk to pt.

## 2022-03-19 NOTE — ED Notes (Addendum)
Lab results faxed to [phone] c/o [place] at [facility] . Pt can come to [facility] after 0800 accepting physician Dr.  Lynnell Chad to main campus report can be called to [phone]

## 2022-03-19 NOTE — ED Notes (Signed)
Pt remains awake and is requesting something for sleep EDP made aware.

## 2022-03-19 NOTE — ED Notes (Signed)
Patient alert and cooperative.  Patient appropriate with staff. Patient medication compliant. Patient endorses depression. Patient denies suicidal ideation.

## 2022-03-19 NOTE — ED Notes (Signed)
Pt is sleeping with no signs of distress or sleep disturbance respirations are easy skin color WNL.

## 2022-03-19 NOTE — ED Notes (Signed)
Pt to desk at this time, states that he wants to leave and does not want to wait for [facility] to take him to [facility].  Pt denies SI at this time, states that "I have had my pills this morning and I am better".

## 2022-03-19 NOTE — ED Provider Notes (Signed)
  Physical Exam  BP (!) 136/90 (BP Location: Right Arm)   Pulse 65   Temp 98.1 F (36.7 C) (Oral)   Resp 17   SpO2 100%   Physical Exam  Procedures  Procedures  ED Course / MDM    Medical Decision Making Patient was initially going to [facility].  However Reginold Agent, NP from psychiatry saw patient. Per her note "atient vehemently denied feeling suicidal at this time.  He plans to see his Psychiatrist on Monday and staff at [address] has agreed to make appointment for patient on Monday.  Patient will continue to take his medications and will be seen on Monday.  He is Psychiatrically cleared."  Patient is stable for discharge at this time.  Problems Addressed: Depression, unspecified depression type: chronic illness or injury  Amount and/or Complexity of Data Reviewed Labs: ordered. Decision-making details documented in ED Course.          Drenda Freeze, MD 03/19/22 [phone]

## 2022-03-19 NOTE — BH Assessment (Addendum)
Comprehensive Clinical Assessment (CCA) Note  03/19/2022 Richard Wilson [id number] Disposition: Clinician discussed patient care with Richard Score, NP.  She recommends inpatient geropsych for patient.  Clinician informed RN Richard Wilson and Dr. Sherry Wilson of disposition via secure messaging.    Pt appears nervous but has good eye contact.  Patient is oriented x4.  He is not responding to internal stimuli nor does he show signs of delusional thought process.  Pt can speak clearly and coherently.  He hs poor insight.  He says he is up and down at night an dthe his appetite varies.    Pt has had two previous inapte9nt care experiences but they were years ago.  He goes to [facility].     Chief Complaint:  Chief Complaint  Patient presents with   Psychiatric Evaluation   Visit Diagnosis: Generalized Anxiety D/O    CCA Screening, Triage and Referral (STR)  Patient Reported Information How did you hear about [place]? Self  What Is the Reason for Your Visit/Call Today? Pt drove himself to [facility].  He says he has been goign down into a deep depression.  He felt that he needed to go somewhere to get help.  Pt says "I wish I could go to sleep and not wake up."  Pt says he has no intention to kill himself.  He still says "I wish I could go away and not have to deal with the world."  He says he has had a hx of nervous breakdowns and has been on disability since 2001.  Pt has attempted suicide, 1996 he took overdose of Tylenol.  The 2nd suicide attempt was in christmas of 2023.  This week he looked up on the internet whether his current medications would kill him.  Pt denies any self harm.  Pt denies any HI or A/V hallucinations.  Pt denies regular use of ETOH or other substances.  Pt denies any access to guns or weapons.  Pt sees Dr. Rica Wilson and Richard Libman, PA at [place], last appt was 03/17/22.  Pt has a c-pap but he does not use it because he does not know how.  Pt is up and down at  night and catches naps when he can.  Appetite varies.  He has had two past psychiatric hospitalizations.  How Long Has This Been Causing You Problems? 1 wk - 1 month  What Do You Feel Would Help You the Most Today? Treatment for Depression or other mood problem   Have You Recently Had Any Thoughts About Hurting Yourself? Yes  Are You Planning to Commit Suicide/Harm Yourself At This time? No   Flowsheet Row ED from 03/18/2022 in [facility] Department at [facility] ED from 11/02/2021 in [facility] ED from 10/25/2021 in [facility] at [place] No Risk No Risk No Risk       Have you Recently Had Thoughts About [place]? No  Are You Planning to Harm Someone at This Time? No  Explanation: No data recorded  Have You Used Any Alcohol or Drugs in the Past 24 Hours? No  What Did You Use and How Much? No data recorded  Do You Currently Have a Therapist/Psychiatrist? No data recorded Name of Therapist/Psychiatrist: Name of Therapist/Psychiatrist: Dr. Rica Wilson and Richard Libman, PA at [place] Recently Discharged From Any Office Practice or Programs? No  Explanation of Discharge From Practice/Program: No  data recorded    CCA Screening Triage Referral Assessment Type of Contact: No data recorded Telemedicine Service Delivery:   Is this Initial or Reassessment?   Date Telepsych consult ordered in [facility]:    Time Telepsych consult ordered in [facility]:    Location of Assessment: No data recorded Provider Location: No data recorded  Collateral Involvement: No data recorded  Does Patient Have a [place]? No data recorded Legal Guardian Contact Information: No data recorded Copy of Legal Guardianship Form: No data recorded Legal Guardian Notified of Arrival: No data recorded Legal Guardian Notified of Pending Discharge: No data  recorded If Minor and Not Living with Parent(s), Who has Custody? No data recorded Is CPS involved or ever been involved? No data recorded Is APS involved or ever been involved? No data recorded  Patient Determined To Be At Risk for Harm To Self or Others Based on Review of Patient Reported Information or Presenting Complaint? No data recorded Method: No data recorded Availability of Means: No data recorded Intent: No data recorded Notification Required: No data recorded Additional Information for Danger to Others Potential: No data recorded Additional Comments for Danger to Others Potential: No data recorded Are There Guns or Other Weapons in Your Home? No data recorded Types of Guns/Weapons: No data recorded Are These Weapons Safely Secured?                            No data recorded Who Could Verify You Are Able To Have These Secured: No data recorded Do You Have any Outstanding Charges, Pending Court Dates, Parole/Probation? No data recorded Contacted To Inform of Risk of Harm To Self or Others: No data recorded   Does Patient Present under Involuntary Commitment? No data recorded   [place] of Residence: No data recorded  Patient Currently Receiving the Following Services: No data recorded  Determination of Need: No data recorded  Options For Referral: No data recorded    CCA Biopsychosocial Patient Reported Schizophrenia/Schizoaffective Diagnosis in Past: No   Strengths: Pt is knowledgeable about silver flatware and costume jewelery   Mental Health Symptoms Depression:   Change in energy/activity; Fatigue; Hopelessness; Worthlessness; Tearfulness; Increase/decrease in appetite; Sleep (too much or little)   Duration of Depressive symptoms:  Duration of Depressive Symptoms: Greater than two weeks   Mania:   None   Anxiety:    Restlessness; Difficulty concentrating; Worrying; Tension   Psychosis:   None   Duration of Psychotic symptoms:    Trauma:   Avoids  reminders of event   Obsessions:   Attempts to suppress/neutralize   Compulsions:   "Driven" to perform behaviors/acts (Looking at gay porn)   Inattention:   None   Hyperactivity/Impulsivity:   None   Oppositional/Defiant Behaviors:   None   Emotional Irregularity:   Chronic feelings of emptiness   Other Mood/Personality Symptoms:   None    Mental Status Exam Appearance and self-care  Stature:   Average   Weight:   Average weight   Clothing:   Casual (Scrubs)   Grooming:   Normal   Cosmetic use:   Excessive   Posture/gait:   Normal   Motor activity:   Not Remarkable   Sensorium  Attention:   Normal   Concentration:   Anxiety interferes   Orientation:   X5   Recall/memory:   Normal   Affect and Mood  Affect:   Appropriate   Mood:   Anxious;  Depressed   Relating  Eye contact:   Normal   Facial expression:   Anxious   Attitude toward examiner:   Guarded   Thought and Language  Speech flow:  Clear and Coherent   Thought content:   Appropriate to Mood and Circumstances   Preoccupation:   Obsessions   Hallucinations:   Auditory; Visual   Organization:   Insurance account manager of Knowledge:   Average   Intelligence:   Average   Abstraction:   Functional   Judgement:   Poor   Reality Testing:   Distorted   Insight:   Fair   Decision Making:   Impulsive   Social Functioning  Social Maturity:   Impulsive   Social Judgement:   Heedless   Stress  Stressors:   School; Family conflict   Coping Ability:   Exhausted; Overwhelmed   Skill Deficits:   Responsibility   Supports:   Friends/Service system     Religion: Religion/Spirituality Are You A Religious Person?: Yes What is Your Religious Affiliation?: Episcopalian How Might This Affect Treatment?: None  Leisure/Recreation: Leisure / Recreation Do You Have Hobbies?: Yes Leisure and Hobbies: Reading and studying  flatware  Exercise/Diet: Exercise/Diet Do You Exercise?: No Have You Gained or Lost A Significant Amount of Weight in the Past Six Months?: Yes-Gained Number of Pounds Gained:  (30 lbs in last two years) Do You Follow a Special Diet?: No Do You Have Any Trouble Sleeping?: Yes Explanation of Sleeping Difficulties: Cannot wear c-pap, up and down at night.   CCA Employment/Education Employment/Work Situation: Employment / Work Technical sales engineer: On disability Why is Patient on Disability: Mental health How Long has Patient Been on Disability: from 2001was on disability Patient's Job has Been Impacted by Current Illness: No Has Patient ever Been in the [employer]?: No  Education: Education Is Patient Currently Attending School?: No Last Grade Completed: 18 Did You Attend College?: Yes What Type of College Degree Do you Have?: Has a masters in [employer] Did You Have An Individualized Education Program (IIEP): No Did You Have Any Difficulty At School?: No Patient's Education Has Been Impacted by Current Illness: No   CCA Family/Childhood History Family and Relationship History: Family history Marital status: Single Does patient have children?: No  Childhood History:  Childhood History By whom was/is the patient raised?: Both parents Did patient suffer any verbal/emotional/physical/sexual abuse as a child?: No Did patient suffer from severe childhood neglect?: No Has patient ever been sexually abused/assaulted/raped as an adolescent or adult?: No Was the patient ever a victim of a crime or a disaster?: No Witnessed domestic violence?: No Has patient been affected by domestic violence as an adult?: No       CCA Substance Use Alcohol/Drug Use: Alcohol / Drug Use Pain Medications: See PTA medication list Prescriptions: See PTA medication list Over the Counter: See PTA medication list History of alcohol / drug use?: No history of alcohol / drug abuse                          ASAM's:  Six Dimensions of Multidimensional Assessment  Dimension 1:  Acute Intoxication and/or Withdrawal Potential:      Dimension 2:  Biomedical Conditions and Complications:      Dimension 3:  Emotional, Behavioral, or Cognitive Conditions and Complications:     Dimension 4:  Readiness to Change:     Dimension 5:  Relapse, Continued use, or  Continued Problem Potential:     Dimension 6:  Recovery/Living Environment:     ASAM Severity Wilson:    ASAM Recommended Level of Treatment:     Substance use Disorder (SUD)    Recommendations for Services/Supports/Treatments: Recommendations for Services/Supports/Treatments Recommendations For Services/Supports/Treatments: Inpatient Hospitalization  Discharge Disposition:    DSM5 Diagnoses: 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   Mild cognitive impairment of uncertain or unknown etiology 01/21/2021   Chronic idiopathic constipation 10/23/2020   Erectile dysfunction 10/23/2020   Hyperglycemia 09/24/2020   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   Thrombocytopenia 04/08/2020   Generalized anxiety disorder 01/01/2020   Major depressive disorder 01/01/2020   Primary osteoarthritis of both hips 07/30/2019   Chronic kidney disease, stage 3a 03/19/2019   Hyperlipidemia 12/15/2018   Arthritis of knee 08/31/2017   Nephrolithiasis 06/22/2016   Obstructive sleep apnea 04/13/2015   Diverticulosis of large intestine without hemorrhage 09/30/2013   Mild mitral regurgitation 0000000   Diastolic dysfunction 0000000   Elevated PSA 12/22/2012   RBBB (right bundle branch block) 12/03/2012     Referrals to Alternative Service(s): Referred to Alternative Service(s):   Place:   Date:   Time:    Referred to Alternative Service(s):   Place:   Date:   Time:    Referred to  Alternative Service(s):   Place:   Date:   Time:    Referred to Alternative Service(s):   Place:   Date:   Time:     Waldron Session

## 2022-03-19 NOTE — ED Provider Notes (Addendum)
Pt accepted to [facility], Dr Mina Marble.  Verified w staff that pt has been accepted, and that report has been called.   Pt alert, content, calm, no distress. Vitals normal.   Pt currently appears stable for transfer/transport.       Lajean Saver, MD 03/19/22 1026

## 2022-03-19 NOTE — Discharge Summary (Signed)
[facility] Psych ED Discharge  03/19/2022 2:52 PM Richard Wilson  MRN:  [id number]  Principal Problem: Major depressive disorder, recurrent, severe without psychotic features [facility]) Discharge Diagnoses: Principal Problem:   Major depressive disorder, recurrent, severe without psychotic features ([place])  Clinical Impression:  Final diagnoses:  Depression, unspecified depression type   Subjective: Caucasian male, 72 years old with hx of Depression and anxiety came to the ER last night for severe depression after his CPAP Mask was not sent to him.  Patient is being treated at [facility] and is getting therapy weekly at the same clinic.  Patient reports that he was frustrated yesterday and decided to come in.  He was seen at the Psychiatric clinic on Wednesday.  Patient declined admission at [facility] in [place] stating his depression is not as bad as it was before.  Patient reports that he is going home and that he will see his Psychiatrist next week.  Patient admits to two previous suicide attempts and last was last December where he took some pills.  He vehemently denied feeling suicidal today.  Patient reviewed safety plan with provider stating he want to go home and take his medications.  Provider called [facility] group's office and spoke to Medical assistance Bonney Leitz regarding Patients visit and need for early appointment.  She reports that patient will be seen on Monday.  She will reach out to patient to come for Monday appointment.  Discussion regarding increase in Therapy was agreed upon. Patient is calm and cooperative.  He denies feeling suicidal or having suicidal thought.  He is engaged in Psychiatry care and counseling.  We reviewed safety plan-call 911 or 988 for mental health crisis including but not limited to suicide ideation.  Patient is also encouraged to go to the nearest ER if in crisis after hours or go to [place].  Patient is  Psychiatrically cleared.   ED Assessment Time Calculation: Start Time: 1429 Stop Time: 1450 Total Time in Minutes (Assessment Completion): 21   Past Psychiatric History: Depression anxiety self reported suicide attempt twice.  Receives care at [facility] group.  Past Medical History:  Past Medical History:  Diagnosis Date   Arthritis of knee 08/31/2017   Chronic idiopathic constipation 10/23/2020   Chronic kidney disease, stage 3a [id number]   Diastolic dysfunction 0000000   Diverticulosis of large intestine without hemorrhage 09/30/2013   Elevated PSA 12/22/2012   Erectile dysfunction 10/23/2020   Generalized anxiety disorder 01/01/2020   Hyperglycemia 09/24/2020   Intention tremor    Major depressive disorder 01/01/2020   Mild cognitive impairment of uncertain or unknown etiology 01/21/2021   Mild mitral regurgitation 08/15/2013   Nephrolithiasis 06/22/2016   Obstructive sleep apnea 04/13/2015   Orthostatic lightheadedness 10/23/2020   Primary osteoarthritis of both hips 07/30/2019   Pure hypercholesterolemia 12/15/2018   RBBB (right bundle branch block) 12/03/2012   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   Thrombocytopenia 04/08/2020    Past Surgical History:  Procedure Laterality Date   APPENDECTOMY     FOOT TENDON SURGERY Right    TONSILLECTOMY     Family History:  Family History  Problem Relation Age of Onset   Cancer Mother        Ovarian   Heart disease Mother    Memory loss Mother        with advanced age   Prostate cancer Father    Throat cancer Maternal Grandfather    Heart attack Paternal Grandfather  Cancer Maternal Aunt        Breast, metastatic   Family Psychiatric  History: unknown Social History:  Social History   Substance and Sexual Activity  Alcohol Use Not Currently   Comment: occasional glass of wine     Social History   Substance and Sexual Activity  Drug Use Never    Social History   Socioeconomic History    Marital status: Single    Spouse name: Not on file   Number of children: 0   Years of education: 18   Highest education level: Master's degree (e.g., MA, MS, MEng, MEd, MSW, MBA)  Occupational History   Occupation: Disability    Comment: 2001 for GAD   Occupation: Licensed conveyancer   Occupation: Retired    Comment: Former Art therapist  Tobacco Use   Smoking status: Never   Smokeless tobacco: Never  Vaping Use   Vaping Use: Never used  Substance and Sexual Activity   Alcohol use: Not Currently    Comment: occasional glass of wine   Drug use: Never   Sexual activity: Not Currently  Other Topics Concern   Not on file  Social History Narrative   Grew up in [place]. Had a good childhood. Never abused.    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    Never married. No kids. Is gay.      No legal trouble.   [place]   RIGHT HANDED   Social Determinants of Health   Financial Resource Strain: Low Risk  (11/10/2021)   Overall Financial Resource Strain (CARDIA)    Difficulty of Paying Living Expenses: Not very hard  Food Insecurity: No Food Insecurity (11/10/2021)   Hunger Vital Sign    Worried About Running Out of Food in the Last Year: Never true    Ran Out of Food in the Last Year: Never true  Transportation Needs: No Transportation Needs (11/10/2021)   PRAPARE - Hydrologist (Medical): No    Lack of Transportation (Non-Medical): No  Physical Activity: Inactive (11/10/2021)   Exercise Vital Sign    Days of Exercise per Week: 0 days    Minutes of Exercise per Session: 0 min  Stress: Stress Concern Present (11/10/2021)   [place]    Feeling of Stress : To some extent  Social Connections: Socially Isolated (11/10/2021)   Social Connection and Isolation Panel [NHANES]    Frequency of Communication with Friends  and Family: Once a week    Frequency of Social Gatherings with Friends and Family: Never    Attends Religious Services: More than 4 times per year    Active Member of [employer] or Organizations: No    Attends Archivist Meetings: Never    Marital Status: Never married    Tobacco Cessation:  N/A, patient does not currently use tobacco products  Current Medications: Current Facility-Administered Medications  Medication Dose Route Frequency Provider Last Rate Last Admin   [START ON 03/20/2022] acidophilus (RISAQUAD) capsule 1 capsule  1 capsule Oral Daily Sherwood Gambler, MD       [START ON 03/20/2022] B-complex with vitamin C tablet 1 tablet  1 tablet Oral Daily Sherwood Gambler, MD       [START ON 03/20/2022] cholecalciferol (VITAMIN D3) 25 MCG (1000 UNIT) tablet 2,000 Units  2,000 Units Oral Daily Sherwood Gambler, MD  clonazePAM (KLONOPIN) tablet 0.5 mg  0.5 mg Oral QHS Sherwood Gambler, MD       clonazePAM [facility]) tablet 0.5 mg  0.5 mg Oral Q1400 Sherwood Gambler, MD       clonazePAM [facility]) tablet 1 mg  1 mg Oral Daily Sherwood Gambler, MD   1 mg at 03/19/22 0931   [START ON 03/20/2022] cyanocobalamin (VITAMIN B12) tablet 1,000 mcg  1,000 mcg Oral Daily Sherwood Gambler, MD       fluvoxaMINE (LUVOX) tablet 200 mg  200 mg Oral QHS Sherwood Gambler, MD       folic acid (FOLVITE) tablet 1 mg  1 mg Oral Daily Sherwood Gambler, MD   1 mg at 03/19/22 [id number]   lamoTRIgine (LAMICTAL) tablet 150 mg  150 mg Oral Daily Sherwood Gambler, MD   150 mg at 03/19/22 [id number]   lithium carbonate capsule 150 mg  150 mg Oral BID WC Sherwood Gambler, MD   150 mg at 03/19/22 0929   melatonin tablet 3 mg  3 mg Oral QHS Palumbo, April, MD   3 mg at 03/19/22 [id number]   pantoprazole (PROTONIX) EC tablet 40 mg  40 mg Oral Daily Sherwood Gambler, MD   40 mg at 03/19/22 [id number]   thiamine (VITAMIN B1) tablet 100 mg  100 mg Oral Daily Sherwood Gambler, MD   100 mg at 03/19/22 [id number]   Current Outpatient Medications  Medication  Sig Dispense Refill   amitriptyline (ELAVIL) 25 MG tablet Take 25 mg by mouth daily.     clonazePAM (KLONOPIN) 1 MG tablet TAKE 1 TABLET BY MOUTH IN THE MORNING, 1/2 TABLET MIDDAY, 1/2 TABLET AT BEDTIME. (Patient taking differently: Take 0.5-1 mg by mouth See admin instructions. TAKE 1 TABLET BY MOUTH IN THE MORNING, 1/2 TABLET MIDDAY, 1/2 TABLET AT BEDTIME.) 60 tablet 1   fluvoxaMINE (LUVOX) 100 MG tablet Take 2 tablets (200 mg total) by mouth at bedtime. (Patient taking differently: Take 100 mg by mouth at bedtime.) 60 tablet 2   folic acid (FOLVITE) 1 MG tablet TAKE 1 TABLET BY MOUTH EVERY DAY 90 tablet 1   hydrocortisone 2.5 % cream Apply 1 Application topically as needed (itching).     lamoTRIgine (LAMICTAL) 150 MG tablet TAKE 1 TABLET BY MOUTH EVERY DAY 90 tablet 0   lithium carbonate 150 MG capsule TAKE 1 CAPSULE (150 MG TOTAL) BY MOUTH 2 (TWO) TIMES DAILY WITH A MEAL. 180 capsule 0   Probiotic Product (DAILY PROBIOTIC PO) Take 1 capsule by mouth daily.     thiamine (VITAMIN B1) 100 MG tablet Take 1 tablet (100 mg total) by mouth daily. 30 tablet 1   B Complex Vitamins (VITAMIN B COMPLEX) TABS Take 1 tablet by mouth daily. (Patient not taking: Reported on 03/19/2022)     Cholecalciferol (VITAMIN D3) 50 MCG (2000 UT) TABS Take 1 tablet by mouth daily. (Patient not taking: Reported on 03/19/2022) 90 tablet 0   omeprazole (PRILOSEC) 20 MG capsule Take 20 mg by mouth daily. prn (Patient not taking: Reported on 03/19/2022)     vitamin B-12 (CYANOCOBALAMIN) 1000 MCG tablet Take 1,000 mcg by mouth daily. (Patient not taking: Reported on 03/19/2022)     PTA Medications: (Not in a hospital admission)   [place] Scale:  [place] ED from 03/18/2022 in [facility] Department at [facility] ED from 11/02/2021 in [facility] ED from 10/25/2021 in [facility] [place] No Risk  No Risk No Risk        Musculoskeletal: Strength & Muscle Tone: within normal limits Gait & Station: normal Patient leans: Front  Psychiatric Specialty Exam: Presentation  General Appearance:  Casual; Fairly Groomed  Eye Contact: Good  Speech: Clear and Coherent; Normal Rate  Speech Volume: Normal  Handedness: Right   Mood and Affect  Mood: Depressed  Affect: Congruent; Depressed   Thought Process  Thought Processes: Coherent; Goal Directed; Linear  Descriptions of Associations:Intact  Orientation:Full (Time, Place and Person)  Thought Content:Logical  History of Schizophrenia/Schizoaffective disorder:No  Duration of Psychotic Symptoms:No data recorded Hallucinations:Hallucinations: None  Ideas of Reference:None  Suicidal Thoughts:Suicidal Thoughts: No  Homicidal Thoughts:Homicidal Thoughts: No   Sensorium  Memory: Immediate Good; Recent Good; Remote Good  Judgment: Good  Insight: Good   Executive Functions  Concentration: Good  Attention Span: Good  Recall: Good  Fund of Knowledge: Good  Language: Good   Psychomotor Activity  Psychomotor Activity: Psychomotor Activity: Normal   Assets  Assets: Communication Skills; Desire for Improvement; Housing; Catering manager; Physical Health   Sleep  Sleep: Sleep: Fair    Physical Exam: Physical Exam Vitals and nursing note reviewed.  Constitutional:      Appearance: Normal appearance.  HENT:     Head: Normocephalic and atraumatic.     Nose: Nose normal.  Cardiovascular:     Rate and Rhythm: Normal rate and regular rhythm.  Pulmonary:     Effort: Pulmonary effort is normal.  Musculoskeletal:        General: Normal range of motion.     Cervical back: Normal range of motion.  Skin:    General: Skin is warm and dry.  Neurological:     Mental Status: He is alert and oriented to person, place, and time.    Review of Systems  Constitutional: Negative.   HENT:  Negative.    Eyes: Negative.   Respiratory: Negative.    Cardiovascular: Negative.   Gastrointestinal: Negative.   Musculoskeletal: Negative.   Skin: Negative.   Endo/Heme/Allergies: Negative.   Psychiatric/Behavioral:  Positive for depression. The patient is nervous/anxious.    Blood pressure (!) 136/90, pulse 65, temperature 98.1 F (36.7 C), temperature source Oral, resp. rate 17, SpO2 100 %. There is no height or weight on file to calculate BMI.   Demographic Factors:  Male, Age 78 or older, Caucasian, Living alone, and Unemployed  Loss Factors: NA  Historical Factors: Prior suicide attempts  Risk Reduction Factors:   Positive social support  Continued Clinical Symptoms:  Depression:   Impulsivity  Cognitive Features That Contribute To Risk:  Polarized thinking    Suicide Risk:  Minimal: No identifiable suicidal ideation.  Patients presenting with no risk factors but with morbid ruminations; may be classified as minimal risk based on the severity of the depressive symptoms    Plan Of Care/Follow-up recommendations:  Activity:  as tolerated Diet:  Regular  Medical Decision Making: Patient vehemently denied feeling suicidal at this time.  He plans to see his Psychiatrist on Monday and staff at [address] has agreed to make appointment for patient on Monday.  Patient will continue to take his medications and will be seen on Monday.  He is Psychiatrically cleared.  Disposition: Transfer via Transport-Psychiatrically cleared  Delfin Gant, NP-PMHNP-BC 03/19/2022, 2:52 PM

## 2022-03-19 NOTE — Discharge Instructions (Addendum)
Please take your medicines as prescribed   See your psychiatrist for follow-up  Return to ER if you have worse depression, thoughts of harming yourself or others, hallucinations

## 2022-03-22 ENCOUNTER — Other Ambulatory Visit: Payer: Self-pay | Admitting: Physician Assistant

## 2022-03-22 NOTE — Telephone Encounter (Signed)
Has an appt with Teresa tomorrow.  

## 2022-03-23 ENCOUNTER — Telehealth: Payer: Self-pay | Admitting: Family Medicine

## 2022-03-23 ENCOUNTER — Telehealth: Payer: Self-pay

## 2022-03-23 ENCOUNTER — Ambulatory Visit (INDEPENDENT_AMBULATORY_CARE_PROVIDER_SITE_OTHER): Payer: Medicare Other | Admitting: Physician Assistant

## 2022-03-23 ENCOUNTER — Encounter: Payer: Self-pay | Admitting: Physician Assistant

## 2022-03-23 DIAGNOSIS — F411 Generalized anxiety disorder: Secondary | ICD-10-CM

## 2022-03-23 DIAGNOSIS — G3184 Mild cognitive impairment, so stated: Secondary | ICD-10-CM

## 2022-03-23 DIAGNOSIS — F331 Major depressive disorder, recurrent, moderate: Secondary | ICD-10-CM

## 2022-03-23 DIAGNOSIS — G4733 Obstructive sleep apnea (adult) (pediatric): Secondary | ICD-10-CM

## 2022-03-23 DIAGNOSIS — Z91148 Patient's other noncompliance with medication regimen for other reason: Secondary | ICD-10-CM

## 2022-03-23 MED ORDER — FLUVOXAMINE MALEATE 50 MG PO TABS: 50.0000 mg | ORAL_TABLET | Freq: Every day | ORAL | 0 refills | Status: DC

## 2022-03-23 NOTE — Telephone Encounter (Signed)
     Patient  visit on 2/6  at [place] you been able to follow up with your primary care physician? YES   The patient was or was not able to obtain any needed medicine or equipment. YES   Are there diet recommendations that you are having difficulty following? NA   Patient expresses understanding of discharge instructions and education provided has no other needs at this time.  [place], [place] [phone] [address], [place], [place] [address] Phone: [phone] Email: [email or web address]

## 2022-03-23 NOTE — Telephone Encounter (Signed)
Called pt and advised, he said ok ok.  Thank you for calling.

## 2022-03-23 NOTE — Telephone Encounter (Signed)
Pt left voice mail that he would like to transfer his pyschiatric medicine to Dr Redmond School  [phone]

## 2022-03-23 NOTE — Progress Notes (Signed)
[facility] Med Check  Patient ID: Richard Wilson,  MRN: [id number]  PCP: Denita Lung, MD  Date of Evaluation: 03/23/2022 Tme spent:30 minutes  Chief Complaint:  Chief Complaint   Depression    HISTORY/CURRENT STATUS: HPI For routine med check.    He went to ER at [facility] on 03/18/2022. States he was not suicidal, just lonely and didn't have anyone to talk to.  They initially recommended that he go to [facility], he refused, and told psychiatry that he was not suicidal so they let him go home. Denies being suicidal now. Says he doesn't have any friends and no one to chat with or anything. He did not contact the [facility] as recommended by Dr. [name], info was given to pt on AVS at my last appt w/ him on 03/17/2022. Pt not sure why he didn't call them.   Doesn't have much energy and not motivated at all. When he does take the initiative to go to church or whatever, feels slighted, no one talks to him, feels ostracized. ADLs are ok. Apparently handling finances a little better. He and Dr. [name] have talked about bill paying, due dates etc. Sleep is 'ok.'  Unclear whether he's trying to get CPAP fitting appt, he blew off the question. Personal hygiene is nl. Appetite fair. Feels hopeless "what is there to live for? I'm not going to kill myself, but I don't care if I die.' No change in memory. No homicidal thoughts.  Anxiety is about the same. No PA, gets overwhelmed though. He's tolerated the current dose of Klonopin for several weeks now, when at one point last year, he reported inability to tolerate even 0.125 mg drop in dose.   Patient denies increased energy with decreased need for sleep, increased talkativeness, racing thoughts, impulsivity or risky behaviors, increased spending, increased libido, grandiosity, increased irritability or anger, paranoia, or hallucinations.  Review of Systems  Constitutional:  Positive for malaise/fatigue.  HENT: Negative.    Eyes:  Negative.   Respiratory: Negative.    Cardiovascular: Negative.   Gastrointestinal: Negative.   Genitourinary: Negative.   Musculoskeletal: Negative.   Skin: Negative.   Neurological: Negative.   Endo/Heme/Allergies: Negative.   Psychiatric/Behavioral:         See HPI   Individual Medical History/ Review of Systems: Changes? :Yes    see HPI and ER note  Past medications for mental health diagnoses include: Rozerem, Trazodone caused vivid dreams, Xanax, Buspar, mirtazapine caused excessive somnolence and fatigue with flulike symptoms, Belsomra, propranolol for tremor, Zoloft, Abilify caused tremor, Klonopin, Effexor, Ativan, Wellbutrin, Nardil, gabapentin, Librium, Luvox, Amitriptyline   Pertinent info after reviewing Dr. Evelena Leyden notes, his previous psychiatrist in [facility]. Past suicide attempt in September 1996, by taking Tylenol.  He was admitted at that time to a psychiatric hospital in [facility].  Admitted to psych hospital multiple times in the past for "nervous breakdowns."  January 1985, February 1986, October 1991, September 1996, July 2000.  Allergies: Gentamicin and Penicillins  Current Medications:  Current Outpatient Medications:    clonazePAM (KLONOPIN) 1 MG tablet, TAKE 1 TABLET BY MOUTH IN THE MORNING, 1/2 TABLET MIDDAY, 1/2 TABLET AT BEDTIME. (Patient taking differently: Take 0.5-1 mg by mouth See admin instructions. TAKE 1 TABLET BY MOUTH IN THE MORNING, 1/2 TABLET MIDDAY, 1/2 TABLET AT BEDTIME.), Disp: 60 tablet, Rfl: 1   folic acid (FOLVITE) 1 MG tablet, TAKE 1 TABLET BY MOUTH EVERY DAY, Disp: 90 tablet, Rfl: 1  lamoTRIgine (LAMICTAL) 150 MG tablet, TAKE 1 TABLET BY MOUTH EVERY DAY, Disp: 90 tablet, Rfl: 0   lithium carbonate 150 MG capsule, TAKE 1 CAPSULE (150 MG TOTAL) BY MOUTH 2 (TWO) TIMES DAILY WITH A MEAL., Disp: 180 capsule, Rfl: 0   omeprazole (PRILOSEC) 20 MG capsule, Take 20 mg by mouth daily. prn, Disp: , Rfl:     Probiotic Product (DAILY PROBIOTIC PO), Take 1 capsule by mouth daily., Disp: , Rfl:    thiamine (VITAMIN B-1) 100 MG tablet, Take 100 mg by mouth daily., Disp: , Rfl:    amitriptyline (ELAVIL) 25 MG tablet, Take 25 mg by mouth daily. (Patient not taking: Reported on 03/23/2022), Disp: , Rfl:    B Complex Vitamins (VITAMIN B COMPLEX) TABS, Take 1 tablet by mouth daily. (Patient not taking: Reported on 03/19/2022), Disp: , Rfl:    Cholecalciferol (VITAMIN D3) 50 MCG (2000 UT) TABS, Take 1 tablet by mouth daily. (Patient not taking: Reported on 03/19/2022), Disp: 90 tablet, Rfl: 0   fluvoxaMINE (LUVOX) 100 MG tablet, Take 2 tablets (200 mg total) by mouth at bedtime. (Patient not taking: Reported on 03/23/2022), Disp: 60 tablet, Rfl: 2   fluvoxaMINE (LUVOX) 50 MG tablet, TAKE 1 TABLET BY MOUTH EVERYDAY AT BEDTIME, Disp: 30 tablet, Rfl: 0   hydrocortisone 2.5 % cream, Apply 1 Application topically as needed (itching). (Patient not taking: Reported on 03/23/2022), Disp: , Rfl:    thiamine (VITAMIN B1) 100 MG tablet, TAKE 1 TABLET BY MOUTH EVERY DAY, Disp: 100 tablet, Rfl: 0   vitamin B-12 (CYANOCOBALAMIN) 1000 MCG tablet, Take 1,000 mcg by mouth daily. (Patient not taking: Reported on 03/19/2022), Disp: , Rfl:  Medication Side Effects: sexual dysfunction   Family Medical/ Social History: Changes?  no  MENTAL HEALTH EXAM:  There were no vitals taken for this visit.There is no height or weight on file to calculate BMI.  General Appearance: Casual and Well Groomed  Eye Contact:  Good  Speech:  Clear and Coherent, Normal Rate, and Talkative  Volume:  Normal  Mood:  Euthymic  Affect:  Congruent  Thought Process:  Goal Directed and Descriptions of Associations: Circumstantial  Orientation:  Full (Time, Place, and Person)  Thought Content: Logical   Suicidal Thoughts:  No  Homicidal Thoughts:  No  Memory:  WNL  Judgement:  Good  Insight:  Good  Psychomotor Activity:  Normal and no tremor   Concentration:  Concentration: Fair and Attention Span: Fair  Recall:  Good  Fund of Knowledge: Good  Language: Good  Assets:  Desire for Improvement Financial Resources/Insurance Housing Transportation  ADL's:  Intact  Cognition: WNL  Prognosis:  Good   Labs from ER 03/19/2022 reviewed.  Gene site test results are on chart under media. See neuro psych test results on chart.[name] [name] 01/21/2020  Note from neuro, Sharene Butters, PA-C from 07/21/2021.   DIAGNOSES:    ICD-10-CM   1. Major depressive disorder, recurrent episode, moderate (HCC)  F33.1     2. Generalized anxiety disorder  F41.1     3. Mild cognitive impairment of uncertain or unknown etiology -- suspect combination of nutritional deficiency, anxiety, sedative effects, and dyssomnia with possible apnea  G31.84     4. Obstructive sleep apnea  G47.33     5. Variable compliance with medication and medical advice  Z91.148       Receiving Psychotherapy: Yes    Dr. Jonni Sanger [place].   RECOMMENDATIONS:  PDMP was reviewed.  Klonopin  last filled 03/08/2022. I provided 30 minutes of face to face time during this encounter, including time spent before and after the visit in records review, medical decision making, counseling pertinent to today's visit, and charting.   Contract for safety in place. Call the office on-call service, 988/hotline, 911, or present to [facility] or ER if any life-threatening psychiatric crisis. Patient verbalizes understanding.   He brought his meds today, in original bottles. Same as last week, Luvox was not included. He swore to me he was taking it at that time. Now he says he didn't know anything about it, and if it's not in that bag, he's not on it. He has no idea when he stopped taking it. I explained its' purpose, will help w/ depression, anxiety, OCD behaviors, sleep. He's willing to get back on it.   I won't try to wean Klonopin any further today, hopefully to prevent further  confusion, which happens frequently and easily. Multifactorial I think, depression, anxiety, OSA, chronic benzo use, age, cognitive impairment, stress, apathy.   Continue Klonopin 1 mg by continuing 1 p.o. every morning, 1/2 pill midday, and continue evening dose,1/2 pill qhs.  Restart Luvox 50 mg, 1 qhs. (Will ramp up gradually) Continue Lamictal 150 mg  Continue lithium 150 mg, 1 p.o. twice daily. (For SI and dep) Get back on multivitamin, B complex, folic acid, thiamine, and B12. Continue therapy with Dr. [name]. Stressed importance of calling [employer]. Ph# given again.  Return in 2 weeks.  Donnal Moat, PA-C

## 2022-03-24 ENCOUNTER — Ambulatory Visit (INDEPENDENT_AMBULATORY_CARE_PROVIDER_SITE_OTHER): Payer: Medicare Other | Admitting: Psychiatry

## 2022-03-24 ENCOUNTER — Other Ambulatory Visit: Payer: Self-pay | Admitting: Medical

## 2022-03-24 DIAGNOSIS — G4733 Obstructive sleep apnea (adult) (pediatric): Secondary | ICD-10-CM | POA: Diagnosis not present

## 2022-03-24 DIAGNOSIS — F428 Other obsessive-compulsive disorder: Secondary | ICD-10-CM

## 2022-03-24 DIAGNOSIS — F5105 Insomnia due to other mental disorder: Secondary | ICD-10-CM

## 2022-03-24 DIAGNOSIS — F341 Dysthymic disorder: Secondary | ICD-10-CM

## 2022-03-24 DIAGNOSIS — F99 Mental disorder, not otherwise specified: Secondary | ICD-10-CM

## 2022-03-24 DIAGNOSIS — Z1589 Genetic susceptibility to other disease: Secondary | ICD-10-CM

## 2022-03-24 DIAGNOSIS — F604 Histrionic personality disorder: Secondary | ICD-10-CM

## 2022-03-24 DIAGNOSIS — F331 Major depressive disorder, recurrent, moderate: Secondary | ICD-10-CM

## 2022-03-24 DIAGNOSIS — F411 Generalized anxiety disorder: Secondary | ICD-10-CM

## 2022-03-24 DIAGNOSIS — Z91148 Patient's other noncompliance with medication regimen for other reason: Secondary | ICD-10-CM

## 2022-03-24 DIAGNOSIS — F4010 Social phobia, unspecified: Secondary | ICD-10-CM

## 2022-03-24 DIAGNOSIS — G3184 Mild cognitive impairment, so stated: Secondary | ICD-10-CM | POA: Diagnosis not present

## 2022-03-24 NOTE — Progress Notes (Signed)
Psychotherapy Progress Note [facility] Richard Moore, PhD LP  Patient ID: Richard Wilson [facility] "Richard Wilson")    MRN: [id number] Therapy format: Individual psychotherapy Date: 03/24/2022      Start: 9:12a     Stop: 10:02a     Time Spent: 50 min Location: In-person   Session narrative (presenting needs, interim history, self-report of stressors and symptoms, applications of prior therapy, status changes, and interventions made in session) Med check yesterday.  Followed another ER presentation last week and telephone petition to his PCP yesterday to take over psychiatric med management (refused).  Per psychiatry note, he was not actually suicidal, just willing to present that way to break loneliness and despair, effectively turning the ER into an urgent, and potentially very expensive, bid for supportive care and concern.  Klonopin dosing reinforced at 1 / 0.5 / 0.5 schedule morning / midday / evening.  Luvox consolidated to both pills QHS, continue Lamictal, lithium, and full slate of vitamins (B complex + folic acid, X41, B1).  2-4 wk f/u.   In person, says he is feeling a good deal better today.  Reckons he made a mistake leaving a Rx unfilled for several days (Luvox).  Confirmed that going into abrupt withdrawal from his antidepressant would certainly pitch him into anxiety, depression, and a free-floating sense of urgency that would magnify all troubles.  Made agreement that any time he experiences abrupt distress, we should think medication adherence first and check -- it will spare agonizing, shame, anger, and ultimately expense if we address the most likely culprit first (physiological dysregulation).  C/o harsh treatment by the psych NP at the ER, when he refused shipment to [facility] for inpt treatment.  Felt he had been misunderstood, acted upon without his consent, then sniped at for not complying.  Support provided, though it is equally possible both that he is dramatizing  his experience there and that dramatizing himself while there triggered countertransference in the assessment counselor.  Assured him that any mental health clinician worth their salt would not take it personally, and he was allowed to leave on his own initiative precisely because they knew he was not dangerous.  Educated on commitment criteria, the right of clinicians to stat the process, and the fact that [place] would commit him personally if he felt it was the only way to keep him safe from himself and he presented a credible threat to himself or others.  Discussed his needs and wishes at this point, affirmed that if he is calming substantially back on medication, it is certainly optional whether to try to get in to an intensive program.  Meanwhile, he truly and deeply needs to establish social connections to combat loneliness and establish support that will sustain him in waves of distress.  Reviewed urgent care process at this office and educated on the 988 service and warm line should he feel stuck without support and need to reach out to someone to steady himself.  Therapeutic modalities: Cognitive Behavioral Therapy, Solution-Oriented/Positive Psychology, and Ego-Supportive  Mental Status/Observations:  Appearance:   Casual     Behavior:  Appropriate  Motor:  Normal  Speech/Language:   Clear and Coherent  Affect:  Appropriate and nonlabile  Mood:  dysthymic and much less intense  Thought process:  normal  Thought content:    Much less resentment  and projection  Sensory/Perceptual disturbances:    WNL  Orientation:  Fully oriented  Attention:  Good    Concentration:  Fair  Memory:  grossly intact  Insight:    Good  Judgment:   Fair  Impulse Control:  Fair   Risk Assessment: Danger to Self: No Self-injurious Behavior: No Danger to Others: No Physical Aggression / Violence: No Duty to Warn: No Access to Firearms a concern: No  Assessment of progress:  progressing  Diagnosis:    ICD-10-CM   1. Major depressive disorder, recurrent episode, moderate (HCC)  F33.1     2. Generalized anxiety disorder  F41.1     3. Mild cognitive impairment of uncertain or unknown etiology -- suspect combination of nutritional deficiency, anxiety, sedative effects, and dyssomnia with possible apnea  G31.84     4. Obstructive sleep apnea  G47.33    noncompliant with CPAP    5. Variable compliance with medication and medical advice  Z91.148     6. Social anxiety disorder  F40.10     7. Insomnia due to other mental disorder  F51.05    F99     8. Heterozygous MTHFR mutation C677T  Z15.89     9. 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     10. 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  Presumably has their number from last week's encounter. Self-soothing  develop list of activities he can do that are calming Make use of 988 warm line if no others to reach out to Sleep management Should cease staying up late with video and consent to go to bed at a reasonable hour Should retry CPAP, with appropriate guidance from DME technician or respiratory therapist, and Pt should make the contact or directly request from PCP.  Highly suspect his device will need to be reassembled and refit, then Velcro adjustment straps disabled, taped off, or "foolproofed" in some way. Social support Should try to settle on a church for the time being and persist -- [place] may be the most emotionally accessible and prepared to be welcoming Should introduce to [facility] and see it through checking out the [employer] program Should introduce to [employer] for phone companion or other social support and possibly activities of  interest Other recommendations/advice as may be noted above Continue to utilize previously learned skills ad lib Maintain medication as prescribed and work faithfully with relevant prescriber(s) if any changes are desired or seem indicated Call the clinic on-call service, 988/hotline, 911, or present to [facility] or ER if any life-threatening psychiatric crisis Return in about 1 week (around 03/31/2022) for time as already scheduled, recommend sched ahead. Already scheduled visit in this office 03/31/2022.  Blanchie Serve, PhD Richard Moore, PhD LP Clinical Psychologist, [facility] [address], [place], [place] [address] [phone]

## 2022-03-30 ENCOUNTER — Other Ambulatory Visit: Payer: Self-pay | Admitting: Physician Assistant

## 2022-03-31 ENCOUNTER — Ambulatory Visit (INDEPENDENT_AMBULATORY_CARE_PROVIDER_SITE_OTHER): Payer: Medicare Other | Admitting: Psychiatry

## 2022-03-31 DIAGNOSIS — F331 Major depressive disorder, recurrent, moderate: Secondary | ICD-10-CM | POA: Diagnosis not present

## 2022-03-31 DIAGNOSIS — F5105 Insomnia due to other mental disorder: Secondary | ICD-10-CM

## 2022-03-31 DIAGNOSIS — F428 Other obsessive-compulsive disorder: Secondary | ICD-10-CM

## 2022-03-31 DIAGNOSIS — F4010 Social phobia, unspecified: Secondary | ICD-10-CM

## 2022-03-31 DIAGNOSIS — G3184 Mild cognitive impairment, so stated: Secondary | ICD-10-CM

## 2022-03-31 DIAGNOSIS — F99 Mental disorder, not otherwise specified: Secondary | ICD-10-CM

## 2022-03-31 DIAGNOSIS — G4733 Obstructive sleep apnea (adult) (pediatric): Secondary | ICD-10-CM

## 2022-03-31 DIAGNOSIS — F341 Dysthymic disorder: Secondary | ICD-10-CM | POA: Diagnosis not present

## 2022-03-31 DIAGNOSIS — Z91148 Patient's other noncompliance with medication regimen for other reason: Secondary | ICD-10-CM

## 2022-03-31 DIAGNOSIS — Z1589 Genetic susceptibility to other disease: Secondary | ICD-10-CM

## 2022-03-31 DIAGNOSIS — F411 Generalized anxiety disorder: Secondary | ICD-10-CM

## 2022-03-31 DIAGNOSIS — F604 Histrionic personality disorder: Secondary | ICD-10-CM

## 2022-03-31 NOTE — Progress Notes (Signed)
Psychotherapy Progress Note [facility] [name], PhD LP  Patient ID: Mod[place] Ganoe [facility] [name])    MRN: [id number] Therapy format: Individual psychotherapy Date: 03/31/2022      Start: 10:19a     Stop: 11:15a     Time Spent: 56 min Location: In-person   Session narrative (presenting needs, interim history, self-report of stressors and symptoms, applications of prior therapy, status changes, and interventions made in session) Will be going to church at [facility] this noon.  Went Sunday, felt very welcome, now more settled that he can recommit.  Affirmed and encouraged.  [name] invited him to move to [facility], but his lease is locked until October, and he feels it may be impossible to find anything more affordable.  Also a little offended at her saying she's "working on something", hinting she's trying to solve his problem for him.  Assured he has authority to agree or disagree and knows his own obligations best, but if he wants to review his lease to know terms better, he should.  Could be that the price for getting out early is worth it, depending on what offer might be made.  Hypothetically, she could be raising the money to buy out his lease break, for all he knows -- worth fact-checking before judging.  Saw the cat again, but already clear he is not trying again to adopt.  It will be expensive, and it won't stay, and he'll just be disappointed again.  Offer remains open to learn more and interact for free at the pet store, but if he'd rather just pack up the project, it's OK to do that, too, and focus again on quality human interactions.  Has looked through his check register to identify waste.  Noted that he was spending repeatedly on sending silver pieces to a jeweler he knows in [facility] and stopped.  Is restraining from jewelry and silver shopping altogether.  In fact, looking at liquidating some silver and glass he's collected.  Suggested [employer]  as an outlet.  Also caught himself inflaming resentments by watching undercover boss videos on YouTube.  Wisely deciding to lay off watching them in the evening.  Affirmed and encouraged in keeping whatever he watches actually constructive, entertaining, or relaxing before bed.  Therapeutic modalities: Cognitive Behavioral Therapy, Solution-Oriented/Positive Psychology, and Ego-Supportive  Mental Status/Observations:  Appearance:   Casual     Behavior:  Appropriate  Motor:  Normal  Speech/Language:   Clear and Coherent  Affect:  Appropriate  Mood:  More subdued, nonlabile  Thought process:  normal  Thought content:    WNL  Sensory/Perceptual disturbances:    WNL  Orientation:  Fully oriented  Attention:  Good    Concentration:  Fair  Memory:  grossly intact  Insight:    Fair  Judgment:   Good  Impulse Control:  Fair   Risk Assessment: Danger to Self: No Self-injurious Behavior: No Danger to Others: No Physical Aggression / Violence: No Duty to Warn: No Access to Firearms a concern: No  Assessment of progress:  progressing  Diagnosis:   ICD-10-CM   1. Major depressive disorder, recurrent episode, moderate (HCC)  F33.1     2. 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 Generalized anxiety disorder  F41.1     4. Social anxiety disorder  F40.10     5. Compulsive shopping  F42.8     6. 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 Mild  cognitive impairment of uncertain or unknown etiology -- suspect combination of nutritional deficiency, anxiety, sedative effects, and dyssomnia  G31.84     8. Obstructive sleep apnea -- not compliant with CPAP  G47.33     9. Variable compliance with medication and medical advice  Z91.148     10. Insomnia due to other mental disorder  F51.05    F99     11. Heterozygous MTHFR mutation C677T  Z15.89      Plan:  Safety Continuing pledge no danger to self intended nor fantasized Continuing option to refer to IOP if available (may be Medicare  does not cover) or to [facility] for more active, peer-support based intervention [phone]).  Presumably has their number from last week's encounter. Self-soothing and addictive behaviors develop list of activities he can do that are calming Make use of 988 warm line if no others to reach out to Assess porn watching for deleterious effects and consider taking a holiday Endorse restraining silver and crystal purchases and liquidating excess Sleep management Should cease staying up late with video and consent to go to bed at a reasonable hour Chromebook set to blue light control, may consider further measures to control light stimulation Should retry CPAP, with appropriate guidance from DME technician or respiratory therapist, and Pt should make the contact or directly request from PCP.  Highly suspect his device will need to be reassembled and refit, then Velcro adjustment straps disabled, taped off, or "foolproofed" in some way. Nutrition Maintain vitamin supplementation -- esp B complex for brain health MTHFR may mean true need for methylated B12 and folate -- defer cost to see if higher does are enough Should seek variety in nutrition, including enough protein and rich enough supply of colorful vegetables Social support Should try to settle on a church for the time being and persist Should introduce to [facility] and see it through checking out the [employer] program and any other social support they may offer Should introduce to [employer] for phone companion or other social support and possibly activities of interest In addressing people who might be friends or support, he should try hard not to prejudge motives or intentions and check facts Similarly, he should refrain from looking at male acquaintances purely as potential dates, or sex.  He will need friendships, and any signals he is driving for more than that will alienate potential  friends. Financial Maintain accurate records Further assess waste and value Should get an accurate picture of savings, funding, and housing and other costs to evaluate fears of poverty and ability to locate where he has more social opportunity Other recommendations/advice as may be noted above Continue to utilize previously learned skills ad lib Maintain medication as prescribed and work faithfully with relevant prescriber(s) if any changes are desired or seem indicated Call the clinic on-call service, 988/hotline, 911, or present to [facility] or ER if any life-threatening psychiatric crisis No follow-ups on file. Already scheduled visit in this office 04/07/2022.  Blanchie Serve, PhD [name], PhD LP Clinical Psychologist, [facility] [address] [place], [place] [address] [phone]

## 2022-04-07 ENCOUNTER — Ambulatory Visit (INDEPENDENT_AMBULATORY_CARE_PROVIDER_SITE_OTHER): Payer: Medicare Other | Admitting: Psychiatry

## 2022-04-07 DIAGNOSIS — F331 Major depressive disorder, recurrent, moderate: Secondary | ICD-10-CM

## 2022-04-07 DIAGNOSIS — R37 Sexual dysfunction, unspecified: Secondary | ICD-10-CM

## 2022-04-07 DIAGNOSIS — F411 Generalized anxiety disorder: Secondary | ICD-10-CM

## 2022-04-07 DIAGNOSIS — G4733 Obstructive sleep apnea (adult) (pediatric): Secondary | ICD-10-CM

## 2022-04-07 DIAGNOSIS — F428 Other obsessive-compulsive disorder: Secondary | ICD-10-CM

## 2022-04-07 DIAGNOSIS — Z1589 Genetic susceptibility to other disease: Secondary | ICD-10-CM

## 2022-04-07 DIAGNOSIS — F341 Dysthymic disorder: Secondary | ICD-10-CM | POA: Diagnosis not present

## 2022-04-07 DIAGNOSIS — G3184 Mild cognitive impairment, so stated: Secondary | ICD-10-CM

## 2022-04-07 DIAGNOSIS — F4010 Social phobia, unspecified: Secondary | ICD-10-CM

## 2022-04-07 DIAGNOSIS — F99 Mental disorder, not otherwise specified: Secondary | ICD-10-CM

## 2022-04-07 DIAGNOSIS — F604 Histrionic personality disorder: Secondary | ICD-10-CM

## 2022-04-07 DIAGNOSIS — F5105 Insomnia due to other mental disorder: Secondary | ICD-10-CM

## 2022-04-07 DIAGNOSIS — Z91148 Patient's other noncompliance with medication regimen for other reason: Secondary | ICD-10-CM

## 2022-04-07 NOTE — Progress Notes (Signed)
Psychotherapy Progress Note [facility] Richard Moore, PhD LP  Patient ID: Richard Wilson [facility] "Richard Wilson")    MRN: [id number] Therapy format: Individual psychotherapy Date: 04/07/2022      Start: 10:18a     Stop: 11:07a     Time Spent: 49 min Location: In-person   Session narrative (presenting needs, interim history, self-report of stressors and symptoms, applications of prior therapy, status changes, and interventions made in session) Last week did go to midday eucharist at [facility] as intended, but felt dysphoric for reasons he couldn't fathom, at risk of breaking out crying, and left quickly afterward.  Figures he was noticed, but without incident.  Suspect it was the opportunity to release a lot of tension, acknowledge loneliness, and be ministered to tenderly for a moment during an anointing he found moving and meaningful, at least moving to be touched in a priestly manner.  Surprisingly, reveals he has been looking for another therapist. Says he had supposed I could not tolerate him using foul language and speaking graphically (though, as usual, he had not asked anything, only assumed).  Before he could be reassured to the contrary, he began unpacking information on his tastes and sexual wishes, using coarse slang terms for genitalia, etc. and -- without being exhibitionistic -- revealing his tastes and "kinks" in pornography viewing and more stridently declaring what turns him on and off sexually.  Also urological concerns, including what sounds like incomplete voiding due to an enlarged prostate.  Despite a bit of sidetracking and monologuing, able to secure from him acknowledgment that he still deals with feeling he might have to closet himself, but he does feel safe and respected enough here to not need to seek another therapist.  Validated that he can have as his own normal things that turn him on, continued empathy shown for not being able to function sexually as  intended and for feeling al but lost to experience sensual love before he ages out.  Acknowledges it is refreshing and somewhat moving to be able to speak more freely in here.  In closing, reports that yesterday, just 1 week after professing a new temperance about compulsive shopping, he relapsed in silver buying, taking $1000 out of his savings and intending to splurge, but wound up only going $180 deep.  Affirmed partial self-control and encouraged in returning to his prior intentions.    Therapeutic modalities: Cognitive Behavioral Therapy, Solution-Oriented/Positive Psychology, and Ego-Supportive  Mental Status/Observations:  Appearance:   Casual     Behavior:  Appropriate  Motor:  Normal  Speech/Language:   Clear and Coherent  Affect:  Appropriate, elevated at points  Mood:  anxious, dysthymic, and more animated  Thought process:  Some flight  Thought content:    idiosyncratic  Sensory/Perceptual disturbances:    WNL  Orientation:  Fully oriented  Attention:  Good    Concentration:  Good  Memory:  grossly intact  Insight:    Good  Judgment:   Fair  Impulse Control:  Fair   Risk Assessment: Danger to Self: No Self-injurious Behavior: No Danger to Others: No Physical Aggression / Violence: No Duty to Warn: No Access to Firearms a concern: No  Assessment of progress:  stabilized after setback  Diagnosis:   ICD-10-CM   1. Major depressive disorder, recurrent episode, moderate (HCC)  F33.1     2. 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 Generalized anxiety disorder  F41.1     4. Social anxiety disorder  F40.10     5. 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 Compulsive shopping  F42.8     7. Mild cognitive impairment of uncertain or unknown etiology -- suspect combination of nutritional deficiency, anxiety, sedative effects, and dyssomnia  G31.84     8. Obstructive sleep apnea -- not compliant with CPAP  G47.33     9. Variable compliance with medication and medical  advice  Z91.148     10. Insomnia due to other mental disorder  F51.05    F99     11. Heterozygous MTHFR mutation C677T  Z15.89     12. Sexual dysfunction  R37      Plan:  Safety Continuing pledge no danger to self intended nor fantasized Continuing option to refer to IOP if available (may be Medicare does not cover) or to [facility] for more active, peer-support based intervention [phone]).  Presumably has their number from last week's encounter. Self-soothing and addictive behaviors develop list of activities he can do that are calming Make use of 988 warm line if no others to reach out to Assess porn watching for deleterious effects and consider taking a holiday Endorse restraining silver and crystal purchases and liquidating excess Sleep management Should cease staying up late with video and consent to go to bed at a reasonable hour Chromebook set to blue light control, may consider further measures to control light stimulation Should retry CPAP, with appropriate guidance from DME technician or respiratory therapist, and Pt should make the contact or directly request from PCP.  Highly suspect his device will need to be reassembled and refit, then Velcro adjustment straps disabled, taped off, or "foolproofed" in some way. Nutrition Maintain vitamin supplementation -- esp B complex for brain health MTHFR may mean true need for methylated B12 and folate -- defer cost to see if higher does are enough Should seek variety in nutrition, including enough protein and rich enough supply of colorful vegetables Social support Should try to settle on a church for the time being and persist Should introduce to [facility] and see it through checking out the [employer] program and any other social support they may offer Should introduce to [employer] for phone companion or other social support and possibly activities of interest In addressing people who might be  friends or support, he should try hard not to prejudge motives or intentions and check facts Similarly, he should refrain from looking at male acquaintances purely as potential dates, or sex.  He will need friendships, and any signals he is driving for more than that will alienate potential friends. Financial Maintain accurate records Further assess waste and value in spending Should get an accurate picture of savings, funding, and housing and other costs to evaluate fears of poverty and ability to locate where he has more social opportunity Option to refer to credit counseling, possibly available through [employer] Other recommendations/advice as may be noted above Continue to utilize previously learned skills ad lib Maintain medication as prescribed and work faithfully with relevant prescriber(s) if any changes are desired or seem indicated Call the clinic on-call service, 988/hotline, 911, or present to [facility] or ER if any life-threatening psychiatric crisis Return for time as already scheduled. Already scheduled visit in this office 04/21/2022.  Blanchie Serve, PhD Richard Moore, PhD LP Clinical Psychologist, [facility] [address] [place], [place] [address] [phone]

## 2022-04-13 ENCOUNTER — Ambulatory Visit: Payer: Medicare Other | Admitting: Family Medicine

## 2022-04-14 ENCOUNTER — Encounter: Payer: Self-pay | Admitting: Family Medicine

## 2022-04-14 ENCOUNTER — Ambulatory Visit (INDEPENDENT_AMBULATORY_CARE_PROVIDER_SITE_OTHER): Payer: Medicare Other | Admitting: Psychiatry

## 2022-04-14 ENCOUNTER — Ambulatory Visit (INDEPENDENT_AMBULATORY_CARE_PROVIDER_SITE_OTHER): Payer: Medicare Other | Admitting: Family Medicine

## 2022-04-14 VITALS — BP 112/80 | HR 84 | Temp 97.6°F | Resp 18 | Wt 186.2 lb

## 2022-04-14 DIAGNOSIS — G3184 Mild cognitive impairment, so stated: Secondary | ICD-10-CM

## 2022-04-14 DIAGNOSIS — F99 Mental disorder, not otherwise specified: Secondary | ICD-10-CM

## 2022-04-14 DIAGNOSIS — G4733 Obstructive sleep apnea (adult) (pediatric): Secondary | ICD-10-CM

## 2022-04-14 DIAGNOSIS — R3911 Hesitancy of micturition: Secondary | ICD-10-CM

## 2022-04-14 DIAGNOSIS — M79672 Pain in left foot: Secondary | ICD-10-CM

## 2022-04-14 DIAGNOSIS — F428 Other obsessive-compulsive disorder: Secondary | ICD-10-CM

## 2022-04-14 DIAGNOSIS — F4010 Social phobia, unspecified: Secondary | ICD-10-CM | POA: Diagnosis not present

## 2022-04-14 DIAGNOSIS — F341 Dysthymic disorder: Secondary | ICD-10-CM | POA: Diagnosis not present

## 2022-04-14 DIAGNOSIS — Z91148 Patient's other noncompliance with medication regimen for other reason: Secondary | ICD-10-CM

## 2022-04-14 DIAGNOSIS — F411 Generalized anxiety disorder: Secondary | ICD-10-CM | POA: Diagnosis not present

## 2022-04-14 DIAGNOSIS — F5105 Insomnia due to other mental disorder: Secondary | ICD-10-CM

## 2022-04-14 DIAGNOSIS — F331 Major depressive disorder, recurrent, moderate: Secondary | ICD-10-CM | POA: Diagnosis not present

## 2022-04-14 DIAGNOSIS — F604 Histrionic personality disorder: Secondary | ICD-10-CM

## 2022-04-14 DIAGNOSIS — Z1589 Genetic susceptibility to other disease: Secondary | ICD-10-CM

## 2022-04-14 NOTE — Progress Notes (Signed)
Psychotherapy Progress Note [facility] [name], PhD LP  Patient ID: Richard Wilson [facility] [name])    MRN: [id number] Therapy format: Individual psychotherapy Date: 04/14/2022      Start: 6:05p     Stop: 7:05p     Time Spent: 60 min Location: In-person   Session narrative (presenting needs, interim history, self-report of stressors and symptoms, applications of prior therapy, status changes, and interventions made in session) Alleges he's been angry, bordering on enraged now, for 3-4 weeks solid -- which does not match his presentation to me or others, though timeline reaches back to his last impulsive ER trip seeking comfort.  States he had grave frustrations with his computer, enough to throw it against the wall and stomp on it.  Has been getting lost, both driving and walking, and imagining hostile confrontations with various people.  Finally took advice to go to [employer] -- because he thought he could find help with the computer -- was given a tour, and was immediately put off by all the younger people there, covertly seized on an expensive gathering they were advertising as an offense (i.e., they didn't have the consideration to put on something more modest and appealing to his generation).  Never got computer assistance.  Had an inordinately hard time with parking, kept forgetting where he was, discovered he'd left his wallet home, had to fight his fury and embarrassment to go back in and beg money to pay his way out, mortified and enraged by the time he got home.  Several times referred to it as "The [employer]", possibly projecting his own self-hatred.  Allowed to vent, seemingly in hatred of both himself and the alone-among-people world he plunged himself into moving here a couple years ago.  Among other things, says he saw Dr. Redmond [employer] today for what he thought was a heel spur, and was recommended buy, or make more cheaply from cardboard, a heel cup.   Not clear what for, but he'll be damned if he'll do anything about it.  Admits more difficulty sleeping, more wakeful, feels like he's being run ragged.  Allowed to vent at some length, then supportively confronted that we agreed a few short weeks ago that if he comes to feel this overwrought, we should first check and see if he is taking meds and supplements appropriately, since abrupt withdrawal and wrong dosing have caused this repeatedly before.  Adamant that he is taking prescriptions as indicated, including Klonopin on the 1 - 0.5 - 0.5 schedule, and lithium, Lamictal, an Luvox.  And supposedly stacking B complex with unitary Bi, folic acid, and [id number].  Proposed that he could be overdoing B12, which would cause this kind of agitation.  As for the heel cup, offered it could be fasciitis, in which case stretching, massage, and Epsom salt soaks could be helpful.    Agreed actions below.  Therapeutic modalities: Cognitive Behavioral Therapy and Solution-Oriented/Positive Psychology  Mental Status/Observations:  Appearance:   Casual     Behavior:  Agitated  Motor:  Restlestness  Speech/Language:   Clear and Coherent and some pressure  Affect:  Labile  Mood:  anxious and irritable  Thought process:  Some flight, goal-directed  Thought content:    overvalued ideas and rumination  Sensory/Perceptual disturbances:    WNL  Orientation:  Fully oriented  Attention:  Good    Concentration:  Fair  Memory:  grossly intact  Insight:    Fair  Judgment:   Good  Impulse Control:  Fair   Risk Assessment: Danger to Self: No Self-injurious Behavior: No Danger to Others: No Physical Aggression / Violence: No Duty to Warn: No Access to Firearms a concern: No  Assessment of progress:  situational setback(s)  Diagnosis:   ICD-10-CM   1. Major depressive disorder, recurrent episode, moderate (HCC)  F33.1     2. 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 Generalized anxiety disorder  F41.1     4. Social  anxiety disorder  F40.10     5. 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 Mild cognitive impairment of uncertain or unknown etiology -- suspect combination of nutritional deficiency, anxiety, sedative effects, and dyssomnia  G31.84     7. Obstructive sleep apnea -- not compliant with CPAP  G47.33     8. Variable compliance with medication and medical advice  Z91.148     9. Compulsive shopping  F42.8     10. Insomnia due to other mental disorder  F51.05    F99     11. Heterozygous MTHFR mutation C677T  Z15.89      Plan:  Priority address possible vitamin overdose -- instructions written to lay off all vitamins and maintain meds, to let B12 run down and see.  Shared with psychiatry and PCP via staff message.  Offer to review meds for accuracy in office, tomorrow 5pm. Safety Continuing pledge no danger to self intended nor fantasized Continuing option to refer to IOP if available (may be Medicare does not cover) or to [facility] for more active, peer-support based intervention [phone]).  Presumably has their number from last week's encounter. Self-soothing and addictive behaviors develop list of activities he can do that are calming Make use of 988 warm line if no others to reach out to Recommend lay off porn watching -- seems to only inflame frustrated desires and distort view of interactions with other men Endorse restraining silver and crystal purchases and liquidating excess Sleep management Should cease staying up late with video and consent to go to bed at a reasonable hour Chromebook set to blue light control, may consider further measures to control light stimulation Should retry CPAP, with appropriate guidance from DME technician or respiratory therapist, and Pt should make the contact or directly request from PCP.  Highly suspect his device will need to be reassembled and refit, then Velcro adjustment straps disabled, taped off, or "foolproofed" in some  way. Nutrition Maintain vitamin supplementation -- esp B complex for brain health MTHFR may mean true need for methylated B12 and folate -- defer cost to see if higher does are enough Should seek variety in nutrition, including enough protein and rich enough supply of colorful vegetables Social support Should try to settle on a church for the time being and persist Should introduce to [facility] and see it through checking out the [employer] program and any other social support they may offer Should introduce to [employer] for phone companion or other social support and possibly activities of interest In addressing people who might be friends or support, he should try hard not to prejudge motives or intentions and check facts Similarly, he should refrain from looking at male acquaintances purely as potential dates, or sex.  He will need friendships, and any signals he is driving for more than that will alienate potential friends. Financial Maintain accurate records Further assess waste and value in spending Should get an accurate picture of savings, funding,  and housing and other costs to evaluate fears of poverty and ability to locate where he has more social opportunity Option to refer to credit counseling, possibly available through [employer] Other recommendations/advice as may be noted above Continue to utilize previously learned skills ad lib Maintain medication as prescribed and work faithfully with relevant prescriber(s) if any changes are desired or seem indicated Call the clinic on-call service, 988/hotline, 911, or present to [facility] or ER if any life-threatening psychiatric crisis Return for time as already scheduled, avail earlier @ PT's need. Already scheduled visit in this office 04/21/2022.  Blanchie Serve, PhD [name], PhD LP Clinical Psychologist, [facility] [address], [place], [place] [address] [phone]

## 2022-04-14 NOTE — Progress Notes (Signed)
   Subjective:    Patient ID: Richard Wilson, male    DOB: 02-03-1950, 72 y.o.   MRN: [id number]  HPI He is here for follow-up visit.  He has tried using the CPAP but could not tolerate this and is not interested in continuing with that.  He also complains of urinary hesitancy that occurs only in the morning after he gets up.  He is able to go the whole night without having to urinate.  His urinary flow is normal during the day with no hesitancy, decreased stream or feeling of incomplete emptying.  He also complains of a 44-month history of left heel pain.  He does have a previous history of difficulty with his feet and has orthotics.   Review of Systems     Objective:   Physical Exam Exam of his left foot does show no tenderness palpation over the Achilles tendon or the insertion.  No pain over the heel.  Full motion of the ankle.       Assessment & Plan:  Obstructive sleep apnea  Pain of left heel  Urinary hesitancy Since he is not tolerating the CPAP, we will not pursue that.  For the left heel pain.  Recommend putting cardboard in the heel of his shoes bilaterally to try to raise the heel up slightly to help relieve the pain.  If this is not successful, he will need to go to podiatry to possibly get the orthotic redone.  He was comfortable with that. In terms of the urinary hesitancy I explained that this is probably some sphincter spasm and when he gets up in the morning, recommend he sit down on the commode to see if that will help relax the bladder sphincter since he does not have trouble with urinating during the day.

## 2022-04-15 ENCOUNTER — Ambulatory Visit (INDEPENDENT_AMBULATORY_CARE_PROVIDER_SITE_OTHER): Payer: Medicare Other | Admitting: Psychiatry

## 2022-04-15 DIAGNOSIS — F4010 Social phobia, unspecified: Secondary | ICD-10-CM

## 2022-04-15 DIAGNOSIS — Z91148 Patient's other noncompliance with medication regimen for other reason: Secondary | ICD-10-CM

## 2022-04-15 DIAGNOSIS — G4733 Obstructive sleep apnea (adult) (pediatric): Secondary | ICD-10-CM

## 2022-04-15 DIAGNOSIS — F604 Histrionic personality disorder: Secondary | ICD-10-CM

## 2022-04-15 DIAGNOSIS — F411 Generalized anxiety disorder: Secondary | ICD-10-CM | POA: Diagnosis not present

## 2022-04-15 DIAGNOSIS — F331 Major depressive disorder, recurrent, moderate: Secondary | ICD-10-CM

## 2022-04-15 DIAGNOSIS — Z1589 Genetic susceptibility to other disease: Secondary | ICD-10-CM

## 2022-04-15 DIAGNOSIS — F341 Dysthymic disorder: Secondary | ICD-10-CM | POA: Diagnosis not present

## 2022-04-15 DIAGNOSIS — G3184 Mild cognitive impairment, so stated: Secondary | ICD-10-CM

## 2022-04-19 NOTE — Progress Notes (Incomplete)
Psychotherapy Progress Note [facility] [name], PhD LP  Patient ID: Richard Wilson [facility] "Richard Wilson")    MRN: [id number] Therapy format: Individual psychotherapy Date: 04/14/2022      Start: 6:05p     Stop: 6:50p     Time Spent: 45 min Location: In-person   Session narrative (presenting needs, interim history, self-report of stressors and symptoms, applications of prior therapy, status changes, and interventions made in session) Alleges he's been angry, bordering on enraged for 3-4 weeks solid -- which does not match his presentation to me or others, though timeline reaches back to his last impulsive ER trip seeking comfort.  States he had grave frustrations with computer, enough to throw it against the wall and stomp on it.  Has been getting lost, both driving and walking, and imagining hostile confrontations with various people.  Finally took advice to go to [employer], he thought to solicit help with the computer, was given a tour, and was immediately put off by all the younger people there and seized on an expensive event he can't afford as an offense to him that they didn't have the consideration to put on something more modest that he could be part of.  Never got computer assistance.  Felt they should have something more appropriate to graying people than what he saw.  Had inordinately hard time with parking, kept forgetting where he was, discovered he'd let his wallet, had to fight his fury and embarrassment to go back in and beg money to pay his way out, mortified and enraged by the time he got home.  Several times referred to it as "The [employer]", possibly projecting his own self-hatred.  Says he saw PCP today, was recommended buy, or make from cardboard, a heel cup.  Not clear what for, could be fasciitis, but he'll be damned if he'll do anything about it.    Admits more difficulty sleeping, more wakeful, feels like he's being run ragged.  Allowed to  vent at some length, then supportively confronted that we agreed a few short weeks ago that if he comes to feel this overwrought, we should first check and see if he is taking meds and supplements appropriately, since abrupt withdrawal and wrong dosing have caused this repeatedly before.  Adamant that he is taking prescriptions as indicated, including Klonopin on the 1 - 0.5 - 0.5 schedule  Therapeutic modalities: Cognitive Behavioral Therapy and Solution-Oriented/Positive Psychology  Mental Status/Observations:  Appearance:   Casual     Behavior:  Agitated  Motor:  Restlestness  Speech/Language:   Clear and Coherent and some pressure  Affect:  Labile  Mood:  anxious and irritable  Thought process:  Some flight, goal-directed  Thought content:    overvalued ideas and rumination  Sensory/Perceptual disturbances:    WNL  Orientation:  Fully oriented  Attention:  Good    Concentration:  Fair  Memory:  grossly intact  Insight:    Fair  Judgment:   Good  Impulse Control:  Fair   Risk Assessment: Danger to Self: No Self-injurious Behavior: No Danger to Others: No Physical Aggression / Violence: No Duty to Warn: No Access to Firearms a concern: No  Assessment of progress:  situational setback(s)  Diagnosis: No diagnosis found. Plan:  Priority address possible vitamin overdose -- instructions written to lay off all vitamins, maintain meds, on theory of B12 overdosing.  FYI note to psychiatry and PCP.  Offer to review meds in office tomorrow 5pm.  Other  recommendations/advice as may be noted above Continue to utilize previously learned skills ad lib Maintain medication as prescribed and work faithfully with relevant prescriber(s) if any changes are desired or seem indicated Call the clinic on-call service, 988/hotline, 911, or present to [facility] or ER if any life-threatening psychiatric crisis No follow-ups on file. Already scheduled visit in this office 04/21/2022.  Blanchie Serve,  PhD [name], PhD LP Clinical Psychologist, [facility] [address], [place], [place] [address] [phone]

## 2022-04-20 ENCOUNTER — Telehealth: Payer: Self-pay | Admitting: Family Medicine

## 2022-04-20 NOTE — Telephone Encounter (Signed)
Patient advised and verbalized understanding 

## 2022-04-20 NOTE — Telephone Encounter (Signed)
Pt called and states that he is not happy with his behavioral health provider. He wants to change providers and practices. He goes to [facility]. He wants to know who you would recommend. Please advise pt at [phone].

## 2022-04-20 NOTE — Progress Notes (Addendum)
Psychotherapy Progress Note [facility] [name], PhD LP  Patient ID: Richard Wilson [facility] "Richard Wilson")    MRN: [id number] Therapy format: Individual psychotherapy Date: 04/15/2022      Start: 5:17p     Stop: 6:06p     Time Spent: 49 min Location: In-person   Session narrative (presenting needs, interim history, self-report of stressors and symptoms, applications of prior therapy, status changes, and interventions made in session) Offered this time as a brief drop-in, to review together and verify medications and supplements, trying to check the hypothesis that he was overtaking B12; instead, Pt brought a bag that he dropped off with staff, assuming I would review them or maybe just impound them.  When brought back to review them, found the bag only contained his single-agent B1, folic acid, and [id number] bottles plus a handwritten note on a half sheet of graph paper, stating that he no longer wants to take any medicines Ms. Adelene Idler prescribes because he believe her med management to be incompetent.  Tabled the note, asked about other meds, and after some repeated questioning and what seemed to be discrepant recollections, established that Richard Wilson is not taking B complex (contrary to recent review on chart), presently not taking any of the three unitary supplements, and now he is frightened to take any of them because he's afraid they are driving him mad.  Other nervous responses, some pacing, and impulses to talk about other issues but redirected.  Resolved that his issue would not be excessive B12 if what he says is accurate, though trouble seems to be how the story keeps changing but sincere every time.  At his insistence, disposed of the single-vitamin bottles and instructed he lay off all the vitamins but remain on prescribed meds through Sunday, then if he feels OK, add back B complex.  Multiple times affirmed that he is taking Klonopin as revised several weeks ago -- 1 mg - 0.5mg   - 0.5mg , so unlikely in any acute benzo withdrawal, though he has been on Klonopin many years.  Meanwhile, interpreted that if he is not overly energized by substances, the better explanation for his tension and outrage expressed is that he has been missing a lot of sleep and ginning up distress by the way he thinks and catastrophizes his issues, and that is going to need to calm down if he wants to feel better.  Until then, we should address any physical and physiological factors, so vitamin holiday to play safe.  Reminded that he has been erratic before with meds, and after seeing several episodes, we agreed just a few weeks ago that accurate med-taking should be our first check whenever he becomes distressed like he showed yesterday.  To his note, I read it aloud to be sure he could hear his own words and choose whether to own them or not.  Affirmed in person that it is how he sees things but he knew I would have something to say about it.  Told matter of factly that I think he's jumping to conclusions, but he has a right to his own opinions and should work out such things directly with the provider more than jot them down in notes to me (not the first distressed note voicing an overreaction to something, including the idea last fall that he had offended and pleading to be accepted back, when he had been clearly told the door remains open).  Supportively confronted that he had asked his PCP last week to assume  psychiatric meds without trying to address it here.  Confronted that his psychiatrist brings the same care and concern to her work I do to mine and has been equally willing to put in extended tim trying to figure out what's best, while errors and conclusion-jumping he has made have added difficulty and some chaos to the attempt to get medication right for him.  Affirmed that he has done well to wean Klonopin as he has, and kudos for sticking with meds, but he would be begging for a lot more upheaval if he  dared stop several medicines abruptly just because he believes he's frustrated with the provider.  Allowed that his regimen is complex and maybe could stand to be simplified, but only if we approach it rationally and collaboratively, without trying to change personnel in th middle of it.  Indicated he accepts and will follow directions.  Adds that he has been terrifically lonely, and faults his sister for not calling him proactively.  Recalled him expressing earlier how he wanted to be left alone by [facility] and probed whether he could have portrayed that to her somehow, says not.  Probed whether he would like to ask, assertively, for her to initiate more contact, portrays himself too nervous to do so, so made offer to call for him, while present, which he accepted..  Call made to [facility] [place] [facility], [phone], who pleasantly accepted the call, including indication Malvin Johns has become terribly lonely and could stand to be reached out to, say weekly.  Says he has told her about a mind-numbing series of medication changes, validated that it has been hard to stabilize, but we believe we have a more stable way, and a focal need right now is stable support, as he does feel persistently alone and ill at ease here and has struggled to identify and integrate into a community.  Agreed open to followup calls as indicated, if safety, welfare, or best ways to help are at issue.  Further added by her that it is no secret he's gay, and despite any fears he may have based on her religion, she and the whole family want him to have friends and even a partner if it's in the cards.  Therapeutic modalities: Cognitive Behavioral Therapy, Solution-Oriented/Positive Psychology, and directive  Mental Status/Observations:  Appearance:   Casual     Behavior:  Appropriate  Motor:  Normal  Speech/Language:   Clear and Coherent  Affect:  Appropriate  Mood:  anxious and dysthymic  Thought process:  normal   Thought content:    Obsessions  Sensory/Perceptual disturbances:    WNL  Orientation:  Fully oriented  Attention:  Good    Concentration:  Good  Memory:  questionable  Insight:    Fair  Judgment:   Fair  Impulse Control:  Fair   Risk Assessment: Danger to Self: No Self-injurious Behavior: No Danger to Others: No Physical Aggression / Violence: No Duty to Warn: No Access to Firearms a concern: No  Assessment of progress:  situational setback(s)  Diagnosis:   ICD-10-CM   1. Major depressive disorder, recurrent episode, moderate (HCC)  F33.1     2. 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 Generalized anxiety disorder  F41.1     4. Social anxiety disorder  F40.10     5. Variable compliance with medication and medical advice  Z91.148     6. Mild cognitive impairment of uncertain or unknown etiology -- suspect combination of nutritional deficiency,  anxiety, sedative effects, and dyssomnia  G31.84     7. 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 Obstructive sleep apnea -- not compliant with CPAP  G47.33     9. Heterozygous MTHFR mutation C677T  Z15.89      Plan:  Medication service Possible but unlikely B12 surplus -- continue vitamin holiday to Sunday, then resume B complex only Do not resign psychiatry without speaking directly, airing concerns, having a true and understanding dialogue, and proving accurate med compliance is not to blame Endorse simplifying regimen if possible, after the dust settles from latest physiological disruptions, and of course tend to lifestyle and physiological issues affecting brain chemistry and distress tolerance Safety Continuing pledge no danger to self intended nor fantasized Continuing option to refer to IOP if available (may be Medicare does not cover) or to [facility] for more active, peer-support based intervention [phone]).  Presumably has their number from last week's encounter. Self-soothing and addictive behaviors develop  list of activities he can do that are calming Make use of 988 warm line if no others to reach out to Recommend lay off porn watching -- seems to only inflame frustrated desires and distort view of interactions with other men Endorse restraining silver and crystal purchases and liquidating excess Sleep management Should cease staying up late with video and consent to go to bed at a reasonable hour Chromebook set to blue light control, may consider further measures to control light stimulation Should retry CPAP, with appropriate guidance from DME technician or respiratory therapist, and Pt should make the contact or directly request from PCP.  Highly suspect his device will need to be reassembled and refit, then Velcro adjustment straps disabled, taped off, or "foolproofed" in some way. Nutrition Maintain vitamin supplementation -- esp B complex for brain health MTHFR may mean true need for methylated B12 and folate -- defer cost to see if higher does are enough Should seek variety in nutrition, including enough protein and rich enough supply of colorful vegetables Social support Should try to settle on a church for the time being and persist Should introduce to [employer] and see it through checking out the [employer] program and any other social support they may offer Should introduce to [employer] for phone companion or other social support and possibly activities of interest In addressing people who might be friends or support, he should try hard not to prejudge motives or intentions and check facts Similarly, he should refrain from looking at male acquaintances purely as potential dates, or sex.  He will need friendships, and any signals he is driving for more than that will alienate potential friends. Per verbal consent, open to receive inquiry or concerns from sister, Robby Sermon, and to provide any guidance indicated in  Financial Maintain accurate records Further  assess waste and value in spending Should get an accurate picture of savings, funding, and housing and other costs to evaluate fears of poverty and ability to locate where he has more social opportunity Option to refer to credit counseling, possibly available through [employer] Cognitive status Per testing over a year ago, manifested MCi for unclear reasons, and neuropsych recommended focus on exercises and compensatory skills; while such work has been manifestly unattainable in the midst of rolling crises the last year, it is plausible Pt is showing early signs of a dementia, which is disinhibiting his underlying anxiety and histrionic tendencies, beyond what his lonely situation, financial pressure,  unresolved stigma,  and self-blame will do. Possible that he may benefit from antidementia medication, provided it can be offered palatably  Meanwhile, the contributions of known medical conditions will need to be assessed and addressed, including kidney disease, erythrocytosis, sleep apnea effects, circadian disruption, and MTHFR Other recommendations/advice as may be noted above Continue to utilize previously learned skills ad lib Maintain medication as prescribed and work faithfully with relevant prescriber(s) if any changes are desired or seem indicated Call the clinic on-call service, 988/hotline, 911, or present to [facility] or ER if any life-threatening psychiatric crisis Return for time as already scheduled. Already scheduled visit in this office 04/21/2022.  Addendum, 04/20/22: Coordination of care messages shared with psychiatry and PCP, who says he was quite calm on 3/13 at PCP and did give any hint of declining med advice about heel padding.  New message as of today that PT sought referral to a new therapist and PCP instructed him to work things out directly with existing therapist.  Will address at next visit tomorrow, provided he keeps appt.  Addendum, 05/20/22: 3/20 was canceled by provider  due to illness, and termination via note slipped under the office door is noted separately.  Robley Fries, PhD Marliss Czar, PhD LP Clinical Psychologist, [facility] [address] [place], [place] [address] [phone]

## 2022-04-21 ENCOUNTER — Ambulatory Visit: Payer: Medicare Other | Admitting: Psychiatry

## 2022-04-27 ENCOUNTER — Ambulatory Visit (INDEPENDENT_AMBULATORY_CARE_PROVIDER_SITE_OTHER): Payer: Medicare Other | Admitting: Family Medicine

## 2022-04-27 VITALS — BP 122/84 | HR 70 | Temp 97.2°F | Wt 186.2 lb

## 2022-04-27 DIAGNOSIS — M79672 Pain in left foot: Secondary | ICD-10-CM

## 2022-04-27 NOTE — Progress Notes (Signed)
   Subjective:    Patient ID: Richard Wilson, male    DOB: 12-31-1950, 72 y.o.   MRN: [id number]  HPI He is here for recheck.  He did try to use some slight heel lift in his shoes to see if it would help and apparently has had no good effect.   Review of Systems     Objective:   Physical Exam Exam of the foot shows tenderness palpation over the lateral aspect of the calcaneus.       Assessment & Plan:  Pain of left heel - Plan: Ambulatory referral to Podiatry

## 2022-04-28 ENCOUNTER — Encounter: Payer: Self-pay | Admitting: Physician Assistant

## 2022-04-28 ENCOUNTER — Ambulatory Visit (INDEPENDENT_AMBULATORY_CARE_PROVIDER_SITE_OTHER): Payer: Medicare Other | Admitting: Physician Assistant

## 2022-04-28 DIAGNOSIS — F411 Generalized anxiety disorder: Secondary | ICD-10-CM

## 2022-04-28 DIAGNOSIS — Z91148 Patient's other noncompliance with medication regimen for other reason: Secondary | ICD-10-CM

## 2022-04-28 DIAGNOSIS — F331 Major depressive disorder, recurrent, moderate: Secondary | ICD-10-CM

## 2022-04-28 DIAGNOSIS — G4733 Obstructive sleep apnea (adult) (pediatric): Secondary | ICD-10-CM

## 2022-04-28 DIAGNOSIS — Z1589 Genetic susceptibility to other disease: Secondary | ICD-10-CM | POA: Diagnosis not present

## 2022-04-28 MED ORDER — CLONAZEPAM 1 MG PO TABS: ORAL_TABLET | ORAL | 0 refills | Status: DC

## 2022-04-28 MED ORDER — FLUVOXAMINE MALEATE 100 MG PO TABS: 100.0000 mg | ORAL_TABLET | Freq: Every day | ORAL | 0 refills | Status: DC

## 2022-04-28 NOTE — Patient Instructions (Addendum)
Stop Fluvoxamine (Luvox) 50 mg and start the 100 mg pill every night. I sent that prescription in just now.   Continue taking the Clonazapam (Klonopin) 1 mg, 1 pill every morning, 1/2 pill mid-day,, and 1/2 pill every evening.   Continue Lithium 150 mg, 1 twice a day.   Continue Lamictal 150 mg, 1 pill daily.
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
## 2022-05-07 ENCOUNTER — Ambulatory Visit (INDEPENDENT_AMBULATORY_CARE_PROVIDER_SITE_OTHER): Payer: Medicare Other | Admitting: Podiatry

## 2022-05-07 ENCOUNTER — Encounter: Payer: Self-pay | Admitting: Podiatry

## 2022-05-07 ENCOUNTER — Ambulatory Visit (INDEPENDENT_AMBULATORY_CARE_PROVIDER_SITE_OTHER): Payer: Medicare Other

## 2022-05-07 DIAGNOSIS — M7662 Achilles tendinitis, left leg: Secondary | ICD-10-CM | POA: Diagnosis not present

## 2022-05-07 DIAGNOSIS — M722 Plantar fascial fibromatosis: Secondary | ICD-10-CM

## 2022-05-07 MED ORDER — TRIAMCINOLONE ACETONIDE 10 MG/ML IJ SUSP
10.0000 mg | Freq: Once | INTRAMUSCULAR | Status: AC
  Administered 2022-05-07: 10 mg

## 2022-05-07 NOTE — Patient Instructions (Signed)

## 2022-05-09 NOTE — Progress Notes (Signed)
Its for any psychiatry patient

## 2022-05-10 NOTE — Progress Notes (Signed)
Subjective:   Patient ID: Richard Wilson, male   DOB: 72 y.o.   MRN: [id number]   HPI Patient presents stating she has developed an inflammation in the back of her left heel and states it has been very sore and hard to wear shoe gear with.  States that she has tried different shoes ice without relief of symptoms and patient does not smoke likes to be active   Review of Systems  All other systems reviewed and are negative.       Objective:  Physical Exam Vitals and nursing note reviewed.  Constitutional:      Appearance: He is well-developed.  Pulmonary:     Effort: Pulmonary effort is normal.  Musculoskeletal:        General: Normal range of motion.  Skin:    General: Skin is warm.  Neurological:     Mental Status: He is alert.     Neurovascular status intact muscle strength found to be adequate range of motion adequate with patient found to have posterior inflammation of the heel left lateral side with fluid buildup noted and pain with pressure.  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  Patient understands chances for rupture and will be easier on her foot and today I went ahead and on the lateral side I did sterile prep I carefully injected the bursal and inflammatory tendon at the lateral side 3 mg Dexasone Kenalog 5 mg Xylocaine advised on reduced activity ice therapy and heel lift reappoint to recheck as symptoms indicate  X-rays indicate posterior spur formation no indications of other pathology

## 2022-05-11 ENCOUNTER — Encounter: Payer: Self-pay | Admitting: Psychiatry

## 2022-05-11 NOTE — Progress Notes (Addendum)
Admin note for non-service contact  Patient ID: Solan Vosler  MRN: [id number] DATE: 05/11/2022  Approaching 4 weeks since last therapy encounter, notified by another staff member that an envelope was slid under the door last night, addressed to me, from PT.  Similar to previous notes, handwritten, on graph paper, but dated today, 05/11/22, simply states,   Dear Dr. Farrel Demark,  I have found another therapist to work with.  I appreciate all that you done [sic] for me, but my confidentiality [sic] in you has been broken.  I can stay no longer.   Overton Boggus  While unexpected, mysterious in light of past work, and unclear whether it is privacy or "confidence" meant by "confidentiality", it make sense to simply trust and respect this way of ending therapy without risking the appearance of attempting to chase the patient.  He is safe at last encounter, and known to have worked through his rapport issue with Ms. Claybon Jabs on 3/27 and reportedly understood the principle of bringing up all concerns/grievances with all providers (especially mental health) directly rather than the series of complaints to one about another that we have seen in recent months.    Hypothesis at this point that, in private ruminations, he imagined himself shamed behind his back by me sharing his note on Ms. Hurst with Ms. Claybon Jabs and could neither shake the image of ridicule as he imagined it nor check the facts.  Also still possible he has harbored a strong, possible erotic attraction (cf. past utterances of "I love you" and wishing I had been his father), and "breaking up" therapy helps cancel temptations.  Also still possible he come to feel too exposed, having recently revealed specifics of his sexual predilections, or that his health record somehow is too much, or that coordination among his PCP, psychiatry, and myself has been too revealing or his preferences, or other possibilities.  In any event, it is patient's prerogative.  Malvin Johns  remains welcome to resume on his own initiative if he sees fit, with best wishes for his life, recovery, and further health care services where he chooses.  Addendum, 05/20/22: A brief note simply stating "I apologize" received a few days later, then a full record request (initiated by patient) to his new therapist, Earnie Larsson, PhD.  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  Robley Fries, PhD Marliss Czar, PhD LP Clinical Psychologist, [facility] [address] [place], [place] [address] [phone]

## 2022-05-12 ENCOUNTER — Encounter: Payer: Self-pay | Admitting: Physician Assistant

## 2022-05-12 ENCOUNTER — Ambulatory Visit (INDEPENDENT_AMBULATORY_CARE_PROVIDER_SITE_OTHER): Payer: Medicare Other | Admitting: Physician Assistant

## 2022-05-12 DIAGNOSIS — G4733 Obstructive sleep apnea (adult) (pediatric): Secondary | ICD-10-CM | POA: Diagnosis not present

## 2022-05-12 DIAGNOSIS — F4010 Social phobia, unspecified: Secondary | ICD-10-CM

## 2022-05-12 DIAGNOSIS — Z1589 Genetic susceptibility to other disease: Secondary | ICD-10-CM

## 2022-05-12 DIAGNOSIS — F331 Major depressive disorder, recurrent, moderate: Secondary | ICD-10-CM | POA: Diagnosis not present

## 2022-05-12 DIAGNOSIS — F411 Generalized anxiety disorder: Secondary | ICD-10-CM | POA: Diagnosis not present

## 2022-05-12 NOTE — Progress Notes (Signed)
[facility]  Patient ID: Fontaine Beato,  MRN: [id number]  PCP: Ronnald Nian, MD  Date of Evaluation: 05/12/2022 Tme spent:20 minutes  Chief Complaint:  Chief Complaint   Depression; Anxiety; Follow-up    HISTORY/CURRENT STATUS: HPI To talk about something.   Here to ask if I'll continue prescribing his mental health medications. He's decided to change therapists to Dr. Earnie Larsson. Feels like he and Dr. Farrel Demark have gone as far as they can go and there are other things he needs help with.   Two weeks ago, we increased Luvox dose. He hasn't felt any different yet. "I don't know what it's supposed to do." States he's doing ok with mood. Not terribly depressed like he has been in the past, enjoying reading.  Energy and motivation are good.  No extreme sadness, tearfulness, or feelings of hopelessness.  Sleeping fairly good lately. Feels more rested when he gets up.  ADLs and personal hygiene are normal.   Denies any changes in concentration, making decisions, or remembering things.  Appetite has not changed.  Weight is stable. Denies suicidal or homicidal thoughts.  Anxiety is still an issue. He would like to continue decreasing the Klonopin. Not ready for a decrease right now, but asks if by Sept we could be at no more than 1 mg/day.   Patient denies increased energy with decreased need for sleep, increased talkativeness, racing thoughts, impulsivity or risky behaviors, increased spending, increased libido, grandiosity, increased irritability or anger, paranoia, or hallucinations.  Denies dizziness, syncope, seizures, numbness, tingling, tremor, tics, unsteady gait, slurred speech, confusion. Denies muscle or joint pain, stiffness, or dystonia.  Individual Medical History/ Review of Systems: Changes? :Yes    had shot in left heel last week for tendonitis. It's not hurting as much as it did.   Past medications for mental health diagnoses include: Rozerem, Trazodone caused  vivid dreams, Xanax, Buspar, mirtazapine caused excessive somnolence and fatigue with flulike symptoms, Belsomra, propranolol for tremor, Zoloft, Abilify caused tremor, Klonopin, Effexor, Ativan, Wellbutrin, Nardil, gabapentin, Librium, Luvox, Amitriptyline   Pertinent info after reviewing Dr. Marjie Skiff notes, his previous psychiatrist in [facility]. Past suicide attempt in September 1996, by taking Tylenol.  He was admitted at that time to a psychiatric hospital in [facility].  Admitted to psych hospital multiple times in the past for "nervous breakdowns."  January 1985, February 1986, October 1991, September 1996, July 2000.  Allergies: Gentamicin and Penicillins  Current Medications:  Current Outpatient Medications:    clonazePAM (KLONOPIN) 1 MG tablet, TAKE 1 TABLET BY MOUTH IN THE MORNING, 1/2 TABLET MIDDAY, 1/2 TABLET AT BEDTIME., Disp: 60 tablet, Rfl: 0   fluvoxaMINE (LUVOX) 100 MG tablet, Take 1 tablet (100 mg total) by mouth at bedtime., Disp: 30 tablet, Rfl: 0   hydrocortisone 2.5 % cream, Apply 1 Application topically as needed (itching)., Disp: , Rfl:    lamoTRIgine (LAMICTAL) 150 MG tablet, TAKE 1 TABLET BY MOUTH EVERY DAY, Disp: 90 tablet, Rfl: 0   lithium carbonate 150 MG capsule, TAKE 1 CAPSULE (150 MG TOTAL) BY MOUTH 2 (TWO) TIMES DAILY WITH A MEAL., Disp: 180 capsule, Rfl: 0   Probiotic Product (DAILY PROBIOTIC PO), Take 1 capsule by mouth daily., Disp: , Rfl:  Medication Side Effects: sexual dysfunction   Family Medical/ Social History: Changes?  no  MENTAL HEALTH EXAM:  There were no vitals taken for this visit.There is no height or weight on file to calculate BMI.  General Appearance: Casual  and Well Groomed  Eye Contact:  Good  Speech:  Clear and Coherent, Normal Rate, and Talkative  Volume:  Normal  Mood:  Euthymic  Affect:  Congruent  Thought Process:  Goal Directed and Descriptions of Associations: Circumstantial  Orientation:   Full (Time, Place, and Person)  Thought Content: Logical   Suicidal Thoughts:  No  Homicidal Thoughts:  No  Memory:  WNL  Judgement:  Good  Insight:  Good  Psychomotor Activity:  Normal and no tremor  Concentration:  Concentration: Fair and Attention Span: Fair  Recall:  Good  Fund of Knowledge: Good  Language: Good  Assets:  Desire for Improvement Financial Resources/Insurance Housing Transportation  ADL's:  Intact  Cognition: WNL  Prognosis:  Good   Gene site test results are on chart under media. See neuro psych test results on chart.Malvin Johns Dr. Arbutus Leas 01/21/2020  Note from neuro, Marlowe Kays, PA-C from 07/21/2021.   DIAGNOSES:    ICD-10-CM   1. Major depressive disorder, recurrent episode, moderate  F33.1     2. Generalized anxiety disorder  F41.1     3. Social anxiety disorder  F40.10     4. Obstructive sleep apnea -- not compliant with CPAP  G47.33     5. Heterozygous MTHFR mutation C677T  Z15.89      Receiving Psychotherapy: Yes    new therapist, Dr. Earnie Larsson  RECOMMENDATIONS:  PDMP was reviewed.  Klonopin last filled 05/08/2022. I provided 20 minutes of face to face time during this encounter, including time spent before and after the visit in records review, medical decision making, counseling pertinent to today's visit, and charting.   Discussed psychiatric med management and I'm fine with seeing him.   I explained the purpose of the Luvox, and the fact that he may not feel positive effects for another month or so. He understands.   We will gradually decrease the Klonopin, maybe at the next OV, can drop by 0.25 mg/d for awhile. He's been on this for 20 years or thereabouts so it's a slow process to go off it, or enough decrease the dose. He understands.   Not on Deplin or Cerafolin d/t cost.   Continue Klonopin 1 mg by continuing 1 p.o. every morning, 1/2 pill midday, and continue evening dose,1/2 pill qhs.  Continue Luvox 100 mg, 1 qhs.  Continue  Lamictal 150 mg  Continue lithium 150 mg, 1 p.o. twice daily. (For SI and dep) Recommend multivitamin, B complex, folic acid, thiamine, and B12. Continue therapy with Dr. Marliss Czar. Return in 4 weeks.  Melony Overly, PA-C

## 2022-05-17 ENCOUNTER — Ambulatory Visit: Payer: Medicare Other | Admitting: Psychiatry

## 2022-05-19 ENCOUNTER — Encounter: Payer: Self-pay | Admitting: Family Medicine

## 2022-05-19 ENCOUNTER — Ambulatory Visit (INDEPENDENT_AMBULATORY_CARE_PROVIDER_SITE_OTHER): Payer: Medicare Other | Admitting: Family Medicine

## 2022-05-19 VITALS — BP 122/76 | HR 76 | Temp 97.5°F | Resp 16 | Wt 185.6 lb

## 2022-05-19 DIAGNOSIS — M7918 Myalgia, other site: Secondary | ICD-10-CM | POA: Diagnosis not present

## 2022-05-19 NOTE — Progress Notes (Signed)
   Subjective:    Patient ID: Richard Wilson, male    DOB: 1950-10-06, 72 y.o.   MRN: [id number]  HPI He complains of a 1 month history of right flank discomfort.  He states is constant but does get worse with left lateral motion.  No nausea, vomiting, abdominal pain, trouble breathing.  No history of overuse or injury.  No urinary or GI symptoms.   Review of Systems     Objective:   Physical Exam Alert and in no distress.  No palpable tenderness to the paravertebral muscles.  With left lateral flexion that does increase the discomfort.       Assessment & Plan:  Musculoskeletal pain I explained that this was probably musculoskeletal in nature and to avoid anything that makes it worse as well as taking 2 extra strength Tylenol 3 times per day regularly for the next 10 to 14 days.

## 2022-05-19 NOTE — Patient Instructions (Signed)
Take 2 extra strength Tylenol 3 times per day for the next 10 to 14 days and of course if something makes it worse try to avoid it

## 2022-05-23 ENCOUNTER — Other Ambulatory Visit: Payer: Self-pay | Admitting: Physician Assistant

## 2022-05-25 ENCOUNTER — Other Ambulatory Visit: Payer: Self-pay | Admitting: Physician Assistant

## 2022-05-25 ENCOUNTER — Ambulatory Visit: Payer: Medicare Other | Admitting: Psychiatry

## 2022-05-26 ENCOUNTER — Ambulatory Visit: Payer: Medicare Other | Admitting: Physician Assistant

## 2022-05-28 ENCOUNTER — Other Ambulatory Visit: Payer: Self-pay

## 2022-05-28 ENCOUNTER — Telehealth: Payer: Self-pay | Admitting: Physician Assistant

## 2022-05-28 ENCOUNTER — Telehealth: Payer: Self-pay | Admitting: Adult Health

## 2022-05-28 MED ORDER — MIRTAZAPINE 7.5 MG PO TABS: 3.7500 mg | ORAL_TABLET | Freq: Every day | ORAL | 0 refills | Status: DC

## 2022-05-28 MED ORDER — CLONAZEPAM 1 MG PO TABS: ORAL_TABLET | ORAL | 0 refills | Status: DC

## 2022-05-28 NOTE — Addendum Note (Signed)
Addended by: Karin Lieu T on: 05/28/2022 11:38 AM   Modules accepted: Orders

## 2022-05-28 NOTE — Telephone Encounter (Signed)
Patient: Richard Wilson - 1951-01-10  Received call from after hours emergency service at 02:45 from patient regarding sleep issues.  Returned call to patient. Patient voicing concerns about sleep issues. Reports he had not slept in 24 hours. Unable to identify any specific reasons limiting his sleep. He did report intermittent napping. Reviewed current medications with patient and advised to take an additional Clonazepam 0.5mg  once. He was then instructed to follow up with the office tomorrow morning about sleep issues. Patient denied any thoughts SI or HI.

## 2022-05-28 NOTE — Telephone Encounter (Signed)
Noted  

## 2022-05-28 NOTE — Telephone Encounter (Signed)
He called after hours reporting insomnia. Did the extra Klonopin help? Remind him about sleep hygiene. Recommend restarting Mitazapine 7.5 mg, 1/2-1 qhs prn sleep.If he agrees, please pend or send in #30, no RF.  Thanks.

## 2022-05-28 NOTE — Telephone Encounter (Signed)
Next appt is 06/11/22. Richard Wilson came into office requesting refills on the following medications. Luvox, Lamictal, Klonopin and Lithium called to:  [id number] [place], [place] - [address]   Phone: [phone]  Fax: [phone]

## 2022-05-28 NOTE — Telephone Encounter (Signed)
Patient reported the 0.5 mg of additional clonazepam did not help with sleep. He did want to restart mirtazapine and Rx was sent with directions as requested.

## 2022-05-28 NOTE — Telephone Encounter (Signed)
All requested meds have been addressed.

## 2022-06-01 ENCOUNTER — Ambulatory Visit (INDEPENDENT_AMBULATORY_CARE_PROVIDER_SITE_OTHER): Payer: Medicare Other | Admitting: Family Medicine

## 2022-06-01 ENCOUNTER — Encounter: Payer: Self-pay | Admitting: Family Medicine

## 2022-06-01 ENCOUNTER — Ambulatory Visit: Payer: Medicare Other | Admitting: Psychiatry

## 2022-06-01 VITALS — BP 122/82 | HR 64 | Temp 97.5°F | Resp 16 | Wt 183.4 lb

## 2022-06-01 DIAGNOSIS — F331 Major depressive disorder, recurrent, moderate: Secondary | ICD-10-CM | POA: Diagnosis not present

## 2022-06-01 DIAGNOSIS — G251 Drug-induced tremor: Secondary | ICD-10-CM | POA: Diagnosis not present

## 2022-06-01 DIAGNOSIS — M7918 Myalgia, other site: Secondary | ICD-10-CM | POA: Diagnosis not present

## 2022-06-01 NOTE — Progress Notes (Signed)
   Subjective:    Patient ID: Richard Wilson, male    DOB: 1951/01/19, 72 y.o.   MRN: [id number]  HPI He is here for consult concerning tremor.  He states that he has been there for over a year.  He notes that especially when he reaches for something but so far he has not dropped anything.  He also complains of right hip pain.  This has been going on for several months.  No history of injury to that.  He also has concerns over the fact that he has been in counseling for over 40 years and seems to make very little progress.   Review of Systems     Objective:   Physical Exam Full motion of the right hip without pain.  No palpable tenderness to the hip area or at the inguinal area.  No hernia noted.  Slight tremor noted in an attention type pattern.       Assessment & Plan:  Drug-induced tremor  Musculoskeletal pain  Moderate episode of recurrent major depressive disorder (HCC) I explained that the tremor is something that we might need to look into at a later date but is not causing much difficulty so therefore would not pursue that too much. The reason behind his right hip pain is unclear but would prefer to monitor this and not pursue this any further at the present moment. I then discussed the constantly he is involved in for a long period of time.  Encouraged him to continue to work on giving himself permission to get better and if he is concerned about possibly switching or stopping the medications he needs to discuss this further with his therapist.

## 2022-06-02 ENCOUNTER — Ambulatory Visit (HOSPITAL_COMMUNITY)
Admission: EM | Admit: 2022-06-02 | Discharge: 2022-06-02 | Disposition: A | Discharge status: Home / Self Care | Payer: Medicare Other

## 2022-06-02 ENCOUNTER — Encounter (HOSPITAL_COMMUNITY): Payer: Self-pay

## 2022-06-02 DIAGNOSIS — R1031 Right lower quadrant pain: Secondary | ICD-10-CM

## 2022-06-02 DIAGNOSIS — R109 Unspecified abdominal pain: Secondary | ICD-10-CM

## 2022-06-02 NOTE — ED Provider Notes (Signed)
[facility]    CSN: [id number] Arrival date & time: 06/02/22  1442      History   Chief Complaint Chief Complaint  Patient presents with   Abdominal Pain    HPI Richard Wilson is a 72 y.o. male.   Patient presents to clinic for ongoing right lower quadrant/inguinal area pain for the past 2 months.  He recently saw his primary care yesterday who discussed that this was most likely musculoskeletal and to continue on with extra strength Tylenol.  The pain has been ongoing for the last 2 months, and the patient reports he has not had any improvement.  He denies nausea, vomiting, diarrhea, fevers or worsening of the pain.  Denies belching or gas, reports normal bowel movements with the last one being yesterday, no straining.  Reports pain is constant at 4.5 out of 10, nothing makes it better or worse.  He also has been coughing a lot since last June when he was diagnosed with bronchitis, unsure if this is related.  He did have an appendectomy at age 64.      The history is provided by the patient and medical records.  Abdominal Pain Associated symptoms: cough   Associated symptoms: no chest pain, no chills, no constipation, no diarrhea, no dysuria, no fatigue, no fever, no nausea, no shortness of breath, no sore throat and no vomiting     Past Medical History:  Diagnosis Date   Arthritis of knee 08/31/2017   Chronic idiopathic constipation 10/23/2020   Chronic kidney disease, stage 3a 03/19/2019   Diastolic dysfunction 08/15/2013   Diverticulosis of large intestine without hemorrhage 09/30/2013   Elevated PSA 12/22/2012   Erectile dysfunction 10/23/2020   Generalized anxiety disorder 01/01/2020   Hyperglycemia 09/24/2020   Intention tremor    Major depressive disorder 01/01/2020   Mild cognitive impairment of uncertain or unknown etiology 01/21/2021   Mild mitral regurgitation 08/15/2013   Nephrolithiasis 06/22/2016   Obstructive sleep apnea 04/13/2015    Orthostatic lightheadedness 10/23/2020   Primary osteoarthritis of both hips 07/30/2019   Pure hypercholesterolemia 12/15/2018   RBBB (right bundle branch block) 12/03/2012   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   Thrombocytopenia 04/08/2020    Patient Active Problem List   Diagnosis Date Noted   Major depressive disorder, recurrent, severe without psychotic features (HCC) 03/19/2022   Gastroesophageal reflux disease without esophagitis 02/09/2022   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   Mild cognitive impairment of uncertain or unknown etiology 01/21/2021   Chronic idiopathic constipation 10/23/2020   Erectile dysfunction 10/23/2020   Hyperglycemia 09/24/2020   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   Thrombocytopenia 04/08/2020   Generalized anxiety disorder 01/01/2020   Major depressive disorder 01/01/2020   Primary osteoarthritis of both hips 07/30/2019   Chronic kidney disease, stage 3a 03/19/2019   Hyperlipidemia 12/15/2018   Arthritis of knee 08/31/2017   Nephrolithiasis 06/22/2016   Obstructive sleep apnea 04/13/2015   Diverticulosis of large intestine without hemorrhage 09/30/2013   Mild mitral regurgitation 08/15/2013   Diastolic dysfunction 08/15/2013   Elevated PSA 12/22/2012   RBBB (right bundle branch block) 12/03/2012    Past Surgical History:  Procedure Laterality Date   APPENDECTOMY     FOOT TENDON SURGERY Right    TONSILLECTOMY         Home Medications    Prior to Admission medications   Medication Sig Start Date End Date Taking? Authorizing Provider  clonazePAM (KLONOPIN) 1 MG tablet TAKE 1 TABLET BY MOUTH IN THE MORNING,  1/2 TABLET MIDDAY, 1/2 TABLET AT BEDTIME. 05/28/22   Melony Overly T, PA-C  fluvoxaMINE (LUVOX) 100 MG tablet TAKE 1 TABLET BY MOUTH EVERYDAY AT BEDTIME 05/28/22   Hurst, Rosey Bath T, PA-C  hydrocortisone 2.5 % cream Apply 1 Application topically as needed (itching). 09/21/21   [provider]  lamoTRIgine  (LAMICTAL) 150 MG tablet TAKE 1 TABLET BY MOUTH EVERY DAY 03/30/22   Melony Overly T, PA-C  lithium carbonate 150 MG capsule TAKE 1 CAPSULE (150 MG TOTAL) BY MOUTH 2 (TWO) TIMES DAILY WITH A MEAL. 05/23/22   Hurst, Rosey Bath T, PA-C  mirtazapine (REMERON) 7.5 MG tablet Take 0.5-1 tablets (3.75-7.5 mg total) by mouth at bedtime. 05/28/22   Melony Overly T, PA-C  Probiotic Product (DAILY PROBIOTIC PO) Take 1 capsule by mouth daily.    [provider]    Family History Family History  Problem Relation Age of Onset   Cancer Mother        Ovarian   Heart disease Mother    Memory loss Mother        with advanced age   Prostate cancer Father    Throat cancer Maternal Grandfather    Heart attack Paternal Grandfather    Cancer Maternal Aunt        Breast, metastatic    Social History Social History   Tobacco Use   Smoking status: Never   Smokeless tobacco: Never  Vaping Use   Vaping Use: Never used  Substance Use Topics   Alcohol use: Not Currently   Drug use: Never     Allergies   Gentamicin and Penicillins   Review of Systems Review of Systems  Constitutional:  Negative for chills, fatigue and fever.  HENT:  Negative for sore throat.   Respiratory:  Positive for cough. Negative for shortness of breath.   Cardiovascular:  Negative for chest pain.  Gastrointestinal:  Positive for abdominal pain. Negative for abdominal distention, constipation, diarrhea, nausea, rectal pain and vomiting.  Genitourinary:  Negative for dysuria and flank pain.     Physical Exam Triage Vital Signs ED Triage Vitals  Enc Vitals Group     BP 06/02/22 1506 124/79     Pulse Rate 06/02/22 1506 82     Resp 06/02/22 1506 18     Temp 06/02/22 1506 98.3 F (36.8 C)     Temp Source 06/02/22 1506 Oral     SpO2 06/02/22 1506 96 %     Weight --      Height --      Head Circumference --      Peak Flow --      Pain Score 06/02/22 1507 5     Pain Loc --      Pain Edu? --      Excl. in GC? --     No data found.  Updated Vital Signs BP 124/79 (BP Location: Left Arm)   Pulse 82   Temp 98.3 F (36.8 C) (Oral)   Resp 18   SpO2 96%   Visual Acuity Right Eye Distance:   Left Eye Distance:   Bilateral Distance:    Right Eye Near:   Left Eye Near:    Bilateral Near:     Physical Exam Vitals and nursing note reviewed.  Constitutional:      General: He is not in acute distress.    Appearance: Normal appearance. He is well-developed.  HENT:     Head: Normocephalic and atraumatic.     Right  Ear: External ear normal.     Left Ear: External ear normal.     Nose: Nose normal.     Mouth/Throat:     Mouth: Mucous membranes are moist.  Eyes:     Conjunctiva/sclera: Conjunctivae normal.  Cardiovascular:     Rate and Rhythm: Normal rate and regular rhythm.     Heart sounds: Normal heart sounds, S1 normal and S2 normal. No murmur heard. Pulmonary:     Effort: Pulmonary effort is normal. No respiratory distress.     Breath sounds: Normal breath sounds.     Comments: Lungs vesicular posteriorly. Abdominal:     General: Bowel sounds are normal.     Palpations: Abdomen is soft.     Tenderness: There is no abdominal tenderness. There is no guarding or rebound. Negative signs include Murphy's sign, Rovsing's sign, McBurney's sign and psoas sign.     Hernia: There is no hernia in the right inguinal area.     Comments: Abdomen with active bowel sounds, without guarding, rebound or palpable mass.  No obvious right inguinal hernia.  Musculoskeletal:        General: No swelling.     Cervical back: Neck supple.  Skin:    General: Skin is warm and dry.     Capillary Refill: Capillary refill takes less than 2 seconds.  Neurological:     Mental Status: He is alert and oriented to person, place, and time.  Psychiatric:        Mood and Affect: Mood normal.        Behavior: Behavior is cooperative.      UC Treatments / Results  Labs (all labs ordered are listed, but only abnormal  results are displayed) Labs Reviewed - No data to display  EKG   Radiology No results found.  Procedures Procedures (including critical care time)  Medications Ordered in UC Medications - No data to display  Initial Impression / Assessment and Plan / UC Course  I have reviewed the triage vital signs and the nursing notes.  Pertinent labs & imaging results that were available during my care of the patient were reviewed by me and considered in my medical decision making (see chart for details).  Vitals and triage reviewed, patient is hemodynamically stable.  Right lower quadrant abdominal and right inguinal pain that has been ongoing for the past 2 months despite Tylenol use.  Abdominal exam unremarkable, no obvious inguinal hernia to the right side.  Active bowel sounds, abdomen soft and nontender.  Discussed low concern for acute abdomen.  Advised to follow-up with gastroenterology due to ongoing persistent symptoms and PCP.  Patient verbalized understanding, return and follow-up precautions reviewed, no questions at this time.    Final Clinical Impressions(s) / UC Diagnoses   Final diagnoses:  Abdominal pain, unspecified abdominal location  Right inguinal pain     Discharge Instructions      Overall your physical exam was reassuring.  You had no abdominal tenderness, no fever, no nausea, no vomiting or diarrhea.  I do not believe you are suffering from an emergent condition at this time.  Since your pain has been ongoing for the past 2 months, advised to to contact gastroenterology for further evaluation.  I would also schedule a follow-up with your primary care provider as well.  Please return to clinic or seek immediate care if you develop syncope, fever, vomiting, diarrhea, or any changes.      ED Prescriptions   None  PDMP not reviewed this encounter.   Jazaria Jarecki, [place], [place] 06/02/22 1535

## 2022-06-02 NOTE — Discharge Instructions (Addendum)
Overall your physical exam was reassuring.  You had no abdominal tenderness, no fever, no nausea, no vomiting or diarrhea.  I do not believe you are suffering from an emergent condition at this time.  Since your pain has been ongoing for the past 2 months, advised to to contact gastroenterology for further evaluation.  I would also schedule a follow-up with your primary care provider as well.  Please return to clinic or seek immediate care if you develop syncope, fever, vomiting, diarrhea, or any changes.

## 2022-06-02 NOTE — ED Triage Notes (Addendum)
Pt c/o constant RLQ pain for 2 months. States pain is worse when standing up, getting in and out of the car, and bending over. States taking tylenol with relief.  Pt states saw his PCP yesterday with no dx. Pt wanting to be seen again.

## 2022-06-03 ENCOUNTER — Telehealth: Payer: Self-pay | Admitting: Internal Medicine

## 2022-06-03 NOTE — Telephone Encounter (Signed)
Ok for an appt with next avail provider

## 2022-06-03 NOTE — Telephone Encounter (Signed)
Good Morning Dr.Pyrtle,  Supervising MD 5/1 PM  Patient came into office to schedule appointment from a referral he received from Urgent Care. States he has been having on going abdominal pain for the past two months. He has GI history in [place], [place]. The records are available for you to review on Epic, via [facility].  Please advise on scheduling, thank you!

## 2022-06-04 ENCOUNTER — Encounter: Payer: Self-pay | Admitting: Physician Assistant

## 2022-06-04 NOTE — Telephone Encounter (Signed)
Patient has been scheduled with APP 08/12/22

## 2022-06-07 ENCOUNTER — Other Ambulatory Visit: Payer: Self-pay | Admitting: Physician Assistant

## 2022-06-07 ENCOUNTER — Ambulatory Visit: Payer: Medicare Other | Admitting: Psychiatry

## 2022-06-11 ENCOUNTER — Other Ambulatory Visit: Payer: Self-pay | Admitting: Physician Assistant

## 2022-06-11 ENCOUNTER — Encounter: Payer: Self-pay | Admitting: Physician Assistant

## 2022-06-11 ENCOUNTER — Ambulatory Visit (INDEPENDENT_AMBULATORY_CARE_PROVIDER_SITE_OTHER): Payer: Medicare Other | Admitting: Physician Assistant

## 2022-06-11 DIAGNOSIS — F411 Generalized anxiety disorder: Secondary | ICD-10-CM

## 2022-06-11 DIAGNOSIS — G4733 Obstructive sleep apnea (adult) (pediatric): Secondary | ICD-10-CM

## 2022-06-11 DIAGNOSIS — F3341 Major depressive disorder, recurrent, in partial remission: Secondary | ICD-10-CM

## 2022-06-11 MED ORDER — FLUVOXAMINE MALEATE 100 MG PO TABS: ORAL_TABLET | ORAL | 2 refills | Status: DC

## 2022-06-11 MED ORDER — CLONAZEPAM 1 MG PO TABS: ORAL_TABLET | ORAL | 2 refills | Status: AC

## 2022-06-11 MED ORDER — MIRTAZAPINE 7.5 MG PO TABS: 3.7500 mg | ORAL_TABLET | Freq: Every day | ORAL | 2 refills | Status: DC

## 2022-06-11 MED ORDER — LAMOTRIGINE 150 MG PO TABS: 150.0000 mg | ORAL_TABLET | Freq: Every day | ORAL | 1 refills | Status: DC

## 2022-06-11 MED ORDER — LITHIUM CARBONATE 150 MG PO CAPS: 150.0000 mg | ORAL_CAPSULE | Freq: Two times a day (BID) | ORAL | 2 refills | Status: DC

## 2022-06-11 NOTE — Progress Notes (Signed)
[facility]  Patient ID: Richard Wilson,  MRN: [id number]  PCP: Ronnald Nian, MD  Date of Evaluation: 06/11/2022 Tme spent:20 minutes  Chief Complaint:  Chief Complaint   Anxiety; Depression; Insomnia; Follow-up    HISTORY/CURRENT STATUS: HPI For routine f/u  States he's doing well and feels like current meds are helpful. Continues to read for pleasure. Is getting outside more.  Energy and motivation are good.  No extreme sadness, tearfulness, or feelings of hopelessness.  Sleeps well now. Still doesn't use CPAP.  Feels rested when he gets up.  ADLs and personal hygiene are normal.   Denies any changes in concentration, making decisions, or remembering things.  Appetite has not changed.  Denies suicidal or homicidal thoughts.  Anxiety is well controlled. Still needs the Klonopin.  If he doesn't take it, he feels like he might get panicky.   Patient denies increased energy with decreased need for sleep, increased talkativeness, racing thoughts, impulsivity or risky behaviors, increased spending, increased libido, grandiosity, increased irritability or anger, paranoia, or hallucinations.  Denies dizziness, syncope, seizures, numbness, tingling, tremor, tics, unsteady gait, slurred speech, confusion. Denies muscle or joint pain, stiffness, or dystonia.  Individual Medical History/ Review of Systems: Changes? :Yes    has right groin pain, he saw PCP who 'said I don't have a hernia.'  Past medications for mental health diagnoses include: Rozerem, Trazodone caused vivid dreams, Xanax, Buspar, mirtazapine caused excessive somnolence and fatigue with flulike symptoms, Belsomra, propranolol for tremor, Zoloft, Abilify caused tremor, Klonopin, Effexor, Ativan, Wellbutrin, Nardil, gabapentin, Librium, Luvox, Amitriptyline   Pertinent info after reviewing Dr. Marjie Skiff notes, his previous psychiatrist in [facility]. Past suicide attempt in September 1996, by  taking Tylenol.  He was admitted at that time to a psychiatric hospital in [facility].  Admitted to psych hospital multiple times in the past for "nervous breakdowns."  January 1985, February 1986, October 1991, September 1996, July 2000.  Allergies: Gentamicin and Penicillins  Current Medications:  Current Outpatient Medications:    amitriptyline (ELAVIL) 25 MG tablet, Take 12.5 mg by mouth at bedtime., Disp: , Rfl:    hydrocortisone 2.5 % cream, Apply 1 Application topically as needed (itching)., Disp: , Rfl:    Probiotic Product (DAILY PROBIOTIC PO), Take 1 capsule by mouth daily., Disp: , Rfl:    clonazePAM (KLONOPIN) 1 MG tablet, TAKE 1 TABLET BY MOUTH IN THE MORNING, 1/2 TABLET MIDDAY, 1/2 TABLET AT BEDTIME., Disp: 60 tablet, Rfl: 2   fluvoxaMINE (LUVOX) 100 MG tablet, TAKE 1 TABLET BY MOUTH EVERYDAY AT BEDTIME, Disp: 30 tablet, Rfl: 2   lamoTRIgine (LAMICTAL) 150 MG tablet, Take 1 tablet (150 mg total) by mouth daily., Disp: 30 tablet, Rfl: 1   lithium carbonate 150 MG capsule, Take 1 capsule (150 mg total) by mouth 2 (two) times daily with a meal., Disp: 60 capsule, Rfl: 2   mirtazapine (REMERON) 7.5 MG tablet, Take 0.5-1 tablets (3.75-7.5 mg total) by mouth at bedtime., Disp: 30 tablet, Rfl: 2 Medication Side Effects: sexual dysfunction   Family Medical/ Social History: Changes?  no  MENTAL HEALTH EXAM:  There were no vitals taken for this visit.There is no height or weight on file to calculate BMI.  General Appearance: Casual and Well Groomed  Eye Contact:  Good  Speech:  Clear and Coherent, Normal Rate, and Talkative  Volume:  Normal  Mood:  Euthymic  Affect:  Congruent  Thought Process:  Goal Directed and Descriptions of Associations: Circumstantial  Orientation:  Full (Time, Place, and Person)  Thought Content: Logical   Suicidal Thoughts:  No  Homicidal Thoughts:  No  Memory:   at his normal baseline.   Judgement:  Good  Insight:  Good  Psychomotor  Activity:  Normal and no tremor  Concentration:  Concentration: Fair and Attention Span: Good  Recall:  Good  Fund of Knowledge: Good  Language: Good  Assets:  Desire for Improvement Financial Resources/Insurance Housing Transportation  ADL's:  Intact  Cognition: WNL  Prognosis:  Good   Gene site test results are on chart under media. See neuro psych test results on chart.Malvin Johns Dr. Arbutus Leas 01/21/2020  Note from neuro, Marlowe Kays, PA-C from 07/21/2021.   DIAGNOSES:    ICD-10-CM   1. Recurrent major depression in partial remission (HCC)  F33.41     2. Generalized anxiety disorder  F41.1     3. Obstructive sleep apnea -- not compliant with CPAP  G47.33       Receiving Psychotherapy: Yes    new therapist, Dr. Earnie Larsson  RECOMMENDATIONS:  PDMP was reviewed.  Klonopin last filled 06/04/2022. I provided 20 minutes of face to face time during this encounter, including time spent before and after the visit in records review, medical decision making, counseling pertinent to today's visit, and charting.   He's doing well on current mental health meds so no changes will be made. We will decrease the Klonopin a little at a time, but I don't think he would be able to do that now. He's aware of the increased risk of confusion and falls.  Not on Deplin or Cerafolin d/t cost.   Continue Amitriptyline 25 mg, 1/2 qhs.  Continue Klonopin 1 mg, 1 p.o. every morning, 1/2 pill midday, and continue evening dose,1/2 pill qhs.  Continue Luvox 100 mg, 1 qhs.  Continue Lamictal 150 mg  Continue lithium 150 mg, 1 p.o. twice daily. (For SI and dep) Recommend multivitamin, B complex, folic acid, thiamine, and B12. Continue therapy with Dr. Earnie Larsson.  Return in 2 months.   Melony Overly, PA-C

## 2022-06-22 ENCOUNTER — Telehealth: Payer: Self-pay | Admitting: Family Medicine

## 2022-06-22 NOTE — Telephone Encounter (Signed)
I agree with Vincenza Hews.  Olegario Messier please schedule patient an appointment here.

## 2022-06-22 NOTE — Telephone Encounter (Signed)
Pt came in and wanted to change his referral JCL put in from GI to oncology at [facility]. He states that Gi can't see him until July 11 and that is with a PA and he could be dead by then. Suzette Battiest was up front and ask pt why he wanted to see oncology and he stated that he didn't think GI was correct referral because he might have a tumor. Suzette Battiest tried to explain that she could try to find a earlier appt but could not change the referral to a different type doctor.  Pt got angry and said he was cancelling the GI appt. Pt was advised not to do that but walked out.  Not sure what needs to happen at this point. Sending back to [place].

## 2022-06-24 ENCOUNTER — Other Ambulatory Visit: Payer: Self-pay | Admitting: Family Medicine

## 2022-06-24 ENCOUNTER — Ambulatory Visit (INDEPENDENT_AMBULATORY_CARE_PROVIDER_SITE_OTHER): Payer: Medicare Other | Admitting: Medical

## 2022-06-24 ENCOUNTER — Encounter: Payer: Self-pay | Admitting: Medical

## 2022-06-24 VITALS — BP 110/80 | HR 81 | Temp 98.0°F | Resp 16 | Wt 185.0 lb

## 2022-06-24 DIAGNOSIS — M545 Low back pain, unspecified: Secondary | ICD-10-CM

## 2022-06-24 DIAGNOSIS — S39012A Strain of muscle, fascia and tendon of lower back, initial encounter: Secondary | ICD-10-CM | POA: Diagnosis not present

## 2022-06-24 NOTE — Progress Notes (Signed)
Subjective:  Richard Wilson is a 72 y.o. male who presents for Chief Complaint  Patient presents with   Hip Pain    Complains of right hip pain x 2 months. Pain worsens when walking, standing or bending.      Here for pain for the last month or 2,, around the hip and low back region.  It hurts to get out of a chair, to walk to, to sit.  Hurts to turn over in bed.  No injury or trauma or fall.  He has tried some Tylenol but no other treatments.  He does walk for exercise.  Stretches sometimes.  No pain down the leg, no numbness tingling or weakness in the leg.  No incontinence.  No fever.  No other aggravating or relieving factors.    No other c/o.  Past Medical History:  Diagnosis Date   Arthritis of knee 08/31/2017   Chronic idiopathic constipation 10/23/2020   Chronic kidney disease, stage 3a 03/19/2019   Diastolic dysfunction 08/15/2013   Diverticulosis of large intestine without hemorrhage 09/30/2013   Elevated PSA 12/22/2012   Erectile dysfunction 10/23/2020   Generalized anxiety disorder 01/01/2020   Hyperglycemia 09/24/2020   Intention tremor    Major depressive disorder 01/01/2020   Mild cognitive impairment of uncertain or unknown etiology 01/21/2021   Mild mitral regurgitation 08/15/2013   Nephrolithiasis 06/22/2016   Obstructive sleep apnea 04/13/2015   Orthostatic lightheadedness 10/23/2020   Primary osteoarthritis of both hips 07/30/2019   Pure hypercholesterolemia 12/15/2018   RBBB (right bundle branch block) 12/03/2012   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   Thrombocytopenia 04/08/2020   Current Outpatient Medications on File Prior to Visit  Medication Sig Dispense Refill   amitriptyline (ELAVIL) 25 MG tablet Take 12.5 mg by mouth at bedtime.     clonazePAM (KLONOPIN) 1 MG tablet TAKE 1 TABLET BY MOUTH IN THE MORNING, 1/2 TABLET MIDDAY, 1/2 TABLET AT BEDTIME. 60 tablet 2   fluvoxaMINE (LUVOX) 100 MG tablet TAKE 1 TABLET BY MOUTH EVERYDAY AT BEDTIME 30 tablet 2    hydrocortisone 2.5 % cream Apply 1 Application topically as needed (itching).     lamoTRIgine (LAMICTAL) 150 MG tablet Take 1 tablet (150 mg total) by mouth daily. 30 tablet 1   lithium carbonate 150 MG capsule Take 1 capsule (150 mg total) by mouth 2 (two) times daily with a meal. 60 capsule 2   mirtazapine (REMERON) 7.5 MG tablet Take 0.5-1 tablets (3.75-7.5 mg total) by mouth at bedtime. 30 tablet 2   Probiotic Product (DAILY PROBIOTIC PO) Take 1 capsule by mouth daily.     No current facility-administered medications on file prior to visit.     The following portions of the patient's history were reviewed and updated as appropriate: allergies, current medications, past family history, past medical history, past social history, past surgical history and problem list.  ROS Otherwise as in subjective above    Objective: BP 110/80   Pulse 81   Temp 98 F (36.7 C) (Oral)   Resp 16   Wt 185 lb (83.9 kg)   SpO2 95% Comment: room air  BMI 27.32 kg/m   General appearance: alert, no distress, well developed, well nourished Back: Tender right lumbar paraspinal, otherwise nontender, relatively normal range of motion Abdomen: Positive bowel sounds, soft, nontender no organomegaly Neuro: Normal leg strength and sensation and DTRs Pulses: 2+ radial pulses, 2+ pedal pulses, normal cap refill Ext: no edema   Assessment: Encounter Diagnoses  Name Primary?   Back  pain, lumbosacral Yes   Back strain, initial encounter      Plan: Your symptoms and exam suggest lumbar sacral pain and spasm.  Fortunately there are no signs of ruptured disc or worse scenario.  Recommendations: We will refer you to physical therapy for further treatment You can use Tylenol over-the-counter either 325 mg or 500 mg 2 or 3 times a day as needed for the next few days to help with pain You can use topical Biofreeze or IcyHot to help soothe some of the pain I recommend you stretch daily Continue with  walking You can use heat such as hot bath or hot shower to help soothe some of the pain  Of note you had a hip x-ray of your right hip in 2022 that was normal.  You had a lumbar spine x-ray September 2023 that did show some degenerative arthritic changes.  Richard Wilson was seen today for hip pain.  Diagnoses and all orders for this visit:  Back pain, lumbosacral -     Ambulatory referral to Physical Therapy  Back strain, initial encounter -     Ambulatory referral to Physical Therapy    Follow up: pending PT

## 2022-06-24 NOTE — Patient Instructions (Signed)
Your symptoms and exam suggest lumbar sacral pain and spasm.  Fortunately there are no signs of ruptured disc or worse scenario.  Recommendations: We will refer you to physical therapy for further treatment You can use Tylenol over-the-counter either 325 mg or 500 mg 2 or 3 times a day as needed for the next few days to help with pain You can use topical Biofreeze or IcyHot to help soothe some of the pain I recommend you stretch daily Continue with walking You can use heat such as hot bath or hot shower to help soothe some of the pain  Of note you had a hip x-ray of your right hip in 2022 that was normal.  You had a lumbar spine x-ray September 2023 that did show some degenerative arthritic changes.    Lumbosacral Strain A lumbosacral strain is an injury that causes pain in the lower back (lumbosacral spine). This injury usually happens from overstretching the muscles or ligaments along the spine. Ligaments are cord-like tissues that connect bones to each other. A strain can affect one or more muscles or ligaments. What are the causes? This condition may be caused by: A hard, direct hit to the back. Overstretching the lower back muscles. This may result from: A fall. Lifting something heavy. Repeated movements such as bending or crouching. What increases the risk? The following factors make you more likely to have a lumbosacral strain: Taking part in sports or activities that involve: A sudden twist of the back. Pushing or pulling motions. Being overweight or obese. Having poor strength and flexibility, especially tight hamstrings or weak muscles in the back or abdomen. Having too much of a curve in the lower back. Having a pelvis that is tilted forward. What are the signs or symptoms? The main symptom of this condition is pain in the lower back, at the site of the strain. Pain may also be felt down one or both legs. How is this diagnosed? This condition is diagnosed based  on: Your symptoms and medical history. A physical exam. During the exam: Your health care provider may push on certain areas of your back to find the source of your pain. You may be asked to bend forward, backward, and side to side to check your pain and range of motion. You may also have imaging tests, such as X-rays and an MRI. How is this treated? This condition may be treated by: Applying heat and cold to the affected area. Taking medicines for pain and to relax your muscles. Taking NSAIDs, such as ibuprofen, to help reduce swelling and discomfort. Doing stretching and strengthening exercises for your lower back. Symptoms usually improve within several weeks of treatment. But recovery time varies. When your symptoms improve, gradually return to your normal routine as soon as possible. This will help reduce pain, avoid stiffness, and keep muscle strength. Follow these instructions at home: Medicines Take over-the-counter and prescription medicines only as told by your health care provider. Ask your health care provider if the medicine prescribed to you: Requires you to avoid driving or using machinery. Can cause constipation. You may need to take these actions to prevent or treat constipation: Drink enough fluid to keep your urine pale yellow. Take over-the-counter or prescription medicines. Eat foods that are high in fiber, such as beans, whole grains, and fresh fruits and vegetables. Limit foods that are high in fat and processed sugars, such as fried or sweet foods. Managing pain, stiffness, and swelling     If told, put  ice on the injured area. Put ice in a plastic bag. Place a towel between your skin and the bag. Leave the ice on for 20 minutes, 2-3 times a day. If told, apply heat to the affected area as often as told by your health care provider. Use the heat source that your health care provider recommends, such as a moist heat pack or a heating pad. Place a towel between  your skin and the heat source. Leave the heat on for 20-30 minutes. If your skin turns bright red, remove the heat or ice right away to prevent skin damage. The risk of damage is higher if you cannot feel pain, heat, or cold. Activity Rest as told by your health care provider. Do not stay in bed. Staying in bed for more than 1-2 days can delay your recovery. Return to your normal activities as told by your health care provider. Ask your health care provider what activities are safe for you. Avoid activities that take a lot of energy for as long as told by your health care provider. Do exercises as told by your health care provider. This includes stretching and strengthening exercises. General instructions     Use good posture when sitting and standing. Avoid leaning forward when you sit, and avoid hunching over when you stand. Do not use any products that contain nicotine or tobacco. These products include cigarettes, chewing tobacco, and vaping devices, such as e-cigarettes. If you need help quitting, ask your health care provider. How is this prevented?  Warm up properly before physical activity, and cool down and stretch after being active. Use correct form when playing sports. Bend your knees and use correct posture when lifting heavy objects. Maintain a healthy weight. Sleep on a mattress with medium firmness to support your back. Do at least 150 minutes of moderate-intensity exercise each week, such as brisk walking or water aerobics. Try a form of exercise that takes stress off your back, such as swimming or stationary cycling. Maintain physical fitness, including: Strength. Flexibility. Contact a health care provider if: Your back pain does not improve after several weeks of treatment. Your symptoms get worse. You have a fever. Get help right away if: Your back pain is severe. You cannot stand or walk. You feel nauseous or you vomit. You develop any of the following: Trouble  controlling when you urinate or when you have a bowel movement. Pain in your legs. Your feet or legs get very cold, turn pale, or look blue. Weakness in your buttocks or legs. This information is not intended to replace advice given to you by your health care provider. Make sure you discuss any questions you have with your health care provider. Document Revised: 08/11/2021 Document Reviewed: 08/11/2021 Elsevier Patient Education  2024 [employer].

## 2022-07-04 ENCOUNTER — Other Ambulatory Visit: Payer: Self-pay

## 2022-07-04 ENCOUNTER — Ambulatory Visit (HOSPITAL_COMMUNITY)
Admission: EM | Admit: 2022-07-04 | Discharge: 2022-07-04 | Disposition: A | Discharge status: Home / Self Care | Payer: Medicare Other | Attending: Family Medicine | Admitting: Family Medicine

## 2022-07-04 ENCOUNTER — Ambulatory Visit (INDEPENDENT_AMBULATORY_CARE_PROVIDER_SITE_OTHER): Payer: Medicare Other

## 2022-07-04 ENCOUNTER — Encounter (HOSPITAL_COMMUNITY): Payer: Self-pay | Admitting: Emergency Medicine

## 2022-07-04 DIAGNOSIS — M25551 Pain in right hip: Secondary | ICD-10-CM

## 2022-07-04 MED ORDER — TRAMADOL HCL 50 MG PO TABS: 50.0000 mg | ORAL_TABLET | Freq: Two times a day (BID) | ORAL | 0 refills | Status: DC | PRN

## 2022-07-04 NOTE — ED Provider Notes (Signed)
[facility]    CSN: [id number] Arrival date & time: 07/04/22  1046      History   Chief Complaint No chief complaint on file.   HPI Richard Wilson is a 72 y.o. male.   HPI Here with right hip and inguinal pain for about 1 month or a little bit more than that.  No rash and no fever and no trauma.  Walking or bearing weight can make it worse.  It does not worsen with Valsalva.    Past Medical History:  Diagnosis Date   Arthritis of knee 08/31/2017   Chronic idiopathic constipation 10/23/2020   Chronic kidney disease, stage 3a 03/19/2019   Diastolic dysfunction 08/15/2013   Diverticulosis of large intestine without hemorrhage 09/30/2013   Elevated PSA 12/22/2012   Erectile dysfunction 10/23/2020   Generalized anxiety disorder 01/01/2020   Hyperglycemia 09/24/2020   Intention tremor    Major depressive disorder 01/01/2020   Mild cognitive impairment of uncertain or unknown etiology 01/21/2021   Mild mitral regurgitation 08/15/2013   Nephrolithiasis 06/22/2016   Obstructive sleep apnea 04/13/2015   Orthostatic lightheadedness 10/23/2020   Primary osteoarthritis of both hips 07/30/2019   Pure hypercholesterolemia 12/15/2018   RBBB (right bundle branch block) 12/03/2012   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   Thrombocytopenia 04/08/2020    Patient Active Problem List   Diagnosis Date Noted   Major depressive disorder, recurrent, severe without psychotic features (HCC) 03/19/2022   Gastroesophageal reflux disease without esophagitis 02/09/2022   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   Mild cognitive impairment of uncertain or unknown etiology 01/21/2021   Chronic idiopathic constipation 10/23/2020   Erectile dysfunction 10/23/2020   Hyperglycemia 09/24/2020   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   Thrombocytopenia 04/08/2020   Generalized anxiety disorder 01/01/2020   Major depressive disorder 01/01/2020   Primary osteoarthritis  of both hips 07/30/2019   Chronic kidney disease, stage 3a 03/19/2019   Hyperlipidemia 12/15/2018   Arthritis of knee 08/31/2017   Nephrolithiasis 06/22/2016   Obstructive sleep apnea 04/13/2015   Diverticulosis of large intestine without hemorrhage 09/30/2013   Mild mitral regurgitation 08/15/2013   Diastolic dysfunction 08/15/2013   Elevated PSA 12/22/2012   RBBB (right bundle branch block) 12/03/2012    Past Surgical History:  Procedure Laterality Date   APPENDECTOMY     FOOT TENDON SURGERY Right    TONSILLECTOMY         Home Medications    Prior to Admission medications   Medication Sig Start Date End Date Taking? Authorizing Provider  traMADol (ULTRAM) 50 MG tablet Take 1 tablet (50 mg total) by mouth every 12 (twelve) hours as needed (pain). 07/04/22  Yes Zenia Resides, MD  amitriptyline (ELAVIL) 25 MG tablet Take 12.5 mg by mouth at bedtime. 06/02/22   [provider]  clonazePAM (KLONOPIN) 1 MG tablet TAKE 1 TABLET BY MOUTH IN THE MORNING, 1/2 TABLET MIDDAY, 1/2 TABLET AT BEDTIME. 06/11/22   Melony Overly T, PA-C  fluvoxaMINE (LUVOX) 100 MG tablet TAKE 1 TABLET BY MOUTH EVERYDAY AT BEDTIME 06/11/22   Hurst, Teresa T, PA-C  hydrocortisone 2.5 % cream Apply 1 Application topically as needed (itching). 09/21/21   [provider]  lamoTRIgine (LAMICTAL) 150 MG tablet Take 1 tablet (150 mg total) by mouth daily. 06/11/22   Melony Overly T, PA-C  lithium carbonate 150 MG capsule Take 1 capsule (150 mg total) by mouth 2 (two) times daily with a meal. 06/11/22   Hurst, Rosey Bath T, PA-C  mirtazapine (  REMERON) 7.5 MG tablet Take 0.5-1 tablets (3.75-7.5 mg total) by mouth at bedtime. 06/11/22   Melony Overly T, PA-C  Probiotic Product (DAILY PROBIOTIC PO) Take 1 capsule by mouth daily.    [provider]    Family History Family History  Problem Relation Age of Onset   Cancer Mother        Ovarian   Heart disease Mother    Memory loss Mother        with  advanced age   Prostate cancer Father    Throat cancer Maternal Grandfather    Heart attack Paternal Grandfather    Cancer Maternal Aunt        Breast, metastatic    Social History Social History   Tobacco Use   Smoking status: Never   Smokeless tobacco: Never  Vaping Use   Vaping Use: Never used  Substance Use Topics   Alcohol use: Not Currently    Comment: wine   Drug use: Never     Allergies   Gentamicin and Penicillins   Review of Systems Review of Systems   Physical Exam Triage Vital Signs ED Triage Vitals  Enc Vitals Group     BP 07/04/22 1210 129/85     Pulse Rate 07/04/22 1210 85     Resp 07/04/22 1210 18     Temp 07/04/22 1210 97.9 F (36.6 C)     Temp Source 07/04/22 1210 Oral     SpO2 07/04/22 1210 96 %     Weight --      Height --      Head Circumference --      Peak Flow --      Pain Score 07/04/22 1209 9     Pain Loc --      Pain Edu? --      Excl. in GC? --    No data found.  Updated Vital Signs BP 129/85 (BP Location: Right Arm)   Pulse 85   Temp 97.9 F (36.6 C) (Oral)   Resp 18   SpO2 96%   Visual Acuity Right Eye Distance:   Left Eye Distance:   Bilateral Distance:    Right Eye Near:   Left Eye Near:    Bilateral Near:     Physical Exam Vitals reviewed.  Constitutional:      General: He is not in acute distress.    Appearance: He is not ill-appearing, toxic-appearing or diaphoretic.  Musculoskeletal:     Comments: There is no mass or lymphadenopathy palpated in the right inguinal area.  The proximal thigh near the inguinal area on the right is tender.  There is no rash, no erythema, and no deformity   Skin:    Coloration: Skin is not jaundiced or pale.  Neurological:     Mental Status: He is alert and oriented to person, place, and time.  Psychiatric:        Behavior: Behavior normal.      UC Treatments / Results  Labs (all labs ordered are listed, but only abnormal results are displayed) Labs Reviewed - No  data to display  EKG   Radiology DG Hip Unilat With Pelvis 2-3 Views Right  Result Date: 07/04/2022 CLINICAL DATA:  Right hip pain EXAM: DG HIP (WITH OR WITHOUT PELVIS) 2-3V RIGHT COMPARISON:  11/27/2020 FINDINGS: No fracture or dislocation is seen. No significant interval changes are noted. Degenerative changes are noted in visualized lower lumbar spine. IMPRESSION: No fracture or dislocation is seen in  right hip. Electronically Signed   By: Ernie Avena M.D.   On: 07/04/2022 12:59    Procedures Procedures (including critical care time)  Medications Ordered in UC Medications - No data to display  Initial Impression / Assessment and Plan / UC Course  I have reviewed the triage vital signs and the nursing notes.  Pertinent labs & imaging results that were available during my care of the patient were reviewed by me and considered in my medical decision making (see chart for details).       His EGFR is reduced on his last blood work, 49.  Tylenol has not reduced the pain.  X-ray does not show any acute bony abnormality.  Radiology does not see any appreciable degenerative changes.  On my review there may be a little joint space narrowing in both hip joints.  Prescription for tramadol is sent since the Tylenol has not been helping.  He has reduced kidney function and cannot take anti-inflammatories.  According to up-to-date there are interactions with tramadol in his medications and with hydrocodone and his medications.  Since he is very accommodated to his usual medications, I think a short term prescription of tramadol and only dosed every 12 hours is appropriate.  I have asked him to follow-up with his primary care.  Discussed with him that he may need some advanced imaging to discern the cause of his pain, such as a hernia   Though he does have degenerative changes in his L-spine on these x-rays unknown prior ones, this does not seem to be related to his back.  All the  symptoms are inguinal and anterior upper thigh Final Clinical Impressions(s) / UC Diagnoses   Final diagnoses:  Right hip pain     Discharge Instructions      Your x-ray does not show any obvious bony abnormality in the hips, though I do think the joint spaces are little narrowed.  Take tramadol 50 mg-- 1 tablet every 6 hours as needed for pain.  This medication can make you sleepy or dizzy  Maximum dose of Tylenol at 1 time is 1000 mg, for future reference  Please follow-up with your primary care     ED Prescriptions     Medication Sig Dispense Auth. Provider   traMADol (ULTRAM) 50 MG tablet Take 1 tablet (50 mg total) by mouth every 12 (twelve) hours as needed (pain). 6 tablet Akiya Morr, Janace Aris, MD      I have reviewed the PDMP during this encounter.   Zenia Resides, MD 07/04/22 [phone]

## 2022-07-04 NOTE — Discharge Instructions (Signed)
Your x-ray does not show any obvious bony abnormality in the hips, though I do think the joint spaces are little narrowed.  Take tramadol 50 mg-- 1 tablet every 6 hours as needed for pain.  This medication can make you sleepy or dizzy  Maximum dose of Tylenol at 1 time is 1000 mg, for future reference  Please follow-up with your primary care

## 2022-07-04 NOTE — ED Triage Notes (Signed)
Right upper leg pain, started one month ago.  Denies fall.  Denies pain in groin area.

## 2022-07-08 ENCOUNTER — Inpatient Hospital Stay: Payer: Medicare Other

## 2022-07-08 ENCOUNTER — Inpatient Hospital Stay: Payer: Medicare Other | Attending: Hematology and Oncology

## 2022-07-08 ENCOUNTER — Inpatient Hospital Stay (HOSPITAL_BASED_OUTPATIENT_CLINIC_OR_DEPARTMENT_OTHER): Payer: Medicare Other | Admitting: Hematology and Oncology

## 2022-07-08 ENCOUNTER — Other Ambulatory Visit: Payer: Self-pay

## 2022-07-08 ENCOUNTER — Encounter: Payer: Self-pay | Admitting: Hematology and Oncology

## 2022-07-08 VITALS — BP 130/76 | HR 73 | Temp 97.7°F | Resp 18 | Ht 69.0 in | Wt 186.6 lb

## 2022-07-08 VITALS — BP 120/75 | HR 72 | Temp 97.6°F | Resp 17

## 2022-07-08 DIAGNOSIS — D751 Secondary polycythemia: Secondary | ICD-10-CM | POA: Insufficient documentation

## 2022-07-08 DIAGNOSIS — D45 Polycythemia vera: Secondary | ICD-10-CM

## 2022-07-08 LAB — CBC WITH DIFFERENTIAL/PLATELET
Abs Immature Granulocytes: 0.02 10*3/uL (ref 0.00–0.07)
Basophils Absolute: 0.1 10*3/uL (ref 0.0–0.1)
Basophils Relative: 1 %
Eosinophils Absolute: 0.3 10*3/uL (ref 0.0–0.5)
Eosinophils Relative: 4 %
HCT: 54.9 % — ABNORMAL HIGH (ref 39.0–52.0)
Hemoglobin: 18 g/dL — ABNORMAL HIGH (ref 13.0–17.0)
Immature Granulocytes: 0 %
Lymphocytes Relative: 24 %
Lymphs Abs: 1.7 10*3/uL (ref 0.7–4.0)
MCH: 28.8 pg (ref 26.0–34.0)
MCHC: 32.8 g/dL (ref 30.0–36.0)
MCV: 87.8 fL (ref 80.0–100.0)
Monocytes Absolute: 0.6 10*3/uL (ref 0.1–1.0)
Monocytes Relative: 9 %
Neutro Abs: 4.4 10*3/uL (ref 1.7–7.7)
Neutrophils Relative %: 62 %
Platelets: 174 10*3/uL (ref 150–400)
RBC: 6.25 MIL/uL — ABNORMAL HIGH (ref 4.22–5.81)
RDW: 17.3 % — ABNORMAL HIGH (ref 11.5–15.5)
WBC: 7.1 10*3/uL (ref 4.0–10.5)
nRBC: 0 % (ref 0.0–0.2)

## 2022-07-08 NOTE — Progress Notes (Signed)
Richard Wilson presents today for phlebotomy per MD orders. Phlebotomy procedure started at 0916 and ended at 0929 with 18G phlebotomy kit to L AC. 489 grams removed. Patient observed for 30 minutes after procedure without any incident. Food and drink provided. Patient tolerated procedure well. IV needle removed intact.

## 2022-07-08 NOTE — Progress Notes (Signed)
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  We will only stop phlebotomy if his hemoglobin is 16 under  No orders of the defined types were placed in this encounter.   The total time spent in the appointment was 20 minutes encounter with patients including review of chart and various tests results, discussions about plan of care and coordination of care plan   All questions were answered. The patient knows to call the clinic with any problems, questions or concerns. No barriers to learning was detected.    Artis Delay, MD 6/6/20249:15 AM  INTERVAL HISTORY: Richard Wilson 72 y.o. male returns for further follow-up for secondary erythrocytosis He is not using CPAP machine for obstructive sleep apnea He is not taking his aspirin as directed I gave the patient verbal and written instructions today  SUMMARY OF HEMATOLOGIC HISTORY: Oncology History  Secondary erythrocytosis  04/25/2020 Bone Marrow Biopsy   BONE MARROW, ASPIRATE, CLOT, CORE:  -  Mildly hypercellular bone marrow with trilineage hematopoiesis with adequate maturation  -  See comment   PERIPHERAL BLOOD:  -  Erythrocytosis  -  Minimal thrombocytopenia  -  See CBC data   COMMENT:   The bone marrow shows trilineage hematopoiesis with a full spectrum of maturation, without significant dysplasia or increase in blasts.  There is no definitive morphologic evidence of involvement by a myeloid  neoplasm.  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  Correlation with clinical impressions, other laboratory data (eg. CBC trends), and pending cytogenetic results is recommended.  Cytogenetics are normal      I have reviewed the past medical history, past surgical history, social history and family history with the patient and they are unchanged from previous note.  ALLERGIES:  is allergic to gentamicin and penicillins.  MEDICATIONS:  Current Outpatient Medications  Medication Sig Dispense Refill   aspirin EC 81 MG tablet Take 81 mg by mouth daily. Swallow whole.     amitriptyline (ELAVIL) 25 MG tablet Take 12.5 mg by mouth at bedtime.     clonazePAM (KLONOPIN) 1 MG tablet TAKE 1 TABLET BY MOUTH IN THE MORNING, 1/2 TABLET MIDDAY, 1/2 TABLET AT BEDTIME. 60 tablet 2   fluvoxaMINE (LUVOX) 100 MG tablet TAKE 1 TABLET BY MOUTH EVERYDAY AT BEDTIME 30 tablet 2   hydrocortisone 2.5 % cream Apply 1 Application topically as needed (itching).     lamoTRIgine (LAMICTAL) 150 MG tablet Take 1 tablet (150 mg total) by mouth daily. 30 tablet 1   lithium carbonate 150 MG capsule Take 1 capsule (150 mg total) by mouth 2 (two) times daily with a meal. 60 capsule 2   mirtazapine (REMERON) 7.5 MG tablet Take 0.5-1 tablets (3.75-7.5 mg total) by mouth at bedtime. 30 tablet 2   Probiotic Product (DAILY PROBIOTIC PO) Take 1 capsule by mouth daily.     traMADol (ULTRAM) 50 MG  tablet Take 1 tablet (50 mg total) by mouth every 12 (twelve) hours as needed (pain). 6 tablet 0   No current facility-administered medications for this visit.     REVIEW OF SYSTEMS:   Constitutional: Denies fevers, chills or night sweats Eyes: Denies blurriness of vision Ears, nose, mouth, throat, and face: Denies mucositis or sore throat Respiratory: Denies cough, dyspnea or  wheezes Cardiovascular: Denies palpitation, chest discomfort or lower extremity swelling Gastrointestinal:  Denies nausea, heartburn or change in bowel habits Skin: Denies abnormal skin rashes Lymphatics: Denies new lymphadenopathy or easy bruising Neurological:Denies numbness, tingling or new weaknesses Behavioral/Psych: Mood is stable, no new changes  All other systems were reviewed with the patient and are negative.  PHYSICAL EXAMINATION: ECOG PERFORMANCE STATUS: 0 - Asymptomatic  Vitals:   07/08/22 0828  BP: 130/76  Pulse: 73  Resp: 18  Temp: 97.7 F (36.5 C)  SpO2: 97%   Filed Weights   07/08/22 0828  Weight: 186 lb 9.6 oz (84.6 kg)    GENERAL:alert, no distress and comfortable NEURO: alert & oriented x 3 with fluent speech, no focal motor/sensory deficits  LABORATORY DATA:  I have reviewed the data as listed     Component Value Date/Time   NA 140 03/18/2022 1950   NA 141 12/17/2021 1527   K 4.1 03/18/2022 1950   CL 104 03/18/2022 1950   CO2 26 03/18/2022 1950   GLUCOSE 91 03/18/2022 1950   BUN 13 03/18/2022 1950   BUN 18 12/17/2021 1527   CREATININE 1.52 (H) 03/18/2022 1950   CALCIUM 9.4 03/18/2022 1950   PROT 7.2 03/18/2022 1950   PROT 6.5 12/17/2021 1527   ALBUMIN 4.4 03/18/2022 1950   ALBUMIN 4.3 12/17/2021 1527   AST 20 03/18/2022 1950   ALT 19 03/18/2022 1950   ALKPHOS 118 03/18/2022 1950   BILITOT 1.1 03/18/2022 1950   BILITOT 0.5 12/17/2021 1527   GFRNONAA 49 (L) 03/18/2022 1950    No results found for: "SPEP", "UPEP"  Lab Results  Component Value Date   WBC 7.1 07/08/2022   NEUTROABS 4.4 07/08/2022   HGB 18.0 (H) 07/08/2022   HCT 54.9 (H) 07/08/2022   MCV 87.8 07/08/2022   PLT 174 07/08/2022      Chemistry      Component Value Date/Time   NA 140 03/18/2022 1950   NA 141 12/17/2021 1527   K 4.1 03/18/2022 1950   CL 104 03/18/2022 1950   CO2 26 03/18/2022 1950   BUN 13 03/18/2022 1950   BUN 18 12/17/2021 1527   CREATININE 1.52  (H) 03/18/2022 1950      Component Value Date/Time   CALCIUM 9.4 03/18/2022 1950   ALKPHOS 118 03/18/2022 1950   AST 20 03/18/2022 1950   ALT 19 03/18/2022 1950   BILITOT 1.1 03/18/2022 1950   BILITOT 0.5 12/17/2021 1527

## 2022-07-08 NOTE — Patient Instructions (Signed)

## 2022-07-08 NOTE — Assessment & Plan Note (Signed)
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  We will only stop phlebotomy if his hemoglobin is 16 under

## 2022-07-13 ENCOUNTER — Other Ambulatory Visit: Payer: Self-pay | Admitting: Physician Assistant

## 2022-08-04 ENCOUNTER — Other Ambulatory Visit: Payer: Self-pay | Admitting: Physician Assistant

## 2022-08-10 ENCOUNTER — Ambulatory Visit (INDEPENDENT_AMBULATORY_CARE_PROVIDER_SITE_OTHER): Payer: Medicare Other | Admitting: Physician Assistant

## 2022-08-10 ENCOUNTER — Encounter: Payer: Self-pay | Admitting: Physician Assistant

## 2022-08-10 DIAGNOSIS — F5105 Insomnia due to other mental disorder: Secondary | ICD-10-CM

## 2022-08-10 DIAGNOSIS — F3341 Major depressive disorder, recurrent, in partial remission: Secondary | ICD-10-CM | POA: Diagnosis not present

## 2022-08-10 DIAGNOSIS — G4733 Obstructive sleep apnea (adult) (pediatric): Secondary | ICD-10-CM | POA: Diagnosis not present

## 2022-08-10 DIAGNOSIS — F411 Generalized anxiety disorder: Secondary | ICD-10-CM

## 2022-08-10 DIAGNOSIS — F99 Mental disorder, not otherwise specified: Secondary | ICD-10-CM

## 2022-08-10 NOTE — Progress Notes (Signed)
[facility]  Patient ID: Richard Wilson,  MRN: [id number]  PCP: Ronnald Nian, MD  Date of Evaluation: 08/10/2022 Tme spent: 21 mins  Chief Complaint:  Chief Complaint   Anxiety; Depression; Insomnia; Follow-up    HISTORY/CURRENT STATUS: HPI For routine f/u.  States he's doing well as far as mental health goes. Feels like his meds are working fine and mood is 'consistent.' He still reads for pleasure, sleeps well. Not using cpap, couldn't get used to it. Has seen Hem since LOV, see note on chart. Will have phlebotomy when needed. Energy and motivation are fair to good.  No extreme sadness, tearfulness, or feelings of hopelessness. ADLs and personal hygiene are normal.   Denies any changes in concentration, making decisions, or remembering things.  Appetite has not changed.  Weight is stable.  Anxiety is well controlled.  Still takes Klonopin on a routine basis.  If he does not take it, he gets panicky.  Denies suicidal or homicidal thoughts.  Patient denies increased energy with decreased need for sleep, increased talkativeness, racing thoughts, impulsivity or risky behaviors, increased spending, increased libido, grandiosity, increased irritability or anger, paranoia, or hallucinations.  Denies dizziness, syncope, seizures, numbness, tingling, tremor, tics, unsteady gait, slurred speech, confusion. Denies muscle or joint pain, stiffness, or dystonia.  Individual Medical History/ Review of Systems: Changes? :No       Past medications for mental health diagnoses include: Rozerem, Trazodone caused vivid dreams, Xanax, Buspar, mirtazapine caused excessive somnolence and fatigue with flulike symptoms, Belsomra, propranolol for tremor, Zoloft, Abilify caused tremor, Klonopin, Effexor, Ativan, Wellbutrin, Nardil, gabapentin, Librium, Luvox, Amitriptyline   Pertinent info after reviewing Dr. Marjie Skiff notes, his previous psychiatrist in [facility]. Past suicide  attempt in September 1996, by taking Tylenol.  He was admitted at that time to a psychiatric hospital in [facility].  Admitted to psych hospital multiple times in the past for "nervous breakdowns."  January 1985, February 1986, October 1991, September 1996, July 2000.  Allergies: Gentamicin and Penicillins  Current Medications:  Current Outpatient Medications:    amitriptyline (ELAVIL) 25 MG tablet, TAKE 0.5 TABLETS BY MOUTH AT BEDTIME., Disp: 45 tablet, Rfl: 1   aspirin EC 81 MG tablet, Take 81 mg by mouth daily. Swallow whole., Disp: , Rfl:    clonazePAM (KLONOPIN) 1 MG tablet, TAKE 1 TABLET BY MOUTH IN THE MORNING, 1/2 TABLET MIDDAY, 1/2 TABLET AT BEDTIME., Disp: 60 tablet, Rfl: 2   fluvoxaMINE (LUVOX) 100 MG tablet, TAKE 1 TABLET BY MOUTH EVERYDAY AT BEDTIME, Disp: 30 tablet, Rfl: 2   hydrocortisone 2.5 % cream, Apply 1 Application topically as needed (itching)., Disp: , Rfl:    lamoTRIgine (LAMICTAL) 150 MG tablet, TAKE 1 TABLET BY MOUTH EVERY DAY, Disp: 90 tablet, Rfl: 0   lithium carbonate 150 MG capsule, Take 1 capsule (150 mg total) by mouth 2 (two) times daily with a meal., Disp: 60 capsule, Rfl: 2   mirtazapine (REMERON) 7.5 MG tablet, Take 0.5-1 tablets (3.75-7.5 mg total) by mouth at bedtime., Disp: 30 tablet, Rfl: 2   Probiotic Product (DAILY PROBIOTIC PO), Take 1 capsule by mouth daily., Disp: , Rfl:    traMADol (ULTRAM) 50 MG tablet, Take 1 tablet (50 mg total) by mouth every 12 (twelve) hours as needed (pain)., Disp: 6 tablet, Rfl: 0 Medication Side Effects: sexual dysfunction   Family Medical/ Social History: Changes?  no  MENTAL HEALTH EXAM:  There were no vitals taken for this visit.There is no height  or weight on file to calculate BMI.  General Appearance: Casual and Well Groomed  Eye Contact:  Good  Speech:  Clear and Coherent, Normal Rate, and Talkative  Volume:  Normal  Mood:  Euthymic  Affect:  Congruent  Thought Process:  Goal Directed and  Descriptions of Associations: Circumstantial  Orientation:  Full (Time, Place, and Person)  Thought Content: Logical   Suicidal Thoughts:  No  Homicidal Thoughts:  No  Memory:   at baseline  Judgement:  Good  Insight:  Good  Psychomotor Activity:  Normal  Concentration:  Concentration: Fair and Attention Span: Good  Recall:  Good  Fund of Knowledge: Good  Language: Good  Assets:  Desire for Improvement Financial Resources/Insurance Housing Transportation  ADL's:  Intact  Cognition: WNL  Prognosis:  Good   Gene site test results are on chart under media. See neuro psych test results on chart.Malvin Johns Dr. Arbutus Leas 01/21/2020  Note from neuro, Marlowe Kays, PA-C from 07/21/2021.   DIAGNOSES:    ICD-10-CM   1. Recurrent major depression in partial remission (HCC)  F33.41     2. Generalized anxiety disorder  F41.1     3. Obstructive sleep apnea -- not compliant with CPAP  G47.33     4. Insomnia due to other mental disorder  F51.05    F99      Receiving Psychotherapy: Yes    Dr. Earnie Larsson  RECOMMENDATIONS:  PDMP was reviewed.  Klonopin last filled 08/06/2022. I provided 21 minutes of face to face time during this encounter, including time spent before and after the visit in records review, medical decision making, counseling pertinent to today's visit, and charting.   He is doing well overall so no changes in meds are needed.  Encouraged use of CPAP.  Continue Amitriptyline 25 mg, 1/2 at bedtime, he takes it as needed. Continue Klonopin 1 mg, 1 p.o. every morning, 1/2 pill midday, and continue evening dose,1/2 pill qhs.  Continue Luvox 100 mg, 1 qhs.  Continue Lamictal 150 mg  Continue lithium 150 mg, 1 p.o. twice daily. (For SI and dep) Recommend multivitamin, B complex, folic acid, thiamine, and B12. Continue therapy with Dr. Earnie Larsson.  Return in 2 months.   Melony Overly, PA-C

## 2022-08-12 ENCOUNTER — Ambulatory Visit: Payer: Medicare Other | Admitting: Physician Assistant

## 2022-08-15 IMAGING — MR MR HEAD W/O CM
10 series · 48 of 48 positions shown · non-contrast
Comparison: No pertinent prior exams available for comparison.

CLINICAL DATA: Provided history: Memory loss. Additional history
provided by scanning technologist: Patient reports difficulty
finding words, bilateral hand tremors, memory loss, symptoms for
several weeks.

EXAM:
MRI HEAD WITHOUT CONTRAST
TECHNIQUE: Multiplanar, multiecho pulse sequences of the brain and surrounding
structures were obtained without intravenous contrast.

[Series 2: T1 · sagittal · 5.0mm · 0.45mm/px · 3 of 23 slices shown]
[im 1/23]
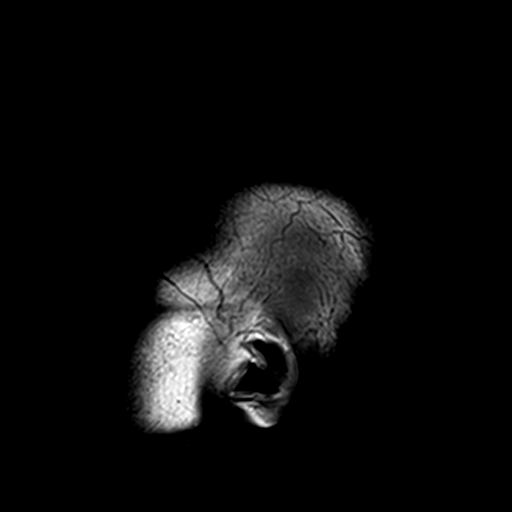
[im 12/23]
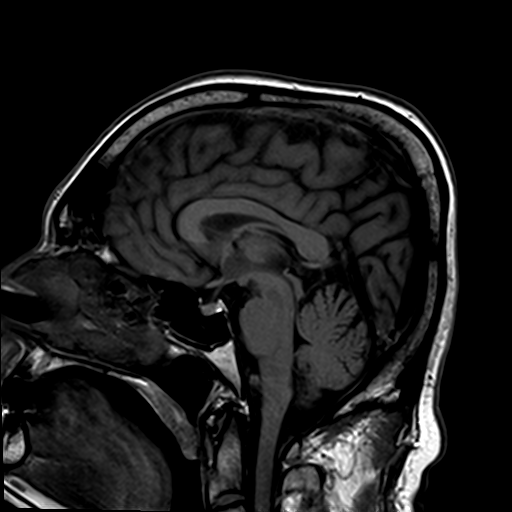
[im 23/23]
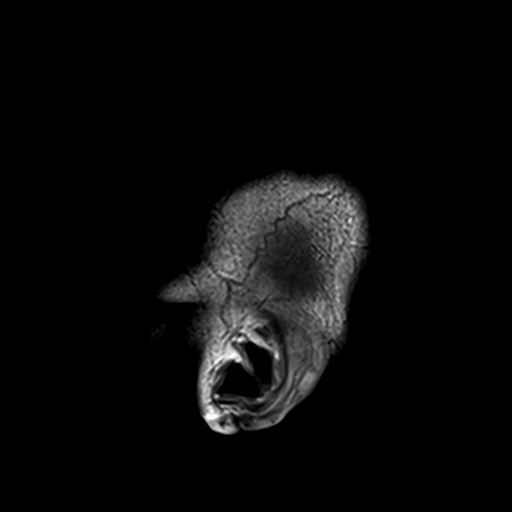

[Series 3: DWI · axial · 3.0mm · 1.80mm/px · z∈[-78,+73]mm · 9 of 102 slices shown (1 of 4)]
[im 1/102]
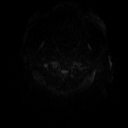
[im 13/102]
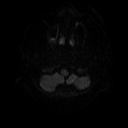
[im 26/102]
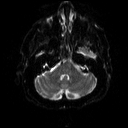
[im 38/102]
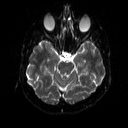
[im 51/102]
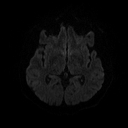
[im 64/102]
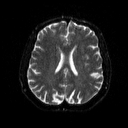
[im 76/102]
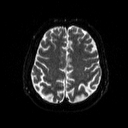
[im 89/102]
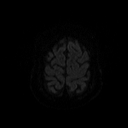
[im 102/102]
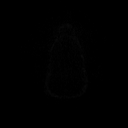

[Series 4: DWI · axial · 3.0mm · 1.80mm/px · z∈[-78,+73]mm · 4 of 49 slices shown (2 of 4)]
[im 1/49]
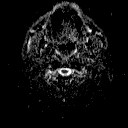
[im 17/49]
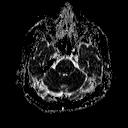
[im 33/49]
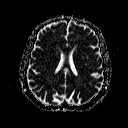
[im 49/49]
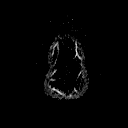

[Series 5: DWI · coronal · 5.0mm · 1.80mm/px · 6 of 68 slices shown (3 of 4)]
[im 1/68]
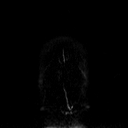
[im 14/68]
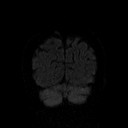
[im 27/68]
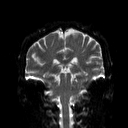
[im 41/68]
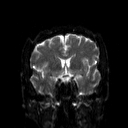
[im 54/68]
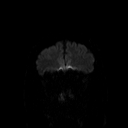
[im 68/68]
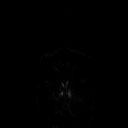

[Series 6: DWI · coronal · 5.0mm · 1.80mm/px · 3 of 34 slices shown (4 of 4)]
[im 1/34]
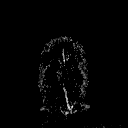
[im 17/34]
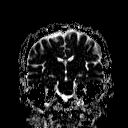
[im 34/34]
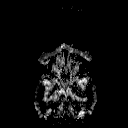

[Series 7: T2 · axial · 5.0mm · 0.60mm/px · z∈[-76,+69]mm · 2 of 22 slices shown (1 of 2)]
[im 1/22]
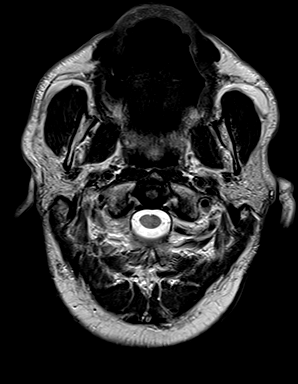
[im 22/22]
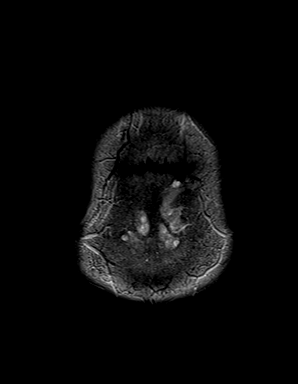

[Series 8: FLAIR · axial · 3.0mm · 0.45mm/px · z∈[-78,+69]mm · 3 of 33 slices shown]
[im 1/33]
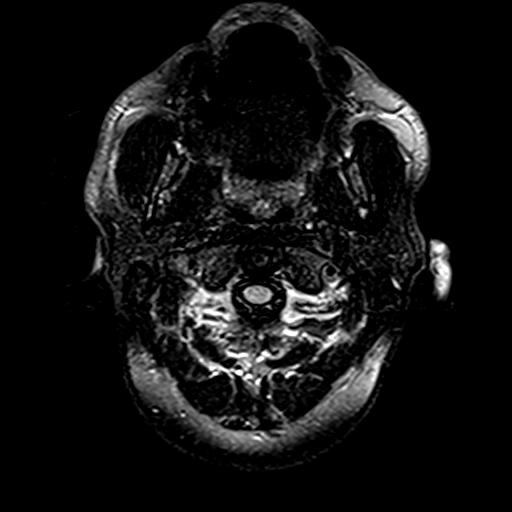
[im 17/33]
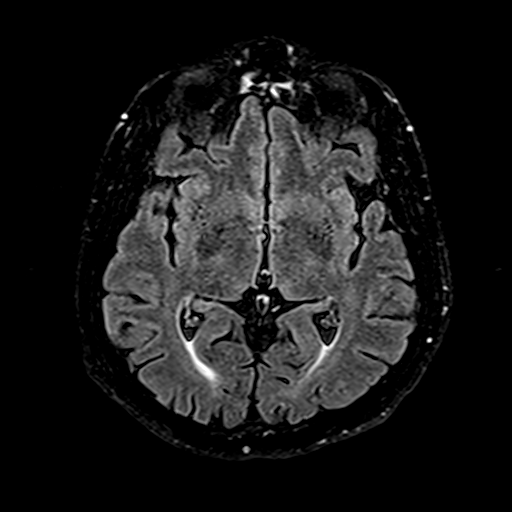
[im 33/33]
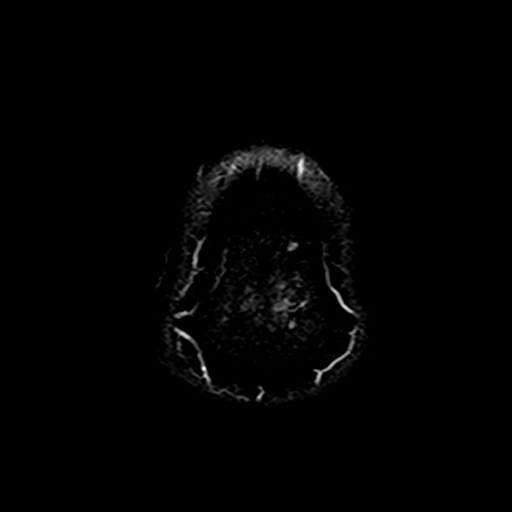

[Series 10: swi_images · axial · 4.0mm · 0.90mm/px · z∈[-73,+65]mm · 3 of 36 slices shown]
[im 1/36]
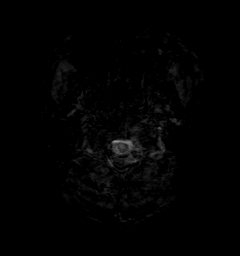
[im 18/36]
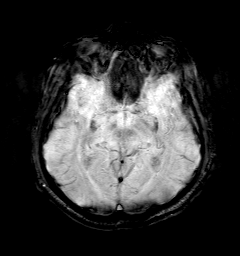
[im 36/36]
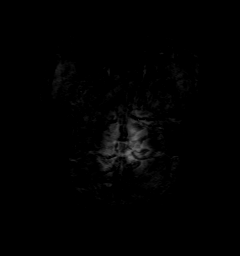

[Series 11: t1_mpr_tra · axial · 1.0mm · 0.71mm/px · z∈[-74,+67]mm · 13 of 144 slices shown]
[im 1/144]
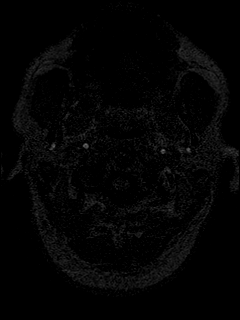
[im 12/144]
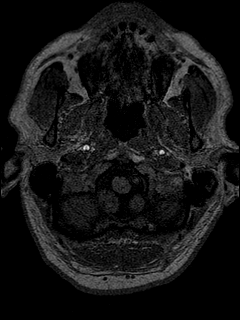
[im 24/144]
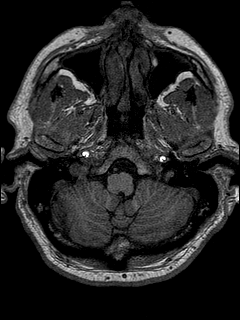
[im 36/144]
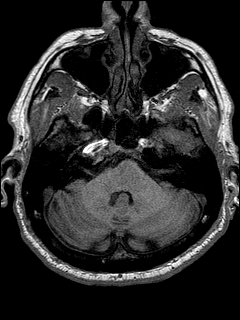
[im 48/144]
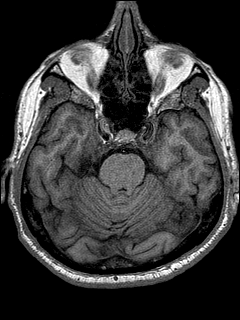
[im 60/144]
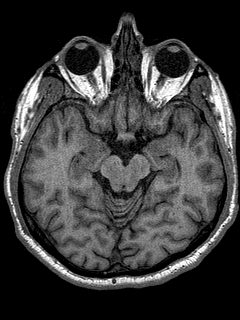
[im 72/144]
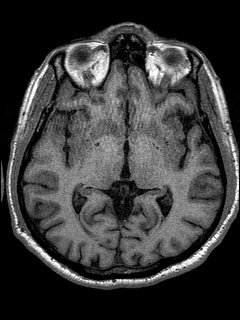
[im 84/144]
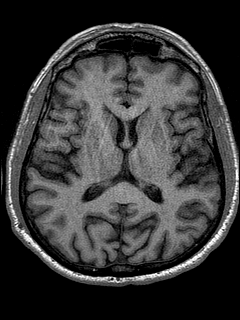
[im 96/144]
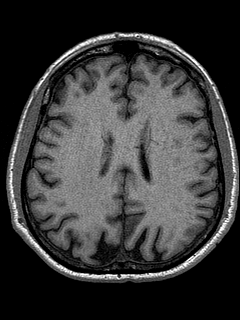
[im 108/144]
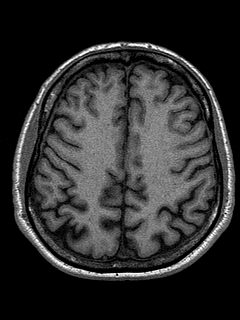
[im 120/144]
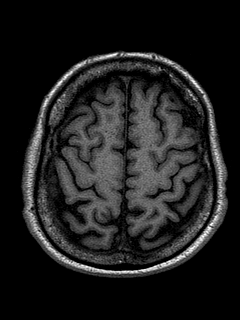
[im 132/144]
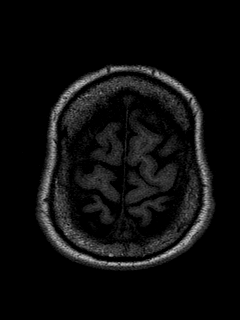
[im 144/144]
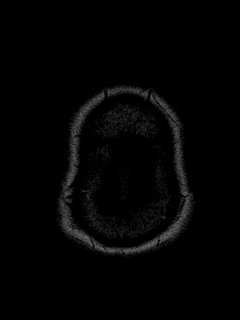

[Series 12: T2 · coronal · 5.0mm · 0.45mm/px · 2 of 26 slices shown (2 of 2)]
[im 1/26]
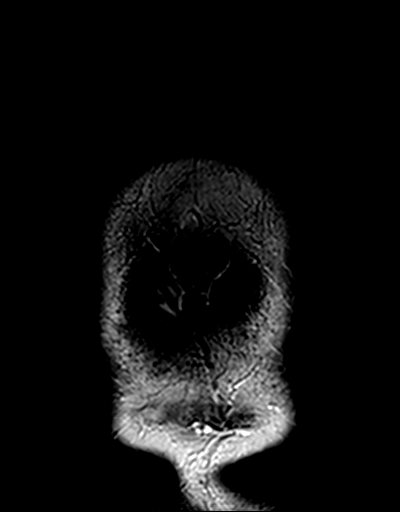
[im 26/26]
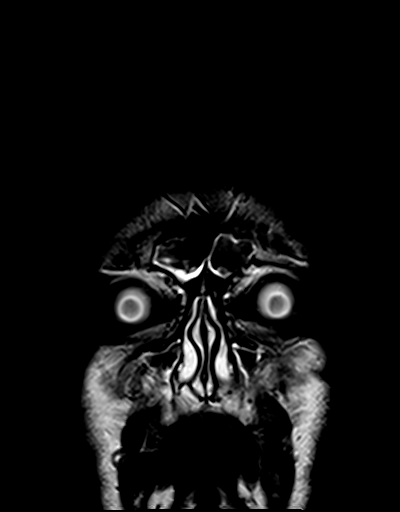

[48 of 48 positions shown; findings below may reference images not displayed]

FINDINGS: Brain:

Minimal generalized parenchymal volume loss, not unexpected for age.

Minimal multifocal T2 FLAIR hyperintense signal abnormality within
the cerebral white matter, nonspecific but compatible with chronic
small vessel ischemic disease.

Nonspecific chronic microhemorrhage within the lateral left
occipital lobe.

There is no acute infarct.

No evidence of an intracranial mass.

No extra-axial fluid collection.

No midline shift.

Vascular: Maintained flow voids within the proximal large arterial
vessels.

Skull and upper cervical spine: No focal suspicious marrow lesion.

Sinuses/Orbits: Visualized orbits show no acute finding. Mild
mucosal thickening within the bilateral frontal, ethmoid, sphenoid
and maxillary sinuses. Superimposed small mucous retention cysts
within the bilateral maxillary sinuses, the largest on the right
measuring 16 mm.
IMPRESSION: No evidence of acute intracranial abnormality.

Minimal generalized parenchymal volume loss and cerebral white
matter chronic small vessel ischemic changes.

Paranasal sinus disease, as described.

## 2022-08-25 ENCOUNTER — Encounter: Payer: Self-pay | Admitting: Physician Assistant

## 2022-08-25 ENCOUNTER — Ambulatory Visit (INDEPENDENT_AMBULATORY_CARE_PROVIDER_SITE_OTHER): Payer: Medicare Other | Admitting: Physician Assistant

## 2022-08-25 DIAGNOSIS — D751 Secondary polycythemia: Secondary | ICD-10-CM

## 2022-08-25 DIAGNOSIS — Z91148 Patient's other noncompliance with medication regimen for other reason: Secondary | ICD-10-CM

## 2022-08-25 DIAGNOSIS — Z1589 Genetic susceptibility to other disease: Secondary | ICD-10-CM

## 2022-08-25 DIAGNOSIS — F5105 Insomnia due to other mental disorder: Secondary | ICD-10-CM | POA: Diagnosis not present

## 2022-08-25 DIAGNOSIS — F99 Mental disorder, not otherwise specified: Secondary | ICD-10-CM

## 2022-08-25 DIAGNOSIS — F331 Major depressive disorder, recurrent, moderate: Secondary | ICD-10-CM

## 2022-08-25 DIAGNOSIS — F411 Generalized anxiety disorder: Secondary | ICD-10-CM

## 2022-08-25 DIAGNOSIS — G4733 Obstructive sleep apnea (adult) (pediatric): Secondary | ICD-10-CM | POA: Diagnosis not present

## 2022-08-25 DIAGNOSIS — F4010 Social phobia, unspecified: Secondary | ICD-10-CM

## 2022-08-25 MED ORDER — FLUVOXAMINE MALEATE 100 MG PO TABS: 150.0000 mg | ORAL_TABLET | Freq: Every day | ORAL | 1 refills | Status: DC

## 2022-08-25 NOTE — Patient Instructions (Signed)
On the Luvox (fluvoxamine) 100 mg, increase it to 1 and 1/2 pills every night.

## 2022-08-25 NOTE — Progress Notes (Signed)
[facility]  Patient ID: Richard Wilson,  MRN: [id number]  PCP: Ronnald Nian, MD  Date of Evaluation: 08/25/2022 Tme spent: 32 minutes  Chief Complaint:  Chief Complaint   Depression    HISTORY/CURRENT STATUS: HPI For routine f/u.  Richard Wilson was seen 08/10/2022 and was to have returned in 2 months. Seen urgently today. "I'm so depressed. I'm so lonely." He doesn't want to go out even though he pushes himself to go to the grocery store or whatever. Has been sleeping a lot. Doesn't know what else to do except stay in bed b/c there's nothing else to do. Cries easily. Feels tired all the time no matter how much sleep he gets.  Doesn't enjoy anything but does still read.   Feels hopeless.  ADLs and personal hygiene are normal.   No change in focus or concentration but he can't remember things, but it's not worse.  Can't think or words for example. Appetite has not changed.  Weight is stable.  He's cooking for himself.  Still seeing his counselor routinely. Denies suicidal or homicidal thoughts.  Anxiety is getting worse, in general. No PA. Feels overwhelmed.  Doesn't get SOB, or palpitations.   Patient denies increased energy with decreased need for sleep, increased talkativeness, racing thoughts, impulsivity or risky behaviors, increased spending, increased libido, grandiosity, increased irritability or anger, paranoia, or hallucinations.  Review of Systems  Constitutional:  Positive for malaise/fatigue.  HENT: Negative.    Eyes: Negative.   Respiratory: Negative.    Cardiovascular: Negative.   Gastrointestinal: Negative.   Genitourinary: Negative.   Musculoskeletal: Negative.   Skin: Negative.   Neurological: Negative.   Endo/Heme/Allergies: Negative.   Psychiatric/Behavioral:         See HPI   Individual Medical History/ Review of Systems: Changes? 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  See note from hematology 07/08/2022  Past medications  for mental health diagnoses include: Rozerem, Trazodone caused vivid dreams, Xanax, Buspar, mirtazapine caused excessive somnolence and fatigue with flulike symptoms, Belsomra, propranolol for tremor, Zoloft, Abilify caused tremor, Klonopin, Effexor, Ativan, Wellbutrin, Nardil, gabapentin, Librium, Luvox, Amitriptyline   Pertinent info after reviewing Dr. Marjie Skiff notes, his previous psychiatrist in [facility]. Past suicide attempt in September 1996, by taking Tylenol.  He was admitted at that time to a psychiatric hospital in [facility].  Admitted to psych hospital multiple times in the past for "nervous breakdowns."  January 1985, February 1986, October 1991, September 1996, July 2000.  Allergies: Gentamicin and Penicillins  Current Medications:  Current Outpatient Medications:    aspirin EC 81 MG tablet, Take 81 mg by mouth daily. Swallow whole., Disp: , Rfl:    clonazePAM (KLONOPIN) 1 MG tablet, TAKE 1 TABLET BY MOUTH IN THE MORNING, 1/2 TABLET MIDDAY, 1/2 TABLET AT BEDTIME., Disp: 60 tablet, Rfl: 2   hydrocortisone 2.5 % cream, Apply 1 Application topically as needed (itching)., Disp: , Rfl:    lamoTRIgine (LAMICTAL) 150 MG tablet, TAKE 1 TABLET BY MOUTH EVERY DAY, Disp: 90 tablet, Rfl: 0   lithium carbonate 150 MG capsule, Take 1 capsule (150 mg total) by mouth 2 (two) times daily with a meal., Disp: 60 capsule, Rfl: 2   mirtazapine (REMERON) 7.5 MG tablet, Take 0.5-1 tablets (3.75-7.5 mg total) by mouth at bedtime., Disp: 30 tablet, Rfl: 2   Probiotic Product (DAILY PROBIOTIC PO), Take 1 capsule by mouth daily., Disp: , Rfl:    amitriptyline (ELAVIL) 25  MG tablet, TAKE 0.5 TABLETS BY MOUTH AT BEDTIME. (Patient not taking: Reported on 08/25/2022), Disp: 45 tablet, Rfl: 1   fluvoxaMINE (LUVOX) 100 MG tablet, Take 1.5 tablets (150 mg total) by mouth at bedtime., Disp: 45 tablet, Rfl: 1   traMADol (ULTRAM) 50 MG tablet, Take 1 tablet (50 mg total) by mouth  every 12 (twelve) hours as needed (pain). (Patient not taking: Reported on 08/25/2022), Disp: 6 tablet, Rfl: 0 Medication Side Effects: sexual dysfunction   Family Medical/ Social History: Changes?  no  MENTAL HEALTH EXAM:  There were no vitals taken for this visit.There is no height or weight on file to calculate BMI.  General Appearance: Casual and Well Groomed  Eye Contact:  Good  Speech:  Clear and Coherent, Normal Rate, and Talkative  Volume:  Normal  Mood:   sad  Affect:  Congruent  Thought Process:  Goal Directed and Descriptions of Associations: Circumstantial  Orientation:  Full (Time, Place, and Person)  Thought Content: Logical   Suicidal Thoughts:  No  Homicidal Thoughts:  No  Memory:   at baseline  Judgement:  Good  Insight:  Good  Psychomotor Activity:  Normal  Concentration:  Concentration: Fair and Attention Span: Good  Recall:  Good  Fund of Knowledge: Good  Language: Good  Assets:  Desire for Improvement Financial Resources/Insurance Housing Transportation  ADL's:  Intact  Cognition: WNL  Prognosis:  Good   Gene site test results are on chart under media. See neuro psych test results on chart.Richard Wilson  Note from neuro, Marlowe Kays, PA-C from 07/21/2021.   DIAGNOSES:    ICD-10-CM   1. Major depressive disorder, recurrent episode, moderate (HCC)  F33.1     2. Generalized anxiety disorder  F41.1     3. Insomnia due to other mental disorder  F51.05    F99     4. Obstructive sleep apnea -- not compliant with CPAP  G47.33     5. Social anxiety disorder  F40.10     6. Variable compliance with medication and medical advice  Z91.148     7. Heterozygous MTHFR mutation C677T  Z15.89     8. 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 Earnie Larsson  RECOMMENDATIONS:  PDMP was reviewed.  Klonopin last filled 08/06/2022. I provided 32 minutes of face to face time during this encounter, including time spent  before and after the visit in records review, medical decision making, counseling pertinent to today's visit, and charting.   There have been concerns in the past about him taking medications as directed, he brought medications in today for my review.  It appears that he is taking all meds as prescribed.  I recommend increasing the Luvox dose.  This will help with the depression, anxiety, and hopefully sleep.  Verbal and written instructions given to patient.    Sleep hygiene discussed.  Importance of using CPAP stressed, not only for his mental health but also for treatment of secondary erythrocytosis.  Recommend healthy diet and 30 minutes of exercise daily, go for a walk at least to get fresh air.   Continue Klonopin 1 mg, 1 p.o. every morning, 1/2 pill midday, and continue evening dose,1/2 pill qhs.  Increase Luvox 100 mg to 1.5 pills nightly. Continue Lamictal 150 mg, 1 p.o. daily. Continue lithium 150 mg, 1 p.o. twice daily.   Recommend multivitamin, B complex, folic acid, thiamine, and B12.  Deplin  discussed, he is not on it due to cost. Continue therapy with Dr. Earnie Larsson.  Return in 4 weeks.  Melony Overly, PA-C

## 2022-08-31 ENCOUNTER — Ambulatory Visit: Payer: Medicare Other | Admitting: Physician Assistant

## 2022-09-01 ENCOUNTER — Encounter: Payer: Self-pay | Admitting: Podiatry

## 2022-09-01 ENCOUNTER — Ambulatory Visit (INDEPENDENT_AMBULATORY_CARE_PROVIDER_SITE_OTHER): Payer: Medicare Other | Admitting: Podiatry

## 2022-09-01 DIAGNOSIS — M7662 Achilles tendinitis, left leg: Secondary | ICD-10-CM | POA: Diagnosis not present

## 2022-09-01 MED ORDER — TRIAMCINOLONE ACETONIDE 10 MG/ML IJ SUSP
10.0000 mg | Freq: Once | INTRAMUSCULAR | Status: AC
Start: 2022-09-01 — End: 2022-09-01
  Administered 2022-09-01: 10 mg via INTRA_ARTICULAR

## 2022-09-01 NOTE — Progress Notes (Signed)
Subjective:   Patient ID: Richard Wilson, male   DOB: 72 y.o.   MRN: [id number]   HPI Patient presents stating I do not remember being here 4 months ago and getting pain in the back of my heel left and states I thought before it was more underneath   ROS      Objective:  Physical Exam  Neurovascular status intact with inflammation of the lateral posterior heel plantar heel doing quite a bit better than it was with inflammation at the insertion into the calcaneus posterior lateral     Assessment:  Probability for reoccurrence Achilles tendinitis with plantar fasciitis that is improved with patient that has been ambulatory     Plan:  H&P reviewed I would like again to do conservative is I like to avoid surgery with age and other conditions even though it may be necessary.  I did explain 1 more careful injection I explained risk of rupture with this he wants to do this and to further prevent issues and to try to solve the problem I am dispensing air fracture walker properly fitted to his lower leg.  Patient had careful sterile injection of the lateral side Achilles insertion avoiding central medial 3 mg dexamethasone Kenalog 5 mg Xylocaine advised on reduced activity complete immobilization.  Reappoint 4 weeks

## 2022-09-01 NOTE — Patient Instructions (Signed)

## 2022-09-16 ENCOUNTER — Other Ambulatory Visit: Payer: Self-pay | Admitting: Physician Assistant

## 2022-09-17 NOTE — Telephone Encounter (Signed)
Please call to schedule an appt  

## 2022-09-17 NOTE — Telephone Encounter (Signed)
Note on an appt cancellation says that he found another provider

## 2022-09-22 ENCOUNTER — Other Ambulatory Visit: Payer: Self-pay | Admitting: Physician Assistant

## 2022-09-29 ENCOUNTER — Ambulatory Visit: Payer: Medicare Other | Admitting: Physician Assistant

## 2022-09-29 ENCOUNTER — Ambulatory Visit: Payer: Medicare Other | Admitting: Podiatry

## 2022-10-01 ENCOUNTER — Other Ambulatory Visit: Payer: Self-pay | Admitting: Physician Assistant

## 2022-10-08 ENCOUNTER — Inpatient Hospital Stay: Payer: Medicare Other

## 2022-10-08 ENCOUNTER — Inpatient Hospital Stay: Payer: Medicare Other | Attending: Hematology and Oncology

## 2022-10-08 DIAGNOSIS — D45 Polycythemia vera: Secondary | ICD-10-CM

## 2022-10-08 DIAGNOSIS — D751 Secondary polycythemia: Secondary | ICD-10-CM | POA: Diagnosis present

## 2022-10-08 LAB — CBC WITH DIFFERENTIAL/PLATELET
Abs Immature Granulocytes: 0.02 10*3/uL (ref 0.00–0.07)
Basophils Absolute: 0.1 10*3/uL (ref 0.0–0.1)
Basophils Relative: 1 %
Eosinophils Absolute: 0.2 10*3/uL (ref 0.0–0.5)
Eosinophils Relative: 3 %
HCT: 54.6 % — ABNORMAL HIGH (ref 39.0–52.0)
Hemoglobin: 18.4 g/dL — ABNORMAL HIGH (ref 13.0–17.0)
Immature Granulocytes: 0 %
Lymphocytes Relative: 22 %
Lymphs Abs: 1.2 10*3/uL (ref 0.7–4.0)
MCH: 29.6 pg (ref 26.0–34.0)
MCHC: 33.7 g/dL (ref 30.0–36.0)
MCV: 87.8 fL (ref 80.0–100.0)
Monocytes Absolute: 0.6 10*3/uL (ref 0.1–1.0)
Monocytes Relative: 10 %
Neutro Abs: 3.5 10*3/uL (ref 1.7–7.7)
Neutrophils Relative %: 64 %
Platelets: 178 10*3/uL (ref 150–400)
RBC: 6.22 MIL/uL — ABNORMAL HIGH (ref 4.22–5.81)
RDW: 13.7 % (ref 11.5–15.5)
WBC: 5.5 10*3/uL (ref 4.0–10.5)
nRBC: 0 % (ref 0.0–0.2)

## 2022-10-08 NOTE — Patient Instructions (Signed)

## 2022-10-08 NOTE — Progress Notes (Signed)
Finesse Shead presents today for phlebotomy per MD orders. Phlebotomy procedure to L AC with 18G started at 1405 and ended at 1415. 512 grams removed. Patient observed for 30 minutes after procedure without any incident. Food and drink provided. Patient tolerated procedure well. IV needle removed intact.

## 2022-10-12 ENCOUNTER — Ambulatory Visit: Payer: Medicare Other | Admitting: Physician Assistant

## 2022-10-26 ENCOUNTER — Telehealth: Payer: Self-pay | Admitting: Family Medicine

## 2022-10-26 NOTE — Telephone Encounter (Signed)
Spoke to patient to schedule AWV.  He stated he changed pcp   he sees Dr Renae Fickle now

## 2022-10-30 ENCOUNTER — Encounter (HOSPITAL_COMMUNITY): Payer: Self-pay

## 2022-10-30 ENCOUNTER — Ambulatory Visit (HOSPITAL_COMMUNITY)
Admission: EM | Admit: 2022-10-30 | Discharge: 2022-10-30 | Disposition: A | Discharge status: Home / Self Care | Payer: Medicare Other | Attending: Physician Assistant | Admitting: Physician Assistant

## 2022-10-30 DIAGNOSIS — M25561 Pain in right knee: Secondary | ICD-10-CM | POA: Diagnosis not present

## 2022-10-30 MED ORDER — PREDNISONE 50 MG PO TABS: ORAL_TABLET | ORAL | 0 refills | Status: DC

## 2022-10-30 NOTE — ED Triage Notes (Signed)
Patient here today with c/o right knee pain since Monday. Pain radiates down lower leg. No known injury. Patient states that the pain is worsening and keeping him up at night. He has tried taking Tylenol and has been using Ice with minimal relief.

## 2022-10-30 NOTE — Discharge Instructions (Addendum)
Very good to meet you today.  I think this is most likely osteoarthritis pain flaring up.  Lets treat with prednisone 50 mg once daily x 5 days.  Take this with breakfast.  You may continue to take Tylenol arthritis.  Rest and stretch as you tolerate.  Follow-up with your primary care provider if not improving.

## 2022-10-30 NOTE — ED Provider Notes (Signed)
[facility]    CSN: [id number] Arrival date & time: 10/30/22  1004      History   Chief Complaint Chief Complaint  Patient presents with   Knee Pain    HPI Richard Wilson is a 72 y.o. male with right knee pain times approximately 1 week.  States that the pain is diffusely in his knee and radiates down the front and side of his shin.  He denies any injury.  He has been taking Tylenol arthritis and this has been helping somewhat.  He has also tried ice and heat with minimal relief.  Pain does not seem to be resolving and is keeping him up at night.  He has a history of osteoarthritis.  No swelling or redness.  No fever.  No new exercises.  He states that he does bend down on his knees often and wonders if this is flared up the pain.   Past Medical History:  Diagnosis Date   Arthritis of knee 08/31/2017   Chronic idiopathic constipation 10/23/2020   Chronic kidney disease, stage 3a 03/19/2019   Diastolic dysfunction 08/15/2013   Diverticulosis of large intestine without hemorrhage 09/30/2013   Elevated PSA 12/22/2012   Erectile dysfunction 10/23/2020   Generalized anxiety disorder 01/01/2020   Hyperglycemia 09/24/2020   Intention tremor    Major depressive disorder 01/01/2020   Mild cognitive impairment of uncertain or unknown etiology 01/21/2021   Mild mitral regurgitation 08/15/2013   Nephrolithiasis 06/22/2016   Obstructive sleep apnea 04/13/2015   Orthostatic lightheadedness 10/23/2020   Primary osteoarthritis of both hips 07/30/2019   Pure hypercholesterolemia 12/15/2018   RBBB (right bundle branch block) 12/03/2012   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   Thrombocytopenia 04/08/2020    Patient Active Problem List   Diagnosis Date Noted   Major depressive disorder, recurrent, severe without psychotic features (HCC) 03/19/2022   Gastroesophageal reflux disease without esophagitis 02/09/2022   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   Mild cognitive impairment of  uncertain or unknown etiology 01/21/2021   Chronic idiopathic constipation 10/23/2020   Erectile dysfunction 10/23/2020   Hyperglycemia 09/24/2020   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   Thrombocytopenia 04/08/2020   Generalized anxiety disorder 01/01/2020   Major depressive disorder 01/01/2020   Primary osteoarthritis of both hips 07/30/2019   Chronic kidney disease, stage 3a 03/19/2019   Hyperlipidemia 12/15/2018   Arthritis of knee 08/31/2017   Nephrolithiasis 06/22/2016   Obstructive sleep apnea 04/13/2015   Diverticulosis of large intestine without hemorrhage 09/30/2013   Mild mitral regurgitation 08/15/2013   Diastolic dysfunction 08/15/2013   Elevated PSA 12/22/2012   RBBB (right bundle branch block) 12/03/2012    Past Surgical History:  Procedure Laterality Date   APPENDECTOMY     FOOT TENDON SURGERY Right    TONSILLECTOMY         Home Medications    Prior to Admission medications   Medication Sig Start Date End Date Taking? Authorizing Provider  predniSONE (DELTASONE) 50 MG tablet Take one tablet po qd x 5 days with food. 10/30/22  Yes Kerstin Crusoe, Crist Infante, PA-C  aspirin EC 81 MG tablet Take 81 mg by mouth daily. Swallow whole.    [provider]  clonazePAM (KLONOPIN) 1 MG tablet TAKE 1 TABLET BY MOUTH IN THE MORNING, 1/2 TABLET MIDDAY, 1/2 TABLET AT BEDTIME. 06/11/22   Melony Overly T, PA-C  DULoxetine (CYMBALTA) 30 MG capsule Take 30 mg by mouth 2 (two) times daily. 09/22/22   [provider]  hydrocortisone 2.5 %  cream Apply 1 Application topically as needed (itching). 09/21/21   [provider]  Probiotic Product (DAILY PROBIOTIC PO) Take 1 capsule by mouth daily.    [provider]    Family History Family History  Problem Relation Age of Onset   Cancer Mother        Ovarian   Heart disease Mother    Memory loss Mother        with advanced age   Prostate cancer Father    Throat cancer  Maternal Grandfather    Heart attack Paternal Grandfather    Cancer Maternal Aunt        Breast, metastatic    Social History Social History   Tobacco Use   Smoking status: Never   Smokeless tobacco: Never  Vaping Use   Vaping status: Never Used  Substance Use Topics   Alcohol use: Not Currently    Comment: wine   Drug use: Never     Allergies   Gentamicin and Penicillins   Review of Systems Review of Systems SEE HPI  Physical Exam Triage Vital Signs ED Triage Vitals  Encounter Vitals Group     BP 10/30/22 1025 126/87     Systolic BP Percentile --      Diastolic BP Percentile --      Pulse Rate 10/30/22 1025 72     Resp 10/30/22 1025 16     Temp 10/30/22 1025 (!) 97.5 F (36.4 C)     Temp Source 10/30/22 1025 Oral     SpO2 10/30/22 1025 96 %     Weight 10/30/22 1025 181 lb (82.1 kg)     Height 10/30/22 1025 5\' 9"  (1.753 m)     Head Circumference --      Peak Flow --      Pain Score 10/30/22 1024 9     Pain Loc --      Pain Education --      Exclude from Growth Chart --    No data found.  Updated Vital Signs BP 126/87 (BP Location: Left Arm)   Pulse 72   Temp (!) 97.5 F (36.4 C) (Oral)   Resp 16   Ht 5\' 9"  (1.753 m)   Wt 181 lb (82.1 kg)   SpO2 96%   BMI 26.73 kg/m    Physical Exam Vitals and nursing note reviewed.  Constitutional:      Appearance: Normal appearance.  Cardiovascular:     Rate and Rhythm: Normal rate and regular rhythm.  Pulmonary:     Effort: Pulmonary effort is normal.     Breath sounds: Normal breath sounds.  Musculoskeletal:        General: No swelling or tenderness. Normal range of motion.     Right knee: No swelling, deformity, effusion, erythema or ecchymosis. Normal range of motion. No tenderness. No ACL laxity. Normal pulse.     Instability Tests: Anterior drawer test negative. Posterior drawer test negative. Anterior Lachman test negative. Medial McMurray test negative and lateral McMurray test negative.      Right lower leg: No edema.     Left lower leg: No edema.  Skin:    Findings: No lesion or rash.  Neurological:     Mental Status: He is alert.     Sensory: No sensory deficit.     Motor: No weakness.     Coordination: Coordination normal.     Gait: Gait normal.  Psychiatric:        Mood and  Affect: Mood normal.      UC Treatments / Results  Labs (all labs ordered are listed, but only abnormal results are displayed) Labs Reviewed - No data to display  EKG   Radiology No results found.  Procedures Procedures (including critical care time)  Medications Ordered in UC Medications - No data to display  Initial Impression / Assessment and Plan / UC Course  I have reviewed the triage vital signs and the nursing notes.  Pertinent labs & imaging results that were available during my care of the patient were reviewed by me and considered in my medical decision making (see chart for details).     No red flags noted on exam.  No known injury, shared decision making, we agreed to hold on imaging at this time.  Especially knowing his history of osteoarthritis of other sites, this is likely an acute flareup of arthritis pain.  He has done well with prednisone in the past.  He is not diabetic.  Plan to treat conservatively with prednisone 50 mg once daily x 5 days at this time.  He can continue Tylenol arthritis.  Heat and regular exercise as he is tolerated.  He can follow-up with primary care or sports medicine or Ortho if not improving.  Final Clinical Impressions(s) / UC Diagnoses   Final diagnoses:  Acute pain of right knee     Discharge Instructions      Very good to meet you today.  I think this is most likely osteoarthritis pain flaring up.  Lets treat with prednisone 50 mg once daily x 5 days.  Take this with breakfast.  You may continue to take Tylenol arthritis.  Rest and stretch as you tolerate.  Follow-up with your primary care provider if not improving.     ED  Prescriptions     Medication Sig Dispense Auth. Provider   predniSONE (DELTASONE) 50 MG tablet Take one tablet po qd x 5 days with food. 5 tablet Tya Haughey M, PA-C      I have reviewed the PDMP during this encounter.   AllwardtCrist Infante, PA-C 10/30/22 1111

## 2022-11-07 ENCOUNTER — Other Ambulatory Visit: Payer: Self-pay | Admitting: Physician Assistant

## 2022-11-23 ENCOUNTER — Institutional Professional Consult (permissible substitution): Payer: Medicare Other | Admitting: Psychology

## 2022-11-23 ENCOUNTER — Ambulatory Visit (INDEPENDENT_AMBULATORY_CARE_PROVIDER_SITE_OTHER): Payer: Medicare Other | Admitting: Psychology

## 2022-11-23 ENCOUNTER — Ambulatory Visit: Payer: Medicare Other | Admitting: Psychology

## 2022-11-23 ENCOUNTER — Encounter: Payer: Self-pay | Admitting: Psychology

## 2022-11-23 DIAGNOSIS — G251 Drug-induced tremor: Secondary | ICD-10-CM

## 2022-11-23 DIAGNOSIS — G3184 Mild cognitive impairment, so stated: Secondary | ICD-10-CM

## 2022-11-23 DIAGNOSIS — F411 Generalized anxiety disorder: Secondary | ICD-10-CM

## 2022-11-23 DIAGNOSIS — F332 Major depressive disorder, recurrent severe without psychotic features: Secondary | ICD-10-CM | POA: Diagnosis not present

## 2022-11-23 DIAGNOSIS — R4189 Other symptoms and signs involving cognitive functions and awareness: Secondary | ICD-10-CM

## 2022-11-23 NOTE — Progress Notes (Unsigned)
NEUROPSYCHOLOGICAL EVALUATION Richard Wilson. [facility] [place] Department of Neurology  Date of Evaluation: November 23, 2022  Reason for Referral:   Richard Wilson is a 72 y.o. right-handed Caucasian male referred by Richard Kays, PA-C, to characterize his current cognitive functioning and assist with diagnostic clarity and treatment planning in the context of a previously diagnosed mild neurocognitive disorder, numerous psychiatric comorbidities, and concerns for progressive cognitive decline.   Assessment and Plan:   Clinical Impression(s): [name] pattern of performance is suggestive of impairment surrounding basic sentence repetition, phonemic fluency, and encoding (i.e., learning) aspects of verbal memory tasks. Further performance variability was exhibited across executive functioning and both delayed retrieval and recognition/consolidation aspects of memory. Performances were appropriate relative to age-matched peers across processing speed, attention/concentration, receptive language, semantic fluency, confrontation naming, and visuospatial abilities. [name] denied difficulties completing instrumental activities of daily living (ADLs) independently. 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  Relative to his previous evaluation in December 2022, subtle to mild improvements were exhibited across numerous cognitive domains. This was most notable across cognitive flexibility, receptive language, and semantic fluency. However, mild improvements were also exhibited across processing speed and visuospatial abilities. Remaining cognitive domains exhibited relative stability. No cognitive domain exhibited appreciable cognitive decline.  The etiology for ongoing dysfunction continues to be multifactorial in nature. [name]  reported acute symptoms of severe depression and severe anxiety during the past 1-2 weeks across mood-related questionnaires. This mirrors his description of current psychological functioning during interview. He also described ongoing sleep dysfunction, including untreated obstructive sleep apnea, and chronic pain symptoms involving his hips and knees. Especially when combined, these variables will certainly negatively impact cognitive functioning. [name] pattern of weakness seen across the current evaluation also aligns reasonably well with expectations. Medication side effects from Klonopin/clonazepam use will also exacerbate ongoing deficits. The combination of these variables represents the most likely culprit for his current clinical presentation.   While variable, I do not believe that current memory performances offer compelling evidence to suggest early stages of Alzheimer's disease, especially given the contribution of the aforementioned variables. Additionally, given testing suggesting improvement rather than progressive decline over time, the presence of any neurodegenerative illness appears less likely given the benefit of repeat testing. He continues to not display behavioral features of Lewy body disease, another more rare parkinsonian presentation, or frontotemporal lobar dementia. His most recent brain MRI (2023) also suggested minimal cerebrovascular disease.   Recommendations: A combination of medication and psychotherapy has been shown to be most effective at treating symptoms of anxiety and depression. As such, [name] is encouraged to speak with his prescribing physician regarding medication adjustments to optimally manage these symptoms. The continued tapering off of Klonopin/clonazepam should further be beneficial with regard to his cognitive functioning.   Likewise, [name] is encouraged to continue engagement in short-term psychotherapy to address symptoms of psychiatric  distress. He would benefit from an active and collaborative therapeutic environment, rather than one purely supportive in nature. Recommended treatment modalities include Cognitive Behavioral Therapy (CBT) or Acceptance and Commitment Therapy (ACT).  While he denied ongoing symptoms, his medical records do report a history of obstructive sleep apnea. If this condition is present and untreated, this will also worsen cognitive functioning. It will further increase his risk for heart attack, stroke, and dementia. If present, he is encouraged to adhere to  treatment recommendations surrounding this condition.  Performance across neurocognitive testing is not a strong predictor of an individual's safety operating a motor vehicle. Should his family wish to pursue a formalized driving evaluation, they could reach out to the following agencies: [facility] [employer] [facility] in [place]: [phone] [facility]: [phone] [facility]: [phone] [facility]: [phone] or [phone]  [name] is encouraged to attend to lifestyle factors for brain health (e.g., regular physical exercise, good nutrition habits and consideration of the MIND-DASH diet, regular participation in cognitively-stimulating activities, and general stress management techniques), which are likely to have benefits for both emotional adjustment and cognition. In fact, in addition to promoting good general health, regular exercise incorporating aerobic activities (e.g., brisk walking, jogging, cycling, etc.) has been demonstrated to be a very effective treatment for depression and stress, with similar efficacy rates to both antidepressant medication and psychotherapy. Optimal control of vascular risk factors (including safe cardiovascular exercise and adherence to dietary recommendations) is encouraged. Continued participation in activities which provide mental stimulation and social interaction is also  recommended.   If interested, there are some activities which have therapeutic value and can be useful in keeping him cognitively stimulated. For suggestions, Mr. Eary is encouraged to go to the following website: https://www.[facility].org/get-to-know-barrow/centers-programs/neurorehabilitation-center/neuro-rehab-apps-and-games/ which has options, categorized by level of difficulty. It should be noted that these activities should not be viewed as a substitute for therapy.  Memory can be improved using internal strategies such as rehearsal, repetition, chunking, mnemonics, association, and imagery. External strategies such as written notes in a consistently used memory journal, visual and nonverbal auditory cues such as a calendar on the refrigerator or appointments with alarm, such as on a cell phone, can also help maximize recall.    When learning new information, he would benefit from information being broken up into small, manageable pieces. he may also find it helpful to articulate the material in his own words and in a context to promote encoding at the onset of a new task. This material may need to be repeated multiple times to promote encoding.  To address problems with fluctuating attention and/or executive dysfunction, he may wish to consider:   -Avoiding external distractions when needing to concentrate   -Limiting exposure to fast paced environments with multiple sensory demands   -Writing down complicated information and using checklists   -Attempting and completing one task at a time (i.e., no multi-tasking)   -Verbalizing aloud each step of a task to maintain focus   -Taking frequent breaks during the completion of steps/tasks to avoid fatigue   -Reducing the amount of information considered at one time   -Scheduling more difficult activities for a time of day where he is usually most alert  Review of Records:   Mr. Sandau was seen by [facility] Lurena Joiner Tat, D.O.) on  01/08/2021 for follow-up of ongoing tremor. 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 Per Dr. Arbutus Leas, parkinsonian features have improved since being taken off this medication. Mr. Dedrick had expressed concerns that tremors were due to a prior behavioral health clinician tapering him off clonazepam and him experiencing withdrawal symptoms. He was eventually re-started on and was taking this medication (1 mg, three times per day). In her neurological exam, Dr. Arbutus Leas noted normal tone in upper and lower extremities bilaterally. No resting, postural, or intention tremor was observed. There was a tremor in the right hand when pouring water (some entrainment). There were no  decremation with RAMs or difficulty with archimedes spirals. Ultimately Dr. Arbutus Leas continued to express concerns that tremors were due to medication side effects (as well as a potential B12 deficiency contribution). During this appointment, Mr. Sahagian also expressed concerns surrounding short-term memory and expressive language. Ultimately, Mr. Rizk was referred for a comprehensive neuropsychological evaluation to characterize his cognitive abilities and to assist with diagnostic clarity and treatment planning.   He completed a comprehensive neuropsychological evaluation with myself on 01/21/2021. Results suggested a few somewhat isolated and non-specific impairments. These included cognitive flexibility, performance on a simulated bill paying task, encoding (i.e., learning) aspects of verbal memory, and delayed retrieval/recognition across a list-based memory task. An additional weakness was exhibited across processing speed. While a normative weakness was exhibited across a receptive language task, he only missed a few items overall, making this less likely to be a current functional impairment. Performances were adequate when compared to age-matched peers across domains of  attention/concentration, abstract reasoning, safety/judgment, expressive language, visuospatial abilities, and both story and figure-based retrieval/recognition aspects of memory. Functional concerns were denied and he was diagnosed with a mild neurocognitive disorder. The etiology was unclear at that time. Difficulties were said to possibly relate to a combination of medication side effects, moderate to severe psychiatric distress, and acute testing anxiety. Repeat testing was recommended.  Mr. Boas was most recently seen by [facility] Richard Kays, PA-C) for follow-up on 01/11/2022. He reported some potential mild cognitive decline, largely surrounding word finding concerns and expressive language. He also noted entering rooms and forgetting his intention. ADL dysfunction was denied. Performance on a brief cognitive screening instrument (MMSE) was 28/30. Ultimately, Mr. Fullmer was referred for a repeat neuropsychological evaluation to characterize his cognitive abilities and to assist with diagnostic clarity and treatment planning.   Neuroimaging Brain MRI on 02/09/2021 revealed minimal volume loss, minimal microvascular ischemic disease, and a nonspecific chronic microhemorrhage within the lateral left occipital lobe.   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   Elevated PSA 12/22/2012   Erectile dysfunction 10/23/2020   Gastroesophageal reflux disease without esophagitis 02/09/2022   Generalized anxiety disorder 01/01/2020   Hyperglycemia 09/24/2020   Hyperlipidemia 12/15/2018   Major depressive disorder 01/01/2020   Mild cognitive impairment of uncertain or unknown etiology 01/21/2021   Mild mitral  regurgitation 08/15/2013   Nephrolithiasis 06/22/2016   Obstructive sleep apnea 04/13/2015   no CPAP   Orthostatic lightheadedness 10/23/2020   Primary osteoarthritis of both hips 07/30/2019   Pure hypercholesterolemia 12/15/2018   RBBB (right bundle branch block) 12/03/2012   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 Swallow whole., Disp: , Rfl:    clonazePAM (KLONOPIN) 1 MG tablet, TAKE 1 TABLET BY MOUTH IN THE MORNING, 1/2 TABLET MIDDAY, 1/2 TABLET AT BEDTIME., Disp: 60 tablet, Rfl: 2   DULoxetine (CYMBALTA) 30 MG capsule, Take 30 mg by mouth 2 (two) times daily., Disp: , Rfl:    hydrocortisone 2.5 % cream, Apply 1 Application topically as  needed (itching)., Disp: , Rfl:    predniSONE (DELTASONE) 50 MG tablet, Take one tablet po qd x 5 days with food., Disp: 5 tablet, Rfl: 0   Probiotic Product (DAILY PROBIOTIC PO), Take 1 capsule by mouth daily., Disp: , Rfl:   Clinical Interview:   The following information was obtained during a clinical interview with Mr. Ficke prior to cognitive testing.  Cognitive Symptoms: Decreased short-term memory: Endorsed. He primarily described generalized concerns with more specific examples surrounding him commonly entering rooms and forgetting his original intention, as well as trouble with name recollection. Difficulties were said to have possibly progressed relative to his previous evaluation in December 2022.  Decreased long-term memory: Denied. Decreased attention/concentration: Endorsed. He specifically identified increased distractibility as an area of concern.  Reduced processing speed: Denied.  Difficulties with executive functions: Endorsed. He specifically identified difficulties with multi-tasking. He denied  trouble with impulsivity as well as any significant personality changes.  Difficulties with emotion regulation: Denied. Difficulties with receptive language: Denied. Difficulties with word finding: Endorsed. He previously noted slowing his speech and speaking "very distinctly" where he can make very deliberate word choices. This was done to limit mistakes, such as him saying an unintended word in the middle of a sentence which is somewhat similar in sound but entirely different in meaning. Similar concerns were again described and have persisted since his previous evaluation. He did express some concern surrounding a progressive worsening.  Decreased visuoperceptual ability: Denied.   Difficulties completing ADLs: Denied.  Additional Medical History: History of traumatic brain injury/concussion: Denied. History of stroke: Denied. History of seizure activity: Denied. History of known exposure to toxins: Denied. Symptoms of chronic pain: Denied. Medical records do suggest a history of hip and knee pain.  Experience of frequent headaches/migraines: Denied. Frequent instances of dizziness/vertigo: Denied.   Sensory changes: He wears glasses with benefit. Other sensory changes/difficulties (e.g., hearing, taste, or smell) was denied.  Balance/coordination difficulties: Endorsed. He previously noted some mild balance instability where he will bump into objects in his environment unintentionally. One side of the body was not said to be worse than the other. He reported his last fall occurring in December 2021 and that this was not serious. No more recent falls were reported.  Other motor difficulties: Endorsed. Mild tremors were previously said to be present in his right hand. He was unsure if they had improved or declined over time and felt that they were possibly due to medication side effects.  Sleep History: Estimated hours obtained each night: 6-8 hours.  Difficulties falling asleep: Somewhat.  He was previously working with his therapist on sleep hygiene practices such as not napping during the day or not viewing his PC screen immediately before bed. This has seemed to be helpful. More recently, he noted that medication adjustments surrounding melatonin and trazodone were also helpful in initiating sleep. Difficulties staying asleep: Somewhat. Feels rested and refreshed upon awakening: "Not always."   History of snoring: Denied. History of waking up gasping for air: Denied. Witnessed breath cessation while asleep: Denied. Medical records do suggest a history of obstructive sleep apnea and that he does not utilize a CPAP machine due to mask discomfort.    History of vivid dreaming: Denied.  Excessive movement while asleep: Denied. Instances of acting out his dreams: Denied.  Psychiatric/Behavioral Health History: Depression: He previously acknowledged a longstanding history of depression which has vacillated from mild to severe presentations. He attempted suicide in 1996 and reported having several "nervous breakdowns" throughout  his life, requiring brief psychiatric hospitalizations. He described his current mood as "very depressed, very angry" and noted that he was scheduled to begin work with a new therapist on 11/24/2022. Current suicidal ideation, intent, or plan was denied.  Anxiety: Endorsed. He previously reported generalized anxious distress which has also vacillated from mild to severe presentations over the course of many years. He acknowledged a tendency to catastrophize when discussing concerns. Mania: Denied. Trauma History: Denied. Visual/auditory hallucinations: Denied. Delusional thoughts: Denied.   Tobacco: Denied. Alcohol: He reported consuming an occasional glass of sherry and denied a history of problematic alcohol abuse or dependence.  Recreational drugs: Denied.  Family History: Problem Relation Age of Onset   Cancer Mother        Ovarian   Heart disease  Mother    Memory loss Mother        with advanced age   Prostate cancer Father    Throat cancer Maternal Grandfather    Heart attack Paternal Grandfather    Cancer Maternal Aunt        Breast, metastatic   This information was confirmed by Mr. Saxon.  Academic/Vocational History: Highest level of educational attainment: 18 years. 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 No relative weaknesses were identified.  History of developmental delay: Denied. History of grade repetition: Denied. Enrollment in special education courses: Denied. History of LD/ADHD: Denied.   Employment: Retired. He previously worked as a Psychologist, educational at [employer]. He went on disability in 2001 due to psychiatric symptoms.   Evaluation Results:   Behavioral Observations: Mr. Womble was unaccompanied, arrived to his appointment 75 minutes early, and was appropriately dressed and groomed. He appeared alert and oriented. Observed gait and station were within normal limits. Gross motor functioning appeared intact upon informal observation and no abnormal movements (e.g., tremors) were noted. His affect was generally relaxed and positive, but did range appropriately given the subject being discussed. Spontaneous speech was mildly pressured but fluent overall. Prominent word finding difficulties were not observed during the clinical interview. Thought processes were coherent, organized, and normal in content. Insight into his cognitive difficulties appeared adequate.   During testing, sustained attention was appropriate. Task engagement was adequate and he persisted when challenged. Overall, Mr. Hepburn was cooperative with the clinical interview and subsequent testing procedures.   Adequacy of Effort: The validity of neuropsychological testing is limited by the extent to which the individual being tested may be assumed to have exerted  adequate effort during testing. Mr. Rostad expressed his intention to perform to the best of his abilities and exhibited adequate task engagement and persistence. Scores across stand-alone and embedded performance validity measures were within expectation. As such, the results of the current evaluation are believed to be a valid representation of Mr. Guijarro current cognitive functioning.  Test Results: Mr. Placek was oriented at the time of the current evaluation.  Intellectual abilities based upon educational and vocational attainment were estimated to be in the average to above average range. Premorbid abilities were estimated to be within the above average range based upon a single-word reading test.   Processing speed was average to well above average. Basic attention was below average to average. More complex attention (e.g., working memory) was average. Executive functioning was variable, ranging from the well below average to average normative ranges.  Assessed receptive language abilities were average. Likewise, Mr. Leak did not exhibit any difficulties comprehending task  instructions and answered all questions asked of him appropriately. Assessed expressive language was somewhat variable. Sentence repetition and phonemic fluency were well below average. Semantic fluency, confrontation naming, and writing samples were average to above average.   Assessed visuospatial/visuoconstructional abilities were mildly variable but overall appropriate, ranging from the below average to above average normative ranges.    Learning (i.e., encoding) of novel verbal information was exceptionally low to well below average. Spontaneous delayed recall (i.e., retrieval) of previously learned information was variable, ranging from the exceptionally low to average normative ranges. Retention rates were 67% (raw score of 2) across a story learning task, 60% across a list learning task, and 74% across a figure  drawing task. Performance across recognition tasks was variable, ranging from the well below average to average normative ranges, suggesting some evidence for information consolidation.   Results of emotional screening instruments suggested that recent symptoms of generalized anxiety were in the severe range, while symptoms of depression were also within the severe range. A screening instrument assessing recent sleep quality suggested the presence of mild sleep dysfunction.  Tables of Scores:   Note: This summary of test scores accompanies the interpretive report and should not be considered in isolation without reference to the appropriate sections in the text. Descriptors are based on appropriate normative data and may be adjusted based on clinical judgment. Terms such as "Within Normal Limits" and "Outside Normal Limits" are used when a more specific description of the test score cannot be determined. Descriptors refer to the current evaluation only.        Percentile - Normative Descriptor > 98 - Exceptionally High 91-97 - Well Above Average 75-90 - Above Average 25-74 - Average 9-24 - Below Average 2-8 - Well Below Average < 2 - Exceptionally Low        Validity: December 2022 Current  DESCRIPTOR        DCT: --- --- --- Within Normal Limits  RBANS EI: --- --- --- Within Normal Limits  WAIS-IV RDS: --- --- --- Within Normal Limits        Orientation:       Raw Score Raw Score Percentile   NAB Orientation, Form 1 29/29 28/29 --- ---        Cognitive Screening:       Raw Score Raw Score Percentile   SLUMS: 22/30 22/30 --- ---        RBANS, Form A: Standard Score/ Scaled Score Standard Score/ Scaled Score Percentile   Total Score 69 80 9 Below Average  Immediate Memory 57 61 <1 Exceptionally Low    List Learning 2 4 2  Well Below Average    Story Memory 4 3 1  Exceptionally Low  Visuospatial/Constructional 89 96 39 Average    Figure Copy 10 12 75 Above Average    Line  Orientation 14/20 13/20 10-16 Below Average  Language 87 99 47 Average    Picture Naming 10/10 10/10 51-75 Average    Semantic Fluency 5 9 37 Average  Attention 75 85 16 Below Average    Digit Span 7 7 16  Below Average    Coding 5 8 25  Average  Delayed Memory 71 81 10 Below Average    List Recall 2/10 3/10 17-25 Below Average  to Average    List Recognition 15/20 18/20 17-25 Below Average  to Average    Story Recall 9 3 1  Exceptionally Low    Story Recognition 10/12 6/12 3-4 Well Below Average    Figure Recall  8 11 63 Average    Figure Recognition 5/8 8/8 70+ Average         Intellectual Functioning:       Standard Score Standard Score Percentile   Test of Premorbid Functioning: 121 116 86 Above Average        Attention/Executive Function:      Trail Making Test (TMT): Raw Score (T Score) Raw Score (T Score) Percentile     Part A 61 secs.,  1 error (31) 38 secs.,  0 errors (49) 46 Average    Part B 300 secs.,  3 errors (<19) 144 secs.,  3 errors (35) 7 Well Below Average         Symbol Digit Modalities Test (SDMT): Raw Score (Z-Score) Raw Score (Z-Score) Percentile     Oral 35 (-1.32) 46 (-0.5) 32 Average         Scaled Score Scaled Score Percentile   WAIS-IV Digit Span: 8 8 25  Average    Forward 8 8 25  Average    Backward 9 8 25  Average    Sequencing 8 9 37 Average        D-KEFS Verbal Fluency Test: Raw Score (Scaled Score) Raw Score (Scaled Score) Percentile     Letter Total Correct 29 (8) 27 (8) 25 Average    Category Total Correct 31 (8) 46 (15) 95 Well Above Average    Category Switching Total Correct 2 (1) 12 (10) 50 Average    Category Switching Accuracy 1 (1) 11 (10) 50 Average      Total Set Loss Errors 0 (13) 0 (13) 84 Above Average      Total Repetition Errors 1 (12) 1 (12) 75 Above Average        Language:       Raw Score Raw Score Percentile   Sentence Repetition: 13/22 12/22 3 Well Below Average        Verbal Fluency Test: Raw Score (T Score)  Raw Score (T Score) Percentile     Phonemic Fluency (FAS) 29 (39) 27 (36) 8 Well Below Average    Animal Fluency 21 (52) 30 (60) 84 Above Average         NAB Language Module, Form 1: T Score T Score Percentile     Oral Production 54 51 54 Average    Auditory Comprehension 32 56 73 Average    Reading Comprehension 13/13 13/13 --- Within Normal Limits    Naming 31/31 (55) 31/31 (57) 75 Above Average    Writing 53 56 73 Average        Visuospatial/Visuoconstruction:       Raw Score Raw Score Percentile   Clock Drawing: 10/10 10/10 --- Within Normal Limits         Scaled Score Scaled Score Percentile   WAIS-IV Block Design: 8 9 37 Average  WAIS-IV Matrix Reasoning: 7 9 37 Average        Mood and Personality:       Raw Score Raw Score Percentile   Beck Depression Inventory - II: 28 38 --- Severe  PROMIS Anxiety Questionnaire: 31 30 --- Severe        Additional Questionnaires:       Raw Score Raw Score Percentile   PROMIS Sleep Disturbance Questionnaire: 18 26 --- Mild   Informed Consent and Coding/Compliance:   The current evaluation represents a clinical evaluation for the purposes previously outlined by the referral source and is in no way reflective of a forensic evaluation.  Mr. Bickers was provided with a verbal description of the nature and purpose of the present neuropsychological evaluation. Also reviewed were the foreseeable risks and/or discomforts and benefits of the procedure, limits of confidentiality, and mandatory reporting requirements of this provider. The patient was given the opportunity to ask questions and receive answers about the evaluation. Oral consent to participate was provided by the patient.   This evaluation was conducted by Newman Nickels, Ph.D., ABPP-CN, board certified clinical neuropsychologist. Mr. Malcomson completed a clinical interview with Dr. Milbert Coulter, billed as one unit [phone], and 125 minutes of cognitive testing and scoring, billed as one unit [phone]  and three additional units [id number]. Psychometrist Wallace Keller, B.S. assisted Dr. Milbert Coulter with test administration and scoring procedures. As a separate and discrete service, one unit [id number] and two units [phone] were billed for Dr. Tammy Sours time spent in interpretation and report writing.

## 2022-11-23 NOTE — Progress Notes (Signed)
   Psychometrician Note   Cognitive testing was administered to Richard Wilson by Richard Wilson, B.S. (psychometrist) under the supervision of Richard Wilson, Ph.D., licensed psychologist on 11/23/2022. Richard Wilson did not appear overtly distressed by the testing session per behavioral observation or responses across self-report questionnaires. Rest breaks were offered.    The battery of tests administered was selected by Richard Wilson, Ph.D. with consideration to Richard Wilson current level of functioning, the nature of his symptoms, emotional and behavioral responses during interview, level of literacy, observed level of motivation/effort, and the nature of the referral question. This battery was communicated to the psychometrist. Communication between Richard Wilson, Ph.D. and the psychometrist was ongoing throughout the evaluation and Richard Wilson, Ph.D. was immediately accessible at all times. Richard Wilson, Ph.D. provided supervision to the psychometrist on the date of this service to the extent necessary to assure the quality of all services provided.    Richard Wilson will return within approximately 1-2 weeks for an interactive feedback session with Richard Wilson at which time his test performances, clinical impressions, and treatment recommendations will be reviewed in detail. Richard Wilson understands he can contact our office should he require our assistance before this time.  A total of 125 minutes of billable time were spent face-to-face with Richard Wilson by the psychometrist. This includes both test administration and scoring time. Billing for these services is reflected in the clinical report generated by Richard Wilson, Ph.D.  This note reflects time spent with the psychometrician and does not include test scores or any clinical interpretations made by Richard Wilson. The full report will follow in a separate note.

## 2022-11-24 ENCOUNTER — Encounter: Payer: Medicare Other | Admitting: Psychology

## 2022-11-30 ENCOUNTER — Ambulatory Visit (INDEPENDENT_AMBULATORY_CARE_PROVIDER_SITE_OTHER): Payer: Medicare Other | Admitting: Psychology

## 2022-11-30 DIAGNOSIS — F411 Generalized anxiety disorder: Secondary | ICD-10-CM

## 2022-11-30 DIAGNOSIS — G3184 Mild cognitive impairment, so stated: Secondary | ICD-10-CM

## 2022-11-30 DIAGNOSIS — F332 Major depressive disorder, recurrent severe without psychotic features: Secondary | ICD-10-CM

## 2022-11-30 NOTE — Progress Notes (Signed)
   Neuropsychology Feedback Session Richard Wilson. [facility] [place] Department of Neurology  Reason for Referral:   Chrishawn Slauter is a 72 y.o. right-handed Caucasian male referred by Marlowe Kays, PA-C, to characterize his current cognitive functioning and assist with diagnostic clarity and treatment planning in the context of a previously diagnosed mild neurocognitive disorder, numerous psychiatric comorbidities, and concerns for progressive cognitive decline.   Feedback:   Mr. Kinas completed a comprehensive neuropsychological evaluation on 11/23/2022. Please refer to that encounter for the full report and recommendations. Briefly, results suggested impairment surrounding basic sentence repetition, phonemic fluency, and encoding (i.e., learning) aspects of verbal memory tasks. Further performance variability was exhibited across executive functioning and both delayed retrieval and recognition/consolidation aspects of memory. Relative to his previous evaluation in December 2022, subtle to mild improvements were exhibited across numerous cognitive domains. This was most notable across cognitive flexibility, receptive language, and semantic fluency. However, mild improvements were also exhibited across processing speed and visuospatial abilities. Remaining cognitive domains exhibited relative stability. No cognitive domain exhibited appreciable cognitive decline. The etiology for ongoing dysfunction continues to be multifactorial in nature. Mr. Finnin reported acute symptoms of severe depression and severe anxiety during the past 1-2 weeks across mood-related questionnaires. This mirrors his description of current psychological functioning during interview. He also described ongoing sleep dysfunction, including untreated obstructive sleep apnea, and chronic pain symptoms involving his hips and knees. Especially when combined, these variables will certainly negatively impact cognitive functioning.  Mr. Chaplain pattern of weakness seen across the current evaluation also aligns reasonably well with expectations. Medication side effects from Klonopin/clonazepam use will also exacerbate ongoing deficits. The combination of these variables represents the most likely culprit for his current clinical presentation. While variable, I do not believe that current memory performances offer compelling evidence to suggest early stages of Alzheimer's disease, especially given the contribution of the aforementioned variables and evidence for improvement over time.  Mr. Edmundson was unaccompanied during the current feedback session. Content of the current session focused on the results of his neuropsychological evaluation. Mr. Goldenstein was given the opportunity to ask questions and his questions were answered. He was encouraged to reach out should additional questions arise. A copy of his report was provided at the conclusion of the visit.      One unit [phone] was billed for Dr. Tammy Sours time spent preparing for, conducting, and documenting the current feedback session with Mr. Riquelme.

## 2022-12-01 ENCOUNTER — Encounter: Payer: Medicare Other | Admitting: Psychology

## 2023-01-07 ENCOUNTER — Inpatient Hospital Stay: Payer: Medicare Other

## 2023-01-07 ENCOUNTER — Inpatient Hospital Stay: Payer: Medicare Other | Attending: Hematology and Oncology

## 2023-01-07 VITALS — BP 116/92 | HR 70 | Temp 97.5°F | Resp 16

## 2023-01-07 DIAGNOSIS — D751 Secondary polycythemia: Secondary | ICD-10-CM

## 2023-01-07 DIAGNOSIS — D45 Polycythemia vera: Secondary | ICD-10-CM

## 2023-01-07 LAB — CBC WITH DIFFERENTIAL/PLATELET
Abs Immature Granulocytes: 0.01 10*3/uL (ref 0.00–0.07)
Basophils Absolute: 0.1 10*3/uL (ref 0.0–0.1)
Basophils Relative: 1 %
Eosinophils Absolute: 0.2 10*3/uL (ref 0.0–0.5)
Eosinophils Relative: 4 %
HCT: 51.9 % (ref 39.0–52.0)
Hemoglobin: 17 g/dL (ref 13.0–17.0)
Immature Granulocytes: 0 %
Lymphocytes Relative: 24 %
Lymphs Abs: 1.4 10*3/uL (ref 0.7–4.0)
MCH: 28 pg (ref 26.0–34.0)
MCHC: 32.8 g/dL (ref 30.0–36.0)
MCV: 85.5 fL (ref 80.0–100.0)
Monocytes Absolute: 0.6 10*3/uL (ref 0.1–1.0)
Monocytes Relative: 10 %
Neutro Abs: 3.6 10*3/uL (ref 1.7–7.7)
Neutrophils Relative %: 61 %
Platelets: 191 10*3/uL (ref 150–400)
RBC: 6.07 MIL/uL — ABNORMAL HIGH (ref 4.22–5.81)
RDW: 15 % (ref 11.5–15.5)
WBC: 5.8 10*3/uL (ref 4.0–10.5)
nRBC: 0 % (ref 0.0–0.2)

## 2023-01-07 NOTE — Progress Notes (Signed)
Patient came in for a therapeutic phlebotomy- qualifies per parameters with a hemoglobin of 17.0. VSS with no issues except some arthritis- see flowsheet. 18g placed on R FA. Started at 1409 until 1422- 510g removed. Patient tolerated well. Fluids and snacks offered.VSS post removal. VSS-  BP (!) 116/92 (BP Location: Left Wrist, Patient Position: Sitting)   Pulse 70   Temp (!) 97.5 F (36.4 C) (Oral)   Resp 16   SpO2 100%    Patient stayed for 18 minutes observation and then was ambulatory to the lobby.

## 2023-01-12 ENCOUNTER — Ambulatory Visit: Payer: Medicare Other | Admitting: Physician Assistant

## 2023-01-14 DIAGNOSIS — K5790 Diverticulosis of intestine, part unspecified, without perforation or abscess without bleeding: Secondary | ICD-10-CM | POA: Insufficient documentation

## 2023-01-14 DIAGNOSIS — R2689 Other abnormalities of gait and mobility: Secondary | ICD-10-CM | POA: Insufficient documentation

## 2023-01-18 ENCOUNTER — Ambulatory Visit (INDEPENDENT_AMBULATORY_CARE_PROVIDER_SITE_OTHER): Payer: Medicare Other | Admitting: Podiatry

## 2023-01-18 ENCOUNTER — Ambulatory Visit (INDEPENDENT_AMBULATORY_CARE_PROVIDER_SITE_OTHER): Payer: Medicare Other

## 2023-01-18 ENCOUNTER — Encounter: Payer: Self-pay | Admitting: Podiatry

## 2023-01-18 DIAGNOSIS — R52 Pain, unspecified: Secondary | ICD-10-CM

## 2023-01-18 DIAGNOSIS — M19071 Primary osteoarthritis, right ankle and foot: Secondary | ICD-10-CM | POA: Diagnosis not present

## 2023-01-18 DIAGNOSIS — M7989 Other specified soft tissue disorders: Secondary | ICD-10-CM

## 2023-01-18 DIAGNOSIS — M778 Other enthesopathies, not elsewhere classified: Secondary | ICD-10-CM

## 2023-01-18 NOTE — Progress Notes (Signed)
Subjective: Chief Complaint  Patient presents with   Toe Pain    RM#13 Right foot third toe blistered bump no actual pain in toe only if you hit the bump. Has been 2 months now and has not gone away.   72 year old male presents with the above concerns, which are now.  He states that the lesions started just over the last couple of months.  Only hurts with pressure or hits it.  No recent treatment.  Objective: AAO x3, NAD DP/PT pulses palpable bilaterally, CRT less than 3 seconds Along the DIPJ of the right third toe there is a fluid-filled soft tissue mass likely mucoid cyst.  Upon draining this, see plan below there is clear, yellow fluid.  No purulence.  There is no surrounding erythema, ascending size.  No crepitation. No pain with calf compression, swelling, warmth, erythema  Assessment: Soft tissue mass right third toe  Plan: -All treatment options discussed with the patient including all alternatives, risks, complications.  -X-rays obtained reviewed of the right foot. Arthritic changes noted.  No evidence of acute fracture. -We discussed both conservative as well as surgical intervention.  Will consider with conservative treatment.  I did discuss aspiration and she wants to proceed with this.  I cleaned the skin with alcohol and it punctured the cyst and clear, July fluids expressed.  I then infiltrated the the soft tissue mass with quarter cc dexamethasone phosphate, quarter cc Marcaine plain into the soft tissue lesion.  He tolerated well. -If symptoms persist consider surgical intervention. -Patient encouraged to call the office with any questions, concerns, change in symptoms.   Vivi Barrack DPM

## 2023-01-18 NOTE — Patient Instructions (Signed)

## 2023-03-07 ENCOUNTER — Other Ambulatory Visit: Payer: Self-pay | Admitting: Physician Assistant

## 2023-03-07 NOTE — Telephone Encounter (Signed)
Confirmed with pt, he is not taking these two medications.

## 2023-03-11 ENCOUNTER — Encounter: Payer: Self-pay | Admitting: Physician Assistant

## 2023-03-11 ENCOUNTER — Ambulatory Visit (INDEPENDENT_AMBULATORY_CARE_PROVIDER_SITE_OTHER): Payer: Medicare Other | Admitting: Physician Assistant

## 2023-03-11 VITALS — BP 121/83 | HR 74 | Wt 183.0 lb

## 2023-03-11 DIAGNOSIS — G3184 Mild cognitive impairment, so stated: Secondary | ICD-10-CM | POA: Diagnosis not present

## 2023-03-11 NOTE — Patient Instructions (Signed)
 It was a pleasure to see you today at our office.   Recommendations:  Follow up in 1 y  Neuropsych evaluation  Drink more water  Consider tapering Klonopin  off  Continue B12 supplements    Whom to call:  Memory  decline, memory medications: Call our office [phone]       For assessment of decision of mental capacity and competency:  Call Dr. Rosaline Nine, geriatric psychiatrist at [phone]  For guidance in geriatric dementia issues please call [facility] [phone]     If you have any severe symptoms of a stroke, or other severe issues such as confusion,severe chills or fever, etc call 911 or go to the ER as you may need to be evaluated further        RECOMMENDATIONS FOR ALL PATIENTS WITH MEMORY PROBLEMS: 1. Continue to exercise (Recommend 30 minutes of walking everyday, or 3 hours every week) 2. Increase social interactions - continue going to [place] and enjoy social gatherings with friends and family 3. Eat healthy, avoid fried foods and eat more fruits and vegetables 4. Maintain adequate blood pressure, blood sugar, and blood cholesterol level. Reducing the risk of stroke and cardiovascular disease also helps promoting better memory. 5. Avoid stressful situations. Live a simple life and avoid aggravations. Organize your time and prepare for the next day in anticipation. 6. Sleep well, avoid any interruptions of sleep and avoid any distractions in the bedroom that may interfere with adequate sleep quality 7. Avoid sugar, avoid sweets as there is a strong link between excessive sugar intake, diabetes, and cognitive impairment We discussed the Mediterranean diet, which has been shown to help patients reduce the risk of progressive memory disorders and reduces cardiovascular risk. This includes eating fish, eat fruits and green leafy vegetables, nuts like almonds and hazelnuts, walnuts, and also use olive oil. Avoid fast foods and fried foods as much as  possible. Avoid sweets and sugar as sugar use has been linked to worsening of memory function.  There is always a concern of gradual progression of memory problems. If this is the case, then we may need to adjust level of care according to patient needs. Support, both to the patient and caregiver, should then be put into place.    FALL PRECAUTIONS: Be cautious when walking. Scan the area for obstacles that may increase the risk of trips and falls. When getting up in the mornings, sit up at the edge of the bed for a few minutes before getting out of bed. Consider elevating the bed at the head end to avoid drop of blood pressure when getting up. Walk always in a well-lit room (use night lights in the walls). Avoid area rugs or power cords from appliances in the middle of the walkways. Use a walker or a cane if necessary and consider physical therapy for balance exercise. Get your eyesight checked regularly.  FINANCIAL OVERSIGHT: Supervision, especially oversight when making financial decisions or transactions is also recommended.  HOME SAFETY: Consider the safety of the kitchen when operating appliances like stoves, microwave oven, and blender. Consider having supervision and share cooking responsibilities until no longer able to participate in those. Accidents with firearms and other hazards in the house should be identified and addressed as well.   ABILITY TO BE LEFT ALONE: If patient is unable to contact 911 operator, consider using [employer], or when the need is there, arrange for someone to stay with patients. Smoking is a fire hazard, consider supervision or cessation.  Risk of wandering should be assessed by caregiver and if detected at any point, supervision and safe proof recommendations should be instituted.  MEDICATION SUPERVISION: Inability to self-administer medication needs to be constantly addressed. Implement a mechanism to ensure safe administration of the medications.   DRIVING:  Regarding driving, in patients with progressive memory problems, driving will be impaired. We advise to have someone else do the driving if trouble finding directions or if minor accidents are reported. Independent driving assessment is available to determine safety of driving.   If you are interested in the driving assessment, you can contact the following:  [facility] [employer] in [place] [phone]  [facility] [phone]  [facility] [phone]  [facility] [phone] or [phone]

## 2023-03-11 NOTE — Progress Notes (Signed)
 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 RH male with a history of with prior history of medically induced tremors, history of secondary polycythemia, and a diagnosis of memory loss of unclear etiology but likely combination of medication side effects, moderate to severe psychiatric distress and acute testing anxiety but repeat neuropsych evaluation October 2024 without indication of neurodegenerative disease, B12 deficiency, all other contributors presenting today in follow-up for evaluation of memory concerns..  Given that most recent neuropsych evaluation results, antidementia medication is still not indicated.  Of note, the patient is on Klonopin  which may affect the memory, discussed with the patient, he will talk with his therapist about this.  Recommendations:   Follow up in 1 year No indication for antidementia medication as memory remains stable Recommend good control of cardiovascular risk factors Continue to control mood as per PCP and psychiatry Continue B12 supplements    Subjective:   This patient is here alone. Previous records as well as any outside records available were reviewed prior to todays visit.   Patient was last seen on 01/11/2022    Any changes in memory since last visit? About the same Sometimes he confuses one word with another.  He reads frequently, does not do any crossword puzzles or word finding or any other brain games. repeats oneself?  Endorsed Disoriented when walking into a room?  Patient denies    Misplacing objects?  Sometimes  Wandering behavior?   denies   Any personality changes since last visit?  He has a history of high anxiety, sees a psychotherapist in [place].   He in on Klonopin    Any worsening depression?: denies   Hallucinations or paranoia?  denies   Seizures?   denies    Any sleep changes? Sleeps well .He  dreams frequently , REM behavior or  sleepwalking   Sleep apnea?   denies   Any hygiene concerns?   denies   Independent of bathing and dressing?  Endorsed  Does the patient needs help with medications? Patient is in charge   Who is in charge of the finances?  Patient is in charge     Any changes in appetite?  Denies.     Patient have trouble swallowing?  denies   Does the patient cook?  No  Any headaches?    denies   Vision changes? denies Chronic pain?  denies   Ambulates with difficulty?    Denies. Planning to mobilize more     Recent falls or head injuries?    denies      Unilateral weakness, numbness or tingling?   denies   Any tremors?  denies   Any anosmia?    denies   Any incontinence of urine?  denies   Any bowel dysfunction?  denies      Patient lives alone    Does the patient drive?  Yes he denies getting lost.    Personally reviewed MRI of the brain in January 2023 was remarkable for minimal parenchymal generalized volume loss, without acute intracranial abnormalities    Last visit June 2023 Richard Wilson is a very pleasant 73 y.o. RH male  seen today in follow up for memory loss. This patient is here alone  Previous records as well as any outside records available were reviewed prior to todays visit.   Any changes in memory since last visit? About the same.  He reports confusing one word with another,  Patient lives with: Patient lives alone repeats oneself?  Denies  Disoriented when walking into a room?  Patient denies   Leaving objects in unusual places?  Patient denies   Ambulates  with difficulty?   Patient denies   Recent falls?  Patient denies   Any head injuries?  Patient denies   History of seizures?   Patient denies   Wandering behavior?  Patient denies   Patient drives? Endorsed Any mood changes?  Patient denies.  He is under the care of behavioral health.  He is on multiple psychiatric medications, including clonazepam  1 mg 3 times daily (increased on 04/22/2021).  He reports to be more  withdrawn  Any worsening depression?:  Patient denies   Hallucinations?  Patient denies   Paranoia?  Patient denies   Patient reports that he sleeps well without vivid dreams, REM behavior or sleepwalking.  He takes 2 naps a day. Any hygiene concerns?  Patient denies   Independent of bathing and dressing?  Endorsed  Does the patient needs help with medications?  Patient in charge  who is in charge of the finances?  Patient is in charge  Any changes in appetite?  Patient denies   Patient have trouble swallowing? Patient denies   Does the patient cook?  Patient denies   Any kitchen accidents such as leaving the stove on? Patient denies   Any headaches?  Patient denies   The double vision? Patient denies   Any focal numbness or tingling?  Patient denies   Chronic back pain Patient denies   Unilateral weakness?  Patient denies   Any tremors?  Patient reports tremors, right greater than left.  However, during this visit, no tremors are noted.  He is on clonazepam , wonder if when he is off of clonazepam  the tremors appear.  He is off of Abilify  by now. Any history of anosmia?  Patient denies   Any incontinence of urine?  Patient denies   Any bowel dysfunction?   Patient denies       Neuropsychological evaluation 11/23/2022  Briefly, results suggested impairment surrounding basic sentence repetition, phonemic fluency, and encoding (i.e., learning) aspects of verbal memory tasks. Further performance variability was exhibited across executive functioning and both delayed retrieval and recognition/consolidation aspects of memory. Relative to his previous evaluation in December 2022, subtle to mild improvements were exhibited across numerous cognitive domains. This was most notable across cognitive flexibility, receptive language, and semantic fluency. However, mild improvements were also exhibited across processing speed and visuospatial abilities. Remaining cognitive domains exhibited relative  stability. No cognitive domain exhibited appreciable cognitive decline. The etiology for ongoing dysfunction continues to be multifactorial in nature. Richard Wilson reported acute symptoms of severe depression and severe anxiety during the past 1-2 weeks across mood-related questionnaires. This mirrors his description of current psychological functioning during interview. He also described ongoing sleep dysfunction, including untreated obstructive sleep apnea, and chronic pain symptoms involving his hips and knees. Especially when combined, these variables will certainly negatively impact cognitive functioning. Mr. Sturgell pattern of weakness seen across the current evaluation also aligns reasonably well with expectations. Medication side effects from Klonopin /clonazepam  use will also exacerbate ongoing deficits. The combination of these variables represents the most likely culprit for his current clinical presentation. While variable, I do not believe that current memory performances offer compelling evidence to suggest early stages of Alzheimer's disease, especially given the contribution of the aforementioned variables and evidence for improvement over time.    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   Elevated PSA 12/22/2012   Erectile dysfunction 10/23/2020   Gastroesophageal reflux disease without esophagitis 02/09/2022   Generalized anxiety disorder 01/01/2020   Hyperglycemia 09/24/2020   Hyperlipidemia 12/15/2018   Major depressive disorder 01/01/2020   Mild cognitive impairment of uncertain or unknown etiology 01/21/2021   Mild mitral regurgitation 08/15/2013   Nephrolithiasis 06/22/2016   Obstructive sleep  apnea 04/13/2015   no CPAP   Orthostatic lightheadedness 10/23/2020   Primary osteoarthritis of both hips 07/30/2019   Pure hypercholesterolemia 12/15/2018   RBBB (right bundle branch block) 12/03/2012   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   Thrombocytopenia 04/08/2020     Past Surgical History:  Procedure Laterality Date   APPENDECTOMY     FOOT TENDON SURGERY Right    TONSILLECTOMY       PREVIOUS MEDICATIONS:   CURRENT MEDICATIONS:  Outpatient Encounter Medications as of 03/11/2023  Medication Sig   aspirin EC 81 MG tablet Take 81 mg by mouth daily. Swallow whole.   clonazePAM  (KLONOPIN ) 1 MG tablet TAKE 1 TABLET BY MOUTH IN THE MORNING, 1/2 TABLET MIDDAY, 1/2 TABLET AT BEDTIME.   DULoxetine (CYMBALTA) 30 MG capsule Take 30 mg by mouth 2 (two) times daily.   hydrocortisone  2.5 % cream Apply 1 Application topically as needed (itching).   hydrOXYzine  (ATARAX ) 50 MG tablet Take 50 mg by mouth 3 (three) times daily as needed.   predniSONE  (DELTASONE ) 50 MG tablet Take one tablet po qd x 5 days with food.   Probiotic Product (DAILY PROBIOTIC PO) Take 1 capsule by mouth daily.   No facility-administered encounter medications on file as of 03/11/2023.     Objective:     PHYSICAL EXAMINATION:    VITALS:   Vitals:   03/11/23 1110  Weight: 183 lb (83 kg)    GEN:  The patient appears stated age and is in NAD. HEENT:  Normocephalic, atraumatic.   Neurological examination:  General: NAD, well-groomed, appears stated age. Orientation: The patient is alert. Oriented to person, place and date Cranial nerves: There is good facial symmetry.The speech is fluent and clear. No aphasia or dysarthria. Fund of knowledge is appropriate. Recent memory impaired and remote memory is normal.  Attention and concentration are normal.  Able to name objects and repeat phrases.  Hearing is intact to conversational tone .   Sensation: Sensation is intact to light touch throughout Motor: Strength is  at least antigravity x4.  No cogwheeling. DTR's 2/4 in UE/LE       No data to display             01/11/2022    9:00 AM 01/05/2022    1:29 PM 10/28/2021   12:03 PM  MMSE - Mini Mental State Exam  Orientation to time 5 4 5   Orientation to Place 5 5 4   Registration 3 3 3   Attention/ Calculation 5 5 5   Recall 2 3 1   Language- name 2 objects 2 2 2   Language- repeat 1 1 1   Language- follow 3 step command 3 2 2   Language- read & follow direction 1 1 1   Write a sentence 1 1 1   Copy design 0 1 1  Total score 28 28 26  Movement examination: Tone: There is normal tone in the UE/LE Abnormal movements:  no tremor.  No myoclonus.  No asterixis.   Coordination:  There is no decremation with RAM's. Normal finger to nose  Gait and Station: The patient has no difficulty arising out of a deep-seated chair without the use of the hands. The patient's stride length is good.  Gait is cautious and narrow.   Thank you for allowing us  the opportunity to participate in the care of this nice patient. Please do not hesitate to contact us  for any questions or concerns.   Total time spent on today's visit was 25 minutes dedicated to this patient today, preparing to see patient, examining the patient, ordering tests and/or medications and counseling the patient, documenting clinical information in the EHR or other health record, independently interpreting results and communicating results to the patient/family, discussing treatment and goals, answering patient's questions and coordinating care.  Cc:  Haze Kingfisher, MD  Camie Sevin 03/11/2023 11:15 AM

## 2023-03-17 ENCOUNTER — Ambulatory Visit: Payer: Self-pay | Admitting: Family Medicine

## 2023-03-17 ENCOUNTER — Ambulatory Visit: Payer: Medicare Other | Admitting: Internal Medicine

## 2023-03-17 ENCOUNTER — Encounter: Payer: Self-pay | Admitting: Internal Medicine

## 2023-03-17 VITALS — BP 112/74 | HR 83 | Temp 98.3°F | Ht 69.0 in | Wt 185.6 lb

## 2023-03-17 DIAGNOSIS — I83812 Varicose veins of left lower extremities with pain: Secondary | ICD-10-CM

## 2023-03-17 DIAGNOSIS — M79605 Pain in left leg: Secondary | ICD-10-CM

## 2023-03-17 DIAGNOSIS — B359 Dermatophytosis, unspecified: Secondary | ICD-10-CM

## 2023-03-17 MED ORDER — CLOTRIMAZOLE-BETAMETHASONE 1-0.05 % EX CREA
1.0000 | TOPICAL_CREAM | Freq: Two times a day (BID) | CUTANEOUS | 1 refills | Status: AC
Start: 2023-03-17 — End: ?

## 2023-03-17 NOTE — Telephone Encounter (Signed)
  Chief Complaint: Leg pain Symptoms: left ankle left leg, left knee pain Frequency: Constant Pertinent Negatives: Patient denies Leg swelling, redness, chest pain, SOB, injury. Disposition: [] ED /[] Urgent Care (no appt availability in office) / [x] Appointment(In office/virtual)/ []  [facility]/ [] Home Care/ [] Refused Recommended Disposition /[] [facility]/ []  Follow-up with PCP Additional Notes: Patient called with complaints of left leg pain that has been present for about 6 months. Patient states that pain is a dull achy pain that doesn't go away, starts at left knee and goes to the left ankle, and is worse at night when laying down. Current pain level is a 3 or 4 and patient is not using pain medications. Patient states that pain does not interfere with daily activities and cause is unknown. Patient denies injury/overuse. Patient advised by this RN to be seen within 2 weeks, to which patient was agreeable. Appt scheduled. Patient advised by this RN to call back with any worsening symptoms prior to appointment. Patient verbalized understanding.   Copied from CRM [id number]. Topic: Clinical - Red Word Triage >> Mar 17, 2023  9:02 AM Fonda Kinder J wrote: Red Word that prompted transfer to Nurse Triage: Severe Pain (right leg below the knee) Reason for Disposition  [1] MILD pain (e.g., does not interfere with normal activities) AND [2] present > 7 days  Answer Assessment - Initial Assessment Questions 1. ONSET: "When did the pain start?"      Six months ago 2. LOCATION: "Where is the pain located?"      Left knee to left ankle 3. PAIN: "How bad is the pain?"    (Scale 1-10; or mild, moderate, severe)   -  MILD (1-3): doesn't interfere with normal activities    -  MODERATE (4-7): interferes with normal activities (e.g., work or school) or awakens from sleep, limping    -  SEVERE (8-10): excruciating pain, unable to do any normal activities, unable to walk     "Three or  four" 4. WORK OR EXERCISE: "Has there been any recent work or exercise that involved this part of the body?"      Denies 5. CAUSE: "What do you think is causing the leg pain?"     "I'm not sure, I didn't hit it or anything. I just appeared one day and hasn't gone away" 6. OTHER SYMPTOMS: "Do you have any other symptoms?" (e.g., chest pain, back pain, breathing difficulty, swelling, rash, fever, numbness, weakness)     Denies  Protocols used: Leg Pain-A-AH

## 2023-03-17 NOTE — Patient Instructions (Signed)
Imaging will call to get you scheduled for your ultrasound

## 2023-03-17 NOTE — Progress Notes (Unsigned)
[facility] [address] [place] [address] Dept: [phone] Dept Fax: [phone]  Acute Care Office Visit  Subjective:   Richard Wilson Oct 31, 1950 03/17/2023  Chief Complaint  Patient presents with   Leg Pain    Left leg towards ankle started 4-5 months ago,  painful when moving     HPI: Discussed the use of AI scribe software for clinical note transcription with the patient, who gave verbal consent to proceed.  History of Present Illness   The patient presents with left lower leg pain and itchiness. The pain, described as 'tired' and 'achy,' has been ongoing for the past four to five months. It is located on the inner aspect of the leg and is most pronounced at night when lying down. The patient denies any injury or swelling to the leg. He also reports that the pain sometimes intensifies after walking and in the late afternoon. However, the pain seems to subside during sleep and is absent in the morning, only to return later in the day.   In addition to the leg pain, the patient has been experiencing itchiness in a specific spot on his leg for the past three to four weeks. The patient describes the itch as so intense that he 'wants to rip it right off the skin.' He has not applied any treatment to the area and is unsure if it has grown in size as he cannot see it.       The following portions of the patient's history were reviewed and updated as appropriate: past medical history, past surgical history, family history, social history, allergies, medications, and problem list.   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   Thrombocytopenia 04/08/2020   Generalized anxiety disorder 01/01/2020   Major depressive disorder 01/01/2020   Primary osteoarthritis of both hips 07/30/2019   Chronic kidney disease, stage 3a 03/19/2019   Hyperlipidemia 12/15/2018   Arthritis of knee 08/31/2017   Nephrolithiasis 06/22/2016   Obstructive sleep apnea 04/13/2015   Diverticulosis of large intestine without hemorrhage 09/30/2013   Mild mitral regurgitation 08/15/2013   Diastolic dysfunction 08/15/2013   Elevated PSA 12/22/2012   RBBB (right bundle branch block) 12/03/2012   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   Elevated PSA 12/22/2012   Erectile dysfunction 10/23/2020   Gastroesophageal reflux disease without esophagitis 02/09/2022   Generalized anxiety disorder 01/01/2020   Hyperglycemia 09/24/2020   Hyperlipidemia 12/15/2018   Major depressive disorder 01/01/2020   Mild cognitive impairment of uncertain or unknown etiology 01/21/2021   Mild mitral regurgitation 08/15/2013   Nephrolithiasis 06/22/2016   Obstructive sleep apnea 04/13/2015   no CPAP   Orthostatic lightheadedness 10/23/2020   Primary osteoarthritis of both hips 07/30/2019   Pure hypercholesterolemia 12/15/2018   RBBB (right bundle branch block) 12/03/2012   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 Swallow whole., Disp: , Rfl:    clonazePAM (KLONOPIN) 1 MG tablet, TAKE 1 TABLET BY MOUTH IN THE MORNING, 1/2 TABLET MIDDAY, 1/2 TABLET AT BEDTIME., Disp: 60 tablet, Rfl: 2   clotrimazole-betamethasone (LOTRISONE) cream, Apply 1 Application topically 2 (two) times daily., Disp: 60 g, Rfl: 1   Cyanocobalamin (VITAMIN B 12) 500 MCG TABS, Take by mouth., Disp: , Rfl:    DULoxetine (CYMBALTA) 30 MG capsule, Take 30 mg by mouth 2 (two) times daily., Disp: , Rfl:    hydrOXYzine (ATARAX) 50 MG tablet, Take 50 mg by mouth 3 (three) times daily as needed., Disp: , Rfl:    hydrocortisone 2.5 % cream, Apply 1 Application topically as needed (itching)., Disp: , Rfl:    predniSONE (DELTASONE) 50 MG tablet, Take one tablet po qd x 5 days with food., Disp: 5 tablet, Rfl: 0   Probiotic Product (DAILY PROBIOTIC PO), Take 1 capsule by mouth daily., Disp: , Rfl:  Allergies  Allergen Reactions   Gentamicin Swelling and Other (See Comments)    Eyes itched and were edematous     Penicillins Rash and Other (See Comments)    Pt reports is childhood allergy and does not remember the reaction       ROS: A complete ROS was performed with pertinent positives/negatives noted in the HPI. The remainder of the ROS are negative.    Objective:   Today's Vitals   03/17/23 1348  BP: 112/74  Pulse: 83  Temp: 98.3 F (36.8 C)  TempSrc: Temporal  SpO2: 98%  Weight: 185 lb 9.6 oz (84.2 kg)  Height: 5\' 9"  (1.753 m)    GENERAL: Well-appearing, in NAD. Well nourished.  SKIN: Pink, warm and dry. No rash, lesion, ulceration, or ecchymoses. Approx 1.5cm Erythematous circular rash with central clearing to medial aspect of left ankle RESPIRATORY: Chest wall symmetrical. Respirations even and  non-labored. CARDIAC: Peripheral pulses 2+ bilaterally.  MSK: Muscle tone and strength appropriate for age. Joints w/o tenderness, redness, or swelling. Varicose veins to LLE , spider veins surrounding ankles.  EXTREMITIES: Without clubbing, cyanosis, or edema.  NEUROLOGIC:  Steady, even gait.  PSYCH/MENTAL STATUS: Alert, oriented x 3. Cooperative, appropriate mood and affect.    No results found for any visits on 03/17/23.    Assessment & Plan:  Assessment and Plan    Left Lower Extremity Pain Chronic, aching pain in the left lower extremity, worse in the evenings and after walking. No associated swelling. Noted discoloration and varicosities. Suspect venous insufficiency. -Order lower extremity venous Doppler ultrasound to evaluate for venous insufficiency.  Ringworm Noted a scaly, itchy lesion on the left lower extremity, consistent with ringworm. -Prescribe Lotrisone cream, apply twice daily for one month.      Meds ordered this encounter  Medications   clotrimazole-betamethasone (LOTRISONE) cream    Sig: Apply 1 Application topically 2 (two) times  daily.    Dispense:  60 g    Refill:  1    Supervising Provider:   Garnette Gunner [[id number]]   No orders of the defined types were placed in this encounter.  Lab Orders  No laboratory test(s) ordered today   No images are attached to the encounter or orders placed in the encounter.  Return if symptoms worsen or fail to improve.   Salvatore Decent, FNP

## 2023-04-04 ENCOUNTER — Ambulatory Visit (INDEPENDENT_AMBULATORY_CARE_PROVIDER_SITE_OTHER): Admitting: Family Medicine

## 2023-04-04 ENCOUNTER — Encounter: Payer: Self-pay | Admitting: Family Medicine

## 2023-04-04 ENCOUNTER — Ambulatory Visit: Admitting: Family Medicine

## 2023-04-04 ENCOUNTER — Ambulatory Visit (INDEPENDENT_AMBULATORY_CARE_PROVIDER_SITE_OTHER): Payer: Medicare Other | Admitting: Podiatry

## 2023-04-04 ENCOUNTER — Ambulatory Visit: Payer: Self-pay | Admitting: Family Medicine

## 2023-04-04 ENCOUNTER — Encounter: Payer: Self-pay | Admitting: Podiatry

## 2023-04-04 VITALS — BP 106/80 | HR 72 | Temp 97.7°F | Resp 18 | Ht 69.0 in | Wt 181.0 lb

## 2023-04-04 DIAGNOSIS — M6740 Ganglion, unspecified site: Secondary | ICD-10-CM

## 2023-04-04 DIAGNOSIS — N5089 Other specified disorders of the male genital organs: Secondary | ICD-10-CM

## 2023-04-04 DIAGNOSIS — M79605 Pain in left leg: Secondary | ICD-10-CM

## 2023-04-04 DIAGNOSIS — M205X1 Other deformities of toe(s) (acquired), right foot: Secondary | ICD-10-CM | POA: Diagnosis not present

## 2023-04-04 NOTE — Progress Notes (Unsigned)
 Assessment & Plan:  1. Enlarged testicle (Primary) No abnormality upon physical assessment. Ultrasound due to patient report of enlarged testicle. Education provided on testicular self-exams.  - US SCROTUM W/DOPPLER; Future  2. Pain of left lower extremity No swelling upon physical assessment. Leg measurements within a couple of mm of each other. He will complete ultrasound as scheduled.    Follow up plan: Return if symptoms worsen or fail to improve.  Deliah Boston, MSN, APRN, FNP-C  Subjective:  HPI: Richard Wilson is a 73 y.o. male presenting on 04/04/2023 for left leg swelling (Swelling, pain and soreness in the last 6 weeks or so/) and Testicle Pain (Dull, not any sharpe pain = also feels larger than normal - noticed more recently )  Patient reports swelling and pain of the left scrotum for the past 2 to 3 weeks that has worsened over the past few days. His scrotum pain is constant and feels like a dull ache which he rates 4/10.  States there is just not enough room for his testicle due to swelling. He denies regular testicular self-exams.   States his left leg has also been swollen from his thigh down for the past six weeks. He has been seen for this previously and has an [place] scheduled for 04/12/23.   ROS: Negative unless specifically indicated above in HPI.   Relevant past medical history reviewed and updated as indicated.   Allergies and medications reviewed and updated.   Current Outpatient Medications:    aspirin EC 81 MG tablet, Take 81 mg by mouth daily. Swallow whole., Disp: , Rfl:    clonazePAM (KLONOPIN) 1 MG tablet, TAKE 1 TABLET BY MOUTH IN THE MORNING, 1/2 TABLET MIDDAY, 1/2 TABLET AT BEDTIME., Disp: 60 tablet, Rfl: 2   Cyanocobalamin (VITAMIN B 12) 500 MCG TABS, Take by mouth., Disp: , Rfl:    DULoxetine (CYMBALTA) 30 MG capsule, Take 30 mg by mouth 2 (two) times daily., Disp: , Rfl:    hydrocortisone 2.5 % cream, Apply 1 Application topically as needed (itching).,  Disp: , Rfl:    hydrOXYzine (ATARAX) 50 MG tablet, Take 50 mg by mouth 3 (three) times daily as needed., Disp: , Rfl:    Probiotic Product (DAILY PROBIOTIC PO), Take 1 capsule by mouth daily., Disp: , Rfl:    traZODone (DESYREL) 50 MG tablet, Take 50 mg by mouth at bedtime., Disp: , Rfl:    clotrimazole-betamethasone (LOTRISONE) cream, Apply 1 Application topically 2 (two) times daily. (Patient not taking: Reported on 04/04/2023), Disp: 60 g, Rfl: 1  Allergies  Allergen Reactions   Gentamicin Swelling and Other (See Comments)    Eyes itched and were edematous     Penicillins Rash and Other (See Comments)    Pt reports is childhood allergy and does not remember the reaction      Objective:   BP 106/80   Pulse 72   Temp 97.7 F (36.5 C)   Resp 18   Ht 5\' 9"  (1.753 m)   Wt 181 lb (82.1 kg)   BMI 26.73 kg/m    Physical Exam Vitals reviewed. Exam conducted with a chaperone present (J. Bullins).  Constitutional:      General: He is not in acute distress.    Appearance: Normal appearance. He is not ill-appearing, toxic-appearing or diaphoretic.  HENT:     Head: Normocephalic and atraumatic.  Eyes:     General: No scleral icterus.       Right eye: No discharge.  Left eye: No discharge.     Conjunctiva/sclera: Conjunctivae normal.  Cardiovascular:     Rate and Rhythm: Normal rate.  Pulmonary:     Effort: Pulmonary effort is normal. No respiratory distress.  Genitourinary:    Pubic Area: No rash or pubic lice.      Penis: Normal.      Testes: Normal.     Epididymis:     Right: Normal.     Left: Normal.  Musculoskeletal:        General: Normal range of motion.     Cervical back: Normal range of motion.     Right lower leg: No edema.     Left lower leg: No edema.     Comments: Measurements of bilaterally calves and thighs within a couple of mm of each other.   Skin:    General: Skin is warm and dry.  Neurological:     Mental Status: He is alert and oriented to  person, place, and time. Mental status is at baseline.  Psychiatric:        Mood and Affect: Mood normal.        Behavior: Behavior normal.        Thought Content: Thought content normal.        Judgment: Judgment normal.

## 2023-04-04 NOTE — Telephone Encounter (Signed)
 Copied from CRM [id number]. Topic: Clinical - Red Word Triage >> Apr 04, 2023  8:58 AM Irine Seal wrote: [facility] that prompted transfer to Nurse Triage: The patient reports experiencing pain in the groin area on one side, accompanied by some swelling in the testicles. The pain is described as a dull ache. The patient notes no difficulty walking or moving. Symptoms have been present for the past two weeks.   Chief Complaint: left scrotum pain Symptoms: swelling Frequency: ongoing 2-3 weeks worsened past few days Pertinent Negatives: Patient denies fever or redness Disposition: [] ED /[] Urgent Care (no appt availability in office) / [x] Appointment(In office/virtual)/ []  [facility] [place] [facility]/ [] Home Care/ [] Refused Recommended Disposition /[]  Mobile Bus/ []  Follow-up with PCP Additional Notes:  The patient reported that his left scrotum has been swollen for the past 2-3 weeks however, the past few days he noticed it more.  His left leg is also slightly swollen from his thigh down.  He reported a dull achy discomfort in his left scrotum that he rated 4/10.  He was scheduled for a same day appointment for further evaluation.  He had an appointment at 1 pm so he was scheduled for later appointment sos that he would not be late.   Reason for Disposition  Leg or ankle swelling also present  Answer Assessment - Initial Assessment Questions 1. SCROTAL SWELLING: "What does the scrotum look like?" "How swollen is it?" (mild, moderate severe; compare to other side)     Moderately swollen  2. LOCATION: "Where is the swelling located?"     Left scrotum is larger than right   3. ONSET: "When did the swelling start?"     2-3 weeks  4. PATTERN: "Does it come and go, or has it been constant since it started?"     Constant  5. SCROTAL PAIN: "Is there any pain?" If Yes, ask: "How bad is it?"  (Scale 1-10; or mild, moderate, severe)     Left testicle discomfort dull low grade pain 4/10  6. HERNIA: "Has  a doctor ever told you that you have a hernia?"     No  7. OTHER SYMPTOMS: "Do you have any other symptoms?" (e.g., abdomen pain, difficulty passing urine, fever, vomiting)     left leg slightly swollen from thigh down  Protocols used: Scrotum Swelling-A-AH

## 2023-04-04 NOTE — Telephone Encounter (Signed)
 Attempt #1: This RN attempted to call patient back after call dropped during warm transfer. Call to [facility]. LVM for call back. Copied from CRM [id number]. Topic: Clinical - Red Word Triage >> Apr 04, 2023  8:58 AM Irine Seal wrote: [facility] that prompted transfer to Nurse Triage: The patient reports experiencing pain in the groin area on one side, accompanied by some swelling in the testicles. The pain is described as a dull ache. The patient notes no difficulty walking or moving. Symptoms have been present for the past two weeks.

## 2023-04-04 NOTE — Progress Notes (Signed)
 Chief Complaint  Patient presents with   Toe Pain    Follow up cyst 3rd toe right  "Dr. Ardelle Anton drained it in December, but its back like it was before. I just want something done to it so it doesn't come back"    HPI: 73 y.o. male presenting today for follow-up evaluation of a mucoid cyst to the right third digit.  Patient states that it was drained here in our office on 01/18/2023 but it eventually recurred.  He is very frustrated with that and would like to have it removed.  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   Elevated PSA 12/22/2012   Erectile dysfunction 10/23/2020   Gastroesophageal reflux disease without esophagitis 02/09/2022   Generalized anxiety disorder 01/01/2020   Hyperglycemia 09/24/2020   Hyperlipidemia 12/15/2018   Major depressive disorder 01/01/2020   Mild cognitive impairment of uncertain or unknown etiology 01/21/2021   Mild mitral regurgitation 08/15/2013   Nephrolithiasis 06/22/2016   Obstructive sleep apnea 04/13/2015   no CPAP   Orthostatic lightheadedness 10/23/2020   Primary osteoarthritis of both hips 07/30/2019   Pure hypercholesterolemia 12/15/2018   RBBB (right bundle branch block) 12/03/2012   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  Dermatology: Skin is warm, dry and supple bilateral lower extremities.   Vascular: Palpable pedal pulses bilaterally. Capillary refill within normal limits.  No appreciable edema.  No erythema.  Neurological: Grossly intact via light touch  Musculoskeletal Exam: No pedal deformities noted.  There is some tenderness with palpation and range of motion of first MTP of the right great toe.  Mucoid cyst overlying the DIPJ of the right third toe  Radiographic Exam RT foot 01/18/2023:  Normal osseous mineralization. Joint spaces preserved.  No fractures or osseous irregularities noted.  Advanced degenerative changes noted to the first MTP of the right foot consistent with DJD/hallux limitus  Assessment/Plan of Care: 1.  Mucoid cyst right third toe; chronic and recurrent 2.  Hallux limitus/DJD right great toe joint  -Patient evaluated.  X-rays reviewed that were taken 01/18/2023 in detail with the patient -Unfortunately patient continues to have recurrent mucoid cyst.  It was drained today after aseptic prep using a tissue nipper and aspirated with a 18-gauge needle..  Patient tolerated this well -I do believe a more permanent solution however would be arthroplasty of the joint.  This was discussed in detail with the patient.  He would like to have this performed but currently he is very busy in his schedule -We also discussed possibility of right great toe arthroplasty with implant.  Patient continues to have chronic pain and tenderness to the right great toe joint but there has  been some improvement over the last few months he says it is not as excruciatingly painful is normal -Patient will return to the office when he is ready for surgical consult       Felecia Shelling, DPM [facility]  Dr. Felecia Shelling, DPM    [address] [place], [place] [address]                 Office [phone]  Fax [phone]

## 2023-04-05 ENCOUNTER — Encounter: Payer: Self-pay | Admitting: Family Medicine

## 2023-04-07 ENCOUNTER — Inpatient Hospital Stay: Payer: Medicare Other | Attending: Hematology and Oncology

## 2023-04-07 ENCOUNTER — Inpatient Hospital Stay: Payer: Medicare Other

## 2023-04-07 DIAGNOSIS — D45 Polycythemia vera: Secondary | ICD-10-CM

## 2023-04-07 DIAGNOSIS — D751 Secondary polycythemia: Secondary | ICD-10-CM | POA: Diagnosis present

## 2023-04-07 LAB — CBC WITH DIFFERENTIAL/PLATELET
Abs Immature Granulocytes: 0.01 10*3/uL (ref 0.00–0.07)
Basophils Absolute: 0.1 10*3/uL (ref 0.0–0.1)
Basophils Relative: 1 %
Eosinophils Absolute: 0.2 10*3/uL (ref 0.0–0.5)
Eosinophils Relative: 4 %
HCT: 50.6 % (ref 39.0–52.0)
Hemoglobin: 16.8 g/dL (ref 13.0–17.0)
Immature Granulocytes: 0 %
Lymphocytes Relative: 27 %
Lymphs Abs: 1.6 10*3/uL (ref 0.7–4.0)
MCH: 26.5 pg (ref 26.0–34.0)
MCHC: 33.2 g/dL (ref 30.0–36.0)
MCV: 79.7 fL — ABNORMAL LOW (ref 80.0–100.0)
Monocytes Absolute: 0.6 10*3/uL (ref 0.1–1.0)
Monocytes Relative: 10 %
Neutro Abs: 3.5 10*3/uL (ref 1.7–7.7)
Neutrophils Relative %: 58 %
Platelets: 193 10*3/uL (ref 150–400)
RBC: 6.35 MIL/uL — ABNORMAL HIGH (ref 4.22–5.81)
RDW: 13.3 % (ref 11.5–15.5)
WBC: 6.1 10*3/uL (ref 4.0–10.5)
nRBC: 0 % (ref 0.0–0.2)

## 2023-04-07 NOTE — Progress Notes (Signed)
 Richard Wilson presents today for phlebotomy per MD orders. Phlebotomy procedure started at 1403 and ended at 1407. 519 grams removed. Patient observed for 30 minutes after procedure without any incident. Patient tolerated procedure well.  IV needle removed intact. Vitals WNL

## 2023-04-07 NOTE — Patient Instructions (Signed)

## 2023-04-12 ENCOUNTER — Ambulatory Visit (HOSPITAL_COMMUNITY)
Admission: RE | Admit: 2023-04-12 | Discharge: 2023-04-12 | Disposition: A | Discharge status: Home / Self Care | Payer: Medicare Other | Source: Ambulatory Visit | Attending: Cardiovascular Disease | Admitting: Cardiovascular Disease

## 2023-04-12 DIAGNOSIS — M79605 Pain in left leg: Secondary | ICD-10-CM | POA: Insufficient documentation

## 2023-04-12 DIAGNOSIS — I83812 Varicose veins of left lower extremities with pain: Secondary | ICD-10-CM | POA: Insufficient documentation

## 2023-04-14 ENCOUNTER — Ambulatory Visit (HOSPITAL_BASED_OUTPATIENT_CLINIC_OR_DEPARTMENT_OTHER)
Admission: RE | Admit: 2023-04-14 | Discharge: 2023-04-14 | Disposition: A | Discharge status: Home / Self Care | Source: Ambulatory Visit | Attending: Family Medicine | Admitting: Family Medicine

## 2023-04-14 ENCOUNTER — Encounter: Payer: Medicare Other | Admitting: Family Medicine

## 2023-04-14 DIAGNOSIS — N5089 Other specified disorders of the male genital organs: Secondary | ICD-10-CM | POA: Diagnosis present

## 2023-04-15 ENCOUNTER — Encounter: Payer: Self-pay | Admitting: Internal Medicine

## 2023-05-02 ENCOUNTER — Encounter: Payer: Self-pay | Admitting: Family Medicine

## 2023-06-22 ENCOUNTER — Encounter: Payer: Self-pay | Admitting: Podiatry

## 2023-06-22 ENCOUNTER — Ambulatory Visit (INDEPENDENT_AMBULATORY_CARE_PROVIDER_SITE_OTHER): Admitting: Podiatry

## 2023-06-22 VITALS — Ht 69.0 in | Wt 181.0 lb

## 2023-06-22 DIAGNOSIS — M2041 Other hammer toe(s) (acquired), right foot: Secondary | ICD-10-CM | POA: Diagnosis not present

## 2023-06-22 DIAGNOSIS — M6740 Ganglion, unspecified site: Secondary | ICD-10-CM

## 2023-06-22 NOTE — Progress Notes (Unsigned)
 Chief Complaint  Patient presents with   Toe Pain    Pt is here to f/u on right 3rd toe due to mucoid cyst states it has came back and is bothersome, would like more information regarding surgical options.    HPI: 73 y.o. male presenting today for follow-up evaluation of a mucoid cyst to the right third digit.  The mucoid cyst continues to recur.  It has been present now for several months.  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   Elevated PSA 12/22/2012   Erectile dysfunction 10/23/2020   Gastroesophageal reflux disease without esophagitis 02/09/2022   Generalized anxiety disorder 01/01/2020   Hyperglycemia 09/24/2020   Hyperlipidemia 12/15/2018   Major depressive disorder 01/01/2020   Mild cognitive impairment of uncertain or unknown etiology 01/21/2021   Mild mitral regurgitation 08/15/2013   Nephrolithiasis 06/22/2016   Obstructive sleep apnea 04/13/2015   no CPAP   Orthostatic lightheadedness 10/23/2020   Primary osteoarthritis of both hips 07/30/2019   Pure hypercholesterolemia 12/15/2018   RBBB (right bundle branch block) 12/03/2012   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  Dermatology:  Skin is warm, dry and supple bilateral lower extremities.   Vascular: Palpable pedal pulses bilaterally. Capillary refill within normal limits.  No appreciable edema.  No erythema.  Neurological: Grossly intact via light touch  Musculoskeletal Exam: No pedal deformities noted.  There is some tenderness with palpation and range of motion of first MTP of the right great toe. recalcitrant mucoid cyst overlying the DIPJ of the right third toe  Radiographic Exam RT foot 01/18/2023:  Normal osseous mineralization. Joint spaces preserved.  No fractures or osseous irregularities noted.  Advanced degenerative changes noted to the first MTP of the right foot consistent with DJD/hallux limitus  Assessment/Plan of Care: 1.  Mucoid cyst right third toe; chronic and recurrent 2.  Hallux limitus/DJD right great toe joint  -Patient evaluated.  X-rays reviewed that were taken 01/18/2023 in detail with the patient - Today again we discussed DIPJ arthroplasty of the toe to correct for the recalcitrant mucoid cyst.  It has persistently been an issue for the patient and causing pain and tenderness especially in close toed shoes.  He states that he is ready for surgery.  Risk benefits advantages and disadvantages of the procedure were explained in detail to the patient.  No guarantees were expressed or implied.  All patient questions answered -Authorization for surgery was initiated today.  Surgery will consist of DIPJ arthroplasty third digit right foot -Return to clinic 1 week postop       Dot Gazella, DPM [facility]  Dr. Dot Gazella, DPM  [address] [place], [place] [address]                Office [phone]  Fax [phone]

## 2023-06-23 ENCOUNTER — Telehealth: Payer: Self-pay | Admitting: Podiatry

## 2023-06-23 NOTE — Telephone Encounter (Signed)
 Called pt a few times and it says the call cannot be completed at this time.

## 2023-07-08 ENCOUNTER — Telehealth: Payer: Self-pay | Admitting: Podiatry

## 2023-07-08 NOTE — Telephone Encounter (Signed)
 Called pt to get scheduled for surgery and it rings and then says my call cannot be completed at this time.

## 2023-07-12 ENCOUNTER — Inpatient Hospital Stay: Payer: Medicare Other

## 2023-07-12 ENCOUNTER — Inpatient Hospital Stay: Payer: Medicare Other | Attending: Hematology and Oncology | Admitting: Hematology and Oncology

## 2023-07-12 ENCOUNTER — Encounter: Payer: Self-pay | Admitting: Hematology and Oncology

## 2023-07-12 VITALS — BP 106/81 | HR 79 | Temp 97.4°F | Resp 18 | Ht 69.0 in | Wt 181.8 lb

## 2023-07-12 VITALS — BP 124/71 | HR 64 | Resp 18

## 2023-07-12 DIAGNOSIS — D751 Secondary polycythemia: Secondary | ICD-10-CM

## 2023-07-12 DIAGNOSIS — D45 Polycythemia vera: Secondary | ICD-10-CM

## 2023-07-12 LAB — CBC WITH DIFFERENTIAL/PLATELET
Abs Immature Granulocytes: 0.01 10*3/uL (ref 0.00–0.07)
Basophils Absolute: 0.1 10*3/uL (ref 0.0–0.1)
Basophils Relative: 1 %
Eosinophils Absolute: 0.3 10*3/uL (ref 0.0–0.5)
Eosinophils Relative: 5 %
HCT: 49.5 % (ref 39.0–52.0)
Hemoglobin: 16.7 g/dL (ref 13.0–17.0)
Immature Granulocytes: 0 %
Lymphocytes Relative: 29 %
Lymphs Abs: 1.7 10*3/uL (ref 0.7–4.0)
MCH: 26.2 pg (ref 26.0–34.0)
MCHC: 33.7 g/dL (ref 30.0–36.0)
MCV: 77.7 fL — ABNORMAL LOW (ref 80.0–100.0)
Monocytes Absolute: 0.7 10*3/uL (ref 0.1–1.0)
Monocytes Relative: 11 %
Neutro Abs: 3.2 10*3/uL (ref 1.7–7.7)
Neutrophils Relative %: 54 %
Platelets: 159 10*3/uL (ref 150–400)
RBC: 6.37 MIL/uL — ABNORMAL HIGH (ref 4.22–5.81)
RDW: 16.2 % — ABNORMAL HIGH (ref 11.5–15.5)
WBC: 6 10*3/uL (ref 4.0–10.5)
nRBC: 0 % (ref 0.0–0.2)

## 2023-07-12 NOTE — Progress Notes (Signed)
 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  We will only stop phlebotomy if his hemoglobin is 16 under  I recommend him to continue taking 81 mg aspirin daily  No orders of the defined types were placed in this encounter.    Almeda Jacobs, MD  INTERVAL HISTORY: he returns for surveillance follow-up and treatment for secondary polycythemia He tolerated phlebotomy well   PHYSICAL EXAMINATION: ECOG PERFORMANCE STATUS: 0 - Asymptomatic  Vitals:   07/12/23 0813  BP: 106/81  Pulse: 79  Resp: 18  Temp: (!) 97.4 F (36.3 C)  SpO2: 97%   Filed Weights   07/12/23 0813  Weight: 181 lb 12.8 oz (82.5 kg)    Relevant data reviewed during this visit included CBC  Summary of hematologic history Oncology History  Secondary erythrocytosis  04/25/2020 Bone Marrow Biopsy   BONE MARROW, ASPIRATE, CLOT, CORE:  -  Mildly hypercellular bone marrow with trilineage hematopoiesis with adequate maturation  -  See comment   PERIPHERAL BLOOD:  -  Erythrocytosis  -  Minimal thrombocytopenia  -  See CBC data   COMMENT:   The bone marrow shows trilineage hematopoiesis with a full spectrum of maturation, without significant dysplasia or increase in blasts.  There is no definitive morphologic evidence of involvement by a myeloid  neoplasm.  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  Correlation with clinical impressions, other laboratory data (eg. CBC trends), and pending cytogenetic results is recommended.  Cytogenetics are normal

## 2023-07-12 NOTE — Patient Instructions (Signed)

## 2023-07-12 NOTE — Progress Notes (Signed)
 Tj Kitchings presents today for phlebotomy per MD orders. Phlebotomy procedure started at 0919 with 16G phleb kit to R [facility] and clotted at 0923 with 85g removed. Restarted with 18G to L AC at 0934 ended at 0946 with 420g removed. 505 grams total removed. Patient observed for 30 minutes after procedure without any incident. Patient tolerated procedure well. IV needle removed intact.

## 2023-07-12 NOTE — Assessment & Plan Note (Addendum)
 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  We will only stop phlebotomy if his hemoglobin is 16 under  I recommend him to continue taking 81 mg aspirin daily

## 2023-09-09 ENCOUNTER — Telehealth: Payer: Self-pay

## 2023-09-09 ENCOUNTER — Ambulatory Visit (HOSPITAL_BASED_OUTPATIENT_CLINIC_OR_DEPARTMENT_OTHER)
Admission: RE | Admit: 2023-09-09 | Discharge: 2023-09-09 | Disposition: A | Discharge status: Home / Self Care | Source: Ambulatory Visit | Attending: Hematology and Oncology | Admitting: Hematology and Oncology

## 2023-09-09 ENCOUNTER — Encounter: Payer: Self-pay | Admitting: Hematology and Oncology

## 2023-09-09 ENCOUNTER — Inpatient Hospital Stay: Attending: Hematology and Oncology | Admitting: Hematology and Oncology

## 2023-09-09 VITALS — BP 138/83 | HR 77 | Temp 97.7°F | Resp 18 | Ht 69.0 in | Wt 177.6 lb

## 2023-09-09 DIAGNOSIS — I8393 Asymptomatic varicose veins of bilateral lower extremities: Secondary | ICD-10-CM | POA: Diagnosis not present

## 2023-09-09 DIAGNOSIS — D751 Secondary polycythemia: Secondary | ICD-10-CM | POA: Insufficient documentation

## 2023-09-09 DIAGNOSIS — M79605 Pain in left leg: Secondary | ICD-10-CM | POA: Diagnosis not present

## 2023-09-09 DIAGNOSIS — M79604 Pain in right leg: Secondary | ICD-10-CM | POA: Insufficient documentation

## 2023-09-09 NOTE — Progress Notes (Signed)
 VASCULAR LAB    Bilateral lower extremity venous duplex has been performed.  See CV proc for preliminary results.  Called report to Rocky LIS, Yacine Garriga, RVT 09/09/2023, 2:42 PM

## 2023-09-09 NOTE — Assessment & Plan Note (Addendum)
 He has been complaining of intermittent lower extremity achiness for some time On exam, it appears the patient has significant varicose veins DVT cannot be excluded and he is at high risk due to chronic secondary erythrocytosis Recommend urgent ultrasound venous Doppler to rule out DVT If the results are unrevealing, I will refer him to vascular medicine for further management Addendum at 2:30 PM: Preliminary findings from ultrasound venous Doppler did not show any evidence of DVT.

## 2023-09-09 NOTE — Addendum Note (Signed)
 Addended byBETHA BEDFORD, Lyrica Mcclarty on: 09/09/2023 02:40 PM   Modules accepted: Orders

## 2023-09-09 NOTE — Telephone Encounter (Signed)
 Patient notified of negative venous doppler results for DVT in BLE.  Dr. Lonn will refer patient to vascular medicine for further work-up.  Patient verbalized an understanding of the information.

## 2023-09-09 NOTE — Progress Notes (Addendum)
 [facility] OFFICE PROGRESS NOTE  Patient Care Team: Haze Kingfisher, MD as PCP - General (Family Medicine) Lonn Hicks, MD as Consulting Physician (Hematology and Oncology) Rhys Verneita ONEIDA DEVONNA as Physician Assistant (Psychiatry) Jerri Kay HERO, MD as Attending Physician (Orthopedic Surgery) Marijean Charleston, PhD (Psychiatry)  Assessment & Plan Bilateral lower extremity pain He has been complaining of intermittent lower extremity achiness for some time On exam, it appears the patient has significant varicose veins DVT cannot be excluded and he is at high risk due to chronic secondary erythrocytosis Recommend urgent ultrasound venous Doppler to rule out DVT If the results are unrevealing, I will refer him to vascular medicine for further management Addendum at 2:30 PM: Preliminary findings from ultrasound venous Doppler did not show any evidence of DVT.  Orders Placed This Encounter  Procedures   Ambulatory referral to Vascular Surgery    Referral Priority:   Routine    Referral Type:   Surgical    Referral Reason:   Specialty Services Required    Requested Specialty:   Vascular Surgery    Number of Visits Requested:   1     Hicks Lonn, MD  INTERVAL HISTORY: he returns for urgent evaluation The patient has been seen in follow-up for secondary erythrocytosis and has received intermittent phlebotomy He has been complaining of bilateral lower extremity pain starting from the knees all the way down to the anterior shin area that comes and goes It is not daily He also have cramping at the back of his calfs No ankle swelling Denies recent trauma He does not smoke  PHYSICAL EXAMINATION: ECOG PERFORMANCE STATUS: 1 - Symptomatic but completely ambulatory  Vitals:   09/09/23 1151  BP: 138/83  Pulse: 77  Resp: 18  Temp: 97.7 F (36.5 C)  SpO2: 97%   Filed Weights   09/09/23 1151  Weight: 177 lb 9.6 oz (80.6 kg)   Examination of his lower extremity did not  reveal any evidence of bilateral lower extremity edema.  He noted significant varicose veins bilaterally.  Preserved pulses

## 2023-10-12 ENCOUNTER — Inpatient Hospital Stay

## 2023-10-12 ENCOUNTER — Inpatient Hospital Stay: Attending: Hematology and Oncology

## 2023-10-12 VITALS — BP 143/70 | HR 76 | Temp 98.2°F | Resp 18

## 2023-10-12 DIAGNOSIS — D751 Secondary polycythemia: Secondary | ICD-10-CM | POA: Diagnosis present

## 2023-10-12 DIAGNOSIS — D45 Polycythemia vera: Secondary | ICD-10-CM

## 2023-10-12 LAB — CBC WITH DIFFERENTIAL/PLATELET
Abs Immature Granulocytes: 0.01 K/uL (ref 0.00–0.07)
Basophils Absolute: 0.1 K/uL (ref 0.0–0.1)
Basophils Relative: 1 %
Eosinophils Absolute: 0.3 K/uL (ref 0.0–0.5)
Eosinophils Relative: 5 %
HCT: 49.4 % (ref 39.0–52.0)
Hemoglobin: 16.5 g/dL (ref 13.0–17.0)
Immature Granulocytes: 0 %
Lymphocytes Relative: 29 %
Lymphs Abs: 1.6 K/uL (ref 0.7–4.0)
MCH: 26.7 pg (ref 26.0–34.0)
MCHC: 33.4 g/dL (ref 30.0–36.0)
MCV: 79.8 fL — ABNORMAL LOW (ref 80.0–100.0)
Monocytes Absolute: 0.7 K/uL (ref 0.1–1.0)
Monocytes Relative: 12 %
Neutro Abs: 3.1 K/uL (ref 1.7–7.7)
Neutrophils Relative %: 53 %
Platelets: 174 K/uL (ref 150–400)
RBC: 6.19 MIL/uL — ABNORMAL HIGH (ref 4.22–5.81)
RDW: 16 % — ABNORMAL HIGH (ref 11.5–15.5)
WBC: 5.6 K/uL (ref 4.0–10.5)
nRBC: 0 % (ref 0.0–0.2)

## 2023-10-12 NOTE — Progress Notes (Signed)
 Richard Wilson presents today for phlebotomy per MD orders. Phlebotomy procedure started at 815 and ended at 820. 510 grams removed. Patient observed for 30 minutes after procedure without any incident. Patient tolerated procedure well. IV needle removed intact.

## 2023-10-18 ENCOUNTER — Emergency Department (HOSPITAL_COMMUNITY)
Admission: EM | Admit: 2023-10-18 | Discharge: 2023-10-19 | Discharge status: Against Medical Advice | Attending: Emergency Medicine | Admitting: Emergency Medicine

## 2023-10-18 DIAGNOSIS — Z5321 Procedure and treatment not carried out due to patient leaving prior to being seen by health care provider: Secondary | ICD-10-CM | POA: Insufficient documentation

## 2023-10-18 DIAGNOSIS — F32A Depression, unspecified: Secondary | ICD-10-CM | POA: Diagnosis present

## 2023-10-18 NOTE — ED Triage Notes (Signed)
 Pt with c/o depression. Denies SI HI

## 2023-10-18 NOTE — ED Notes (Signed)
 Pt not answering to call for triage x 2

## 2023-10-18 NOTE — ED Notes (Signed)
 Pt not answering to call for triage

## 2023-10-18 NOTE — ED Notes (Signed)
 Patient stated that he no longer wanted to wait. He left. Taking OTF.

## 2023-11-07 ENCOUNTER — Other Ambulatory Visit: Payer: Self-pay

## 2023-11-07 DIAGNOSIS — M79604 Pain in right leg: Secondary | ICD-10-CM

## 2023-11-10 ENCOUNTER — Other Ambulatory Visit: Payer: Self-pay

## 2023-11-10 ENCOUNTER — Ambulatory Visit (HOSPITAL_COMMUNITY)
Admission: RE | Admit: 2023-11-10 | Discharge: 2023-11-10 | Disposition: A | Discharge status: Home / Self Care | Source: Ambulatory Visit | Attending: Family Medicine | Admitting: Family Medicine

## 2023-11-10 ENCOUNTER — Encounter (HOSPITAL_COMMUNITY): Payer: Self-pay

## 2023-11-10 VITALS — BP 132/79 | HR 87 | Temp 98.1°F | Resp 20

## 2023-11-10 DIAGNOSIS — M79604 Pain in right leg: Secondary | ICD-10-CM | POA: Diagnosis not present

## 2023-11-10 DIAGNOSIS — I70211 Atherosclerosis of native arteries of extremities with intermittent claudication, right leg: Secondary | ICD-10-CM | POA: Diagnosis not present

## 2023-11-10 NOTE — Discharge Instructions (Addendum)
 Use compression tights or socks to help with leg discomfort, you may also lift legs above level of the heart especially at night. Be sure to stay hydrated with water or diluted electrolyte beverages until you can be seen by PCP. May use Tylenol  or ibuprofen  as needed for pain.

## 2023-11-10 NOTE — ED Provider Notes (Signed)
 [facility]    CSN: [id number] Arrival date & time: 11/10/23  1122      History   Chief Complaint Chief Complaint  Patient presents with   Leg Pain    HPI Richard Wilson is a 73 y.o. male.   Patient presents today due to 5 right leg pain.  Patient states that his oncologist, that sees him for polycythemia, referred him to vascular specialist for symptoms.  Patient states that his legs ache all the time.  Patient states that he experiences pain with walking at a distance but experiences relief when he sits for some time.  Patient denies using any medications specifically for pain.  Patient states that his vascular appointment is November 2 and he is unsure of what to do at home until he can be seen.   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   Elevated PSA 12/22/2012   Erectile dysfunction 10/23/2020   Gastroesophageal reflux disease without esophagitis 02/09/2022   Generalized anxiety disorder 01/01/2020   Hyperglycemia 09/24/2020   Hyperlipidemia 12/15/2018   Major depressive disorder 01/01/2020   Mild cognitive impairment of uncertain or unknown etiology 01/21/2021   Mild mitral regurgitation 08/15/2013   Nephrolithiasis 06/22/2016   Obstructive sleep apnea 04/13/2015   no CPAP   Orthostatic lightheadedness 10/23/2020   Primary osteoarthritis of both hips 07/30/2019   Pure hypercholesterolemia 12/15/2018   RBBB (right bundle branch block) 12/03/2012   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   Thrombocytopenia 04/08/2020    Patient Active Problem List   Diagnosis Date Noted   Bilateral lower extremity pain 09/09/2023   Balance disorder 01/14/2023   Diverticulosis  01/14/2023   Gastroesophageal reflux disease without esophagitis 02/09/2022   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   Mild cognitive impairment of uncertain or unknown etiology 01/21/2021   Chronic idiopathic constipation 10/23/2020   Erectile dysfunction 10/23/2020   Hyperglycemia 09/24/2020   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   Thrombocytopenia 04/08/2020   Generalized anxiety disorder 01/01/2020   Major depressive disorder 01/01/2020   Primary osteoarthritis of both hips 07/30/2019   Chronic kidney disease, stage 3a 03/19/2019   Hyperlipidemia 12/15/2018   Pure hypercholesterolemia 12/15/2018   Arthritis of knee 08/31/2017   Nephrolithiasis 06/22/2016   Obstructive sleep apnea 04/13/2015   Vitamin D  deficiency 03/14/2015   Diverticulosis of large intestine without hemorrhage 09/30/2013   Mild mitral regurgitation 08/15/2013   Diastolic dysfunction 08/15/2013   Elevated PSA 12/22/2012   RBBB (right bundle branch block) 12/03/2012   Insomnia 10/26/2012    Past Surgical History:  Procedure Laterality Date   APPENDECTOMY     FOOT TENDON SURGERY Right    TONSILLECTOMY         Home Medications    Prior to Admission medications   Medication Sig Start Date End Date Taking? Authorizing Provider  clonazePAM  (KLONOPIN ) 1 MG tablet TAKE 1 TABLET BY MOUTH IN THE MORNING, 1/2 TABLET MIDDAY, 1/2 TABLET AT BEDTIME. 06/11/22  Yes Hurst, Verneita DASEN, PA-C  DULoxetine (CYMBALTA) 30 MG capsule Take 30 mg by mouth 2 (two) times daily. 09/22/22  Yes [provider]  hydrocortisone  2.5 % cream Apply 1 Application topically as needed (itching). 09/21/21  Yes [provider]  hydrOXYzine  (  ATARAX ) 50 MG tablet Take 50 mg by mouth 3 (three) times daily as needed.   Yes [provider]  Probiotic Product (DAILY PROBIOTIC PO) Take 1 capsule by mouth daily.   Yes [provider]  traZODone  (DESYREL ) 50 MG tablet Take 50 mg by mouth at bedtime.  04/03/23  Yes [provider]  aspirin EC 81 MG tablet Take 81 mg by mouth daily. Swallow whole.    [provider]  clotrimazole -betamethasone  (LOTRISONE ) cream Apply 1 Application topically 2 (two) times daily. Patient not taking: Reported on 06/22/2023 03/17/23   Billy Knee, FNP  Cyanocobalamin (VITAMIN B 12) 500 MCG TABS Take by mouth.    [provider]    Family History Family History  Problem Relation Age of Onset   Cancer Mother        Ovarian   Heart disease Mother    Memory loss Mother        with advanced age   Prostate cancer Father    Throat cancer Maternal Grandfather    Heart attack Paternal Grandfather    Cancer Maternal Aunt        Breast, metastatic    Social History Social History   Tobacco Use   Smoking status: Never   Smokeless tobacco: Never  Vaping Use   Vaping status: Never Used  Substance Use Topics   Alcohol use: Yes    Comment: infrequent glass of sherry   Drug use: Never     Allergies   Gentamicin and Penicillins   Review of Systems Review of Systems   Physical Exam Triage Vital Signs ED Triage Vitals  Encounter Vitals Group     BP 11/10/23 1213 132/79     Girls Systolic BP Percentile --      Girls Diastolic BP Percentile --      Boys Systolic BP Percentile --      Boys Diastolic BP Percentile --      Pulse Rate 11/10/23 1213 87     Resp 11/10/23 1213 20     Temp 11/10/23 1213 98.1 F (36.7 C)     Temp src --      SpO2 11/10/23 1213 94 %     Weight --      Height --      Head Circumference --      Peak Flow --      Pain Score 11/10/23 1211 7     Pain Loc --      Pain Education --      Exclude from Growth Chart --    No data found.  Updated Vital Signs BP 132/79   Pulse 87   Temp 98.1 F (36.7 C)   Resp 20   SpO2 94%   Visual Acuity Right Eye Distance:   Left Eye Distance:   Bilateral Distance:    Right Eye Near:   Left Eye Near:    Bilateral Near:     Physical Exam Vitals and  nursing note reviewed.  Constitutional:      General: He is not in acute distress.    Appearance: Normal appearance. He is not ill-appearing, toxic-appearing or diaphoretic.  Eyes:     General: No scleral icterus. Cardiovascular:     Rate and Rhythm: Normal rate and regular rhythm.     Heart sounds: Normal heart sounds.  Pulmonary:     Effort: Pulmonary effort is normal. No respiratory distress.     Breath sounds: Normal breath sounds. No wheezing  or rhonchi.  Musculoskeletal:     Comments: Fairly unremarkable right lower extremity, multiple varicosities noted, mild tenderness to palpation of anterior aspect of the leg, normal gait appreciated  Skin:    General: Skin is warm.  Neurological:     Mental Status: He is alert and oriented to person, place, and time.  Psychiatric:        Mood and Affect: Mood normal.        Behavior: Behavior normal.      UC Treatments / Results  Labs (all labs ordered are listed, but only abnormal results are displayed) Labs Reviewed - No data to display  EKG   Radiology No results found.  Procedures Procedures (including critical care time)  Medications Ordered in UC Medications - No data to display  Initial Impression / Assessment and Plan / UC Course  I have reviewed the triage vital signs and the nursing notes.  Pertinent labs & imaging results that were available during my care of the patient were reviewed by me and considered in my medical decision making (see chart for details).     Right leg pain-patient symptoms sound like claudication, suspect that he has arthrosclerosis of veins in his legs and that is why he is experiencing the symptoms.  Patient advised to use compression tights and elevate his legs above the level of his heart to help with pain.  Patient also advised to be sure to be staying hydrated as dehydration can also cause muscle aches.  Patient voiced understanding.  Final Clinical Impressions(s) / UC Diagnoses    Final diagnoses:  Atherosclerosis of native artery of right lower extremity with intermittent claudication     Discharge Instructions      Use compression tights or socks to help with leg discomfort, you may also lift legs above level of the heart especially at night. Be sure to stay hydrated with water or diluted electrolyte beverages until you can be seen by PCP. May use Tylenol  or ibuprofen  as needed for pain.     ED Prescriptions   None    PDMP not reviewed this encounter.   Andra Corean BROCKS, PA-C 11/10/23 1245

## 2023-11-10 NOTE — ED Triage Notes (Signed)
 PT reports Lt leg pain for 5 months. Pt went to his oncology MD who made an appt with vascular . HIS appt on NOV 2 . Pt did not call his oncologist to try to get an earlier appt with vascular. On August 8 he tested Neg. For DVT.  Pt reports he is getting ready to move does not know what to do.

## 2023-12-05 ENCOUNTER — Ambulatory Visit (HOSPITAL_COMMUNITY): Admission: RE | Admit: 2023-12-05 | Source: Ambulatory Visit

## 2023-12-05 ENCOUNTER — Ambulatory Visit: Attending: Hematology and Oncology

## 2024-01-10 NOTE — Discharge Summary (Addendum)
 [facility] - Discharge Summary  Date of Admission: 01/02/2024  6:43 PM Date of Discharge:  01/10/2024  2:05 PM Attending Provider: Christinia Brome, MD Hospital LOS:  8 days Time Spent performing discharge services: Approximately 35 minutes were spent on discharge: arranging follow-up, face to face encounter with patient for assessment, completing discharge paperwork, and psychoeducation. Discharge Diagnoses and Medications   Active Hospital Problems   Diagnosis Date Noted POA   *Bipolar II disorder, severe, depressed, with anxious distress (*) 11/08/2017 Yes   Sedative dependence with current use (*) 01/06/2024 Yes   Stage 3 chronic kidney disease (*) 03/19/2019 Yes   Generalized anxiety disorder 10/26/2012 Yes   Insomnia due to other mental disorder 10/26/2012 Yes    Resolved Hospital Problems  No resolved problems to display.     Medication List     START taking these medications      Instructions  QUEtiapine fumarate 100 mg tablet Dose: 100 mg For: Manic-Depression Commonly known as: SEROQUEL    Take one tablet (100 mg dose) by mouth at bedtime.   THERA Tabs Dose: 1 tablet For: Nutritional Support Start taking on: January 11, 2024    Take one tablet by mouth daily. Doctor's comments: OTC: ANY BRAND MULTIVITAMIN WITH MINERALS       CHANGE how you take these medications      Instructions  clonazePAM  0.5 mg tablet For: Feeling Anxious, Panic Disorder Commonly known as: KLONOPIN  What changed:  medication strength See the new instructions. Another medication with the same name was removed. Continue taking this medication, and follow the directions you see here.    Take two tablets (1 mg dose) by mouth every morning AND one tablet (0.5 mg dose) after lunch and after supper. Max Daily Amount: 2 mg.   DULoxetine HCl 40 MG Cpep Dose: 40 mg For: Major Depressive Disorder What changed:  medication strength when  to take this    Take one capsule (40 mg dose) by mouth after breakfast.   melatonin 5 mg Dose: 5 mg For: Trouble Sleeping What changed:  medication strength how much to take    Take one tablet (5 mg dose) by mouth at bedtime.       CONTINUE taking these medications      Instructions  benzonatate  200 MG capsule Dose: 200 mg For: Cough Commonly known as: TESSALON     Take one capsule (200 mg dose) by mouth 3 (three) times a day as needed for Cough for up to 7 days.   hydrocortisone  2.5 % cream Dose: 1 Application For: Skin Disease Successfully Treated with Steroid Therapy    Apply one Application topically 2 (two) times a day as needed (itching).   hydrOXYzine  HCl 50 mg tablet Dose: 50 mg For: Feeling Anxious, Insomnia Commonly known as: ATARAX     Take one tablet (50 mg dose) by mouth every 6 (six) hours as needed for Itching or Anxiety.   PROBIOTIC DAILY PO Dose: 1 capsule For: Nutritional Support    Take 1 capsule by mouth every morning.       STOP taking these medications    ARIPiprazole  5 mg tablet Commonly known as: ABILIFY      doxycycline  hyclate 100 mg tablet Commonly known as: VIBRA -TABS     traZODone  50 mg tablet Commonly known as: DESYREL          Risks, benefits, and side effects were discussed, in detail, prior to the discharge. Continue these medications until your  first appointment then as directed by provider.  Hospital Course   Bodee Lafoe Provencal is a 73 y.o. male with a history significant for anxiety and depression ; he had presented with indication(s) for admission, as stated below.  Indication for Admission: risk of self injury  On admission the H&P was completed/supervised by Psychiatrist. Significant information from the note is copied below: CC: I was very depressed     Treyvion Durkee is a 72 y.o. male that has a history of MDD and GAD who presented to the ED for suicidal ideation.  He reported feeling  depressed and suicidal for over a month and thought about purchasing a gun and shooting himself with it. He states he is depressed due to adjusting to living with his sister, feeling lonely,and financial strains.  He has attempted to commit suicide multiple times in the past and the last attempt was in 02/2023 via overdose.  He is under the care of a mental health professional an [facility] and has been compliant with his medications.    Per ED     Chief Complaint  Patient presents with   Depression      10/27 became more depressed than usual, which he is ion medication for. Has progressively gotten more difficulty to manage it. Sister and brother asked patient to come live with them which has patient realizing he does not mesh into their lives. Patient does not want family to know what he is being treated for. SI without specific plan, no HI. Patient safe where he lives. Hx of suicide attempt.     Per psychiatric consult noteMarion Ilyas Lipsitz is a 73 y.o. male with a history of MDD and GAD who presented to the ED for suicidal ideation. Patient report he has been feeling depress more then a month. Patient report a month ago her sister ask him to come lives with her and her husband because of his depression. Patient report it did not work out not because they are mistreating him but because his depression seen to bog him down. Patient report he has been thinking about suicide for about a month and thought about going to buy a gun to shoot him self. Patient report hx of suicide attempt and his last attempt was Jan 2025. Patient report he did not told any one about it. Patient report he is currently goes to [facility] for medication management but does not have therapy. During evaluation patient is calm and cooperative. Speech rate is normal and non pressure. Patient endorsed depress mood and not contracting for safety. Thought process is intact. Patient endorsed SI. Denies HI and AVH. Recommend  inpatient treatment for crisis stabilization due to risk of self harm. Patient is not currently feeling safe to be DC.      Per access report, Akul Leggette is a 73 y.o. male with a history of Depression who presented to the Patient location at time of Tele-Consult: [facility]. Means at which patient arrived to ED:: EMS. Patient is voluntarily and presents in ED due to SI. Pt reports I have been very depressed and angry and I don't feel there is a way out. Pt endorsed SI and thought about buying a gun and to shoot himself in the head.   Pt denied any plan or intent to do this.  Pt reports in Feb 2025 he took some pills but cannot recall the name of them and never told anyone or went for help.  Pt states  last attempt, aside from this one, was in 1996.  Pt reports long hx of outpatient MH treatment, but no therapy since earlier this year.  Pt reports he has been taking his medications as prescribed.  Pending therapy appt with [facility] in Jan 2026.  Last inpatient [facility] admission ws 2015 or 2016.Pt reports he lives with brother and sister in law and does not feel things have been going well overall.  Pt reports limited income and sometimes can spend too much money at the [employer] as he uses shopping as an escape.  Per chart, hx of depression and anxiety; however, unclear if any other periods of hypomania or mania in the past. Pt denied any drug.etoh use and states he takes klonopin  as prescribed.  UDS positive for benzos and BAL <10.  Pt denied current HI and/or plans and no AVH or evidence of current psychosis or mania at time of assessment.Current suicidal/homicidal ideations: Endorses SI     Psychiatric Interview on 01/03/2024: Brogan seen and evaluated in the office.  Patient reports she is in the hospital due to severe depression.  Patient reports he drove himself to the hospital on Sunday to seek help.  Patient reports he moved into his sister and brother-in-law's house in  October due to having financial issues and had to move out from his apartment.  Patient reports he has been staying with his sister for about a month and just does not feel comfortable staying with people in her house as he has been living alone for a long time.  Patient reports he can no longer afford his rent due to the recent increase in his rent.  Patient reports having difficulty adjusting with living with her sister.  Endorses feeling depressed and reports depression has worsened since July.  He reports that the sister and her husband has been very kind to him.  Patient reports history of previous overdose earlier this year by taking pills and denied he told anyone about it or seek for help.  He also reports intentions to buy again and shoot himself in the head.  Patient reports he decided to drive himself to the hospital to seek help.   Patient endorses depression and anxiety.  He currently denies suicidal or homicidal ideation.  He endorses feeling suicidal with a plan to purchase a gun and he was staying with his sister.  Pt reports he has been on klonopin  3mg  since 2004. His psychiatrist had cut down to 2mg  in the last 4 years where he takes 1mg  in the morning, 0.5mg  at lunch and supper. He denies auditory or visual hallucination.  He denies paranoid ideation.  He reports poor sleep and takes melatonin at night to sleep.  He reports his appetite is great.  Patient reports occasional alcohol use and reports he does not abuse alcohol.  He reports his last drink was on Saturday, 12/31/2023.  Alcohol level on admission was less than 10.   Current suicidal/homicidal ideations: On inquiry does not report SI/HI Current auditory/visual hallucinations: On inquiry does report hallucinations as mentioned above Current delusions/paranoia: As noted above Current withdrawal symptoms necessitating inpatient detox:  None reported/noted  Course of the hospitalization:  Leyland Kenna was Voluntary  Admission to behavioral health unit for safety, crisis stabilization, and further assessment and treatment. He  was integrated to all treatment modalities. Patient was maintained on standard precautions with 15 minutes checks. The patient was seen daily and case discussed in a multidisciplinary team meeting.The diagnoses were clarified and  appropriate psychotropic medications were prescribed; adjustment were made as described in the physician's progress notes. Physician(s) and team provided supportive therapy and education to the client on the risk and benefits of treatment options, medication side effects and on importance of adherence to chosen treatment. Unfortunately, due to testing positive for Influenza A he was advised isolation to his room. He was provided learning handoff on groups and in last 2 days he was able to attend all the groups for learning to manage emotions more productively and improving coping skills.   The patient was offered family contact/meeting: Patient did not allow contact with family/friends. On discharge day, he allowed contacting his sister to confirm discharge plan to her home.  Consulting Services: None  Information entered by the treatment team members, is copied below, to demonstrate the progress and other significant events (selected by the writer): Physician's note on 01/04/2024: On assessment:  Blayde was seen and evaluated in the office.  Patient reports feeling more irritable and agitated overnight and had to get Zyprexa 5 mg.  He reports feeling much better after Zyprexa was given and slept well last night.  Patient reports he was irritated and anxious about not getting his Klonopin  the way he used to get it at home.  He requests if it can be scheduled.  Provider discussed patient's medications and side effects and patient agreed to continuing on his current medications.  Patient denies alcohol withdrawal symptoms.  He reports he does not drink daily and occasionally  drinks dry sherry'.  He denies history of alcohol withdrawals in the past.  Patient denies suicidal or homicidal ideation.  He denies auditory or visual hallucination.  He denies paranoid ideation.  Patient agreed to discontinue Abilify  and start Risperdal tonight for mood stability.  Patient reports he gets irritated easily at home and plans to return back to his sister's house upon discharge.  He reports he would like his mood to improve prior to discharge.   Physician's note on 01/08/2024: On assessment:  Cheston was seen and evaluated in his room. He is advised isolation due to being positive of Influenza A. On further inquiry, shares information as noted in A/P. Education provided as noted in A/P. He shares his life struggles including his orientation and inability to voice his emotions at home growing-up. His parents expected he and his sister to always have good mannerism, were not allowed to speak loudly or disrespectfully even if they did not appreciate or like something. Writer discussed the impact of these teachings -- he learned to suppress his emotions and could not never feel he can express himself. Noted/reported side effects from medications: No.     Current suicidal/homicidal ideations: On inquiry does not report SI/HI Current auditory/visual hallucinations: On inquiry does not report hallucinations Denies hallucinations and does not appear to be responding to internal stimuli Current delusions/paranoia: None elicited  Physician's note on 01/09/2024: Problem Specific Assessment and Plan: - Bipolar II disorder, severe, depressed, with anxious distress (*) Education provided on Dx and Tx options, on potential risks vs benefits of medications; after discussion on Dx he is able to comprehend it and verbalizes the need for continued treatment with plan, as noted. Writer further discussed the need for lowering the dosage of antidepressant and continuation of the mood stabilizing  medicines. Continue Cymbalta 30mg  AM + 20 mg PM and from 12/9, will receive only 40 mg qAM (home dosage was 40 mg BID and he was taking second dose near bedtime) Started on Seroquel,  titrated to 100 mg hs, given advantage on anxiety and insomnia, in addition to stabilization of mood Continue melatonin 5 mg po for insomnia   - Sedative dependence with continued use for anxiety: Schedule clonazepam  per home dose 1mg  in am and 0.5mg  po at lunch and dinner. No changes made. He counseled to very gradually taper it off,  over 12 months. He clarifies he does not drink regularly   - Medical Diagnoses: Completed Doxycycline  100 mg po BID for Bronchitis. Does not have S/Sx including fever, cough, runny nose or SOB. He has completed suggested time for contact isolation, today he is able to come to the day area with surgical mask to attend and participate in group activities and eat with his peer. Continue Vitamins  Jonmichael improved with the above treatment as demonstrated by the improvement in sleep, mood, thought process and content, and participation in discharge planning. He did not report intolerant side-effects and no concerning adverse effects were noted or S/Sx of intoxication or withdrawal, for which continued inpatient care would be considered, on the discharge day.   Discharged on 01/10/2024 after discussing with the treatment team. Team members agree that Marcos Floria Sayre has received maximum benefit from inpatient hospitalization and is considered stable for discharge with scheduled mental health treatment appointment and safe place to live, as noted below. He verbalized an understanding the discharge instructions and appreciated the treatment received during the hospitalization. The patient agrees to call 911 and/or return to the ED if safety cannot be maintained outside the hospital setting.  Medication changes during this hospitalization: Dosage of Cymbalta was reduced. Abilify  was  changed to risperidone and then to quetiapine.   Justification for two or more antipsychotic medications: Is not being discharged on multiple antipsychotic meds  Routine labs completed at the facility prior to transfer at our facility were reviewed and significant labs were followed up. Additionally, metabolic panel was completed. Significant lab results: Lab Results  Component Value Date/Time   Hemoglobin A1c 5.9 (H) 01/03/2024 06:49 AM   CHOLESTEROL TOTAL 200 (H) 01/03/2024 06:49 AM   HDL 44 01/03/2024 06:49 AM   LDL 137 (H) 01/03/2024 06:49 AM   Trig 97 01/03/2024 06:49 AM   WBC 3.7 (L) 01/10/2024 12:41 PM   Absolute Neutrophil Count, Manual 2.37 01/10/2024 12:41 PM   HGB 16.2 01/10/2024 12:41 PM   MCV 80.3 01/10/2024 12:41 PM   MCHC 31.7 (L) 01/10/2024 12:41 PM   Plt Ct 172 01/10/2024 12:41 PM   Na 142 01/10/2024 12:41 PM   Potassium 4.5 01/10/2024 12:41 PM   Cl 105 01/10/2024 12:41 PM   CO2 26 01/10/2024 12:41 PM   Creatinine 1.13 01/10/2024 12:41 PM   eGFR 69 01/10/2024 12:41 PM   ALT 21 01/10/2024 12:41 PM   AST 27 01/10/2024 12:41 PM   ALK PHOS 117 01/10/2024 12:41 PM   TSH 2.13 01/01/2024 12:15 PM   Vitamin B-12 349 03/31/2017 02:41 PM   Vit D, 25-Hydroxy 40.9 10/29/2016 11:02 AM   RPR QUALITATIVE Non-Reactive 01/03/2024 06:49 AM   Hospital Encounter on 01/01/24  ECG 12 lead  Result Value Ref Range   Acquisition Device MV360    Ventricular Rate 89 BPM   Atrial Rate 89 BPM   P-R Interval 146 ms   QRS Duration 132 ms   Q-T Interval 386 ms   QTC Calculation(Bazett) 469 ms   Calculated P Axis 73 degrees   Calculated R Axis 71 degrees   Calculated T Axis 41 degrees  ECG Diagnosis      Normal sinus rhythm Right bundle branch block Abnormal ECG No previous ECGs available Carleen Mayo ([id number]) on 01/01/2024 1:16:20 PM certifies that he/she has reviewed the ECG tracing and confirms the independent interpretation is correct.    Reviewed the recent Labs and  ECG.  Tobacco Use Screening and Recommendation    Spent 0 minutes on smoking and tobacco use cessation counseling was provided during todays visit.  Tobacco use in the past 30 days? No The patient was not counseled on the dangers of tobacco use and practical counseling included: N/A.   Reviewed strategies to maximize success, FDA-approved cessation medication prescription offered/prescribed: N/A Referral to outpatient treatment for tobacco use disorder was not indicated.    Substance Use Screening and Recommendation   Substance abuse in past 12 months? Yes The patient was counseled on the dangers of alcohol/substance use and practical counseling included: Concern for drinking, smoking and drug abuse can lead to dependence; alcohol and recreational drugs are linked to acute and chronic mental health issues, and health risk such as HTN, heart attack, renal failure, hepatic failure, and stroke, and advised to seek treatment, and abstain from use. Referral to outpatient treatment for Substance use disorder was given. When applicable, scheduled referrals are listed below.  Condition for Discharge   Vitals:   01/09/24 0709 01/09/24 2023  BP: 135/90 (!) 146/91  Pulse: 90 98  Resp: 16 17  Temp: 97.4 F (36.3 C) 98.2 F (36.8 C)  SpO2: 97% 96%    SUICIDE ASSESSMENT for DISCHARGE: Non-modifiable risk factors for suicide: Biological male sex at birth, Older Age, Prior history of suicide attempt(s) or serious self-harm behavior, History of a past or current impulse control disorder or aggression, History of a past or current mental health disorder, particularly a depressive disorder, severe anxiety or eating disorder, Single, widowed, divorced or separated Suicide risk protective factors: Presence of support, Safety plan in place for discharge, Reason for living, Adequate coping skills, Religious or spiritual beliefs against suicide Interventions to improve safety on discharge: Follow-up care  scheduled within a week of discharge for continued care, Assessed that the patients current mental health condition is stabilized well enough to transition to community based care, Assessed for appropriate resolution of the patients suicidal intent and/or plan, Provided the patient information on emergency resources in the community, It was confirmed with family or close contact that the patient does not have immediate access to a gun at discharge, Ensured access to medications on discharge  MENTAL STATUS EXAM for DISCHARGE: Constitutional:   General Appearance Casually dressed  General Behavior Appropriate eye contact and behavior during the session  Musculoskeletal:   Gait and Station No gait abnormalities observed  Strength and tone in extremities: muscle strength is equal and normal, Normal tone is noted  Psychiatric:   Psychomotor Activity No motor abnormalities noted or reported  Speech  Normal in rate/volume/tone  Mood  As reported Much Better  Affect  Appropriate: Euthymic and mood congruent  Thought Process Linear and goal directed  Associations Intact  Thought Content/Perceptual Disturbances No Evidence of: Delusions, Hallucinations, Suicidal ideation, and Homicidal ideation  Cognition/Sensorium  AAOx4; Memory, attention, language, and fund of knowledge intact   Insight  insight improving with regard to acceptance of having a psychiatric disorder, its seriousness, implications for treatment and the importance of taking measures to avoid relapse.  Judgment Judgement is improving in terms of awareness, planning, and acceptance of limitations   Discharge Instructions and Disposition  Unresulted [employer] Current Status   Vitamin B12 In process      Discharge Procedure Orders  Ambulatory referral to Psychiatry  Standing Status: Future  Referral Type: Consultation  Referral Reason: Specialty Services Required  Requested Specialty: Psychiatry  Number of Visits  Requested: 1 Expiration Date: 07/07/24   Follow-up with Primary Care Physician  Standing Status: Future  Referral Priority: Routine Referral Type: Consultation  Referral Reason: Specialty Services Required  Number of Visits Requested: 1 Expiration Date: 07/07/24   Heart Healthy diet   High Fiber Diet   Notify physician for suicidal / homicidal thoughts   Notify physician for medication concerns   Activity as tolerated   Drink lots of fluids   Take medications as prescribed   Attend scheduled appoinments  Order Comments: See Below   Per case management / treatment team   Do not use drugs or alcohol   Weapons to be secured at home (Safety)   Lab/Blood draw instructions  Standing Status: Future Standing Exp. Date: 04/09/24  Order Comments: CBC with differential, CMP, HbA1C, Lipid panel, TSH  Dx: Long-term and Current use of psychotropics drugs   Other instructions  Order Comments: Bibliotherapy:  Feeling Good: The New Mood Therapy  Alm BIRCH. Geofm, M.D.  The Bipolar Disorder Survival Guide, Third Edition: What You and Your Family Need to Know  Alm DOROTHA Ness, Ph.D.  [employer]'s Not Enough: Recovering from Addictions of Every Kind Eveline HERO. [place], Ph.D.  [place] 12-Step meetings finder: AA: perfumereseller.ca  Search for Therapist:  https://www.psychologytoday.com/us [place]    Contact information for follow-up     [facility]   [address] [place], [place] [address] Phone: [phone]     Next Steps: Follow up on 01/17/2024   Instructions: Appointment: 9:30 AM, Psychiatry and Counseling   Selinda FORBES Low, MD  Specialty: Psychiatry   [address] [place] [address]  Phone: [phone]     Next Steps: Follow up   Instructions: Mood d/o, Anxiety D/o   Lamar JULIANNA Siva, MD  Specialty: Family Medicine, Gastroenterology  Relationship: PCP - General   [address] [place] [address]  Phone: [phone]     Next Steps: Follow up   Follow-up with Primary Care Physician      Next Steps: Follow up   Instructions: Annual Wellness Check-up for metabolic issues,      The patient was referred to the providers listed above at the appointment time listed above for the treatment of behavioral health.  Disposition: Home   Recommendations to physicians: The patient may benefit from attending individual and group psychotherapy. Follow-up on CBC, Hepatic and Renal functions, TSH, and Metabolic panel, including HbA1c, lipids Q 3-6 months; monitoring of weight and BP at every visit.   Electronically signed by: Christinia Brome, MD 01/10/2024

## 2024-01-11 ENCOUNTER — Inpatient Hospital Stay

## 2024-01-27 NOTE — ED Provider Notes (Signed)
 I saw the patient as the provider in triage and performed a brief history and physical exam.  I ordered appropriate tests.  Patient will be seen by APP and/or the physician who will evaluate the patient further.  Brief history:  73 year old male presents secondary to constipation has not had a bowel movement in the last 3 days.  Was seen here on the 15th for the same problem had a CT scan which did show moderate stool stool burden within the colon and rectum.  He states he had a mild manual disimpaction and then was advised to use Colace daily which he did not he states he has not had any fevers vomiting or abdominal pain.  Patient advised will have provider in the back examine him as he just may need an enema at this time.  Without fever or abdominal pain do not feel CT repeat is indicated presently  Vitals:   01/27/24 1119  BP: (!) 136/97  Pulse: 76  Resp: 18  Temp: 97.3 F (36.3 C)  SpO2: 96%     Physical exam: General: Alert, no acute distress Respiratory: Nonlabored respirations Psych: Cooperative, appropriate mood and affect    Please excuse typos and grammatical errors is [employer] was used for dictation.  Please contact me with any problems

## 2024-01-27 NOTE — ED Provider Notes (Signed)
 ------------------------------------------------------------------------------- Attestation signed by Rosine Bares, MD at 01/27/2024  9:18 PM I did not evaluate or directly care for the patient but was available for any questions or concerns throughout the ER stay.  Rosine Bares, MD  -------------------------------------------------------------------------------  [facility] Emergency Department Note  Chief Complaint:  Constipation (No bowel movement x 3 days. )    History of Present Illness   Richard Wilson is a 73 y.o. male with history of MDD, anxiety disorder, appendectomy who presents to the Emergency Department for constipation x 3 days.  Reports that he was just here on the 15th and had to get disimpacted.  Reports that this feels similar.  He has had no nausea or vomiting.  Denies any blood in the stool.  Denies any difficulty urinating.  He did have labs drawn and a CT scan done at this past visit.  CT scan demonstrated moderate rectal stool burden no rectal wall thickening or perirectal stranding to suggest stercoral colitis.  He denies any fever.  Independent Historian: Patient  Review of Systems   As per HPI  Physical Exam   BP (!) 136/97 (BP Location: Right arm, Patient Position: Sitting)   Pulse 76   Temp 97.3 F (36.3 C) (Oral)   Resp 18   Ht 175.3 cm (5' 9)   Wt 83.9 kg (185 lb)   SpO2 96%   BMI 27.32 kg/m    GENERAL: Patient appears in no acute distress. Well developed. HEENT: Head normocephalic, atraumatic. External ears normal.  No nasal drainage.  CARDIOVASCULAR: Regular rate and regular rhythm without murmur.  PULMONARY: Breath sounds CTA bilaterally. No labored breathing. Normal effort. GI: Nontender, nondistended. No tenderness over McBurney point, no murphy's sign, negative rovsings sign. No rebound or guarding.  RECTAL: No palpable mass on DRE.  Did have fecal impaction.  Manual disimpaction  performed. MUSCULOSKELETAL: No obvious deformity or edema noted NEUROLOGICAL: No focal deficits INTEGUMENTARY: No rashes.  PSYCH: Appropriate mood and affect.  Procedures   Procedures              ED Course & Medical Decision Making   73 year old male presents with constipation.  Was recently seen on the 15th had CT imaging and labs drawn.  States this feels similar had to be disimpacted then.  He has no nausea, vomiting or fever.  At this point I do not think we need to repeat labs or CT imaging.  Will plan to order a KUB.  KUB demonstrates stool burden.  No findings of bowel obstruction.  Patient is had no nausea or vomiting.  He had blood work change roughly 9 days ago for similar.  Also had CT scan that I reviewed as well.  Overall seems consistent with fecal impaction.  I did manually disimpact.  He was then given an enema.  Had a large bowel movement afterwards.  Is feeling much better.  At this point I do think patient can be safely discharged home.  Will provide MiraLAX he has Colace at home.  Advised him to follow-up with his PCP.  Return back to the emergency department if symptoms worsen or new symptoms arise.  Reassessment:  ED Course as of 01/27/24 1649  Fri Jan 27, 2024  1644 XR Abdomen 1 View Significant stool burden, no bowel loop distention or other acute findings.  [CK]  1644 Then had fecal impaction.  Did manual disimpact.  Was given an enema.  Patient had a large bowel movement. [CK]    ED Course  User Index [CK] Carlin Gravel [place], PA-C    Independent Interpretation of Studies: X-rays; significant stool burden.  External Records Reviewed: Outpatient notes; office visit with family med on 01/19/2024 Other; CT scan on 12/15 demonstrates moderate rectal stool burden.  Discussion with other Services: None  Escalation of Care, Including Admision/Obs considered but not performed: Appropriate for outpatient management; follow-up PCP.  Return if symptoms  worsen or new symptoms arise.          Patient was felt to be appropriate for discharge  Patient was given instructions to follow-up with their primary care provider or other specialist indicated in the next 2-4 days. Patient should otherwise return to the emergency department for any changing or worsening symptoms.  Discussed with patient/family regarding diagnoses, treatment plan, and diagnostic workup and need for follow-up. They understand and are in agreement.  In addition, other diagnostic possibilities were also discussed. It was explained that the clinical impression which was reached, based on the patient's history, physical, and diagnostic workup, is what I feel is the most likely possibility at this time. As with any illness or injury other clinical impressions are possible and may arise as the illness or injury progress. Due to this, instructions were given to return at any time if the illness or injury gets worse.  Upon discharge, if applicable, I have reviewed the patient's 16-month prescribing history under the [place]  Controlled Substance reporting system  Patient was discharged in stable condition    Comment: Please note this report has been produced using speech recognition software and may contain errors related to the system including errors  in grammar, punctuation, and spelling, as well as words and phrases that may be inappropriate. This report was also generated from background information included in the EMR. This information was only reviewed as it was pertinent to the current presenting medical condition and not reviewed in its entirety. This may result in inaccurate information being included in the medical record. If  here are any questions or concerns please feel free to contact the dictating provider for clarification.   Clinical Impression & Disposition   1. Fecal impaction    (CMD)         Disposition:  Discharge   Home  ED Prescriptions      Medication Sig Dispense Start Date End Date Auth. Provider   polyethylene glycol (MIRALAX) 17 gram powd powder Take 17 g by mouth daily as needed for constipation. 510 g 01/27/2024 02/26/2024 Carlin Gravel [place], PA-C          Past Contributory History  Reviewed from chart  Allergies: Gentamicin and Penicillins   Personal History: Medical History[1]  Surgical History[2] Family History[3]  Social History[4]  Home Medications: Current Outpatient Medications  Medication Instructions   aspirin 81 mg, oral   clonazePAM  (KLONOPIN ) 1 mg, oral, 2 times daily   hydrOXYzine (ATARAX) 50 mg, oral   Lactobacillus acidophilus (PROBIOTIC ORAL) 1 capsule, oral, Daily   polyethylene glycol (MIRALAX) 17 g, oral, Daily PRN   traZODone  (DESYREL ) 50 mg, oral, At bedtime     Results  Labs: Labs Reviewed - No data to display  Imaging: XR Abdomen 1 View  Final Result by Delmar Herbert Faith Results In [id number] (12/26 1543)  DATE OF SERVICE: 01/27/24 03:42:45 P.M.  EXAMINATION: XR ABDOMEN 1 VIEW    HISTORY: Constipation    COMPARISON: No relevant prior comparisons available    FINDINGS/  IMPRESSION:  Significant stool burden, no bowel loop distention or other acute  findings.      Thank you for the referral of this patient. This examination has been   interpreted by a board certified radiologist with [facility].   If there are any questions, please feel free to reach us  at [phone].    Dictated By:  JINNY. Alm Saucer, M.D.   01/27/2024 3:43 PM       EKG: No results found for this or any previous visit (from the past 4464 hours).   ED Meds Given: Medications - No data to display   Clinical Decision Support:     CHA2DS2-VASc Score: N/A           Glasgow Coma Scale Score: 15                                           Carlin ORN [facility]  Department of Emergency Medicine   Time Seen     Event Date/Time   First  Provider Evaluation 01/27/24 1116              [1] Past Medical History: Diagnosis Date   Anxiety    Depression   [2] Past Surgical History: Procedure Laterality Date   APPENDECTOMY     APPENDECTOMY     Procedure: APPENDECTOMY   TONSILLECTOMY     TONSILLECTOMY     Procedure: TONSILLECTOMY  [3] Family History Problem Relation Name Age of Onset   Colon cancer Father    [4] Social History Tobacco Use   Smoking status: Never   Smokeless tobacco: Never  Vaping Use   Vaping status: Never Used  Substance Use Topics   Alcohol use: Not Currently    Alcohol/week: 1.0 standard drink of alcohol   Drug use: Never    Comment: Drug use: Denies

## 2024-03-09 NOTE — Progress Notes (Incomplete)
 "   Mild Cognitive Impairment    Richard Wilson is a very pleasant 74 y.o. RH male with a history of  secondary polycythemia, and a diagnosis of memory loss of unclear etiology but likely combination of medication side effects, B12 deficiency moderate to severe psychiatric distress and acute testing anxiety but repeat neuropsych evaluation October 2024 without indication of neurodegenerative disease,  presenting today in follow-up for evaluation of memory loss. Patient is not on anti-inflammatory medication memory remains stable till now***. Patient was last seen on 03/11/2023***.  MMSE today is  /30. Patient is able to participate on ADLs and to to drive without difficulties. Mood is ***. This patient is accompanied in the office by ***  who supplements the history. Previous records as well as any outside records available were reviewed prior to todays visit   Follow up in  *** months Repeat neuropsych evaluation for diagnostic clarity*** Continue to control mood as per psychiatry recommend good control of cardiovascular risk factors      Discussed the use of AI scribe software for clinical note transcription with the patient, who gave verbal consent to proceed.  History of Present Illness    Any changes in memory since last visit? About the same Sometimes he confuses one word with another.  He reads frequently, does not do any crossword puzzles or word finding or any other brain games. repeats oneself?  Endorsed Disoriented when walking into a room?  Patient denies    Misplacing objects?  Sometimes  Wandering behavior?   denies   Any personality changes since last visit?  He has a history of high anxiety, sees a psychotherapist in [place].   He in on Klonopin    Any worsening depression?:  He had a recent event on 01/10/2024 in which he was at risk of harming himself, he has suicidal ideation without specific plan no homicidal ideation.  He was admitted voluntarily. Hallucinations or  paranoia?  denies   Seizures?   denies    Any sleep changes? Sleeps well .He  dreams frequently , REM behavior or sleepwalking   Sleep apnea?   denies   Any hygiene concerns?   denies   Independent of bathing and dressing?  Endorsed  Does the patient needs help with medications? Patient is in charge   Who is in charge of the finances?  Patient is in charge     Any changes in appetite?  Denies.     Patient have trouble swallowing?  denies   Does the patient cook?  No  Any headaches?    denies   Vision changes? denies Chronic pain?  denies   Ambulates with difficulty?    Denies. Planning to mobilize more     Recent falls or head injuries?    denies      Unilateral weakness, numbness or tingling?   denies   Any tremors?  denies   Any anosmia?    denies   Any incontinence of urine?  denies   Any bowel dysfunction?   severe constipation having presented to the ER with same.  Patient lives alone    Does the patient drive?  Yes he denies getting lost.      Personally reviewed MRI of the brain in January 2023 was remarkable for minimal parenchymal generalized volume loss, without acute intracranial abnormalities     Last visit June 2023 Richard Wilson is a very pleasant 74 y.o. RH male  seen today in follow up for memory loss. This  patient is here alone  Previous records as well as any outside records available were reviewed prior to todays visit.   Any changes in memory since last visit? About the same.  He reports confusing one word with another, Patient lives with: Patient lives alone repeats oneself?  Denies  Disoriented when walking into a room?  Patient denies   Leaving objects in unusual places?  Patient denies   Ambulates  with difficulty?   Patient denies   Recent falls?  Patient denies   Any head injuries?  Patient denies   History of seizures?   Patient denies   Wandering behavior?  Patient denies   Patient drives? Endorsed Any mood changes?  Patient denies.  He is under the  care of behavioral health.  He is on multiple psychiatric medications, including clonazepam  1 mg 3 times daily (increased on 04/22/2021).  He reports to be more withdrawn  Any worsening depression?:  Patient denies   Hallucinations?  Patient denies   Paranoia?  Patient denies   Patient reports that he sleeps well without vivid dreams, REM behavior or sleepwalking.  He takes 2 naps a day. Any hygiene concerns?  Patient denies   Independent of bathing and dressing?  Endorsed  Does the patient needs help with medications?  Patient in charge  who is in charge of the finances?  Patient is in charge  Any changes in appetite?  Patient denies   Patient have trouble swallowing? Patient denies   Does the patient cook?  Patient denies   Any kitchen accidents such as leaving the stove on? Patient denies   Any headaches?  Patient denies   The double vision? Patient denies   Any focal numbness or tingling?  Patient denies   Chronic back pain Patient denies   Unilateral weakness?  Patient denies   Any tremors?  Patient reports tremors, right greater than left.  However, during this visit, no tremors are noted.  He is on clonazepam , wonder if when he is off of clonazepam  the tremors appear.  He is off of Abilify  by now. Any history of anosmia?  Patient denies   Any incontinence of urine?  Patient denies   Any bowel dysfunction?   Patient denies        Neuropsychological evaluation 11/23/2022  Briefly, results suggested impairment surrounding basic sentence repetition, phonemic fluency, and encoding (i.e., learning) aspects of verbal memory tasks. Further performance variability was exhibited across executive functioning and both delayed retrieval and recognition/consolidation aspects of memory. Relative to his previous evaluation in December 2022, subtle to mild improvements were exhibited across numerous cognitive domains. This was most notable across cognitive flexibility, receptive language, and semantic  fluency. However, mild improvements were also exhibited across processing speed and visuospatial abilities. Remaining cognitive domains exhibited relative stability. No cognitive domain exhibited appreciable cognitive decline. The etiology for ongoing dysfunction continues to be multifactorial in nature. Mr. Vanzanten reported acute symptoms of severe depression and severe anxiety during the past 1-2 weeks across mood-related questionnaires. This mirrors his description of current psychological functioning during interview. He also described ongoing sleep dysfunction, including untreated obstructive sleep apnea, and chronic pain symptoms involving his hips and knees. Especially when combined, these variables will certainly negatively impact cognitive functioning. Mr. Haack pattern of weakness seen across the current evaluation also aligns reasonably well with expectations. Medication side effects from Klonopin /clonazepam  use will also exacerbate ongoing deficits. The combination of these variables represents the most likely culprit for his current clinical presentation. While  variable, I do not believe that current memory performances offer compelling evidence to suggest early stages of Alzheimer's disease, especially given the contribution of the aforementioned variables and evidence for improvement over time.          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   Elevated PSA 12/22/2012   Erectile dysfunction 10/23/2020   Gastroesophageal reflux disease without esophagitis 02/09/2022   Generalized anxiety disorder 01/01/2020   Hyperglycemia 09/24/2020   Hyperlipidemia 12/15/2018   Major depressive disorder 01/01/2020    Mild cognitive impairment of uncertain or unknown etiology 01/21/2021   Mild mitral regurgitation 08/15/2013   Nephrolithiasis 06/22/2016   Obstructive sleep apnea 04/13/2015   no CPAP   Orthostatic lightheadedness 10/23/2020   Primary osteoarthritis of both hips 07/30/2019   Pure hypercholesterolemia 12/15/2018   RBBB (right bundle branch block) 12/03/2012   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  GEN:  The patient appears stated age and is in NAD. HEENT:  Normocephalic, atraumatic.   Neurological examination:  General: NAD, well-groomed, appears stated age. Orientation: The patient is alert. Oriented to person, place and  to date.*** Cranial nerves: There is good facial symmetry.The speech is fluent and clear. No aphasia or dysarthria. Fund of knowledge is appropriate. Recent memory impaired and remote memory is normal.  Attention and concentration are normal.  Able to name objects and repeat phrases.  Hearing is intact to conversational tone ***.   Delayed recall *** Sensation: Sensation is intact to light touch throughout Motor: Strength is at least antigravity x4. DTR's 2/4 in UE/LE       No data to display             01/11/2022    9:00 AM 01/05/2022    1:29 PM 10/28/2021   12:03 PM  MMSE - Mini Mental State Exam  Orientation to time 5 4 5   Orientation to Place 5 5 4   Registration 3 3 3   Attention/ Calculation 5 5 5   Recall 2 3 1   Language- name 2 objects 2 2 2   Language- repeat 1 1 1   Language- follow 3 step command 3 2 2   Language- read & follow direction 1 1 1   Write a sentence 1 1 1   Copy design 0 1 1  Total score 28 28 26       Movement examination: Tone: There is normal tone in the UE/LE.  No  cogwheeling Abnormal movements:  no tremor noted today.  No myoclonus.  No asterixis.   Coordination:  There is no decremation with RAM's. Normal finger to nose  Gait and Station: The patient has no difficulty arising out of a deep-seated chair without the use of the hands. The patient's stride length is good.  Gait is cautious and narrow.   Thank you for allowing us  the opportunity to participate in the care of this nice patient. Please do not hesitate to contact us  for  any questions or concerns.   Total time spent on today's visit was *** minutes dedicated to this patient today, preparing to see patient, examining the patient, ordering tests and/or medications and counseling the patient, documenting clinical information in the EHR or other health record, independently interpreting results and communicating results to the patient/family, discussing treatment and goals, answering patient's questions and coordinating care.  Cc:  Haze Kingfisher, MD  Camie Sevin 03/09/2024 5:37 AM      "

## 2024-03-12 ENCOUNTER — Ambulatory Visit: Payer: Medicare Other | Admitting: Physician Assistant

## 2024-04-10 ENCOUNTER — Inpatient Hospital Stay

## 2024-04-10 ENCOUNTER — Inpatient Hospital Stay: Attending: Hematology and Oncology

## 2024-07-12 ENCOUNTER — Inpatient Hospital Stay: Attending: Hematology and Oncology | Admitting: Hematology and Oncology

## 2024-07-12 ENCOUNTER — Inpatient Hospital Stay
